# Patient Record
Sex: Male | Born: 1990 | Race: Black or African American | Hispanic: No | Marital: Single | Smoking: Current every day smoker
Health system: Southern US, Community
[De-identification: ages and names within clinical notes are randomized; demographics above are authoritative.]

## PROBLEM LIST (undated history)

## (undated) DIAGNOSIS — J45909 Unspecified asthma, uncomplicated: Secondary | ICD-10-CM

## (undated) DIAGNOSIS — I219 Acute myocardial infarction, unspecified: Secondary | ICD-10-CM

## (undated) DIAGNOSIS — Z21 Asymptomatic human immunodeficiency virus [HIV] infection status: Secondary | ICD-10-CM

## (undated) DIAGNOSIS — N186 End stage renal disease: Secondary | ICD-10-CM

## (undated) DIAGNOSIS — I770 Arteriovenous fistula, acquired: Secondary | ICD-10-CM

## (undated) DIAGNOSIS — A4101 Sepsis due to Methicillin susceptible Staphylococcus aureus: Secondary | ICD-10-CM

## (undated) DIAGNOSIS — B20 Human immunodeficiency virus [HIV] disease: Secondary | ICD-10-CM

## (undated) DIAGNOSIS — I519 Heart disease, unspecified: Secondary | ICD-10-CM

## (undated) HISTORY — PX: PORT A CATH REVISION: SHX6033

---

## 2013-01-08 ENCOUNTER — Emergency Department (HOSPITAL_COMMUNITY)
Admission: EM | Admit: 2013-01-08 | Discharge: 2013-01-08 | Disposition: A | Discharge status: Home / Self Care | Payer: PRIVATE HEALTH INSURANCE | Attending: Emergency Medicine | Admitting: Emergency Medicine

## 2013-01-08 ENCOUNTER — Encounter (HOSPITAL_COMMUNITY): Payer: Self-pay | Admitting: *Deleted

## 2013-01-08 DIAGNOSIS — Z21 Asymptomatic human immunodeficiency virus [HIV] infection status: Secondary | ICD-10-CM

## 2013-01-08 DIAGNOSIS — H60399 Other infective otitis externa, unspecified ear: Secondary | ICD-10-CM | POA: Insufficient documentation

## 2013-01-08 DIAGNOSIS — B20 Human immunodeficiency virus [HIV] disease: Secondary | ICD-10-CM

## 2013-01-08 DIAGNOSIS — F17200 Nicotine dependence, unspecified, uncomplicated: Secondary | ICD-10-CM | POA: Insufficient documentation

## 2013-01-08 DIAGNOSIS — R21 Rash and other nonspecific skin eruption: Secondary | ICD-10-CM

## 2013-01-08 DIAGNOSIS — H6091 Unspecified otitis externa, right ear: Secondary | ICD-10-CM

## 2013-01-08 HISTORY — DX: Asymptomatic human immunodeficiency virus (hiv) infection status: Z21

## 2013-01-08 HISTORY — DX: Human immunodeficiency virus (HIV) disease: B20

## 2013-01-08 MED ORDER — PREDNISONE 20 MG PO TABS: ORAL_TABLET | ORAL | Status: DC

## 2013-01-08 MED ORDER — IBUPROFEN 800 MG PO TABS
800.0000 mg | ORAL_TABLET | Freq: Once | ORAL | Status: AC
  Administered 2013-01-08: 800 mg via ORAL
  Filled 2013-01-08: qty 1

## 2013-01-08 MED ORDER — ACETAMINOPHEN 500 MG PO TABS
1000.0000 mg | ORAL_TABLET | Freq: Once | ORAL | Status: AC
  Administered 2013-01-08: 1000 mg via ORAL
  Filled 2013-01-08: qty 2

## 2013-01-08 MED ORDER — METHYLPREDNISOLONE SODIUM SUCC 125 MG IJ SOLR
125.0000 mg | Freq: Once | INTRAMUSCULAR | Status: AC
  Administered 2013-01-08: 125 mg via INTRAMUSCULAR
  Filled 2013-01-08: qty 2

## 2013-01-08 MED ORDER — TRAMADOL HCL 50 MG PO TABS: 50.0000 mg | ORAL_TABLET | Freq: Four times a day (QID) | ORAL | Status: DC | PRN

## 2013-01-08 MED ORDER — NEOMYCIN-POLYMYXIN-HC 3.5-10000-1 OT SUSP: 4.0000 [drp] | Freq: Four times a day (QID) | OTIC | Status: DC

## 2013-01-08 NOTE — ED Notes (Signed)
Rash over body for the past 2 months, states it is painful

## 2013-01-08 NOTE — ED Notes (Signed)
Pt gave permission for this nurse to make staff at [facility] facility ([facility])  Aware of his HIV infection and that pt would need to be isolated from other prisoners etc, and that staff would need to use personal protection and good handwashing etc.  Dave Estrada was informed of same and requested that we would call the nurse in the morning to inform of same.  Nurse to be contacted is Ms. Edwards at [phone]

## 2013-01-08 NOTE — ED Provider Notes (Addendum)
History     This chart was scribed for Donnetta Hutching, MD, MD by Smitty Pluck, ED Scribe. The patient was seen in room APA04/APA04 and the patient's care was started at 7:26 PM.   CSN: [id number]  Arrival date & time 01/08/13  1844      Chief Complaint  Patient presents with  . Rash    The history is provided by the patient. No language interpreter was used.   Dave Estrada is a 22 y.o. male with hx of in utero HIV who presents to the Emergency Department [facility]IB correctional office from [facility] facility complaining of constant, moderate rash over entire body onset 3 months ago. Symptoms are worsening. He states that he has not taken his HIV medication in a long time (2 years) due to not having insurance and he has not informed facility that he has HIV. Pt states the rash persistently itches and hurts. Pt denies fever, chills, nausea, vomiting, diarrhea, weakness, cough, SOB and any other pain.   History reviewed. No pertinent past medical history.  History reviewed. No pertinent past surgical history.  No family history on file.  History  Substance Use Topics  . Smoking status: Current Every Day Smoker -- 1.00 packs/day  . Smokeless tobacco: Not on file  . Alcohol Use: No      Review of Systems 10 Systems reviewed and all are negative for acute change except as noted in the HPI.   Allergies  Review of patient's allergies indicates not on file.  Home Medications  No current outpatient prescriptions on file.  BP 150/95  Pulse 100  Temp(Src) 97.7 F (36.5 C) (Oral)  Resp 24  Ht 5' 5.5" (1.664 m)  Wt 120 lb (54.432 kg)  BMI 19.66 kg/m2  SpO2 96%  Physical Exam  Nursing note and vitals reviewed. Constitutional: He is oriented to person, place, and time. He appears well-developed and well-nourished.  HENT:  Head: Normocephalic and atraumatic.  Right external canal erythematous and  Inflamed  Eyes: Conjunctivae and EOM are normal. Pupils are equal,  round, and reactive to light.  Neck: Normal range of motion. Neck supple.  Cardiovascular: Normal rate, regular rhythm and normal heart sounds.   Pulmonary/Chest: Effort normal and breath sounds normal.  Abdominal: Soft. Bowel sounds are normal.  Musculoskeletal: Normal range of motion.  Neurological: He is alert and oriented to person, place, and time.  Skin: Skin is warm.   Diffuse erythematous papular plaque with each lesion is 1-10 mm in diameter over entire body  Psychiatric: He has a normal mood and affect.    ED Course  Procedures (including critical care time) DIAGNOSTIC STUDIES: Oxygen Saturation is 96% on room air, adequate by my interpretation.    COORDINATION OF CARE: 7:30 PM Discussed ED treatment with pt and pt agrees.     Labs Reviewed - No data to display No results found.   No diagnosis found.    MDM  Concern for Kaposi's sarcoma.  Discussed with infectious disease consultant at [facility].  Follow up number given.   Prescription for prednisone.  Also Rx for tramadol #30 and Cortisporin Otic for right ear      I personally performed the services described in this documentation, which was scribed in my presence. The recorded information has been reviewed and is accurate.    Donnetta Hutching, MD 01/08/13 4098  Donnetta Hutching, MD 01/08/13 2233

## 2013-01-09 NOTE — ED Notes (Signed)
RN spoke with Marybelle Killings RN at [facility] staff are aware and pt has been placed in isolation.

## 2013-01-09 NOTE — ED Notes (Signed)
Unable to contact nurse Edwards at [phone] (disconnected). Message left with communications [phone] for jail nurse to contact charge nurse at [phone].

## 2014-09-08 ENCOUNTER — Emergency Department (HOSPITAL_COMMUNITY): Payer: Medicaid Other

## 2014-09-08 ENCOUNTER — Inpatient Hospital Stay (HOSPITAL_COMMUNITY)
Admission: EM | Admit: 2014-09-08 | Discharge: 2014-09-15 | DRG: 974 | Disposition: A | Discharge status: Home / Self Care | Payer: Medicaid Other | Attending: Internal Medicine | Admitting: Internal Medicine

## 2014-09-08 ENCOUNTER — Encounter (HOSPITAL_COMMUNITY): Payer: Self-pay | Admitting: Emergency Medicine

## 2014-09-08 DIAGNOSIS — F1290 Cannabis use, unspecified, uncomplicated: Secondary | ICD-10-CM | POA: Diagnosis present

## 2014-09-08 DIAGNOSIS — D649 Anemia, unspecified: Secondary | ICD-10-CM | POA: Diagnosis not present

## 2014-09-08 DIAGNOSIS — A4102 Sepsis due to Methicillin resistant Staphylococcus aureus: Secondary | ICD-10-CM | POA: Diagnosis present

## 2014-09-08 DIAGNOSIS — Z21 Asymptomatic human immunodeficiency virus [HIV] infection status: Secondary | ICD-10-CM | POA: Diagnosis present

## 2014-09-08 DIAGNOSIS — A4101 Sepsis due to Methicillin susceptible Staphylococcus aureus: Secondary | ICD-10-CM | POA: Diagnosis present

## 2014-09-08 DIAGNOSIS — N186 End stage renal disease: Secondary | ICD-10-CM | POA: Diagnosis not present

## 2014-09-08 DIAGNOSIS — Z79899 Other long term (current) drug therapy: Secondary | ICD-10-CM | POA: Diagnosis not present

## 2014-09-08 DIAGNOSIS — B9562 Methicillin resistant Staphylococcus aureus infection as the cause of diseases classified elsewhere: Secondary | ICD-10-CM

## 2014-09-08 DIAGNOSIS — R509 Fever, unspecified: Secondary | ICD-10-CM

## 2014-09-08 DIAGNOSIS — D539 Nutritional anemia, unspecified: Secondary | ICD-10-CM | POA: Diagnosis present

## 2014-09-08 DIAGNOSIS — N185 Chronic kidney disease, stage 5: Secondary | ICD-10-CM | POA: Diagnosis not present

## 2014-09-08 DIAGNOSIS — I38 Endocarditis, valve unspecified: Secondary | ICD-10-CM | POA: Diagnosis present

## 2014-09-08 DIAGNOSIS — Z8614 Personal history of Methicillin resistant Staphylococcus aureus infection: Secondary | ICD-10-CM

## 2014-09-08 DIAGNOSIS — B999 Unspecified infectious disease: Secondary | ICD-10-CM

## 2014-09-08 DIAGNOSIS — I369 Nonrheumatic tricuspid valve disorder, unspecified: Secondary | ICD-10-CM | POA: Diagnosis not present

## 2014-09-08 DIAGNOSIS — N189 Chronic kidney disease, unspecified: Secondary | ICD-10-CM

## 2014-09-08 DIAGNOSIS — I120 Hypertensive chronic kidney disease with stage 5 chronic kidney disease or end stage renal disease: Secondary | ICD-10-CM | POA: Diagnosis present

## 2014-09-08 DIAGNOSIS — J45909 Unspecified asthma, uncomplicated: Secondary | ICD-10-CM | POA: Diagnosis present

## 2014-09-08 DIAGNOSIS — F17210 Nicotine dependence, cigarettes, uncomplicated: Secondary | ICD-10-CM | POA: Diagnosis present

## 2014-09-08 DIAGNOSIS — R160 Hepatomegaly, not elsewhere classified: Secondary | ICD-10-CM

## 2014-09-08 DIAGNOSIS — R7881 Bacteremia: Secondary | ICD-10-CM

## 2014-09-08 DIAGNOSIS — K769 Liver disease, unspecified: Secondary | ICD-10-CM | POA: Diagnosis present

## 2014-09-08 DIAGNOSIS — Z888 Allergy status to other drugs, medicaments and biological substances status: Secondary | ICD-10-CM

## 2014-09-08 DIAGNOSIS — Z992 Dependence on renal dialysis: Secondary | ICD-10-CM | POA: Diagnosis not present

## 2014-09-08 DIAGNOSIS — I340 Nonrheumatic mitral (valve) insufficiency: Secondary | ICD-10-CM | POA: Diagnosis not present

## 2014-09-08 DIAGNOSIS — B20 Human immunodeficiency virus [HIV] disease: Secondary | ICD-10-CM | POA: Diagnosis not present

## 2014-09-08 DIAGNOSIS — I10 Essential (primary) hypertension: Secondary | ICD-10-CM | POA: Diagnosis not present

## 2014-09-08 HISTORY — DX: End stage renal disease: N18.6

## 2014-09-08 LAB — URINALYSIS, ROUTINE W REFLEX MICROSCOPIC
Bilirubin Urine: NEGATIVE
Glucose, UA: NEGATIVE mg/dL
KETONES UR: NEGATIVE mg/dL
LEUKOCYTES UA: NEGATIVE
NITRITE: NEGATIVE
PH: 6.5 (ref 5.0–8.0)
PROTEIN: 100 mg/dL — AB
Specific Gravity, Urine: 1.01 (ref 1.005–1.030)
UROBILINOGEN UA: 0.2 mg/dL (ref 0.0–1.0)

## 2014-09-08 LAB — COMPREHENSIVE METABOLIC PANEL
ALT: 9 U/L (ref 0–53)
ANION GAP: 16 — AB (ref 5–15)
AST: 20 U/L (ref 0–37)
Albumin: 3.6 g/dL (ref 3.5–5.2)
Alkaline Phosphatase: 106 U/L (ref 39–117)
BUN: 30 mg/dL — ABNORMAL HIGH (ref 6–23)
CALCIUM: 8.5 mg/dL (ref 8.4–10.5)
CO2: 26 meq/L (ref 19–32)
CREATININE: 7.68 mg/dL — AB (ref 0.50–1.35)
Chloride: 93 mEq/L — ABNORMAL LOW (ref 96–112)
GFR calc Af Amer: 10 mL/min — ABNORMAL LOW (ref >90)
GFR, EST NON AFRICAN AMERICAN: 9 mL/min — AB (ref >90)
Glucose, Bld: 123 mg/dL — ABNORMAL HIGH (ref 70–99)
Potassium: 3.7 mEq/L (ref 3.7–5.3)
Sodium: 135 mEq/L — ABNORMAL LOW (ref 137–147)
Total Bilirubin: 1 mg/dL (ref 0.3–1.2)
Total Protein: 8.3 g/dL (ref 6.0–8.3)

## 2014-09-08 LAB — CBC WITH DIFFERENTIAL/PLATELET
BASOS ABS: 0 10*3/uL (ref 0.0–0.1)
BASOS PCT: 0 % (ref 0–1)
EOS PCT: 1 % (ref 0–5)
Eosinophils Absolute: 0 10*3/uL (ref 0.0–0.7)
HEMATOCRIT: 28.7 % — AB (ref 39.0–52.0)
Hemoglobin: 9.9 g/dL — ABNORMAL LOW (ref 13.0–17.0)
LYMPHS PCT: 7 % — AB (ref 12–46)
Lymphs Abs: 0.6 10*3/uL — ABNORMAL LOW (ref 0.7–4.0)
MCH: 35.2 pg — ABNORMAL HIGH (ref 26.0–34.0)
MCHC: 34.5 g/dL (ref 30.0–36.0)
MCV: 102.1 fL — AB (ref 78.0–100.0)
MONO ABS: 0.5 10*3/uL (ref 0.1–1.0)
Monocytes Relative: 7 % (ref 3–12)
Neutro Abs: 7.2 10*3/uL (ref 1.7–7.7)
Neutrophils Relative %: 85 % — ABNORMAL HIGH (ref 43–77)
Platelets: 129 10*3/uL — ABNORMAL LOW (ref 150–400)
RBC: 2.81 MIL/uL — ABNORMAL LOW (ref 4.22–5.81)
RDW: 14.4 % (ref 11.5–15.5)
WBC: 8.3 10*3/uL (ref 4.0–10.5)

## 2014-09-08 LAB — URINE MICROSCOPIC-ADD ON

## 2014-09-08 LAB — LACTIC ACID, PLASMA: LACTIC ACID, VENOUS: 1.3 mmol/L (ref 0.5–2.2)

## 2014-09-08 MED ORDER — ENOXAPARIN SODIUM 40 MG/0.4ML ~~LOC~~ SOLN: 40.0000 mg | SUBCUTANEOUS | Status: DC

## 2014-09-08 MED ORDER — SODIUM CHLORIDE 0.9 % IV BOLUS (SEPSIS)
500.0000 mL | Freq: Once | INTRAVENOUS | Status: AC
  Administered 2014-09-08: 500 mL via INTRAVENOUS

## 2014-09-08 MED ORDER — ACETAMINOPHEN 650 MG RE SUPP: 650.0000 mg | Freq: Four times a day (QID) | RECTAL | Status: DC | PRN

## 2014-09-08 MED ORDER — TENOFOVIR DISOPROXIL FUMARATE 300 MG PO TABS
300.0000 mg | ORAL_TABLET | ORAL | Status: DC
  Filled 2014-09-08: qty 1

## 2014-09-08 MED ORDER — ETRAVIRINE 100 MG PO TABS
200.0000 mg | ORAL_TABLET | Freq: Two times a day (BID) | ORAL | Status: DC
  Administered 2014-09-08 – 2014-09-12 (×8): 200 mg via ORAL
  Filled 2014-09-08 (×16): qty 2

## 2014-09-08 MED ORDER — RALTEGRAVIR POTASSIUM 400 MG PO TABS
400.0000 mg | ORAL_TABLET | Freq: Two times a day (BID) | ORAL | Status: DC
  Administered 2014-09-08 – 2014-09-13 (×11): 400 mg via ORAL
  Filled 2014-09-08 (×18): qty 1

## 2014-09-08 MED ORDER — SODIUM CHLORIDE 0.9 % IV SOLN
INTRAVENOUS | Status: DC
  Administered 2014-09-08 – 2014-09-09 (×2): via INTRAVENOUS

## 2014-09-08 MED ORDER — EMTRICITABINE 200 MG PO CAPS
200.0000 mg | ORAL_CAPSULE | ORAL | Status: DC
  Administered 2014-09-11: 200 mg via ORAL
  Filled 2014-09-08 (×3): qty 1

## 2014-09-08 MED ORDER — PIPERACILLIN-TAZOBACTAM 3.375 G IVPB 30 MIN
3.3750 g | Freq: Once | INTRAVENOUS | Status: AC
  Administered 2014-09-08: 3.375 g via INTRAVENOUS
  Filled 2014-09-08: qty 50

## 2014-09-08 MED ORDER — PIPERACILLIN-TAZOBACTAM IN DEX 2-0.25 GM/50ML IV SOLN
INTRAVENOUS | Status: AC
  Filled 2014-09-08: qty 50

## 2014-09-08 MED ORDER — PIPERACILLIN-TAZOBACTAM IN DEX 2-0.25 GM/50ML IV SOLN
2.2500 g | Freq: Three times a day (TID) | INTRAVENOUS | Status: DC
  Administered 2014-09-09 – 2014-09-10 (×4): 2.25 g via INTRAVENOUS
  Filled 2014-09-08 (×8): qty 50

## 2014-09-08 MED ORDER — IBUPROFEN 400 MG PO TABS
400.0000 mg | ORAL_TABLET | Freq: Once | ORAL | Status: AC
  Administered 2014-09-08: 400 mg via ORAL
  Filled 2014-09-08: qty 1

## 2014-09-08 MED ORDER — ACETAMINOPHEN 325 MG PO TABS
325.0000 mg | ORAL_TABLET | Freq: Once | ORAL | Status: AC
  Administered 2014-09-08: 325 mg via ORAL
  Filled 2014-09-08: qty 1

## 2014-09-08 MED ORDER — ACETAMINOPHEN 325 MG PO TABS
650.0000 mg | ORAL_TABLET | Freq: Once | ORAL | Status: AC
  Administered 2014-09-08: 650 mg via ORAL
  Filled 2014-09-08: qty 2

## 2014-09-08 MED ORDER — VANCOMYCIN HCL IN DEXTROSE 1-5 GM/200ML-% IV SOLN
1000.0000 mg | Freq: Once | INTRAVENOUS | Status: AC
  Administered 2014-09-08: 1000 mg via INTRAVENOUS
  Filled 2014-09-08: qty 200

## 2014-09-08 MED ORDER — ONDANSETRON HCL 4 MG/2ML IJ SOLN: 4.0000 mg | Freq: Four times a day (QID) | INTRAMUSCULAR | Status: DC | PRN

## 2014-09-08 MED ORDER — ACETAMINOPHEN 325 MG PO TABS
650.0000 mg | ORAL_TABLET | Freq: Four times a day (QID) | ORAL | Status: DC | PRN
  Administered 2014-09-09: 650 mg via ORAL
  Filled 2014-09-08: qty 2

## 2014-09-08 MED ORDER — HEPARIN SODIUM (PORCINE) 5000 UNIT/ML IJ SOLN
5000.0000 [IU] | Freq: Three times a day (TID) | INTRAMUSCULAR | Status: DC
  Filled 2014-09-08 (×14): qty 1

## 2014-09-08 MED ORDER — ONDANSETRON HCL 4 MG PO TABS: 4.0000 mg | ORAL_TABLET | Freq: Four times a day (QID) | ORAL | Status: DC | PRN

## 2014-09-08 NOTE — ED Provider Notes (Signed)
CSN: [id number]     Arrival date & time 09/08/14  1502 History   First MD Initiated Contact with Patient 09/08/14 1552     Chief Complaint  Patient presents with  . Fever     (Consider location/radiation/quality/duration/timing/severity/associated sxs/prior Treatment) HPI  This is a 23 year old male with history of HIV, end-stage renal disease on dialysis Tuesday, Thursday, and Saturday in the [facility] who presents with fever. Patient reports a 2 day history of fever. He states that at dialysis's fever was 103. He last dialyzed this morning. He did not receive a full dialysis session and only got 2 hours worth. He reports associated cough, myalgias, and headache. He denies any neck pain or stiffness. He denies any abdominal pain, vomiting, or diarrhea. He states that his dad has had a cough. He has been taking over-the-counter NyQuil, TheraFlu, and Tylenol with minimal relief. He dialyzes through a tunneled catheter.  Past Medical History  Diagnosis Date  . HIV (human immunodeficiency virus infection)   . Renal disorder    Past Surgical History  Procedure Laterality Date  . Port a cath     Family History  Problem Relation Age of Onset  . Seizures Father    History  Substance Use Topics  . Smoking status: Current Every Day Smoker -- 1.00 packs/day for 11 years    Types: Cigarettes  . Smokeless tobacco: Never Used  . Alcohol Use: No    Review of Systems  Constitutional: Positive for fever and chills.  Respiratory: Positive for cough. Negative for chest tightness and shortness of breath.   Cardiovascular: Negative.  Negative for chest pain.  Gastrointestinal: Negative.  Negative for nausea, vomiting, abdominal pain and diarrhea.  Genitourinary: Negative.  Negative for dysuria.  Musculoskeletal: Positive for myalgias. Negative for neck pain and neck stiffness.  Skin: Negative for rash.  Neurological: Negative for headaches.  All other systems reviewed and are  negative.     Allergies  Aspirin  Home Medications   Prior to Admission medications   Medication Sig Start Date End Date Taking? Authorizing Provider  emtricitabine (EMTRIVA) 200 MG capsule Take 200 mg by mouth 2 (two) times a week. Taken after dialysis on Mondays and Fridays   Yes Historical Provider, MD  etravirine (INTELENCE) 100 MG tablet Take 200 mg by mouth every 12 (twelve) hours.   Yes Historical Provider, MD  raltegravir (ISENTRESS) 400 MG tablet Take 400 mg by mouth 2 (two) times daily.   Yes Historical Provider, MD  tenofovir (VIREAD) 300 MG tablet Take 300 mg by mouth every Monday. To be taken on Mondays after dialysis   Yes Historical Provider, MD  predniSONE (DELTASONE) 20 MG tablet 3 tabs po daily x 3 days, then 2 tabs x 3 days, then 1.5 tabs x 3 days, then 1 tab x 3 days, then 0.5 tabs x 3 days Patient not taking: Reported on 09/08/2014 01/08/13   Donnetta Hutching, MD  predniSONE (DELTASONE) 20 MG tablet 3 tabs po daily x 3 days, then 2 tabs x 3 days, then 1.5 tabs x 3 days, then 1 tab x 3 days, then 0.5 tabs x 3 days Patient not taking: Reported on 09/08/2014 01/08/13   Donnetta Hutching, MD  traMADol (ULTRAM) 50 MG tablet Take 1 tablet (50 mg total) by mouth every 6 (six) hours as needed for pain. Patient not taking: Reported on 09/08/2014 01/08/13   Donnetta Hutching, MD   BP 116/69 mmHg  Pulse 129  Temp(Src) 99.4 F (37.4 C) (Oral)  Resp 19  Ht 5\' 5"  (1.651 m)  Wt 140 lb (63.504 kg)  BMI 23.30 kg/m2  SpO2 98% Physical Exam  Constitutional: He is oriented to person, place, and time. No distress.  Chronically ill-appearing, no acute distress  HENT:  Head: Normocephalic and atraumatic.  Mucous membranes dry  Eyes: Pupils are equal, round, and reactive to light.  Neck: Normal range of motion. Neck supple.  No meningismus  Cardiovascular: Regular rhythm and normal heart sounds.   No murmur heard. Tachycardia  Pulmonary/Chest: Effort normal and breath sounds normal. No respiratory  distress. He has no wheezes. He exhibits no tenderness.  Tunneled catheter in right upper chest, clean dry and intact without erythema  Abdominal: Soft. Bowel sounds are normal. There is no tenderness. There is no rebound.  Musculoskeletal: He exhibits no edema.  Lymphadenopathy:    He has no cervical adenopathy.  Neurological: He is alert and oriented to person, place, and time.  Skin: Skin is warm and dry.  Psychiatric: He has a normal mood and affect.  Nursing note and vitals reviewed.   ED Course  Procedures (including critical care time)  CRITICAL CARE Performed by: Ross MarcusHORTON, COURTNEY, F   Total critical care time: 35 min  Critical care time was exclusive of separately billable procedures and treating other patients.  Critical care was necessary to treat or prevent imminent or life-threatening deterioration.  Critical care was time spent personally by me on the following activities: development of treatment plan with patient and/or surrogate as well as nursing, discussions with consultants, evaluation of patient's response to treatment, examination of patient, obtaining history from patient or surrogate, ordering and performing treatments and interventions, ordering and review of laboratory studies, ordering and review of radiographic studies, pulse oximetry and re-evaluation of patient's condition.  Labs Review Labs Reviewed  CBC WITH DIFFERENTIAL - Abnormal; Notable for the following:    RBC 2.81 (*)    Hemoglobin 9.9 (*)    HCT 28.7 (*)    MCV 102.1 (*)    MCH 35.2 (*)    Platelets 129 (*)    Neutrophils Relative % 85 (*)    Lymphocytes Relative 7 (*)    Lymphs Abs 0.6 (*)    All other components within normal limits  COMPREHENSIVE METABOLIC PANEL - Abnormal; Notable for the following:    Sodium 135 (*)    Chloride 93 (*)    Glucose, Bld 123 (*)    BUN 30 (*)    Creatinine, Ser 7.68 (*)    GFR calc non Af Amer 9 (*)    GFR calc Af Amer 10 (*)    Anion gap 16 (*)     All other components within normal limits  URINALYSIS, ROUTINE W REFLEX MICROSCOPIC - Abnormal; Notable for the following:    Hgb urine dipstick SMALL (*)    Protein, ur 100 (*)    All other components within normal limits  URINE MICROSCOPIC-ADD ON - Abnormal; Notable for the following:    Squamous Epithelial / LPF FEW (*)    Bacteria, UA FEW (*)    Casts GRANULAR CAST (*)    All other components within normal limits  URINE CULTURE  CULTURE, BLOOD (ROUTINE X 2)  CULTURE, BLOOD (ROUTINE X 2)  LACTIC ACID, PLASMA  INFLUENZA PANEL BY PCR (TYPE A & B, H1N1)    Imaging Review Dg Chest 2 View  09/08/2014   CLINICAL DATA:  Fever for 2 days with cough  EXAM: CHEST  2  VIEW  COMPARISON:  None.  FINDINGS: Cardiac shadow is within normal limits. A right jugular dialysis catheter is seen in satisfactory position. The lungs are clear bilaterally. No effusion is noted. No acute bony abnormality is seen.  IMPRESSION: No acute abnormality noted.   Electronically Signed   By: Alcide Clever M.D.   On: 09/08/2014 17:13   Ct Head Wo Contrast  09/08/2014   CLINICAL DATA:  Headaches and fevers for 2 days  EXAM: CT HEAD WITHOUT CONTRAST  TECHNIQUE: Contiguous axial images were obtained from the base of the skull through the vertex without intravenous contrast.  COMPARISON:  None.  FINDINGS: Bony calvarium is intact. The paranasal sinuses show partial opacification in the maxillary antra as well as some thickening of the nasal passages on the left. May be related to acute sinusitis. No findings to suggest acute hemorrhage, acute infarction or space-occupying mass lesion are noted.  IMPRESSION: Changes in the maxillary antra and left sinus passages likely related to a degree of sinusitis. No other focal abnormality is noted.   Electronically Signed   By: Alcide Clever M.D.   On: 09/08/2014 18:16     EKG Interpretation None      MDM   Final diagnoses:  Fever  AIDS  End stage renal disease    Patient  presents with fever. History of AIDS and end-stage renal disease. Only reports cough, myalgias.  Tachycardic and febrile on exam. Otherwise vital signs are reassuring. Patient is nontoxic-appearing. Workup does not readily indicate a source. Cultures pending. Chest x-ray is clear and urinalysis does not look overtly infected. Tunneled catheter site no evidence of obvious infection. Patient was given Tylenol and Motrin with some improvement of his fever. He is remained tachycardic. He was given 500 mL of fluid given that he was on dialysis. Lactate is normal.  Patient is followed by ID at [facility], Dr. Janora Norlander.  If patient needs admission, patient prefers to be admitted here. Discussed with ID attending at [facility] workup and any further workup.  At this time given that the patient is awake, alert, oriented, not altered, LP does not need to be performed. Will admit patient for observation pending cultures. Will initiate broad-spectrum antibiotics including vancomycin and Zosyn. Influenza PCR sent.    Shon Baton, MD 09/08/14 Ernestina Columbia

## 2014-09-08 NOTE — ED Notes (Signed)
Patient c/o fever x2 days. Patient reports taking Theraflu and tylenol with no relief. Per patient highest temp 103. Patient denies any nausea, vomiting, or diarrhea. Does report occasional cough. Patient is a dialysis patient.

## 2014-09-08 NOTE — H&P (Signed)
PCP:   No primary care provider on file.   Chief Complaint:  Fever  HPI:  23 year old male who  has a past medical history of HIV (human immunodeficiency virus infection) and Renal disorder. Patient has end-stage renal disease on analysis Tuesday Thursday and Saturday. Patient presented today history of fever associated with nasal congestion, cough. Fever was high as 103. Patient was dialyzed this morning. He also has been having myalgias and headache. He denies neck pain. No nausea vomiting or diarrhea. Patient has been coughing up white colored phlegm. In the ED patient was found to have fever of 102, no other source of infection found chest x-ray is clear UA does not show abnormality. Patient also denies dysuria urgency or frequency of urination.  Allergies:   Allergies  Allergen Reactions  . Aspirin Other (See Comments)    Reaction is unknown      Past Medical History  Diagnosis Date  . HIV (human immunodeficiency virus infection)   . Renal disorder     Past Surgical History  Procedure Laterality Date  . Port a cath      Prior to Admission medications   Medication Sig Start Date End Date Taking? Authorizing Provider  emtricitabine (EMTRIVA) 200 MG capsule Take 200 mg by mouth 2 (two) times a week. Taken after dialysis on Mondays and Fridays   Yes Historical Provider, MD  etravirine (INTELENCE) 100 MG tablet Take 200 mg by mouth every 12 (twelve) hours.   Yes Historical Provider, MD  raltegravir (ISENTRESS) 400 MG tablet Take 400 mg by mouth 2 (two) times daily.   Yes Historical Provider, MD  tenofovir (VIREAD) 300 MG tablet Take 300 mg by mouth every Monday. To be taken on Mondays after dialysis   Yes Historical Provider, MD  predniSONE (DELTASONE) 20 MG tablet 3 tabs po daily x 3 days, then 2 tabs x 3 days, then 1.5 tabs x 3 days, then 1 tab x 3 days, then 0.5 tabs x 3 days Patient not taking: Reported on 09/08/2014 01/08/13   Donnetta Hutching, MD  predniSONE (DELTASONE) 20  MG tablet 3 tabs po daily x 3 days, then 2 tabs x 3 days, then 1.5 tabs x 3 days, then 1 tab x 3 days, then 0.5 tabs x 3 days Patient not taking: Reported on 09/08/2014 01/08/13   Donnetta Hutching, MD  traMADol (ULTRAM) 50 MG tablet Take 1 tablet (50 mg total) by mouth every 6 (six) hours as needed for pain. Patient not taking: Reported on 09/08/2014 01/08/13   Donnetta Hutching, MD    Social History:  reports that he has been smoking Cigarettes.  He has a 11 pack-year smoking history. He has never used smokeless tobacco. He reports that he uses illicit drugs (Marijuana). He reports that he does not drink alcohol.  Family History  Problem Relation Age of Onset  . Seizures Father      All the positives are listed in BOLD  Review of Systems:  HEENT: Headache, blurred vision, runny nose, sore throat Neck: Hypothyroidism, hyperthyroidism,,lymphadenopathy Chest : Shortness of breath, history of COPD, Asthma, cough Heart : Chest pain, history of coronary arterey disease GI:  Nausea, vomiting, diarrhea, constipation, GERD GU: Dysuria, urgency, frequency of urination, hematuria Neuro: Stroke, seizures, syncope Psych: Depression, anxiety, hallucinations   Physical Exam: Blood pressure 105/73, pulse 113, temperature 99.4 F (37.4 C), temperature source Oral, resp. rate 21, height 5\' 5"  (1.651 m), weight 63.504 kg (140 lb), SpO2 100 %. Constitutional:   Patient  is a well-developed and well-nourished male in no acute distress and cooperative with exam. Head: Normocephalic and atraumatic Mouth: Mucus membranes moist Eyes: PERRL, EOMI, conjunctivae normal Neck: Supple, No Thyromegaly Cardiovascular: RRR, S1 normal, S2 normal Pulmonary/Chest: CTAB, no wheezes, rales, or rhonchi Abdominal: Soft. Non-tender, non-distended, bowel sounds are normal, no masses, organomegaly, or guarding present.  Neurological: A&O x3, Strength is normal and symmetric bilaterally, cranial nerve II-XII are grossly intact, no focal  motor deficit, sensory intact to light touch bilaterally.  Extremities : No Cyanosis, Clubbing or Edema  Labs on Admission:  Basic Metabolic Panel:  Recent Labs Lab 09/08/14 1553  NA 135*  K 3.7  CL 93*  CO2 26  GLUCOSE 123*  BUN 30*  CREATININE 7.68*  CALCIUM 8.5   Liver Function Tests:  Recent Labs Lab 09/08/14 1553  AST 20  ALT 9  ALKPHOS 106  BILITOT 1.0  PROT 8.3  ALBUMIN 3.6   No results for input(s): LIPASE, AMYLASE in the last 168 hours. No results for input(s): AMMONIA in the last 168 hours. CBC:  Recent Labs Lab 09/08/14 1553  WBC 8.3  NEUTROABS 7.2  HGB 9.9*  HCT 28.7*  MCV 102.1*  PLT 129*   Cardiac Enzymes: No results for input(s): CKTOTAL, CKMB, CKMBINDEX, TROPONINI in the last 168 hours.  BNP (last 3 results) No results for input(s): PROBNP in the last 8760 hours. CBG: No results for input(s): GLUCAP in the last 168 hours.  Radiological Exams on Admission: Dg Chest 2 View  09/08/2014   CLINICAL DATA:  Fever for 2 days with cough  EXAM: CHEST  2 VIEW  COMPARISON:  None.  FINDINGS: Cardiac shadow is within normal limits. A right jugular dialysis catheter is seen in satisfactory position. The lungs are clear bilaterally. No effusion is noted. No acute bony abnormality is seen.  IMPRESSION: No acute abnormality noted.   Electronically Signed   By: Alcide Clever M.D.   On: 09/08/2014 17:13   Ct Head Wo Contrast  09/08/2014   CLINICAL DATA:  Headaches and fevers for 2 days  EXAM: CT HEAD WITHOUT CONTRAST  TECHNIQUE: Contiguous axial images were obtained from the base of the skull through the vertex without intravenous contrast.  COMPARISON:  None.  FINDINGS: Bony calvarium is intact. The paranasal sinuses show partial opacification in the maxillary antra as well as some thickening of the nasal passages on the left. May be related to acute sinusitis. No findings to suggest acute hemorrhage, acute infarction or space-occupying mass lesion are noted.   IMPRESSION: Changes in the maxillary antra and left sinus passages likely related to a degree of sinusitis. No other focal abnormality is noted.   Electronically Signed   By: Alcide Clever M.D.   On: 09/08/2014 18:16     Assessment/Plan Active Problems:   Fever   HIV (human immunodeficiency virus infection)   ESRD on hemodialysis  Fever Will admit the patient in telemetry. Start vancomycin and Zosyn per pharmacy consultation. Blood cultures and urine cultures have been obtained. If cultures are negative consider switching antibiotics to oral formulation. Considering upper respiratory symptoms it appears that patient may have upper respiratory viral infection.  ESRD on hemodialysis We'll consult nephrology in the morning for dialysis. Patient is on dialysis Tuesday Thursday and Saturday.  HIV Patient is followed by infectious disease at [facility]. Continue antiretroviral therapy.  DVT prophylaxis Lovenox  Code status: Full code  Family discussion: Admission, patients condition and plan of care including tests being  ordered have been discussed with the patient and *his wife at bedside* who indicate understanding and agree with the plan and Code Status.   Time Spent on Admission: 60 minutes  Venesa Semidey S [facility] Pager: [phone] 09/08/2014, 8:19 PM  If 7PM-7AM, please contact night-coverage  [email or web address]  Password [email or web address]

## 2014-09-08 NOTE — Progress Notes (Signed)
ANTIBIOTIC CONSULT NOTE  Pharmacy Consult for Vancomycin & Zosyn Indication: sepsis  Allergies  Allergen Reactions  . Aspirin Other (See Comments)    Reaction is unknown    Patient Measurements: Height: 5\' 5"  (165.1 cm) Weight: 140 lb (63.504 kg) IBW/kg (Calculated) : 61.5  Vital Signs: Temp: 99.4 F (37.4 C) (11/24 1851) Temp Source: Oral (11/24 1851) BP: 111/67 mmHg (11/24 2030) Pulse Rate: 107 (11/24 2030) Intake/Output from previous day:   Intake/Output from this shift:    Labs:  Recent Labs  09/08/14 1553  WBC 8.3  HGB 9.9*  PLT 129*  CREATININE 7.68*   Estimated Creatinine Clearance: 12.8 mL/min (by C-G formula based on Cr of 7.68). No results for input(s): VANCOTROUGH, VANCOPEAK, VANCORANDOM, GENTTROUGH, GENTPEAK, GENTRANDOM, TOBRATROUGH, TOBRAPEAK, TOBRARND, AMIKACINPEAK, AMIKACINTROU, AMIKACIN in the last 72 hours.   Microbiology: No results found for this or any previous visit (from the past 720 hour(s)).  Anti-infectives    Start     Dose/Rate Route Frequency Ordered Stop   09/08/14 1915  vancomycin (VANCOCIN) IVPB 1000 mg/200 mL premix     1,000 mg200 mL/hr over 60 Minutes Intravenous  Once 09/08/14 1911     09/08/14 1915  piperacillin-tazobactam (ZOSYN) IVPB 3.375 g     3.375 g100 mL/hr over 30 Minutes Intravenous  Once 09/08/14 1911 09/08/14 1951      Assessment: 23 yoM with hx HIV and ESRD requiring HD on TTS presented today with fever (Tm 103F at dialysis this morning).  Urinalysis & CXR negative.  WBC and lactic acid level are normal.   Broad-spectrum antibiotics were initiated in ED for respiratory symptoms in immunocompromised patient.   Vancomycin 11/24>> Zosyn 11/24>>  Goal of Therapy:  Pre-HD level 15-25 mcg/ml  Plan:  Zosyn 2.25gm IV q8h Vancomycin 750mg  IV qHD Check pre-HD level at steady-state F/U cx data Duration of therapy per MD- de-escalate as appropriate  Elson Clan 09/08/2014,8:49 PM

## 2014-09-09 DIAGNOSIS — I369 Nonrheumatic tricuspid valve disorder, unspecified: Secondary | ICD-10-CM

## 2014-09-09 DIAGNOSIS — A4101 Sepsis due to Methicillin susceptible Staphylococcus aureus: Secondary | ICD-10-CM

## 2014-09-09 HISTORY — DX: Sepsis due to methicillin susceptible Staphylococcus aureus: A41.01

## 2014-09-09 LAB — CBC
HEMATOCRIT: 27.6 % — AB (ref 39.0–52.0)
Hemoglobin: 9.4 g/dL — ABNORMAL LOW (ref 13.0–17.0)
MCH: 34.9 pg — AB (ref 26.0–34.0)
MCHC: 34.1 g/dL (ref 30.0–36.0)
MCV: 102.6 fL — ABNORMAL HIGH (ref 78.0–100.0)
Platelets: 126 10*3/uL — ABNORMAL LOW (ref 150–400)
RBC: 2.69 MIL/uL — ABNORMAL LOW (ref 4.22–5.81)
RDW: 14.8 % (ref 11.5–15.5)
WBC: 6 10*3/uL (ref 4.0–10.5)

## 2014-09-09 LAB — COMPREHENSIVE METABOLIC PANEL
ALBUMIN: 3.2 g/dL — AB (ref 3.5–5.2)
ALT: 11 U/L (ref 0–53)
AST: 19 U/L (ref 0–37)
Alkaline Phosphatase: 99 U/L (ref 39–117)
Anion gap: 14 (ref 5–15)
BUN: 35 mg/dL — AB (ref 6–23)
CALCIUM: 7.8 mg/dL — AB (ref 8.4–10.5)
CO2: 25 mEq/L (ref 19–32)
Chloride: 99 mEq/L (ref 96–112)
Creatinine, Ser: 8.41 mg/dL — ABNORMAL HIGH (ref 0.50–1.35)
GFR calc non Af Amer: 8 mL/min — ABNORMAL LOW (ref >90)
GFR, EST AFRICAN AMERICAN: 9 mL/min — AB (ref >90)
GLUCOSE: 97 mg/dL (ref 70–99)
Potassium: 4 mEq/L (ref 3.7–5.3)
Sodium: 138 mEq/L (ref 137–147)
TOTAL PROTEIN: 7.5 g/dL (ref 6.0–8.3)
Total Bilirubin: 0.9 mg/dL (ref 0.3–1.2)

## 2014-09-09 LAB — INFLUENZA PANEL BY PCR (TYPE A & B)
H1N1 flu by pcr: NOT DETECTED
Influenza A By PCR: NEGATIVE
Influenza B By PCR: NEGATIVE

## 2014-09-09 LAB — URINE CULTURE: Colony Count: 30000

## 2014-09-09 MED ORDER — VANCOMYCIN HCL IN DEXTROSE 750-5 MG/150ML-% IV SOLN
750.0000 mg | INTRAVENOUS | Status: DC
  Administered 2014-09-10 – 2014-09-12 (×2): 750 mg via INTRAVENOUS
  Filled 2014-09-09 (×7): qty 150

## 2014-09-09 MED ORDER — EPOETIN ALFA 4000 UNIT/ML IJ SOLN
4000.0000 [IU] | INTRAMUSCULAR | Status: DC
Start: 2014-09-10 — End: 2014-09-10

## 2014-09-09 NOTE — Progress Notes (Signed)
Patient's HR increases to 140's while ambulating.  Patient is asymptomatic.  Notified MD.  Will continue to monitor.

## 2014-09-09 NOTE — Care Management Note (Signed)
    Page 1 of 1   09/09/2014     10:51:19 AM CARE MANAGEMENT NOTE 09/09/2014  Patient:  Dave Estrada, Dave Estrada   Account Number:  [id number]  Date Initiated:  09/09/2014  Documentation initiated by:  Kathyrn Sheriff  Subjective/Objective Assessment:   Pt from home.     Action/Plan:   Plan to return home at discharge. No CM needs identified at this time.   Anticipated DC Date:  09/12/2014   Anticipated DC Plan:  HOME/SELF CARE      DC Planning Services  CM consult      Choice offered to / List presented to:             Status of service:  Completed, signed off Medicare Important Message given?   (If response is "NO", the following Medicare IM given date fields will be blank) Date Medicare IM given:   Medicare IM given by:   Date Additional Medicare IM given:   Additional Medicare IM given by:    Discharge Disposition:  HOME/SELF CARE  Per UR Regulation:    If discussed at Long Length of Stay Meetings, dates discussed:    Comments:  09/09/2014 1050 Kathyrn Sheriff, RN, MSN, [facility]

## 2014-09-09 NOTE — Clinical Documentation Improvement (Signed)
Please clarify if HIV positive can be further specified as one of the diagnoses listed below and document in pn or d/c summary.   Possible Clinical Conditions?  AIDS HIV Disease AIDS Related Complex HIV (+) unsymptomatic Symptomatic HIV  Other Condition Cannot Clinically Determine  Supporting Information: Risk Factors: Sepsis, HIV positive, ESRD, dialysis, Fever  Supporting Information: Risk Factors: Signs & Symptoms: Diagnostics:  Treatment  raltegravir   etravirine   emtricitabine   tenofovir  Thank You, Enis Slipper ,RN Clinical Documentation Specialist:  [phone]  [facility]- Health Information Management

## 2014-09-09 NOTE — Progress Notes (Signed)
Patient's VTE is heparin. He refused and was educated. Bertram Denver D, RN

## 2014-09-09 NOTE — Progress Notes (Signed)
  Echocardiogram 2D Echocardiogram has been performed.  Lajune Perine 09/09/2014, 3:16 PM

## 2014-09-09 NOTE — Care Management Utilization Note (Signed)
UR review complete.  

## 2014-09-09 NOTE — Consult Note (Signed)
Dave Estrada MRN: [id number] DOB/AGE: 1990/12/01 23 y.o. Primary Care Physician:No primary care provider on file. Admit date: 09/08/2014 Chief Complaint:  Chief Complaint  Patient presents with  . Fever   HPI: Pt is 23 year old male with past medical hx of ESRD who presented to er yesterday with c/o fever.  HPI dates back to 2-3 days ago when pt started having cough, productive with whitish phlegm. Pt  c/o fever which is now better Pt also having myalgia and headaches, now better. Pt presented to ER and was admitted for further care. Pt seen today at 3rd floor. NO c/o chest pain No c/o dyspnea  no c/o nausea/ vomiting No c/o abdominal pain No c/o syncope No c/o recent travel Pt last Dialysis was yesterday.   Past Medical History  Diagnosis Date  . HIV (human immunodeficiency virus infection)   . Renal disorder         Family History  Problem Relation Age of Onset  . Seizures Father     Social History:  reports that he has been smoking Cigarettes.  He has a 11 pack-year smoking history. He has never used smokeless tobacco. He reports that he uses illicit drugs (Marijuana). He reports that he does not drink alcohol.  Allergies:  Allergies  Allergen Reactions  . Aspirin Other (See Comments)    Reaction is unknown    Medications Prior to Admission  Medication Sig Dispense Refill  . emtricitabine (EMTRIVA) 200 MG capsule Take 200 mg by mouth 2 (two) times a week. Taken after dialysis on Mondays and Fridays    . etravirine (INTELENCE) 100 MG tablet Take 200 mg by mouth every 12 (twelve) hours.    . raltegravir (ISENTRESS) 400 MG tablet Take 400 mg by mouth 2 (two) times daily.    Marland Kitchen tenofovir (VIREAD) 300 MG tablet Take 300 mg by mouth every Monday. To be taken on Mondays after dialysis    . predniSONE (DELTASONE) 20 MG tablet 3 tabs po daily x 3 days, then 2 tabs x 3 days, then 1.5 tabs x 3 days, then 1 tab x 3 days, then 0.5 tabs x 3 days (Patient not taking:  Reported on 09/08/2014) 27 tablet 0  . predniSONE (DELTASONE) 20 MG tablet 3 tabs po daily x 3 days, then 2 tabs x 3 days, then 1.5 tabs x 3 days, then 1 tab x 3 days, then 0.5 tabs x 3 days (Patient not taking: Reported on 09/08/2014) 27 tablet 0  . traMADol (ULTRAM) 50 MG tablet Take 1 tablet (50 mg total) by mouth every 6 (six) hours as needed for pain. (Patient not taking: Reported on 09/08/2014) 30 tablet 0       UVO:ZDGUY from the symptoms mentioned above,there are no other symptoms referable to all systems reviewed.  Melene Muller ON 09/11/2014] emtricitabine  200 mg Oral Once per day on Mon Fri  . etravirine  200 mg Oral Q12H  . heparin subcutaneous  5,000 Units Subcutaneous 3 times per day  . piperacillin-tazobactam (ZOSYN)  IV  2.25 g Intravenous Q8H  . raltegravir  400 mg Oral BID  . [START ON 09/14/2014] tenofovir  300 mg Oral Q Mon     Physical Exam: Vital signs in last 24 hours: Temp:  [98 F (36.7 C)-101.8 F (38.8 C)] 98 F (36.7 C) (11/25 4034) Pulse Rate:  [96-140] 96 (11/25 0614) Resp:  [15-26] 21 (11/25 0614) BP: (104-130)/(56-78) 130/78 mmHg (11/25 0614) SpO2:  [96 %-100 %] 100 % (  11/25 4628) Weight:  [140 lb 6.4 oz (63.685 kg)-140 lb 10.5 oz (63.8 kg)] 140 lb 10.5 oz (63.8 kg) (11/25 6381) Weight change:  Last BM Date: 09/08/14  Intake/Output from previous day: 11/24 0701 - 11/25 0700 In: 395 [I.V.:345; IV Piggyback:50] Out: -  Total I/O In: 450 [I.V.:400; IV Piggyback:50] Out: -    Physical Exam: General- pt is awake,alert, oriented to time place and person Resp- No acute REsp distress, CTA B/L NO Rhonchi CVS- S1S2 regular in rate and rhythm GIT- BS+, soft, NT, ND EXT- NO LE Edema, Cyanosis CNS- CN 2-12 grossly intact. Moving all 4 extremities Psych- normal mood and affect Access- PC    Lab Results: CBC  Recent Labs  09/08/14 1553 09/09/14 0626  WBC 8.3 6.0  HGB 9.9* 9.4*  HCT 28.7* 27.6*  PLT 129* 126*    BMET  Recent Labs   09/08/14 1553 09/09/14 0626  NA 135* 138  K 3.7 4.0  CL 93* 99  CO2 26 25  GLUCOSE 123* 97  BUN 30* 35*  CREATININE 7.68* 8.41*  CALCIUM 8.5 7.8*    MICRO Recent Results (from the past 240 hour(s))  Blood culture (routine x 2)     Status: None (Preliminary result)   Collection Time: 09/08/14  4:30 PM  Result Value Ref Range Status   Specimen Description BLOOD RIGHT HAND  Final   Special Requests   Final    BOTTLES DRAWN AEROBIC AND ANAEROBIC AEB=10CC ANA=8CC   Culture  Setup Time   Final    09/09/2014 14:51 Performed at [facility]    Culture   Final    GRAM POSITIVE COCCI IN CLUSTERS Note: Gram Stain Report Called to,Read Back By and Verified With: [name] [phone] BY HUFFINES 09/09/14 Performed at [facility] Performed at [facility]    Report Status PENDING  Incomplete  Blood culture (routine x 2)     Status: None (Preliminary result)   Collection Time: 09/08/14  4:38 PM  Result Value Ref Range Status   Specimen Description BLOOD LEFT HAND  Final   Special Requests   Final    BOTTLES DRAWN AEROBIC AND ANAEROBIC AEB=14CC ANA=10CC   Culture  Setup Time   Final    09/09/2014 14:46 Performed at [facility]    Culture   Final    GRAM POSITIVE COCCI IN CLUSTERS Note: Gram Stain Report Called to,Read Back By and Verified With: [facility] S AT 0727 BY HUFFINES S 09/09/14 Performed at [facility] Performed at [facility]    Report Status PENDING  Incomplete      Lab Results  Component Value Date   CALCIUM 7.8* 09/09/2014      Impression: 1)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               On TTs schedule                No need of Hd today                Will dialyze in am                On PC                  Multiple AVF failed to mature  2)HTN BP stable   3)Anemia HGb at goal (9--11)   4)CKD Mineral-Bone Disorder Calcium 7.8 + 0.6=8.4  Near to goal when corrected for low albumin  5)ID- hx of HIV Admitted with  fevr Blood culture positive for G Positive cocci Final id pending Pt will ned PC replacement ( most likely ) On IV vanco + Zosyn  6)Electrolytes  Normokalemic NOrmonatremic  7)Acid base Co2 at goal     Plan:  NO need of Hd today Will dilayze in am Pt may ned pC exchange will keep on epo during HD       Yavuz Kirby S 09/09/2014, 3:44 PM

## 2014-09-09 NOTE — Plan of Care (Signed)
Problem: Phase I Progression Outcomes Goal: Voiding-avoid urinary catheter unless indicated Outcome: Completed/Met Date Met:  09/09/14

## 2014-09-09 NOTE — Progress Notes (Signed)
[facility] PROGRESS NOTE  Dave Estrada XBJ:[id number] DOB: [birth year] DOA: 09/08/2014 PCP: No primary care provider on file.  Assessment/Plan: 1. Gram pos bacteremia with sepsis  1. Thus far gm pos in clusters 2/2 in blood with gm pos in urine 2. Question HD cath as source, as this has been in place for over a year 3. No longer septic and pt reports feeling better today with IV abx. Will cont for now 4. Will order 2d echo for vegetations 5. Pending speciation and sensitivities of cultures 6. Consider HD cath removal - will defer to nephrology 2. ESRD on TTS HD 1. Nephrology consulted 3. HIV 1. [facility] 4. DVT prophylaxis 1. lovenox  Code Status: Full Family Communication: Pt in room Disposition Plan: Pending   Consultants:  Nephrology  Procedures:    Antibiotics:  Vancomycin 11/24>>>  Zosyn 11/24>>>  HPI/Subjective: Feels better today. Eager to go home.  Objective: Filed Vitals:   09/08/14 2030 09/08/14 2100 09/08/14 2222 09/09/14 0614  BP: 111/67 104/63 118/63 130/78  Pulse: 107 105 97 96  Temp:   98.4 F (36.9 C) 98 F (36.7 C)  TempSrc:   Oral Oral  Resp: 26 25 20 21   Height:   5\' 5"  (1.651 m)   Weight:   63.685 kg (140 lb 6.4 oz) 63.8 kg (140 lb 10.5 oz)  SpO2: 98% 99% 100% 100%    Intake/Output Summary (Last 24 hours) at 09/09/14 1237 Last data filed at 09/09/14 0500  Gross per 24 hour  Intake    395 ml  Output      0 ml  Net    395 ml   Filed Weights   09/08/14 1510 09/08/14 2222 09/09/14 0614  Weight: 63.504 kg (140 lb) 63.685 kg (140 lb 6.4 oz) 63.8 kg (140 lb 10.5 oz)    Exam:   General:  Awake, in nad  Cardiovascular: regular, s1, s2  Respiratory: normal resp effort, no wheezing  Abdomen: soft, nondistended  Musculoskeletal: perfused, no clubbing   Data Reviewed: Basic Metabolic Panel:  Recent Labs Lab 09/08/14 1553 09/09/14 0626  NA 135* 138  K 3.7 4.0  CL 93* 99  CO2 26 25  GLUCOSE 123* 97   BUN 30* 35*  CREATININE 7.68* 8.41*  CALCIUM 8.5 7.8*   Liver Function Tests:  Recent Labs Lab 09/08/14 1553 09/09/14 0626  AST 20 19  ALT 9 11  ALKPHOS 106 99  BILITOT 1.0 0.9  PROT 8.3 7.5  ALBUMIN 3.6 3.2*   No results for input(s): LIPASE, AMYLASE in the last 168 hours. No results for input(s): AMMONIA in the last 168 hours. CBC:  Recent Labs Lab 09/08/14 1553 09/09/14 0626  WBC 8.3 6.0  NEUTROABS 7.2  --   HGB 9.9* 9.4*  HCT 28.7* 27.6*  MCV 102.1* 102.6*  PLT 129* 126*   Cardiac Enzymes: No results for input(s): CKTOTAL, CKMB, CKMBINDEX, TROPONINI in the last 168 hours. BNP (last 3 results) No results for input(s): PROBNP in the last 8760 hours. CBG: No results for input(s): GLUCAP in the last 168 hours.  Recent Results (from the past 240 hour(s))  Blood culture (routine x 2)     Status: None (Preliminary result)   Collection Time: 09/08/14  4:30 PM  Result Value Ref Range Status   Specimen Description BLOOD RIGHT HAND  Final   Special Requests   Final    BOTTLES DRAWN AEROBIC AND ANAEROBIC AEB=10CC ANA=8CC   Culture   Final  GRAM POSITIVE COCCI IN CLUSTERS Gram Stain Report Called to,Read Back By and Verified With: MCKINNEY,S  AT 0727 BY HUFFINES,S ON 09/09/14 Performed at [facility]    Report Status PENDING  Incomplete  Blood culture (routine x 2)     Status: None (Preliminary result)   Collection Time: 09/08/14  4:38 PM  Result Value Ref Range Status   Specimen Description BLOOD LEFT HAND  Final   Special Requests   Final    BOTTLES DRAWN AEROBIC AND ANAEROBIC AEB=14CC ANA=10CC   Culture   Final    GRAM POSITIVE COCCI IN CLUSTERS Gram Stain Report Called to,Read Back By and Verified With: MCKINNEY,S AT 0727 BY HUFFINES,S ON 09/09/14  Performed at [facility]    Report Status PENDING  Incomplete     Studies: Dg Chest 2 View  09/08/2014   CLINICAL DATA:  Fever for 2 days with cough  EXAM: CHEST  2 VIEW  COMPARISON:   None.  FINDINGS: Cardiac shadow is within normal limits. A right jugular dialysis catheter is seen in satisfactory position. The lungs are clear bilaterally. No effusion is noted. No acute bony abnormality is seen.  IMPRESSION: No acute abnormality noted.   Electronically Signed   By: Alcide Clever M.D.   On: 09/08/2014 17:13   Ct Head Wo Contrast  09/08/2014   CLINICAL DATA:  Headaches and fevers for 2 days  EXAM: CT HEAD WITHOUT CONTRAST  TECHNIQUE: Contiguous axial images were obtained from the base of the skull through the vertex without intravenous contrast.  COMPARISON:  None.  FINDINGS: Bony calvarium is intact. The paranasal sinuses show partial opacification in the maxillary antra as well as some thickening of the nasal passages on the left. May be related to acute sinusitis. No findings to suggest acute hemorrhage, acute infarction or space-occupying mass lesion are noted.  IMPRESSION: Changes in the maxillary antra and left sinus passages likely related to a degree of sinusitis. No other focal abnormality is noted.   Electronically Signed   By: Alcide Clever M.D.   On: 09/08/2014 18:16    Scheduled Meds: . [START ON 09/11/2014] emtricitabine  200 mg Oral Once per day on Mon Fri  . etravirine  200 mg Oral Q12H  . heparin subcutaneous  5,000 Units Subcutaneous 3 times per day  . piperacillin-tazobactam (ZOSYN)  IV  2.25 g Intravenous Q8H  . raltegravir  400 mg Oral BID  . [START ON 09/14/2014] tenofovir  300 mg Oral Q Mon   Continuous Infusions: . sodium chloride 50 mL/hr at 09/08/14 2206    Active Problems:   Fever   HIV (human immunodeficiency virus infection)   ESRD on hemodialysis  Time spent:  Daily Doe K  [facility] Pager [phone]. If 7PM-7AM, please contact night-coverage at [email or web address], password [facility] 09/09/2014, 12:37 PM  LOS: 1 day

## 2014-09-10 ENCOUNTER — Inpatient Hospital Stay (HOSPITAL_COMMUNITY): Payer: Medicaid Other

## 2014-09-10 DIAGNOSIS — K769 Liver disease, unspecified: Secondary | ICD-10-CM | POA: Diagnosis present

## 2014-09-10 DIAGNOSIS — F17210 Nicotine dependence, cigarettes, uncomplicated: Secondary | ICD-10-CM | POA: Diagnosis present

## 2014-09-10 DIAGNOSIS — J45909 Unspecified asthma, uncomplicated: Secondary | ICD-10-CM | POA: Diagnosis present

## 2014-09-10 DIAGNOSIS — Z8614 Personal history of Methicillin resistant Staphylococcus aureus infection: Secondary | ICD-10-CM | POA: Diagnosis present

## 2014-09-10 DIAGNOSIS — K7689 Other specified diseases of liver: Secondary | ICD-10-CM

## 2014-09-10 DIAGNOSIS — D539 Nutritional anemia, unspecified: Secondary | ICD-10-CM

## 2014-09-10 DIAGNOSIS — R7881 Bacteremia: Secondary | ICD-10-CM

## 2014-09-10 LAB — BASIC METABOLIC PANEL
ANION GAP: 13 (ref 5–15)
BUN: 36 mg/dL — ABNORMAL HIGH (ref 6–23)
CALCIUM: 7.9 mg/dL — AB (ref 8.4–10.5)
CO2: 22 mEq/L (ref 19–32)
Chloride: 101 mEq/L (ref 96–112)
Creatinine, Ser: 8.54 mg/dL — ABNORMAL HIGH (ref 0.50–1.35)
GFR, EST AFRICAN AMERICAN: 9 mL/min — AB (ref >90)
GFR, EST NON AFRICAN AMERICAN: 8 mL/min — AB (ref >90)
GLUCOSE: 102 mg/dL — AB (ref 70–99)
POTASSIUM: 4.2 meq/L (ref 3.7–5.3)
Sodium: 136 mEq/L — ABNORMAL LOW (ref 137–147)

## 2014-09-10 LAB — CBC
HCT: 22.5 % — ABNORMAL LOW (ref 39.0–52.0)
Hemoglobin: 7.6 g/dL — ABNORMAL LOW (ref 13.0–17.0)
MCH: 35 pg — AB (ref 26.0–34.0)
MCHC: 33.8 g/dL (ref 30.0–36.0)
MCV: 103.7 fL — ABNORMAL HIGH (ref 78.0–100.0)
PLATELETS: 146 10*3/uL — AB (ref 150–400)
RBC: 2.17 MIL/uL — ABNORMAL LOW (ref 4.22–5.81)
RDW: 13.6 % (ref 11.5–15.5)
WBC: 7.5 10*3/uL (ref 4.0–10.5)

## 2014-09-10 LAB — HEPATITIS B SURFACE ANTIGEN: HEP B S AG: NEGATIVE

## 2014-09-10 MED ORDER — PENTAFLUOROPROP-TETRAFLUOROETH EX AERO
1.0000 "application " | INHALATION_SPRAY | CUTANEOUS | Status: DC | PRN
  Filled 2014-09-10: qty 30

## 2014-09-10 MED ORDER — SODIUM CHLORIDE 0.9 % IV SOLN: 100.0000 mL | INTRAVENOUS | Status: DC | PRN

## 2014-09-10 MED ORDER — HEPARIN SODIUM (PORCINE) 1000 UNIT/ML IJ SOLN
INTRAMUSCULAR | Status: AC
  Administered 2014-09-10: 1300 [IU] via INTRAVENOUS_CENTRAL
  Filled 2014-09-10: qty 2

## 2014-09-10 MED ORDER — HEPARIN SODIUM (PORCINE) 1000 UNIT/ML DIALYSIS
3800.0000 [IU] | INTRAMUSCULAR | Status: DC | PRN
  Administered 2014-09-10: 3800 [IU] via INTRAVENOUS_CENTRAL
  Filled 2014-09-10 (×2): qty 4

## 2014-09-10 MED ORDER — EPOETIN ALFA 10000 UNIT/ML IJ SOLN
INTRAMUSCULAR | Status: AC
  Administered 2014-09-10: 10000 [IU] via INTRAVENOUS
  Filled 2014-09-10: qty 1

## 2014-09-10 MED ORDER — SODIUM CHLORIDE 0.9 % IJ SOLN
INTRAMUSCULAR | Status: AC
  Administered 2014-09-10: 10 mL via INTRAVENOUS
  Filled 2014-09-10: qty 6

## 2014-09-10 MED ORDER — CEFAZOLIN SODIUM-DEXTROSE 2-3 GM-% IV SOLR
2.0000 g | Freq: Three times a day (TID) | INTRAVENOUS | Status: DC
  Filled 2014-09-10 (×3): qty 50

## 2014-09-10 MED ORDER — CEFAZOLIN SODIUM-DEXTROSE 2-3 GM-% IV SOLR
2.0000 g | INTRAVENOUS | Status: DC
  Administered 2014-09-12: 2 g via INTRAVENOUS
  Filled 2014-09-10 (×2): qty 50

## 2014-09-10 MED ORDER — LIDOCAINE-PRILOCAINE 2.5-2.5 % EX CREA: 1.0000 "application " | TOPICAL_CREAM | CUTANEOUS | Status: DC | PRN

## 2014-09-10 MED ORDER — HEPARIN SODIUM (PORCINE) 1000 UNIT/ML DIALYSIS
20.0000 [IU]/kg | INTRAMUSCULAR | Status: DC | PRN
  Administered 2014-09-10: 1300 [IU] via INTRAVENOUS_CENTRAL
  Filled 2014-09-10 (×2): qty 2

## 2014-09-10 MED ORDER — EPOETIN ALFA 10000 UNIT/ML IJ SOLN
10000.0000 [IU] | INTRAMUSCULAR | Status: DC
  Administered 2014-09-10: 10000 [IU] via INTRAVENOUS
  Filled 2014-09-10 (×2): qty 1
  Filled 2014-09-10: qty 3
  Filled 2014-09-10 (×2): qty 1

## 2014-09-10 MED ORDER — ALTEPLASE 2 MG IJ SOLR
2.0000 mg | Freq: Once | INTRAMUSCULAR | Status: AC | PRN
  Filled 2014-09-10: qty 2

## 2014-09-10 MED ORDER — CEFAZOLIN SODIUM-DEXTROSE 2-3 GM-% IV SOLR
2.0000 g | Freq: Once | INTRAVENOUS | Status: AC
  Administered 2014-09-10: 2 g via INTRAVENOUS
  Filled 2014-09-10: qty 50

## 2014-09-10 MED ORDER — SODIUM CHLORIDE 0.9 % IJ SOLN
10.0000 mL | INTRAMUSCULAR | Status: DC | PRN
  Administered 2014-09-10 (×2): 10 mL via INTRAVENOUS
  Filled 2014-09-10 (×2): qty 10

## 2014-09-10 MED ORDER — HEPARIN SODIUM (PORCINE) 1000 UNIT/ML IJ SOLN
INTRAMUSCULAR | Status: AC
  Administered 2014-09-10: 3800 [IU] via INTRAVENOUS_CENTRAL
  Filled 2014-09-10: qty 4

## 2014-09-10 MED ORDER — NEPRO/CARBSTEADY PO LIQD: 237.0000 mL | ORAL | Status: DC | PRN

## 2014-09-10 MED ORDER — HEPARIN SODIUM (PORCINE) 1000 UNIT/ML DIALYSIS
1000.0000 [IU] | INTRAMUSCULAR | Status: DC | PRN
  Filled 2014-09-10: qty 1

## 2014-09-10 MED ORDER — LIDOCAINE HCL (PF) 1 % IJ SOLN: 5.0000 mL | INTRAMUSCULAR | Status: DC | PRN

## 2014-09-10 NOTE — Consult Note (Signed)
[facility]    Date of Admission:  09/08/2014           Day 3 vancomycin        Day 3 piperacillin tazobactam       Reason for Consult: Automatic consultation for staph aureus bacteremia      Principal Problem:   Staphylococcus aureus bacteremia with sepsis Active Problems:   Liver lesion   History of MRSA infection   HIV (human immunodeficiency virus infection)   ESRD on hemodialysis   Macrocytic anemia   Cigarette smoker   Asthma, chronic   . [START ON 09/11/2014] emtricitabine  200 mg Oral Once per day on Mon Fri  . epoetin alfa  10,000 Units Intravenous Q T,Th,Sa-HD  . etravirine  200 mg Oral Q12H  . heparin subcutaneous  5,000 Units Subcutaneous 3 times per day  . piperacillin-tazobactam (ZOSYN)  IV  2.25 g Intravenous Q8H  . raltegravir  400 mg Oral BID  . sodium chloride      . [START ON 09/14/2014] tenofovir  300 mg Oral Q Mon  . vancomycin  750 mg Intravenous Q T,Th,Sa-HD    Recommendations: 1. Continue vancomycin pending final susceptibility results 2. Narrow piperacillin tazobactam to cefazolin pending final susceptibility results  3. Repeat blood cultures 4. Recommend TEE 5. Recommend removing current hemodialysis catheter soon; do not replace until repeat blood cultures negative 6. Recommend liver ultrasound to evaluate lesion seen on TTE 7. 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 It may very well be the same MRSA that he was infected with in August but I would continue vancomycin along with cefazolin to provide optimal coverage for both MRSA and MSSA pending final susceptibility results. He needs repeat blood cultures today. Her guidelines recommend hemodialysis catheter removal and replacement once blood cultures are negative. He should also undergo TEE to rule out endocarditis. A small lesion was noted in his liver on the TTE. I agree with obtaining a  liver ultrasound to look for any evidence of liver abscess.  He has perinatal HIV infection. He was off of therapy for several years until restarting in mid 2014. He has good viral suppression on his current salvage regimen and he is having steady CD4 reconstitution.    HPI: Dave Estrada is a 23 y.o. 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 He was admitted 3 days ago with fever and both admission blood cultures are growing staph aureus. He was hospitalized at [facility] in August and 3 of 3 blood cultures were positive for MRSA. His infectious disease note from his routine clinic visit at [facility] in early September, and that he had been treated with IV antibiotics and then changed to oral antibiotics. Apparently there was concern for catheter infection but his catheter was not removed.   Review of Systems: Review of systems not obtained due to patient factors.  Past Medical History  Diagnosis Date  . HIV (human immunodeficiency virus infection)   . Renal disorder     History  Substance Use Topics  . Smoking status: Current Every Day Smoker -- 1.00 packs/day for 11 years    Types: Cigarettes  . Smokeless tobacco: Never Used  . Alcohol Use: No    Family History  Problem Relation Age of Onset  . Seizures Father    Allergies  Allergen Reactions  .  Aspirin Other (See Comments)    Reaction is unknown    OBJECTIVE: Blood pressure 143/81, pulse 96, temperature 98.2 F (36.8 C), temperature source Oral, resp. rate 16, height 5\' 5"  (1.651 m), weight 140 lb 6.9 oz (63.7 kg), SpO2 99 %.  Lab Results Lab Results  Component Value Date   WBC 7.5 09/10/2014   HGB 7.6* 09/10/2014   HCT 22.5* 09/10/2014   MCV 103.7* 09/10/2014   PLT 146* 09/10/2014    Lab Results  Component Value Date   CREATININE 8.54* 09/10/2014   BUN 36* 09/10/2014   NA 136* 09/10/2014   K 4.2 09/10/2014   CL 101  09/10/2014   CO2 22 09/10/2014    Lab Results  Component Value Date   ALT 11 09/09/2014   AST 19 09/09/2014   ALKPHOS 99 09/09/2014   BILITOT 0.9 09/09/2014    [facility]: 179 [facility]: Less than 20  Microbiology: Recent Results (from the past 240 hour(s))  Blood culture (routine x 2)     Status: None (Preliminary result)   Collection Time: 09/08/14  4:30 PM  Result Value Ref Range Status   Specimen Description BLOOD RIGHT HAND  Final   Special Requests   Final    BOTTLES DRAWN AEROBIC AND ANAEROBIC AEB=10CC ANA=8CC   Culture  Setup Time   Final    09/09/2014 14:51 Performed at [facility]    Culture   Final    STAPHYLOCOCCUS AUREUS Note: Gram Stain Report Called to,Read Back By and Verified With: Loralie ChampagneMCKINNEY S 0727 BY HUFFINES 09/09/14 Performed at [facility] Performed at [facility]    Report Status PENDING  Incomplete  Blood culture (routine x 2)     Status: None (Preliminary result)   Collection Time: 09/08/14  4:38 PM  Result Value Ref Range Status   Specimen Description BLOOD LEFT HAND  Final   Special Requests   Final    BOTTLES DRAWN AEROBIC AND ANAEROBIC AEB=14CC ANA=10CC   Culture  Setup Time   Final    09/09/2014 14:46 Performed at [facility]    Culture   Final    STAPHYLOCOCCUS AUREUS Note: RIFAMPIN AND GENTAMICIN SHOULD NOT BE USED AS SINGLE DRUGS FOR TREATMENT OF STAPH INFECTIONS. Note: Gram Stain Report Called to,Read Back By and Verified With: [facility] AT 0727 BY HUFFINES S 09/09/14 Performed at [facility] Performed at [facility]    Report Status PENDING  Incomplete  Urine culture     Status: None   Collection Time: 09/08/14  5:25 PM  Result Value Ref Range Status   Specimen Description URINE, CLEAN CATCH  Final   Special Requests NONE  Final   Culture  Setup Time   Final    09/08/2014 23:18 Performed at [facility]    Colony Count   Final     30,000 COLONIES/ML Performed at [facility]    Culture   Final    Multiple bacterial morphotypes present, none predominant. Suggest appropriate recollection if clinically indicated. Performed at [facility]    Report Status 09/09/2014 FINAL  Final    Cliffton AstersJohn Abisai Deer, MD [facility] [phone] pager   [phone] cell 09/10/2014, 12:59 PM

## 2014-09-10 NOTE — Progress Notes (Signed)
Subjective: Interval History: has no complaint of nausea or vomiting. Patient also denies any difficulty in breathing. Presently he is feeling better..  Objective: Vital signs in last 24 hours: Temp:  [98.2 F (36.8 C)-100.1 F (37.8 C)] 99.2 F (37.3 C) (11/26 0701) Pulse Rate:  [90-108] 90 (11/26 0701) Resp:  [20] 20 (11/26 0701) BP: (120-129)/(65-68) 129/66 mmHg (11/26 0701) SpO2:  [100 %] 100 % (11/26 0701) Weight:  [63.458 kg (139 lb 14.4 oz)] 63.458 kg (139 lb 14.4 oz) (11/26 0701) Weight change: -0.045 kg (-1.6 oz)  Intake/Output from previous day: 11/25 0701 - 11/26 0700 In: 1890 [P.O.:480; I.V.:1260; IV Piggyback:150] Out: -  Intake/Output this shift:    General appearance: alert, cooperative and no distress Resp: clear to auscultation bilaterally Cardio: regular rate and rhythm, S1, S2 normal, no murmur, click, rub or gallop GI: soft, non-tender; bowel sounds normal; no masses,  no organomegaly Extremities: extremities normal, atraumatic, no cyanosis or edema  Lab Results:  Recent Labs  09/09/14 0626 09/10/14 0635  WBC 6.0 7.5  HGB 9.4* 7.6*  HCT 27.6* 22.5*  PLT 126* 146*   BMET:  Recent Labs  09/09/14 0626 09/10/14 0635  NA 138 136*  K 4.0 4.2  CL 99 101  CO2 25 22  GLUCOSE 97 102*  BUN 35* 36*  CREATININE 8.41* 8.54*  CALCIUM 7.8* 7.9*   No results for input(s): PTH in the last 72 hours. Iron Studies: No results for input(s): IRON, TIBC, TRANSFERRIN, FERRITIN in the last 72 hours.  Studies/Results: Dg Chest 2 View  09/08/2014   CLINICAL DATA:  Fever for 2 days with cough  EXAM: CHEST  2 VIEW  COMPARISON:  None.  FINDINGS: Cardiac shadow is within normal limits. A right jugular dialysis catheter is seen in satisfactory position. The lungs are clear bilaterally. No effusion is noted. No acute bony abnormality is seen.  IMPRESSION: No acute abnormality noted.   Electronically Signed   By: Alcide CleverMark  Lukens M.D.   On: 09/08/2014 17:13   Ct Head Wo  Contrast  09/08/2014   CLINICAL DATA:  Headaches and fevers for 2 days  EXAM: CT HEAD WITHOUT CONTRAST  TECHNIQUE: Contiguous axial images were obtained from the base of the skull through the vertex without intravenous contrast.  COMPARISON:  None.  FINDINGS: Bony calvarium is intact. The paranasal sinuses show partial opacification in the maxillary antra as well as some thickening of the nasal passages on the left. May be related to acute sinusitis. No findings to suggest acute hemorrhage, acute infarction or space-occupying mass lesion are noted.  IMPRESSION: Changes in the maxillary antra and left sinus passages likely related to a degree of sinusitis. No other focal abnormality is noted.   Electronically Signed   By: Alcide CleverMark  Lukens M.D.   On: 09/08/2014 18:16    I have reviewed the patient's current medications.  Assessment/Plan: Problem #1 sepsis: Patient came with history of fever and chills. Blood culture grows gram-positive cocci. Presently patient is low-grade fever but his white blood cell count is normal. Patient is presently on antibiotics. He has a right tunneled catheter. Problem #2 end-stage renal disease: His status post hemodialysis on Tuesday. Presently his potassium is normal and patient doesn't have any uremic signs and symptoms. Problem #3 history of AIDS Problem #4 hypertension: His blood pressure is reasonably controlled Problem #5 anemia: His hemoglobin is below her target range and patient is on Epogen. Problem #6 metabolic bone disease: His calcium is range. Plan: We'll make  arrangements for patient to get dialysis today We'll increase his Epogen to 10,000 units IV after each dialysis I have discussed with the patient possibly his catheter may need to be taken out and a new catheter placed. Patient presently states that he has difficulty in getting catheter and at this moment he does want to be removed. We'll check his CBC, basic metabolic panel and phosphorus in the  morning.   LOS: 2 days   Sheamus Hasting S 09/10/2014,9:04 AM

## 2014-09-10 NOTE — Progress Notes (Signed)
[facility] PROGRESS NOTE  Dave Estrada MCR:[id number] DOB: [birth year] DOA: 09/08/2014 PCP: No primary care provider on file.  Assessment/Plan: 1. Staph aureus bacteremia with sepsis  1. Thus far gm pos in clusters 2/2 in blood with gm pos in urine 2. Question HD cath as source, as this has been in place for over a year 3. No longer septic and pt reports feeling better 4. 2d echo neg for vegetations 5. ID auto-consulted - appreciate recs 6. ID recs for changing zosyn to cefazolin, repeat blood cx, TEE 7. Will consult Cardiology for TEE 8. Consider HD cath removal soon, and not replacing until repeat blood cx neg 2. ESRD on TTS HD 1. Nephrology consulted 3. HIV 1. Followed at [facility] 2. Cont on HIV anti-retrovirals 4. DVT prophylaxis 1. lovenox 5. Liver lesion on 2d echo 1. Will follow up with [facility] [place] to investigate 2. LFT's are unremarkable  Code Status: Full Family Communication: Pt in room Disposition Plan: Pending   Consultants:  Nephrology  ID  Procedures:    Antibiotics:  Vancomycin 11/24>>>  Zosyn 11/24>>>11/26  Ancef 11/26>>>  HPI/Subjective: Pt is very eager to go home.  Objective: Filed Vitals:   09/10/14 1230 09/10/14 1300 09/10/14 1330 09/10/14 1400  BP: 143/81 146/92 145/82 134/80  Pulse: 96 83 82 92  Temp:      TempSrc:      Resp:      Height:      Weight:      SpO2:        Intake/Output Summary (Last 24 hours) at 09/10/14 1438 Last data filed at 09/10/14 0900  Gross per 24 hour  Intake   1200 ml  Output      0 ml  Net   1200 ml   Filed Weights   09/09/14 0614 09/10/14 0701 09/10/14 1140  Weight: 63.8 kg (140 lb 10.5 oz) 63.458 kg (139 lb 14.4 oz) 63.7 kg (140 lb 6.9 oz)    Exam:   General:  Awake, in nad  Cardiovascular: regular, s1, s2  Respiratory: normal resp effort, no wheezing  Abdomen: soft, nondistended  Musculoskeletal: perfused, no clubbing   Data Reviewed: Basic Metabolic Panel:  Recent  Labs Lab 09/08/14 1553 09/09/14 0626 09/10/14 0635  NA 135* 138 136*  K 3.7 4.0 4.2  CL 93* 99 101  CO2 26 25 22   GLUCOSE 123* 97 102*  BUN 30* 35* 36*  CREATININE 7.68* 8.41* 8.54*  CALCIUM 8.5 7.8* 7.9*   Liver Function Tests:  Recent Labs Lab 09/08/14 1553 09/09/14 0626  AST 20 19  ALT 9 11  ALKPHOS 106 99  BILITOT 1.0 0.9  PROT 8.3 7.5  ALBUMIN 3.6 3.2*   No results for input(s): LIPASE, AMYLASE in the last 168 hours. No results for input(s): AMMONIA in the last 168 hours. CBC:  Recent Labs Lab 09/08/14 1553 09/09/14 0626 09/10/14 0635  WBC 8.3 6.0 7.5  NEUTROABS 7.2  --   --   HGB 9.9* 9.4* 7.6*  HCT 28.7* 27.6* 22.5*  MCV 102.1* 102.6* 103.7*  PLT 129* 126* 146*   Cardiac Enzymes: No results for input(s): CKTOTAL, CKMB, CKMBINDEX, TROPONINI in the last 168 hours. BNP (last 3 results) No results for input(s): PROBNP in the last 8760 hours. CBG: No results for input(s): GLUCAP in the last 168 hours.  Recent Results (from the past 240 hour(s))  Blood culture (routine x 2)     Status: None (Preliminary result)   Collection Time: 09/08/14  4:30 PM  Result Value Ref Range Status   Specimen Description BLOOD RIGHT HAND  Final   Special Requests   Final    BOTTLES DRAWN AEROBIC AND ANAEROBIC AEB=10CC ANA=8CC   Culture  Setup Time   Final    09/09/2014 14:51 Performed at [facility]    Culture   Final    STAPHYLOCOCCUS AUREUS Note: Gram Stain Report Called to,Read Back By and Verified With: [name] [phone] BY HUFFINES 09/09/14 Performed at [facility] Performed at [facility]    Report Status PENDING  Incomplete  Blood culture (routine x 2)     Status: None (Preliminary result)   Collection Time: 09/08/14  4:38 PM  Result Value Ref Range Status   Specimen Description BLOOD LEFT HAND  Final   Special Requests   Final    BOTTLES DRAWN AEROBIC AND ANAEROBIC AEB=14CC ANA=10CC   Culture  Setup Time   Final    09/09/2014  14:46 Performed at [facility]    Culture   Final    STAPHYLOCOCCUS AUREUS Note: RIFAMPIN AND GENTAMICIN SHOULD NOT BE USED AS SINGLE DRUGS FOR TREATMENT OF STAPH INFECTIONS. Note: Gram Stain Report Called to,Read Back By and Verified With: [facility] AT 0727 BY HUFFINES S 09/09/14 Performed at [facility] Performed at [facility]    Report Status PENDING  Incomplete  Urine culture     Status: None   Collection Time: 09/08/14  5:25 PM  Result Value Ref Range Status   Specimen Description URINE, CLEAN CATCH  Final   Special Requests NONE  Final   Culture  Setup Time   Final    09/08/2014 23:18 Performed at [facility]   Final    30,000 COLONIES/ML Performed at [facility]    Culture   Final    Multiple bacterial morphotypes present, none predominant. Suggest appropriate recollection if clinically indicated. Performed at [facility]    Report Status 09/09/2014 FINAL  Final     Studies: Dg Chest 2 View  09/08/2014   CLINICAL DATA:  Fever for 2 days with cough  EXAM: CHEST  2 VIEW  COMPARISON:  None.  FINDINGS: Cardiac shadow is within normal limits. A right jugular dialysis catheter is seen in satisfactory position. The lungs are clear bilaterally. No effusion is noted. No acute bony abnormality is seen.  IMPRESSION: No acute abnormality noted.   Electronically Signed   By: Alcide Clever M.D.   On: 09/08/2014 17:13   Ct Head Wo Contrast  09/08/2014   CLINICAL DATA:  Headaches and fevers for 2 days  EXAM: CT HEAD WITHOUT CONTRAST  TECHNIQUE: Contiguous axial images were obtained from the base of the skull through the vertex without intravenous contrast.  COMPARISON:  None.  FINDINGS: Bony calvarium is intact. The paranasal sinuses show partial opacification in the maxillary antra as well as some thickening of the nasal passages on the left. May be related to acute sinusitis. No findings to suggest acute hemorrhage,  acute infarction or space-occupying mass lesion are noted.  IMPRESSION: Changes in the maxillary antra and left sinus passages likely related to a degree of sinusitis. No other focal abnormality is noted.   Electronically Signed   By: Alcide Clever M.D.   On: 09/08/2014 18:16    Scheduled Meds: .  ceFAZolin (ANCEF) IV  2 g Intravenous Once  . [START ON 09/12/2014]  ceFAZolin (ANCEF) IV  2 g Intravenous Q T,Th,Sa-HD  . [START ON 09/11/2014] emtricitabine  200 mg Oral Once per day on Mon Fri  . epoetin alfa  10,000 Units Intravenous Q T,Th,Sa-HD  . etravirine  200 mg Oral Q12H  . heparin subcutaneous  5,000 Units Subcutaneous 3 times per day  . raltegravir  400 mg Oral BID  . [START ON 09/14/2014] tenofovir  300 mg Oral Q Mon  . vancomycin  750 mg Intravenous Q T,Th,Sa-HD   Continuous Infusions: . sodium chloride 50 mL/hr at 09/09/14 2104    Principal Problem:   Staphylococcus aureus bacteremia with sepsis Active Problems:   HIV (human immunodeficiency virus infection)   ESRD on hemodialysis   Macrocytic anemia   Cigarette smoker   Liver lesion   Asthma, chronic   History of MRSA infection  Time spent: 30min  CHIU, STEPHEN K  [facility] Pager [phone]. If 7PM-7AM, please contact night-coverage at [email or web address], password [email or web address] 09/10/2014, 2:38 PM  LOS: 2 days

## 2014-09-10 NOTE — Progress Notes (Signed)
Received call from lab that patient's blood cultures were growing MRSA.  Dr. Rhona Leavens notified.  Patient place on contact precautions.

## 2014-09-10 NOTE — Progress Notes (Signed)
ANTIBIOTIC CONSULT NOTE - INITIAL  Pharmacy Consult for   Cefazolin Indication: staph aureus bacteremia  Allergies  Allergen Reactions  . Aspirin Other (See Comments)    Reaction is unknown    Patient Measurements: Height: 5\' 5"  (165.1 cm) Weight: 140 lb 6.9 oz (63.7 kg) IBW/kg (Calculated) : 61.5 Adjusted Body Weight:   Vital Signs: Temp: 98.2 F (36.8 C) (11/26 1140) Temp Source: Oral (11/26 1140) BP: 146/92 mmHg (11/26 1300) Pulse Rate: 83 (11/26 1300) Intake/Output from previous day: 11/25 0701 - 11/26 0700 In: 1890 [P.O.:480; I.V.:1260; IV Piggyback:150] Out: -  Intake/Output from this shift:    Labs:  Recent Labs  09/08/14 1553 09/09/14 0626 09/10/14 0635  WBC 8.3 6.0 7.5  HGB 9.9* 9.4* 7.6*  PLT 129* 126* 146*  CREATININE 7.68* 8.41* 8.54*   Estimated Creatinine Clearance: 11.5 mL/min (by C-G formula based on Cr of 8.54). No results for input(s): VANCOTROUGH, VANCOPEAK, VANCORANDOM, GENTTROUGH, GENTPEAK, GENTRANDOM, TOBRATROUGH, TOBRAPEAK, TOBRARND, AMIKACINPEAK, AMIKACINTROU, AMIKACIN in the last 72 hours.   Microbiology: Recent Results (from the past 720 hour(s))  Blood culture (routine x 2)     Status: None (Preliminary result)   Collection Time: 09/08/14  4:30 PM  Result Value Ref Range Status   Specimen Description BLOOD RIGHT HAND  Final   Special Requests   Final    BOTTLES DRAWN AEROBIC AND ANAEROBIC AEB=10CC ANA=8CC   Culture  Setup Time   Final    09/09/2014 14:51 [facility]formed at [facility]    Culture   Final    STAPHYLOCOCCUS AUREUS Note: Gram Stain Report Called to,Read Back By and Verified With: Loralie ChampagneMCKINNEY S [phone] BY HUFFINES 09/09/14 Performed at [facility] Performed at [facility]    Report Status PENDING  Incomplete  Blood culture (routine x 2)     Status: None (Preliminary result)   Collection Time: 09/08/14  4:38 PM  Result Value Ref Range Status   Specimen Description BLOOD LEFT HAND  Final   Special  Requests   Final    BOTTLES DRAWN AEROBIC AND ANAEROBIC AEB=14CC ANA=10CC   Culture  Setup Time   Final    09/09/2014 14:46 [facility]formed at [facility]    Culture   Final    STAPHYLOCOCCUS AUREUS Note: RIFAMPIN AND GENTAMICIN SHOULD NOT BE USED AS SINGLE DRUGS FOR TREATMENT OF STAPH INFECTIONS. Note: Gram Stain Report Called to,Read Back By and Verified With: [facility] AT 0727 BY HUFFINES S 09/09/14 Performed at [facility]    Report Status PENDING  Incomplete  Urine culture     Status: None   Collection Time: 09/08/14  5:25 PM  Result Value Ref Range Status   Specimen Description URINE, CLEAN CATCH  Final   Special Requests NONE  Final   Culture  Setup Time   Final    09/08/2014 23:18 [facility]formed at [facility]    Colony Count   Final    30,000 COLONIES/ML [facility]formed at [facility]    Culture   Final    Multiple bacterial morphotypes present, none predominant. Suggest appropriate recollection if clinically indicated. [facility]formed at [facility]    Report Status 09/09/2014 FINAL  Final    Medical History: Past Medical History  Diagnosis Date  . HIV (human immunodeficiency virus infection)   . Renal disorder     Medications:  Scheduled:  .  ceFAZolin (ANCEF) IV  2 g Intravenous Q8H  . [START  ON 09/11/2014] emtricitabine  200 mg Oral Once per day on Mon Fri  . epoetin alfa  10,000 Units Intravenous Q T,Th,Sa-HD  . etravirine  200 mg Oral Q12H  . heparin subcutaneous  5,000 Units Subcutaneous 3 times per day  . raltegravir  400 mg Oral BID  . [START ON 09/14/2014] tenofovir  300 mg Oral Q Mon  . vancomycin  750 mg Intravenous Q T,Th,Sa-HD   Assessment: ID consult recommendation, Dr. Cliffton Asters Patient has recurrent staph aureus bacteremia. It may very well be the same MRSA that he was infected with in August but continue vancomycin along with cefazolin to provide optimal coverage for both MRSA  and MSSA pending final susceptibility results. Repeat blood cultures today.Guidelines recommend hemodialysis catheter removal and replacement once blood cultures are negative.  ESRD HD patient  Goal of Therapy:  Eradicate infection  Plan:  Cefazolin 2 GM IV now and after each HD (normal schedule T,TH, Sat Continue Vancomycin 750 mg IV after each HD Labs per protocol   Raquel James, Syreeta Figler Bennett 09/10/2014,1:31 PM

## 2014-09-10 NOTE — Procedures (Signed)
   HEMODIALYSIS TREATMENT NOTE  3.5 hour low-heparin dialysis completed via right IJ tunneled catheter.  Exit site unremarkable.  Goal met:  Tolerated removal of 1.8 liters without interruption in ultrafiltration.  Poor Qb through catheter despite fact that both limbs aspirated and flushed without resistance.  Average Qb 250 (excessively negative arterial pressures). All blood was reinfused.  Epogen 10Ku and Vancomycin 753m given with HD.  Report given to MMonticello RTherapist, sports  Audreanna Torrisi L. Dravon Nott, RN, CDn

## 2014-09-11 ENCOUNTER — Encounter (HOSPITAL_COMMUNITY): Payer: Self-pay | Admitting: Cardiology

## 2014-09-11 DIAGNOSIS — R7881 Bacteremia: Secondary | ICD-10-CM

## 2014-09-11 DIAGNOSIS — B9562 Methicillin resistant Staphylococcus aureus infection as the cause of diseases classified elsewhere: Secondary | ICD-10-CM

## 2014-09-11 LAB — CULTURE, BLOOD (ROUTINE X 2)

## 2014-09-11 LAB — CBC
HCT: 24.4 % — ABNORMAL LOW (ref 39.0–52.0)
Hemoglobin: 8.1 g/dL — ABNORMAL LOW (ref 13.0–17.0)
MCH: 34 pg (ref 26.0–34.0)
MCHC: 33.2 g/dL (ref 30.0–36.0)
MCV: 102.5 fL — AB (ref 78.0–100.0)
PLATELETS: 192 10*3/uL (ref 150–400)
RBC: 2.38 MIL/uL — AB (ref 4.22–5.81)
RDW: 15 % (ref 11.5–15.5)
WBC: 7.1 10*3/uL (ref 4.0–10.5)

## 2014-09-11 LAB — BASIC METABOLIC PANEL
Anion gap: 12 (ref 5–15)
BUN: 19 mg/dL (ref 6–23)
CO2: 25 meq/L (ref 19–32)
Calcium: 8.1 mg/dL — ABNORMAL LOW (ref 8.4–10.5)
Chloride: 101 mEq/L (ref 96–112)
Creatinine, Ser: 6.06 mg/dL — ABNORMAL HIGH (ref 0.50–1.35)
GFR calc Af Amer: 14 mL/min — ABNORMAL LOW (ref >90)
GFR calc non Af Amer: 12 mL/min — ABNORMAL LOW (ref >90)
GLUCOSE: 105 mg/dL — AB (ref 70–99)
Potassium: 4 mEq/L (ref 3.7–5.3)
SODIUM: 138 meq/L (ref 137–147)

## 2014-09-11 LAB — PHOSPHORUS: Phosphorus: 2.5 mg/dL (ref 2.3–4.6)

## 2014-09-11 NOTE — Progress Notes (Signed)
Patient ID: Barbaraann RondoDuane K Gibbs, male   DOB: 08-30-1991, 23 y.o.   MRN: [id number]         [facility]    Date of Admission:  09/08/2014   Total days of antibiotics 4          Principal Problem:   MRSA bacteremia Active Problems:   Staphylococcus aureus bacteremia with sepsis   Liver lesion   History of MRSA infection   HIV (human immunodeficiency virus infection)   ESRD on hemodialysis   Macrocytic anemia   Cigarette smoker   Asthma, chronic   . [START ON 09/12/2014]  ceFAZolin (ANCEF) IV  2 g Intravenous Q T,Th,Sa-HD  . emtricitabine  200 mg Oral Once per day on Mon Fri  . epoetin alfa  10,000 Units Intravenous Q T,Th,Sa-HD  . etravirine  200 mg Oral Q12H  . heparin subcutaneous  5,000 Units Subcutaneous 3 times per day  . raltegravir  400 mg Oral BID  . [START ON 09/14/2014] tenofovir  300 mg Oral Q Mon  . vancomycin  750 mg Intravenous Q T,Th,Sa-HD     OBJECTIVE: Blood pressure 114/70, pulse 106, temperature 99.2 F (37.3 C), temperature source Oral, resp. rate 20, height 5\' 5"  (1.651 m), weight 136 lb 14.4 oz (62.097 kg), SpO2 100 %.  Lab Results Lab Results  Component Value Date   WBC 7.1 09/11/2014   HGB 8.1* 09/11/2014   HCT 24.4* 09/11/2014   MCV 102.5* 09/11/2014   PLT 192 09/11/2014    Lab Results  Component Value Date   CREATININE 6.06* 09/11/2014   BUN 19 09/11/2014   NA 138 09/11/2014   K 4.0 09/11/2014   CL 101 09/11/2014   CO2 25 09/11/2014    Lab Results  Component Value Date   ALT 11 09/09/2014   AST 19 09/09/2014   ALKPHOS 99 09/09/2014   BILITOT 0.9 09/09/2014     Microbiology: Recent Results (from the past 240 hour(s))  Blood culture (routine x 2)     Status: None   Collection Time: 09/08/14  4:30 PM  Result Value Ref Range Status   Specimen Description BLOOD RIGHT HAND  Final   Special Requests   Final    BOTTLES DRAWN AEROBIC AND ANAEROBIC AEB=10CC ANA=8CC   Culture  Setup Time   Final   09/09/2014 14:51 Performed at [facility]    Culture   Final    STAPHYLOCOCCUS AUREUS Note: SUSCEPTIBILITIES PERFORMED ON PREVIOUS CULTURE WITHIN THE LAST 5 DAYS. Note: Gram Stain Report Called to,Read Back By and Verified With: Loralie ChampagneMCKINNEY S [phone] BY HUFFINES 09/09/14 Performed at [facility]    Report Status 09/11/2014 FINAL  Final  Blood culture (routine x 2)     Status: None   Collection Time: 09/08/14  4:38 PM  Result Value Ref Range Status   Specimen Description BLOOD LEFT HAND  Final   Special Requests   Final    BOTTLES DRAWN AEROBIC AND ANAEROBIC AEB=14CC ANA=10CC   Culture  Setup Time   Final    09/09/2014 14:46 Performed at [facility]    Culture   Final    METHICILLIN RESISTANT STAPHYLOCOCCUS AUREUS Note: RIFAMPIN AND GENTAMICIN SHOULD NOT BE USED AS SINGLE DRUGS FOR TREATMENT OF STAPH INFECTIONS. CRITICAL RESULT CALLED TO, READ BACK BY AND VERIFIED WITH: [name] 09/10/14 1510 BY [name] This organism DOES NOT demonstrate inducible  Clindamycin resistance in vitro. Note: Gram Stain  Report Called to,Read Back By and Verified With: [facility] AT 0727 BY HUFFINES S 09/09/14 Performed at [facility] Performed at [facility]    Report Status 09/11/2014 FINAL  Final   Organism ID, Bacteria METHICILLIN RESISTANT STAPHYLOCOCCUS AUREUS  Final      Susceptibility   Methicillin resistant staphylococcus aureus - MIC*    CLINDAMYCIN <=0.25 SENSITIVE Sensitive     ERYTHROMYCIN >=8 RESISTANT Resistant     GENTAMICIN <=0.5 SENSITIVE Sensitive     LEVOFLOXACIN 4 INTERMEDIATE Intermediate     OXACILLIN >=4 RESISTANT Resistant     PENICILLIN >=0.5 RESISTANT Resistant     RIFAMPIN <=0.5 SENSITIVE Sensitive     TRIMETH/SULFA <=10 SENSITIVE Sensitive     VANCOMYCIN <=0.5 SENSITIVE Sensitive     TETRACYCLINE <=1 SENSITIVE Sensitive     * METHICILLIN RESISTANT STAPHYLOCOCCUS AUREUS  Urine culture      Status: None   Collection Time: 09/08/14  5:25 PM  Result Value Ref Range Status   Specimen Description URINE, CLEAN CATCH  Final   Special Requests NONE  Final   Culture  Setup Time   Final    09/08/2014 23:18 Performed at [facility]   Final    30,000 COLONIES/ML Performed at [facility]    Culture   Final    Multiple bacterial morphotypes present, none predominant. Suggest appropriate recollection if clinically indicated. Performed at [facility]    Report Status 09/09/2014 FINAL  Final  Culture, blood (routine x 2)     Status: None (Preliminary result)   Collection Time: 09/10/14  2:30 PM  Result Value Ref Range Status   Specimen Description PORTA CATH  Final   Special Requests BOTTLES DRAWN AEROBIC AND ANAEROBIC 10CC EACH  Final   Culture PENDING  Incomplete   Report Status PENDING  Incomplete    Assessment: He has recurrent MRSA bacteremia. I will stop cefazolin and continue vancomycin alone. So far he has not agree hemodialysis catheter removed. This will make it much more difficult to cure his infection and puts him at very high risk for other recurrences and complications. So far repeat blood cultures are negative at 24 hours. TEE is scheduled for Monday, November 30. Optimal duration of therapy with vancomycin will depend on TEE results and whether or not he will allow removal of his catheter.  Plan: 1. Continue vancomycin 2. Discontinue cefazolin 3. I will follow-up on November 30  Cliffton Asters, MD [facility] [phone] pager   [phone] cell 09/11/2014, 12:42 PM

## 2014-09-11 NOTE — Progress Notes (Signed)
Subjective: Interval History: has no complaint of nausea or vomiting. Patient denies any difficulty breathing.  Objective: Vital signs in last 24 hours: Temp:  [98.2 F (36.8 C)-99.2 F (37.3 C)] 99.2 F (37.3 C) (11/27 0640) Pulse Rate:  [82-110] 106 (11/27 0640) Resp:  [15-20] 20 (11/27 0640) BP: (114-147)/(70-92) 114/70 mmHg (11/27 0640) SpO2:  [99 %-100 %] 100 % (11/27 0640) Weight:  [62.097 kg (136 lb 14.4 oz)-66.9 kg (147 lb 7.8 oz)] 62.097 kg (136 lb 14.4 oz) (11/27 0640) Weight change:   Intake/Output from previous day: 11/26 0701 - 11/27 0700 In: 600 [P.O.:600] Out: 1800  Intake/Output this shift:    General appearance: alert, cooperative and no distress Resp: clear to auscultation bilaterally Cardio: regular rate and rhythm, S1, S2 normal, no murmur, click, rub or gallop GI: soft, non-tender; bowel sounds normal; no masses,  no organomegaly Extremities: extremities normal, atraumatic, no cyanosis or edema  Lab Results:  Recent Labs  09/10/14 0635 09/11/14 0649  WBC 7.5 7.1  HGB 7.6* 8.1*  HCT 22.5* 24.4*  PLT 146* 192   BMET:   Recent Labs  09/10/14 0635 09/11/14 0649  NA 136* 138  K 4.2 4.0  CL 101 101  CO2 22 25  GLUCOSE 102* 105*  BUN 36* 19  CREATININE 8.54* 6.06*  CALCIUM 7.9* 8.1*   No results for input(s): PTH in the last 72 hours. Iron Studies: No results for input(s): IRON, TIBC, TRANSFERRIN, FERRITIN in the last 72 hours.  Studies/Results: US Abdomen Limited  09/10/2014   CLINICAL DATA:  Indeterminate liver lesion identified on echocardiogram.  EXAM: US ABDOMEN LIMITED - RIGHT UPPER QUADRANT  COMPARISON:  None.  FINDINGS: Common bile duct:  Diameter: Normal diameter.  Liver:  No focal liver lesion identified.  No ductal dilatation.  IMPRESSION: No focal liver lesion identified.   Electronically Signed   By: Genevive Bi M.D.   On: 09/10/2014 16:33    I have reviewed the patient's current medications.  Assessment/Plan: Problem  #1 sepsis: Patient came with history of fever and chills. His blood culture is positive for MRSA. Problem #2 end-stage renal disease: His status post hemodialysis yesterday. Presently he doesn't have any uremic sinus symptoms. Problem #3 history of AIDS Problem #4 hypertension: His blood pressure is reasonably controlled Problem #5 anemia: His hemoglobin is below her target range and patient is on Epogen. Problem #6 metabolic bone disease: His calcium and his phosphorus is 0 range. Plan: At this moment his catheter need to be removed. I have discussed with the patient but at this moment doesn't seem to be agreeing to take out his catheter. I've explained to him the danger of having a catheter especially in presence of MRSA. If he agrees we'll call Dr. Lovell Sheehan to take out his catheter and then wait for another 24-48 hours and place another catheter. If patient doesn't agree possibly he may need to be transferred to his nephrologist/urologist at [facility]. We'll call the pharmacy consult to dose his antibiotics or involve ID to comment specific antibiotics. We'll check his CBC, basic metabolic panel in the morning.   LOS: 3 days   Darren Nodal S 09/11/2014,8:56 AM

## 2014-09-11 NOTE — Consult Note (Signed)
Consulting cardiologist: Dr. Jonelle Sidle  Reason for consultation: Request TEE  Clinical Summary Dave Estrada is a 23 y.o.male with history of HIV on anti-retrovirals, ESRD on hemodialysis via tunneled catheter, now admitted with sepsis and blood cultures growing MRSA. His catheter is suspected as a source, transthoracic echocardiogram did not report any valvular vegetations although could not exclude catheter associated vegetation. He was also noted to incidentally have an echogenic, atypical structure noted within the liver. Focused hepatic ultrasound however did not demonstrate any obvious lesion. We are consulted to assist with arranging a TEE as recommended by Infectious Disease chart review.  Patient denies any dysphagia or esophageal disorders. He ate a full breakfast this morning.    Allergies  Allergen Reactions  . Aspirin Other (See Comments)    Reaction is unknown    Medications Scheduled Medications: . [START ON 09/12/2014]  ceFAZolin (ANCEF) IV  2 g Intravenous Q T,Th,Sa-HD  . emtricitabine  200 mg Oral Once per day on Mon Fri  . epoetin alfa  10,000 Units Intravenous Q T,Th,Sa-HD  . etravirine  200 mg Oral Q12H  . heparin subcutaneous  5,000 Units Subcutaneous 3 times per day  . raltegravir  400 mg Oral BID  . [START ON 09/14/2014] tenofovir  300 mg Oral Q Mon  . vancomycin  750 mg Intravenous Q T,Th,Sa-HD    Infusions: . sodium chloride 50 mL/hr at 09/09/14 2104    PRN Medications: sodium chloride, sodium chloride, acetaminophen **OR** acetaminophen, feeding supplement (NEPRO CARB STEADY), heparin, heparin, lidocaine (PF), lidocaine-prilocaine, ondansetron **OR** ondansetron (ZOFRAN) IV, pentafluoroprop-tetrafluoroeth, sodium chloride, sodium chloride   Past Medical History  Diagnosis Date  . HIV (human immunodeficiency virus infection)   . ESRD (end stage renal disease)     Past Surgical History  Procedure Laterality Date  . Port a cath  revision      Family History  Problem Relation Age of Onset  . Seizures Father     Social History Dave Estrada reports that he has been smoking Cigarettes.  He has a 11 pack-year smoking history. He has never used smokeless tobacco. Dave Estrada reports that he does not drink alcohol.  Review of Systems Complete review of systems negative except as otherwise outlined in the clinical summary and also the following. Patient with fevers preceding presentation, also myalgias and headache. Symptoms have been improving.  Physical Examination Blood pressure 114/70, pulse 106, temperature 99.2 F (37.3 C), temperature source Oral, resp. rate 20, height 5\' 5"  (1.651 m), weight 136 lb 14.4 oz (62.097 kg), SpO2 100 %.  Intake/Output Summary (Last 24 hours) at 09/11/14 0934 Last data filed at 09/11/14 0200  Gross per 24 hour  Intake    360 ml  Output   1800 ml  Net  -1440 ml   Patient appears comfortable at rest. HEENT: Conjunctiva and lids normal, oropharynx clear. Neck: Supple, no elevated JVP or carotid bruits, no thyromegaly. Lungs: Clear to auscultation, nonlabored breathing at rest. Thorax: Port-A-Cath on right. Cardiac: Regular rate and rhythm, no S3 or significant systolic murmur, no pericardial rub. Abdomen: Soft, nontender, bowel sounds present, no guarding or rebound. Extremities: No pitting edema, distal pulses 2+. Skin: Warm and dry. Musculoskeletal: No kyphosis. Neuropsychiatric: Alert and oriented x3, affect grossly appropriate.   Lab Results  Basic Metabolic Panel:  Recent Labs Lab 09/08/14 1553 09/09/14 0626 09/10/14 0635 09/11/14 0649  NA 135* 138 136* 138  K 3.7 4.0 4.2 4.0  CL 93* 99 101 101  CO2 26 25 22 25   GLUCOSE 123* 97 102* 105*  BUN 30* 35* 36* 19  CREATININE 7.68* 8.41* 8.54* 6.06*  CALCIUM 8.5 7.8* 7.9* 8.1*  PHOS  --   --   --  2.5    Liver Function Tests:  Recent Labs Lab 09/08/14 1553 09/09/14 0626  AST 20 19  ALT 9 11  ALKPHOS  106 99  BILITOT 1.0 0.9  PROT 8.3 7.5  ALBUMIN 3.6 3.2*    CBC:  Recent Labs Lab 09/08/14 1553 09/09/14 0626 09/10/14 0635 09/11/14 0649  WBC 8.3 6.0 7.5 7.1  NEUTROABS 7.2  --   --   --   HGB 9.9* 9.4* 7.6* 8.1*  HCT 28.7* 27.6* 22.5* 24.4*  MCV 102.1* 102.6* 103.7* 102.5*  PLT 129* 126* 146* 192    Echocardiogram (09/10/2014); Study Conclusions  - Left ventricle: The cavity size was normal. Wall thickness was normal. Systolic function was mildly reduced. The estimated ejection fraction was in the range of 45% to 50%. Left ventricular diastolic function parameters were normal. - Aortic valve: Mildly calcified annulus. Trileaflet; mildly thickened leaflets. - Mitral valve: Mildly calcified annulus. Mildly thickened leaflets . - Left atrium: The atrium was mildly dilated. - Right atrium: The catheter tip is not visualized well enough to exclude possible vegetation given known bacteremia. - Pericardium, extracardiac: There is a 1.8 x 1.0 cm echogenic atypical structure noted in the liver. Consider focused liver US study. - Impressions: No evidence of valvular vegetation. Cannot exlcude possible RA catheter vegetation. - Technically adequate study.  Impressions:  - No evidence of valvular vegetation. Cannot exlcude possible RA catheter vegetation.   Imaging CLINICAL DATA: Indeterminate liver lesion identified on echocardiogram.  EXAM: US ABDOMEN LIMITED - RIGHT UPPER QUADRANT  COMPARISON: None.  FINDINGS: Common bile duct:  Diameter: Normal diameter.  Liver:  No focal liver lesion identified. No ductal dilatation.  IMPRESSION: No focal liver lesion identified.  Impression  1. Patient with MRSA bacteremia, Port-A-Cath suspected as potential source, although TEE requested to clearly exclude any valvular vegetations per recommendation of Infectious Disease. Patient ate breakfast this morning, we also do not have full staff over  the holidays and cannot complete TEE today.  2. End-stage renal disease, on hemodialysis. Patient currently being followed by Dr. Kristian CoveyBefekadu in consultation.  3. HIV, on antiretrovirals, [facility].   Recommendations  I discussed with the hospitalist team and also Dr. Kristian CoveyBefekadu. It sounds like ultimate plan is for the patient to be transferred [facility] for replacement of dialysis catheter, awaiting confirmation of negative blood cultures. It might make sense to transfer him to the hospitalist team at [facility], have his TEE done at that facility, and then have the dialysis catheter replaced presuming blood cultures and TEE are negative. Otherwise, we can proceed with TEE here next week. He is afebrile and continues on broad-spectrum antibiotics.  Jonelle SidleSamuel G. Romani Wilbon, M.D., F.A.C.C.

## 2014-09-11 NOTE — H&P (Signed)
Report given to Micheal with [facility] at 1426 and report given to Paulino Rily of [facility] at 1445 both verbalized understanding and voiced no further questions at the time of report.  Patient has been updated on transport, has sign consent to transfer and his packet is ready.

## 2014-09-11 NOTE — Progress Notes (Addendum)
TEE scheduled for 2 pm on Monday, 11-30, with Dr. Shirlee Latch. Orders written.  Pt will need to be consented prior to procedure.  [facility] has been requested to perform a transesophageal echocardiogram on 11/27 for SBE.  After careful review of history and examination, the risks and benefits of transesophageal echocardiogram have been explained including risks of esophageal damage, perforation (1:10,000 risk), bleeding, pharyngeal hematoma as well as other potential complications associated with conscious sedation including aspiration, arrhythmia, respiratory failure and death. Alternatives to treatment were discussed, questions were answered. Patient is willing to proceed.   Theodore Demark, PA-C 09/11/2014 11:39 AM Beeper [phone]

## 2014-09-11 NOTE — Progress Notes (Signed)
[facility] PROGRESS NOTE  Dave Estrada UJW:[id number] DOB: [birth year] DOA: 09/08/2014 PCP: No primary care provider on file.   Off Service Summary: 23yo with hx of HIV and ESRD on TTS who initially presented with sepsis with bacteremia. Cultures ultimately demonstrated MRSA species. ID and Nephrology have been following. Pt's sepsis has since resolved on vancomycin and ancef per ID recs. Cardiology has been consulted with eventual plans for TEE. Repeat blood cultures have been obtained on 11/26 and are still pending. ID recs for removing current tunneled cath soon with placement of new HD cath when blood cultures return neg. Given the complexity of the situation, Nephrology and Cardiology have both recommended transfer [facility] to [facility] work up. Case has been discussed with accepting hospitalist as well as Nephrology and Cardiology, who will both follow upon transfer [facility].  Assessment/Plan: 1. MRSA bacteremia with sepsis  1. No longer septic and pt reports feeling better 2. 2d echo neg for vegetations 3. Cardiology consulted for TEE 4. ID following with recs for cefazolin and vanc, repeat blood cx pending 5. Plan for R IJ tunneled cath removal soon, and not replacing until repeat blood cx neg 2. ESRD on TTS HD 1. Nephrology consulted 3. HIV 1. [facility] 2. Cont on HIV anti-retrovirals 4. DVT prophylaxis 1. lovenox 5. Liver lesion on 2d echo 1. LFT's are unremarkable 2. Follow up RUQ US demonstrating normal liver, no lesions seen  Code Status: Full Family Communication: Pt in room Disposition Plan: Pending   Consultants:  Nephrology  ID   Antibiotics:  Vancomycin 11/24>>>  Zosyn 11/24>>>11/26  Ancef 11/26>>>  HPI/Subjective: No acute events noted overnight. Feels well.  Objective: Filed Vitals:   09/10/14 1525 09/10/14 1530 09/10/14 2149 09/11/14 0640  BP: 133/79 147/79 121/75 114/70  Pulse: 99 102 110 106  Temp:  98.5 F (36.9 C) 98.3 F  (36.8 C) 99.2 F (37.3 C)  TempSrc:  Oral Oral Oral  Resp:  15 20 20   Height:      Weight:  66.9 kg (147 lb 7.8 oz)  62.097 kg (136 lb 14.4 oz)  SpO2:  100% 100% 100%    Intake/Output Summary (Last 24 hours) at 09/11/14 1104 Last data filed at 09/11/14 0945  Gross per 24 hour  Intake    480 ml  Output   1800 ml  Net  -1320 ml   Filed Weights   09/10/14 1140 09/10/14 1530 09/11/14 0640  Weight: 63.7 kg (140 lb 6.9 oz) 66.9 kg (147 lb 7.8 oz) 62.097 kg (136 lb 14.4 oz)    Exam:   General:  Awake, in nad, R IJ tunneled cath in place  Cardiovascular: regular, s1, s2  Respiratory: normal resp effort, no wheezing  Abdomen: soft, nondistended  Musculoskeletal: perfused, no clubbing   Data Reviewed: Basic Metabolic Panel:  Recent Labs Lab 09/08/14 1553 09/09/14 0626 09/10/14 0635 09/11/14 0649  NA 135* 138 136* 138  K 3.7 4.0 4.2 4.0  CL 93* 99 101 101  CO2 26 25 22 25   GLUCOSE 123* 97 102* 105*  BUN 30* 35* 36* 19  CREATININE 7.68* 8.41* 8.54* 6.06*  CALCIUM 8.5 7.8* 7.9* 8.1*  PHOS  --   --   --  2.5   Liver Function Tests:  Recent Labs Lab 09/08/14 1553 09/09/14 0626  AST 20 19  ALT 9 11  ALKPHOS 106 99  BILITOT 1.0 0.9  PROT 8.3 7.5  ALBUMIN 3.6 3.2*   No results for  input(s): LIPASE, AMYLASE in the last 168 hours. No results for input(s): AMMONIA in the last 168 hours. CBC:  Recent Labs Lab 09/08/14 1553 09/09/14 0626 09/10/14 0635 09/11/14 0649  WBC 8.3 6.0 7.5 7.1  NEUTROABS 7.2  --   --   --   HGB 9.9* 9.4* 7.6* 8.1*  HCT 28.7* 27.6* 22.5* 24.4*  MCV 102.1* 102.6* 103.7* 102.5*  PLT 129* 126* 146* 192   Cardiac Enzymes: No results for input(s): CKTOTAL, CKMB, CKMBINDEX, TROPONINI in the last 168 hours. BNP (last 3 results) No results for input(s): PROBNP in the last 8760 hours. CBG: No results for input(s): GLUCAP in the last 168 hours.  Recent Results (from the past 240 hour(s))  Blood culture (routine x 2)     Status: None    Collection Time: 09/08/14  4:30 PM  Result Value Ref Range Status   Specimen Description BLOOD RIGHT HAND  Final   Special Requests   Final    BOTTLES DRAWN AEROBIC AND ANAEROBIC AEB=10CC ANA=8CC   Culture  Setup Time   Final    09/09/2014 14:51 Performed at [facility]    Culture   Final    STAPHYLOCOCCUS AUREUS Note: SUSCEPTIBILITIES PERFORMED ON PREVIOUS CULTURE WITHIN THE LAST 5 DAYS. Note: Gram Stain Report Called to,Read Back By and Verified With: [name] [phone] BY HUFFINES 09/09/14 Performed at [facility] Performed at [facility]    Report Status 09/11/2014 FINAL  Final  Blood culture (routine x 2)     Status: None   Collection Time: 09/08/14  4:38 PM  Result Value Ref Range Status   Specimen Description BLOOD LEFT HAND  Final   Special Requests   Final    BOTTLES DRAWN AEROBIC AND ANAEROBIC AEB=14CC ANA=10CC   Culture  Setup Time   Final    09/09/2014 14:46 Performed at [facility]    Culture   Final    METHICILLIN RESISTANT STAPHYLOCOCCUS AUREUS Note: RIFAMPIN AND GENTAMICIN SHOULD NOT BE USED AS SINGLE DRUGS FOR TREATMENT OF STAPH INFECTIONS. CRITICAL RESULT CALLED TO, READ BACK BY AND VERIFIED WITH: [name] 09/10/14 1510 BY [name] This organism DOES NOT demonstrate inducible  Clindamycin resistance in vitro. Note: Gram Stain Report Called to,Read Back By and Verified With: [name] BY HUFFINES S 09/09/14 Performed at [facility] Performed at [facility]    Report Status 09/11/2014 FINAL  Final   Organism ID, Bacteria METHICILLIN RESISTANT STAPHYLOCOCCUS AUREUS  Final      Susceptibility   Methicillin resistant staphylococcus aureus - MIC*    CLINDAMYCIN <=0.25 SENSITIVE Sensitive     ERYTHROMYCIN >=8 RESISTANT Resistant     GENTAMICIN <=0.5 SENSITIVE Sensitive     LEVOFLOXACIN 4 INTERMEDIATE Intermediate     OXACILLIN >=4 RESISTANT Resistant     PENICILLIN >=0.5 RESISTANT Resistant      RIFAMPIN <=0.5 SENSITIVE Sensitive     TRIMETH/SULFA <=10 SENSITIVE Sensitive     VANCOMYCIN <=0.5 SENSITIVE Sensitive     TETRACYCLINE <=1 SENSITIVE Sensitive     * METHICILLIN RESISTANT STAPHYLOCOCCUS AUREUS  Urine culture     Status: None   Collection Time: 09/08/14  5:25 PM  Result Value Ref Range Status   Specimen Description URINE, CLEAN CATCH  Final   Special Requests NONE  Final   Culture  Setup Time   Final    09/08/2014 23:18 Performed at [facility]   Final  30,000 COLONIES/ML Performed at [facility]   Final    Multiple bacterial morphotypes present, none predominant. Suggest appropriate recollection if clinically indicated. Performed at [facility]    Report Status 09/09/2014 FINAL  Final  Culture, blood (routine x 2)     Status: None (Preliminary result)   Collection Time: 09/10/14  2:30 PM  Result Value Ref Range Status   Specimen Description PORTA CATH  Final   Special Requests BOTTLES DRAWN AEROBIC AND ANAEROBIC 10CC EACH  Final   Culture PENDING  Incomplete   Report Status PENDING  Incomplete     Studies: US Abdomen Limited  09/10/2014   CLINICAL DATA:  Indeterminate liver lesion identified on echocardiogram.  EXAM: US ABDOMEN LIMITED - RIGHT UPPER QUADRANT  COMPARISON:  None.  FINDINGS: Common bile duct:  Diameter: Normal diameter.  Liver:  No focal liver lesion identified.  No ductal dilatation.  IMPRESSION: No focal liver lesion identified.   Electronically Signed   By: Genevive Bi M.D.   On: 09/10/2014 16:33    Scheduled Meds: . [START ON 09/12/2014]  ceFAZolin (ANCEF) IV  2 g Intravenous Q T,Th,Sa-HD  . emtricitabine  200 mg Oral Once per day on Mon Fri  . epoetin alfa  10,000 Units Intravenous Q T,Th,Sa-HD  . etravirine  200 mg Oral Q12H  . heparin subcutaneous  5,000 Units Subcutaneous 3 times per day  . raltegravir  400 mg Oral BID  . [START ON 09/14/2014] tenofovir  300 mg Oral Q Mon   . vancomycin  750 mg Intravenous Q T,Th,Sa-HD   Continuous Infusions: . sodium chloride 50 mL/hr at 09/09/14 2104    Principal Problem:   MRSA bacteremia Active Problems:   HIV (human immunodeficiency virus infection)   ESRD on hemodialysis   Staphylococcus aureus bacteremia with sepsis   Macrocytic anemia   Cigarette smoker   Liver lesion   Asthma, chronic   History of MRSA infection  Time spent:  CHIU, STEPHEN K  [facility] Pager [phone]. If 7PM-7AM, please contact night-coverage at [email or web address], password [facility] 09/11/2014, 11:04 AM  LOS: 3 days

## 2014-09-11 NOTE — Progress Notes (Signed)
    [facility] has been requested to perform a transesophageal echocardiogram on 09/14/2014 for recurrent MRSA bacteremia.  After careful review of history and examination, the risks and benefits of transesophageal echocardiogram have been explained including risks of esophageal damage, perforation (1:10,000 risk), bleeding, pharyngeal hematoma as well as other potential complications associated with conscious sedation including aspiration, arrhythmia, respiratory failure and death. Alternatives to treatment were discussed, questions were answered. Patient is willing to proceed.   Dave Estrada has scheduled TEE for Monday 11/30 at 2PM with Dr. Ouida Sills, [place], PA-C 09/11/2014 7:16 PM

## 2014-09-12 ENCOUNTER — Inpatient Hospital Stay (HOSPITAL_COMMUNITY): Payer: Medicaid Other

## 2014-09-12 LAB — CBC
HEMATOCRIT: 23.5 % — AB (ref 39.0–52.0)
Hemoglobin: 7.8 g/dL — ABNORMAL LOW (ref 13.0–17.0)
MCH: 33.5 pg (ref 26.0–34.0)
MCHC: 33.2 g/dL (ref 30.0–36.0)
MCV: 100.9 fL — ABNORMAL HIGH (ref 78.0–100.0)
PLATELETS: 260 10*3/uL (ref 150–400)
RBC: 2.33 MIL/uL — ABNORMAL LOW (ref 4.22–5.81)
RDW: 14.4 % (ref 11.5–15.5)
WBC: 6.4 10*3/uL (ref 4.0–10.5)

## 2014-09-12 LAB — RENAL FUNCTION PANEL
ALBUMIN: 2.8 g/dL — AB (ref 3.5–5.2)
Anion gap: 12 (ref 5–15)
BUN: 19 mg/dL (ref 6–23)
CALCIUM: 8.5 mg/dL (ref 8.4–10.5)
CHLORIDE: 105 meq/L (ref 96–112)
CO2: 22 mEq/L (ref 19–32)
CREATININE: 5.9 mg/dL — AB (ref 0.50–1.35)
GFR calc Af Amer: 14 mL/min — ABNORMAL LOW (ref >90)
GFR, EST NON AFRICAN AMERICAN: 12 mL/min — AB (ref >90)
Glucose, Bld: 126 mg/dL — ABNORMAL HIGH (ref 70–99)
PHOSPHORUS: 2.2 mg/dL — AB (ref 2.3–4.6)
Potassium: 4.2 mEq/L (ref 3.7–5.3)
Sodium: 139 mEq/L (ref 137–147)

## 2014-09-12 MED ORDER — SODIUM CHLORIDE 0.9 % IV SOLN: 100.0000 mL | INTRAVENOUS | Status: DC | PRN

## 2014-09-12 MED ORDER — LIDOCAINE HCL (PF) 1 % IJ SOLN: 5.0000 mL | INTRAMUSCULAR | Status: DC | PRN

## 2014-09-12 MED ORDER — ETRAVIRINE 100 MG PO TABS
200.0000 mg | ORAL_TABLET | Freq: Two times a day (BID) | ORAL | Status: DC
  Administered 2014-09-13 (×2): 200 mg via ORAL
  Filled 2014-09-12 (×7): qty 2

## 2014-09-12 MED ORDER — PENTAFLUOROPROP-TETRAFLUOROETH EX AERO: 1.0000 "application " | INHALATION_SPRAY | CUTANEOUS | Status: DC | PRN

## 2014-09-12 MED ORDER — NEPRO/CARBSTEADY PO LIQD: 237.0000 mL | ORAL | Status: DC | PRN

## 2014-09-12 MED ORDER — CHLORHEXIDINE GLUCONATE 4 % EX LIQD
CUTANEOUS | Status: AC
  Filled 2014-09-12: qty 15

## 2014-09-12 MED ORDER — ALTEPLASE 2 MG IJ SOLR
2.0000 mg | Freq: Once | INTRAMUSCULAR | Status: DC | PRN
Start: 2014-09-12 — End: 2014-09-12
  Filled 2014-09-12: qty 2

## 2014-09-12 MED ORDER — HEPARIN SODIUM (PORCINE) 1000 UNIT/ML DIALYSIS: 1000.0000 [IU] | INTRAMUSCULAR | Status: DC | PRN

## 2014-09-12 MED ORDER — LIDOCAINE-PRILOCAINE 2.5-2.5 % EX CREA
1.0000 | TOPICAL_CREAM | CUTANEOUS | Status: DC | PRN
Start: 2014-09-12 — End: 2014-09-12

## 2014-09-12 MED ORDER — LIDOCAINE HCL 1 % IJ SOLN
INTRAMUSCULAR | Status: AC
  Filled 2014-09-12: qty 20

## 2014-09-12 MED ORDER — ETRAVIRINE 100 MG PO TABS
200.0000 mg | ORAL_TABLET | Freq: Once | ORAL | Status: AC
  Administered 2014-09-12: 200 mg via ORAL
  Filled 2014-09-12: qty 2

## 2014-09-12 NOTE — Plan of Care (Signed)
Problem: Consults Goal: General Medical Patient Education See Patient Education Module for specific education.  Outcome: Completed/Met Date Met:  09/12/14 Goal: Skin Care Protocol Initiated - if Braden Score 18 or less If consults are not indicated, leave blank or document N/A  Outcome: Not Applicable Date Met:  53/64/68 Goal: Nutrition Consult-if indicated Outcome: Not Applicable Date Met:  01/03/21 Goal: Diabetes Guidelines if Diabetic/Glucose > 140 If diabetic or lab glucose is > 140 mg/dl - Initiate Diabetes/Hyperglycemia Guidelines & Document Interventions  Outcome: Not Applicable Date Met:  48/25/00  Problem: Phase II Progression Outcomes Goal: Progress activity as tolerated unless otherwise ordered Outcome: Completed/Met Date Met:  09/12/14 Goal: Vital signs remain stable Outcome: Completed/Met Date Met:  09/12/14 Goal: IV changed to normal saline lock Outcome: Completed/Met Date Met:  09/12/14 Goal: Obtain order to discontinue catheter if appropriate Outcome: Not Applicable Date Met:  37/04/88

## 2014-09-12 NOTE — Procedures (Signed)
I have seen and examined this patient and agree with the plan of care . Patient seen on dialysis  The plan would be to remove the catheter after dialysis [facility] W 09/12/2014, 8:41 AM

## 2014-09-12 NOTE — Progress Notes (Signed)
PROGRESS NOTE  Dave Estrada ZOX:[id number] DOB: June 29, 1991 DOA: 09/08/2014 PCP: No primary care provider on file. Brief history 23 year old male with a history of ESRD, HIV presented with fevers and chills.  He was found to have MRSA bacteremia. The patient's antibiotics were narrowed to vancomycin. Infectious disease followed the patient. Given the complexity of the situation, Nephrology and Cardiology have both recommended transfer to [facility] to complete work up.  The patient was transferred to [facility] on 09/11/2014. TEE is scheduled 09/14/2014. His tunnel dialysis catheter will be removed after his HD on 09/12/2014. 09/10/2014 surveillance blood cultures remain negative.  Assessment/Plan: Sepsis -Present at the time of admission -Secondary to bacteremia -Improved with antibiotics MRSA bacteremia -Dialysis catheter is the likely source -09/10/2014 Surveillance blood cultures remain negative -Case was discussed with Dr. Marvell Fuller catheter to be removed after treatment today -Place new dialysis catheter if surveillance blood cultures remain negative -Continue intravenous vancomycin -TEE scheduled 09/14/2014 ESRD -Dialysis Tuesday, Thursday, Saturday -Appreciate nephrology follow-up AIDS -Continue antiretroviral therapy -Last CD4 count 179 on 06/17/2014 -Last HIV RNA undetectable--06/17/2014 -Restart Bactrim prophylaxis Liver lesion on 2d echo -LFT's are unremarkable -Follow up RUQ US demonstrating normal liver, no lesions seen   Family Communication:   Pt at beside Disposition Plan:   Home when medically stable       Procedures/Studies: Dg Chest 2 View  09/08/2014   CLINICAL DATA:  Fever for 2 days with cough  EXAM: CHEST  2 VIEW  COMPARISON:  None.  FINDINGS: Cardiac shadow is within normal limits. A right jugular dialysis catheter is seen in satisfactory position. The lungs are clear bilaterally. No effusion is noted. No acute bony abnormality is seen.   IMPRESSION: No acute abnormality noted.   Electronically Signed   By: Alcide Clever M.D.   On: 09/08/2014 17:13   Ct Head Wo Contrast  09/08/2014   CLINICAL DATA:  Headaches and fevers for 2 days  EXAM: CT HEAD WITHOUT CONTRAST  TECHNIQUE: Contiguous axial images were obtained from the base of the skull through the vertex without intravenous contrast.  COMPARISON:  None.  FINDINGS: Bony calvarium is intact. The paranasal sinuses show partial opacification in the maxillary antra as well as some thickening of the nasal passages on the left. May be related to acute sinusitis. No findings to suggest acute hemorrhage, acute infarction or space-occupying mass lesion are noted.  IMPRESSION: Changes in the maxillary antra and left sinus passages likely related to a degree of sinusitis. No other focal abnormality is noted.   Electronically Signed   By: Alcide Clever M.D.   On: 09/08/2014 18:16   US Abdomen Limited  09/10/2014   CLINICAL DATA:  Indeterminate liver lesion identified on echocardiogram.  EXAM: US ABDOMEN LIMITED - RIGHT UPPER QUADRANT  COMPARISON:  None.  FINDINGS: Common bile duct:  Diameter: Normal diameter.  Liver:  No focal liver lesion identified.  No ductal dilatation.  IMPRESSION: No focal liver lesion identified.   Electronically Signed   By: Genevive Bi M.D.   On: 09/10/2014 16:33         Subjective: Patient denies fevers, chills, headache, chest pain, dyspnea, nausea, vomiting, diarrhea, abdominal pain, dysuria, hematuria   Objective: Filed Vitals:   09/12/14 0930 09/12/14 1000 09/12/14 1030 09/12/14 1100  BP: 132/83 129/72 115/80 121/79  Pulse: 90 92 103 92  Temp:      TempSrc:      Resp:  Height:      Weight:      SpO2:        Intake/Output Summary (Last 24 hours) at 09/12/14 1112 Last data filed at 09/12/14 0813  Gross per 24 hour  Intake    700 ml  Output      0 ml  Net    700 ml   Weight change: -1.361 kg (-3 lb) Exam:   General:  Pt is alert,  follows commands appropriately, not in acute distress  HEENT: No icterus, No thrush, No neck pain or meningismus, [place]/AT  Cardiovascular: RRR, S1/S2, no rubs, no gallops  Respiratory: Bibasilar crackles. No wheezing. Good air movement  Abdomen: Soft/+BS, non tender, non distended, no guarding  Extremities: No edema, No lymphangitis, No petechiae, No rashes, no synovitis  Data Reviewed: Basic Metabolic Panel:  Recent Labs Lab 09/08/14 1553 09/09/14 0626 09/10/14 0635 09/11/14 0649 09/12/14 0822  NA 135* 138 136* 138 139  K 3.7 4.0 4.2 4.0 4.2  CL 93* 99 101 101 105  CO2 26 25 22 25 22   GLUCOSE 123* 97 102* 105* 126*  BUN 30* 35* 36* 19 19  CREATININE 7.68* 8.41* 8.54* 6.06* 5.90*  CALCIUM 8.5 7.8* 7.9* 8.1* 8.5  PHOS  --   --   --  2.5 2.2*   Liver Function Tests:  Recent Labs Lab 09/08/14 1553 09/09/14 0626 09/12/14 0822  AST 20 19  --   ALT 9 11  --   ALKPHOS 106 99  --   BILITOT 1.0 0.9  --   PROT 8.3 7.5  --   ALBUMIN 3.6 3.2* 2.8*   No results for input(s): LIPASE, AMYLASE in the last 168 hours. No results for input(s): AMMONIA in the last 168 hours. CBC:  Recent Labs Lab 09/08/14 1553 09/09/14 0626 09/10/14 0635 09/11/14 0649 09/12/14 0823  WBC 8.3 6.0 7.5 7.1 6.4  NEUTROABS 7.2  --   --   --   --   HGB 9.9* 9.4* 7.6* 8.1* 7.8*  HCT 28.7* 27.6* 22.5* 24.4* 23.5*  MCV 102.1* 102.6* 103.7* 102.5* 100.9*  PLT 129* 126* 146* 192 260   Cardiac Enzymes: No results for input(s): CKTOTAL, CKMB, CKMBINDEX, TROPONINI in the last 168 hours. BNP: Invalid input(s): POCBNP CBG: No results for input(s): GLUCAP in the last 168 hours.  Recent Results (from the past 240 hour(s))  Blood culture (routine x 2)     Status: None   Collection Time: 09/08/14  4:30 PM  Result Value Ref Range Status   Specimen Description BLOOD RIGHT HAND  Final   Special Requests   Final    BOTTLES DRAWN AEROBIC AND ANAEROBIC AEB=10CC ANA=8CC   Culture  Setup Time   Final     09/09/2014 14:51 [facility]formed at [facility]    Culture   Final    STAPHYLOCOCCUS AUREUS Note: SUSCEPTIBILITIES PERFORMED ON PREVIOUS CULTURE WITHIN THE LAST 5 DAYS. Note: Gram Stain Report Called to,Read Back By and Verified With: Loralie ChampagneMCKINNEY S [phone] BY HUFFINES 09/09/14 Performed at [facility] Performed at [facility]    Report Status 09/11/2014 FINAL  Final  Blood culture (routine x 2)     Status: None   Collection Time: 09/08/14  4:38 PM  Result Value Ref Range Status   Specimen Description BLOOD LEFT HAND  Final   Special Requests   Final    BOTTLES DRAWN AEROBIC AND ANAEROBIC AEB=14CC ANA=10CC   Culture  Setup Time   Final  09/09/2014 14:46 Performed at [facility]    Culture   Final    METHICILLIN RESISTANT STAPHYLOCOCCUS AUREUS Note: RIFAMPIN AND GENTAMICIN SHOULD NOT BE USED AS SINGLE DRUGS FOR TREATMENT OF STAPH INFECTIONS. CRITICAL RESULT CALLED TO, READ BACK BY AND VERIFIED WITH: [name] 09/10/14 1510 BY [name] This organism DOES NOT demonstrate inducible  Clindamycin resistance in vitro. Note: Gram Stain Report Called to,Read Back By and Verified With: [facility] S AT 0727 BY HUFFINES S 09/09/14 Performed at [facility] Performed at [facility]    Report Status 09/11/2014 FINAL  Final   Organism ID, Bacteria METHICILLIN RESISTANT STAPHYLOCOCCUS AUREUS  Final      Susceptibility   Methicillin resistant staphylococcus aureus - MIC*    CLINDAMYCIN <=0.25 SENSITIVE Sensitive     ERYTHROMYCIN >=8 RESISTANT Resistant     GENTAMICIN <=0.5 SENSITIVE Sensitive     LEVOFLOXACIN 4 INTERMEDIATE Intermediate     OXACILLIN >=4 RESISTANT Resistant     PENICILLIN >=0.5 RESISTANT Resistant     RIFAMPIN <=0.5 SENSITIVE Sensitive     TRIMETH/SULFA <=10 SENSITIVE Sensitive     VANCOMYCIN <=0.5 SENSITIVE Sensitive     TETRACYCLINE <=1 SENSITIVE Sensitive     * METHICILLIN RESISTANT STAPHYLOCOCCUS AUREUS  Urine culture      Status: None   Collection Time: 09/08/14  5:25 PM  Result Value Ref Range Status   Specimen Description URINE, CLEAN CATCH  Final   Special Requests NONE  Final   Culture  Setup Time   Final    09/08/2014 23:18 Performed at [facility]   Final    30,000 COLONIES/ML Performed at [facility]    Culture   Final    Multiple bacterial morphotypes present, none predominant. Suggest appropriate recollection if clinically indicated. Performed at [facility]    Report Status 09/09/2014 FINAL  Final  Culture, blood (routine x 2)     Status: None (Preliminary result)   Collection Time: 09/10/14  1:40 PM  Result Value Ref Range Status   Specimen Description PORTA CATH  Final   Special Requests BOTTLES DRAWN AEROBIC AND ANAEROBIC 10CC  Final   Culture NO GROWTH 2 DAYS  Final   Report Status PENDING  Incomplete  Culture, blood (routine x 2)     Status: None (Preliminary result)   Collection Time: 09/10/14  2:30 PM  Result Value Ref Range Status   Specimen Description PORTA CATH  Final   Special Requests BOTTLES DRAWN AEROBIC AND ANAEROBIC 10CC EACH  Final   Culture NO GROWTH 2 DAYS  Final   Report Status PENDING  Incomplete     Scheduled Meds: .  ceFAZolin (ANCEF) IV  2 g Intravenous Q T,Th,Sa-HD  . emtricitabine  200 mg Oral Once per day on Mon Fri  . epoetin alfa  10,000 Units Intravenous Q T,Th,Sa-HD  . etravirine  200 mg Oral Q12H  . heparin subcutaneous  5,000 Units Subcutaneous 3 times per day  . raltegravir  400 mg Oral BID  . [START ON 09/14/2014] tenofovir  300 mg Oral Q Mon  . vancomycin  750 mg Intravenous Q T,Th,Sa-HD   Continuous Infusions:    Thierry Dobosz, DO  [facility] Pager [phone]  If 7PM-7AM, please contact night-coverage [email or web address] Password [email or web address] 09/12/2014, 11:12 AM   LOS: 4 days

## 2014-09-12 NOTE — Consult Note (Signed)
Referring Provider: No ref. provider found Primary Care Physician:  No primary care provider on file. Primary Nephrologist:  Dr. 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 Patient presented today history of fever associated with nasal congestion, cough. Fever was high as 103. Blood cultures were positive for staph bacteremia and the patient was appropriately treated with antimicrobials Vanc and  Ancef was [facility] TEE I   Past Medical History  Diagnosis Date  . HIV (human immunodeficiency virus infection)   . ESRD (end stage renal disease)     Past Surgical History  Procedure Laterality Date  . Port a cath revision      Prior to Admission medications   Medication Sig Start Date End Date Taking? Authorizing Provider  emtricitabine (EMTRIVA) 200 MG capsule Take 200 mg by mouth 2 (two) times a week. Taken after dialysis on Mondays and Fridays   Yes Historical Provider, MD  etravirine (INTELENCE) 100 MG tablet Take 200 mg by mouth every 12 (twelve) hours.   Yes Historical Provider, MD  raltegravir (ISENTRESS) 400 MG tablet Take 400 mg by mouth 2 (two) times daily.   Yes Historical Provider, MD  tenofovir (VIREAD) 300 MG tablet Take 300 mg by mouth every Monday. To be taken on Mondays after dialysis   Yes Historical Provider, MD  predniSONE (DELTASONE) 20 MG tablet 3 tabs po daily x 3 days, then 2 tabs x 3 days, then 1.5 tabs x 3 days, then 1 tab x 3 days, then 0.5 tabs x 3 days Patient not taking: Reported on 09/08/2014 01/08/13   Donnetta Hutching, MD  predniSONE (DELTASONE) 20 MG tablet 3 tabs po daily x 3 days, then 2 tabs x 3 days, then 1.5 tabs x 3 days, then 1 tab x 3 days, then 0.5 tabs x 3  days Patient not taking: Reported on 09/08/2014 01/08/13   Donnetta Hutching, MD  traMADol (ULTRAM) 50 MG tablet Take 1 tablet (50 mg total) by mouth every 6 (six) hours as needed for pain. Patient not taking: Reported on 09/08/2014 01/08/13   Donnetta Hutching, MD    Current Facility-Administered Medications  Medication Dose Route Frequency Provider Last Rate Last Dose  . 0.9 %  sodium chloride infusion  100 mL Intravenous PRN Manpreet Lucio Edward, MD      . 0.9 %  sodium chloride infusion  100 mL Intravenous PRN Manpreet Lucio Edward, MD      . acetaminophen (TYLENOL) tablet 650 mg  650 mg Oral Q6H PRN Meredeth Ide, MD   650 mg at 09/09/14 1559   Or  . acetaminophen (TYLENOL) suppository 650 mg  650 mg Rectal Q6H PRN Meredeth Ide, MD      . alteplase (CATHFLO ACTIVASE) injection 2 mg  2 mg Intracatheter Once PRN Garnetta Buddy, MD      . ceFAZolin (ANCEF) IVPB 2 g/50 mL premix  2 g Intravenous Q T,Th,Sa-HD Cliffton Asters, MD      . emtricitabine (EMTRIVA) capsule 200 mg  200 mg Oral Once per day on Mon Fri Meredeth Ide, MD   200 mg at 09/11/14 1215  . epoetin alfa (EPOGEN,PROCRIT) injection 10,000 Units  10,000 Units Intravenous Q T,Th,Sa-HD Jamse Mead, MD  10,000 Units at 09/10/14 1300  . etravirine (INTELENCE) tablet 200 mg  200 mg Oral Q12H Meredeth IdeGagan S Lama, MD   200 mg at 09/11/14 2138  . feeding supplement (NEPRO CARB STEADY) liquid 237 mL  237 mL Oral PRN Randa LynnManpreet S Bhutani, MD      . heparin injection 1,000 Units  1,000 Units Dialysis PRN Garnetta BuddyMartin W Kinleigh Nault, MD      . heparin injection 1,300 Units  20 Units/kg Dialysis PRN Randa LynnManpreet S Bhutani, MD   1,300 Units at 09/10/14 1324  . heparin injection 3,800 Units  3,800 Units Dialysis PRN Jamse MeadBelayenh S Befekadu, MD   3,800 Units at 09/10/14 1545  . heparin injection 5,000 Units  5,000 Units Subcutaneous 3 times per day Meredeth IdeGagan S Lama, MD   5,000 Units at 09/08/14 2335  . lidocaine (PF) (XYLOCAINE) 1 % injection 5 mL  5 mL Intradermal PRN Manpreet Lucio EdwardS Bhutani, MD       . lidocaine-prilocaine (EMLA) cream 1 application  1 application Topical PRN Manpreet Lucio EdwardS Bhutani, MD      . ondansetron (ZOFRAN) tablet 4 mg  4 mg Oral Q6H PRN Meredeth IdeGagan S Lama, MD       Or  . ondansetron (ZOFRAN) injection 4 mg  4 mg Intravenous Q6H PRN Meredeth IdeGagan S Lama, MD      . pentafluoroprop-tetrafluoroeth (GEBAUERS) aerosol 1 application  1 application Topical PRN Manpreet Lucio EdwardS Bhutani, MD      . raltegravir (ISENTRESS) tablet 400 mg  400 mg Oral BID Meredeth IdeGagan S Lama, MD   400 mg at 09/11/14 2139  . sodium chloride 0.9 % injection 10 mL  10 mL Intravenous PRN Jamse MeadBelayenh S Befekadu, MD   10 mL at 09/10/14 1530  . sodium chloride 0.9 % injection 10 mL  10 mL Intravenous PRN Jamse MeadBelayenh S Befekadu, MD   10 mL at 09/10/14 1530  . [START ON 09/14/2014] tenofovir (VIREAD) tablet 300 mg  300 mg Oral Q Mon Meredeth IdeGagan S Lama, MD      . vancomycin (VANCOCIN) IVPB 750 mg/150 ml premix  750 mg Intravenous Q T,Th,Sa-HD [facility] Ridingndrea Michelle Lilliston, RPH   750 mg at 09/10/14 1330    Allergies as of 09/08/2014 - Review Complete 09/08/2014  Allergen Reaction Noted  . Aspirin Other (See Comments) 01/08/2013    Family History  Problem Relation Age of Onset  . Seizures Father     History   Social History  . Marital Status: Single    Spouse Name: N/A    Number of Children: N/A  . Years of Education: N/A   Occupational History  . Not on file.   Social History Main Topics  . Smoking status: Current Every Day Smoker -- 1.00 packs/day for 11 years    Types: Cigarettes  . Smokeless tobacco: Never Used  . Alcohol Use: No  . Drug Use: Yes    Special: Marijuana     Comment: has not used x1 week  . Sexual Activity: Not on file   Other Topics Concern  . Not on file   Social History Narrative    Review of Systems: Gen: Denies any fever, chills, sweats, anorexia, fatigue, weakness, malaise, weight loss, and sleep disorder HEENT: No visual complaints, No history of Retinopathy. Normal external appearance No Epistaxis  or Sore throat. No sinusitis.   CV: Denies chest pain, angina, palpitations, syncope, orthopnea, PND, peripheral edema, and claudication. Resp: Denies dyspnea at rest, dyspnea with exercise, cough, sputum, wheezing, coughing up blood, and pleurisy. GI:  Denies vomiting blood, jaundice, and fecal incontinence.   Denies dysphagia or odynophagia. GU : Denies urinary burning, blood in urine, urinary frequency, urinary hesitancy, nocturnal urination, and urinary incontinence.  No renal calculi. MS: Denies joint pain, limitation of movement, and swelling, stiffness, low back pain, extremity pain. Denies muscle weakness, cramps, atrophy.  No use of non steroidal antiinflammatory drugs. Derm: Denies rash, itching, dry skin, hives, moles, warts, or unhealing ulcers.  Psych: Denies depression, anxiety, memory loss, suicidal ideation, hallucinations, paranoia, and confusion. Heme: Denies bruising, bleeding, and enlarged lymph nodes. Neuro: No headache.  No diplopia. No dysarthria.  No dysphasia.  No history of CVA.  No Seizures. No paresthesias.  No weakness. Endocrine No DM.  No Thyroid disease.  No Adrenal disease.  Physical Exam: Vital signs in last 24 hours: Temp:  [98.1 F (36.7 C)-98.7 F (37.1 C)] 98.4 F (36.9 C) (11/28 0817) Pulse Rate:  [74-95] 77 (11/28 0830) Resp:  [18-20] 18 (11/28 0817) BP: (134-142)/(71-95) 136/87 mmHg (11/28 0830) SpO2:  [95 %-100 %] 100 % (11/28 0817) Weight:  [62.097 kg (136 lb 14.4 oz)-64.3 kg (141 lb 12.1 oz)] 64.3 kg (141 lb 12.1 oz) (11/28 0817) Last BM Date: 09/11/14 General:   Alert,  Well-developed, well-nourished, pleasant and cooperative in NAD Head:  Normocephalic and atraumatic. Eyes:  Sclera clear, no icterus.   Conjunctiva pink. Ears:  Normal auditory acuity. Nose:  No deformity, discharge,  or lesions. Mouth:  No deformity or lesions, dentition normal. Neck:  Supple; no masses or thyromegaly. JVP not elevated Lungs:  Clear throughout to  auscultation.   No wheezes, crackles, or rhonchi. No acute distress. I J catheter Heart:  Regular rate and rhythm; no murmurs, clicks, rubs,  or gallops. Abdomen:  Soft, nontender and nondistended. No masses, hepatosplenomegaly or hernias noted. Normal bowel sounds, without guarding, and without rebound.   Msk:  Symmetrical without gross deformities. Normal posture. Pulses:  No carotid, renal, femoral bruits. DP and PT symmetrical and equal Extremities:  Without clubbing or edema. Neurologic:  Alert and  oriented x4;  grossly normal neurologically. Skin: Tattooed  Cervical Nodes:  No significant cervical adenopathy. Psych:  Alert and cooperative. Normal mood and affect.  Intake/Output from previous day: 11/27 0701 - 11/28 0700 In: 580 [P.O.:580] Out: 0  Intake/Output this shift: Total I/O In: 240 [P.O.:240] Out: -   Lab Results:  Recent Labs  09/10/14 0635 09/11/14 0649  WBC 7.5 7.1  HGB 7.6* 8.1*  HCT 22.5* 24.4*  PLT 146* 192   BMET  Recent Labs  09/10/14 0635 09/11/14 0649  NA 136* 138  K 4.2 4.0  CL 101 101  CO2 22 25  GLUCOSE 102* 105*  BUN 36* 19  CREATININE 8.54* 6.06*  CALCIUM 7.9* 8.1*  PHOS  --  2.5   LFT No results for input(s): PROT, ALBUMIN, AST, ALT, ALKPHOS, BILITOT, BILIDIR, IBILI in the last 72 hours. PT/INR No results for input(s): LABPROT, INR in the last 72 hours. Hepatitis Panel  Recent Labs  09/10/14 0545  HEPBSAG NEGATIVE    Studies/Results: US Abdomen Limited  09/10/2014   CLINICAL DATA:  Indeterminate liver lesion identified on echocardiogram.  EXAM: US ABDOMEN LIMITED - RIGHT UPPER QUADRANT  COMPARISON:  None.  FINDINGS: Common bile duct:  Diameter: Normal diameter.  Liver:  No focal liver lesion identified.  No ductal dilatation.  IMPRESSION: No focal liver lesion identified.   Electronically Signed   By: Genevive Bi M.D.   On: 09/10/2014 16:33  Assessment/Plan:  ESRD- TTS [facility]  ANEMIA- Hb 8.1   ESA treatment would hold on IV iron in setting of bacteremia  MBD- Continue binders and calcitriol  HTN/VOL-Well controlled  ACCESS- I J catheter will need to be removed    LOS: 4 Dave Estrada W @TODAY @8 :42 AM

## 2014-09-12 NOTE — Progress Notes (Signed)
    Consulting cardiologist: Dr. Jonelle Sidle  Seen for followup: MRSA bacteremia - request for TEE  Subjective:    Patient in HD this AM without specific complaints.  Objective:   Temp:  [98.1 F (36.7 C)-98.7 F (37.1 C)] 98.4 F (36.9 C) (11/28 0817) Pulse Rate:  [74-95] 77 (11/28 0830) Resp:  [18-20] 18 (11/28 0817) BP: (134-142)/(71-95) 136/87 mmHg (11/28 0830) SpO2:  [95 %-100 %] 100 % (11/28 0817) Weight:  [136 lb 14.4 oz (62.097 kg)-141 lb 12.1 oz (64.3 kg)] 141 lb 12.1 oz (64.3 kg) (11/28 0817) Last BM Date: 09/11/14  Filed Weights   09/11/14 0640 09/11/14 2100 09/12/14 0817  Weight: 136 lb 14.4 oz (62.097 kg) 136 lb 14.4 oz (62.097 kg) 141 lb 12.1 oz (64.3 kg)    Intake/Output Summary (Last 24 hours) at 09/12/14 0858 Last data filed at 09/12/14 0813  Gross per 24 hour  Intake    820 ml  Output      0 ml  Net    820 ml    Exam:  General: Appears comfortable at rest.  Lungs: Clear, nonlabored.  Thorax: Port-a-cath on right.  Cardiac: RRR, no gallop.  Abdomen: NABS.  Extremities: No pitting.   Lab Results:  Basic Metabolic Panel:  Recent Labs Lab 09/09/14 0626 09/10/14 0635 09/11/14 0649  NA 138 136* 138  K 4.0 4.2 4.0  CL 99 101 101  CO2 25 22 25   GLUCOSE 97 102* 105*  BUN 35* 36* 19  CREATININE 8.41* 8.54* 6.06*  CALCIUM 7.8* 7.9* 8.1*    Liver Function Tests:  Recent Labs Lab 09/08/14 1553 09/09/14 0626  AST 20 19  ALT 9 11  ALKPHOS 106 99  BILITOT 1.0 0.9  PROT 8.3 7.5  ALBUMIN 3.6 3.2*    CBC:  Recent Labs Lab 09/10/14 0635 09/11/14 0649 09/12/14 0823  WBC 7.5 7.1 6.4  HGB 7.6* 8.1* 7.8*  HCT 22.5* 24.4* 23.5*  MCV 103.7* 102.5* 100.9*  PLT 146* 192 260     Medications:   Scheduled Medications: .  ceFAZolin (ANCEF) IV  2 g Intravenous Q T,Th,Sa-HD  . emtricitabine  200 mg Oral Once per day on Mon Fri  . epoetin alfa  10,000 Units Intravenous Q T,Th,Sa-HD  . etravirine  200 mg Oral Q12H  . heparin  subcutaneous  5,000 Units Subcutaneous 3 times per day  . raltegravir  400 mg Oral BID  . [START ON 09/14/2014] tenofovir  300 mg Oral Q Mon  . vancomycin  750 mg Intravenous Q T,Th,Sa-HD     PRN Medications:  sodium chloride, sodium chloride, acetaminophen **OR** acetaminophen, alteplase, feeding supplement (NEPRO CARB STEADY), heparin, heparin, heparin, lidocaine (PF), lidocaine-prilocaine, ondansetron **OR** ondansetron (ZOFRAN) IV, pentafluoroprop-tetrafluoroeth, sodium chloride, sodium chloride   Assessment:   1. Patient with MRSA bacteremia, Port-A-Cath suspected as potential source, although TEE requested to clearly exclude any valvular vegetations per recommendation of Infectious Disease. Followup blood cultures per catheter are negative so far.  2. End-stage renal disease, on hemodialysis.  3. HIV, on antiretrovirals, followed at [facility].   Plan/Discussion:    TEE scheduled for Monday with Dr. Shirlee Latch. Service to see again Monday.   Jonelle Sidle, M.D., F.A.C.C.

## 2014-09-12 NOTE — Procedures (Signed)
Tunneled right IJ HD catheter removed in its entirety without immediate complications. Gauze dressing applied to site. Medication utilized- 1% lidocaine to skin/SQ tissue.

## 2014-09-13 DIAGNOSIS — Z720 Tobacco use: Secondary | ICD-10-CM

## 2014-09-13 DIAGNOSIS — B20 Human immunodeficiency virus [HIV] disease: Secondary | ICD-10-CM

## 2014-09-13 MED ORDER — SULFAMETHOXAZOLE-TRIMETHOPRIM 800-160 MG PO TABS
1.0000 | ORAL_TABLET | Freq: Once | ORAL | Status: AC
  Administered 2014-09-13: 1 via ORAL
  Filled 2014-09-13: qty 1

## 2014-09-13 NOTE — Progress Notes (Signed)
[facility] ROUNDING NOTE   Subjective:   Interval History: no complaints this morning watching TV and eating breakfast  Objective:  Vital signs in last 24 hours:  Temp:  [97.8 F (36.6 C)-98.3 F (36.8 C)] 98.3 F (36.8 C) (11/29 1610) Pulse Rate:  [84-106] 91 (11/29 0608) Resp:  [17-20] 17 (11/29 0608) BP: (99-134)/(52-88) 99/52 mmHg (11/29 0608) SpO2:  [96 %-100 %] 96 % (11/29 0608) Weight:  [62 kg (136 lb 11 oz)-62.596 kg (138 lb)] 62.596 kg (138 lb) (11/28 2059)  Weight change: 2.202 kg (4 lb 13.7 oz) Filed Weights   09/12/14 0817 09/12/14 1114 09/12/14 2059  Weight: 64.3 kg (141 lb 12.1 oz) 62 kg (136 lb 11 oz) 62.596 kg (138 lb)    Intake/Output: I/O last 3 completed shifts: In: 1060 [P.O.:940; I.V.:120] Out: 1842 [Other:1842]   Intake/Output this shift:     CVS- RRR RS- CTA ABD- BS present soft non-distended EXT- no edema   Basic Metabolic Panel:  Recent Labs Lab 09/08/14 1553 09/09/14 0626 09/10/14 0635 09/11/14 0649 09/12/14 0822  NA 135* 138 136* 138 139  K 3.7 4.0 4.2 4.0 4.2  CL 93* 99 101 101 105  CO2 26 25 22 25 22   GLUCOSE 123* 97 102* 105* 126*  BUN 30* 35* 36* 19 19  CREATININE 7.68* 8.41* 8.54* 6.06* 5.90*  CALCIUM 8.5 7.8* 7.9* 8.1* 8.5  PHOS  --   --   --  2.5 2.2*    Liver Function Tests:  Recent Labs Lab 09/08/14 1553 09/09/14 0626 09/12/14 0822  AST 20 19  --   ALT 9 11  --   ALKPHOS 106 99  --   BILITOT 1.0 0.9  --   PROT 8.3 7.5  --   ALBUMIN 3.6 3.2* 2.8*   No results for input(s): LIPASE, AMYLASE in the last 168 hours. No results for input(s): AMMONIA in the last 168 hours.  CBC:  Recent Labs Lab 09/08/14 1553 09/09/14 0626 09/10/14 0635 09/11/14 0649 09/12/14 0823  WBC 8.3 6.0 7.5 7.1 6.4  NEUTROABS 7.2  --   --   --   --   HGB 9.9* 9.4* 7.6* 8.1* 7.8*  HCT 28.7* 27.6* 22.5* 24.4* 23.5*  MCV 102.1* 102.6* 103.7* 102.5* 100.9*  PLT 129* 126* 146* 192 260    Cardiac Enzymes: No  results for input(s): CKTOTAL, CKMB, CKMBINDEX, TROPONINI in the last 168 hours.  BNP: Invalid input(s): POCBNP  CBG: No results for input(s): GLUCAP in the last 168 hours.  Microbiology: Results for orders placed or performed during the hospital encounter of 09/08/14  Blood culture (routine x 2)     Status: None   Collection Time: 09/08/14  4:30 PM  Result Value Ref Range Status   Specimen Description BLOOD RIGHT HAND  Final   Special Requests   Final    BOTTLES DRAWN AEROBIC AND ANAEROBIC AEB=10CC ANA=8CC   Culture  Setup Time   Final    09/09/2014 14:51 Performed at [facility]    Culture   Final    STAPHYLOCOCCUS AUREUS Note: SUSCEPTIBILITIES PERFORMED ON PREVIOUS CULTURE WITHIN THE LAST 5 DAYS. Note: Gram Stain Report Called to,Read Back By and Verified With: [name] [phone] BY HUFFINES 09/09/14 Performed at [facility] Performed at [facility]    Report Status 09/11/2014 FINAL  Final  Blood culture (routine x 2)     Status: None   Collection Time: 09/08/14  4:38 PM  Result  Value Ref Range Status   Specimen Description BLOOD LEFT HAND  Final   Special Requests   Final    BOTTLES DRAWN AEROBIC AND ANAEROBIC AEB=14CC ANA=10CC   Culture  Setup Time   Final    09/09/2014 14:46 [facility]formed at [facility]    Culture   Final    METHICILLIN RESISTANT STAPHYLOCOCCUS AUREUS Note: RIFAMPIN AND GENTAMICIN SHOULD NOT BE USED AS SINGLE DRUGS FOR TREATMENT OF STAPH INFECTIONS. CRITICAL RESULT CALLED TO, READ BACK BY AND VERIFIED WITH: [name] 09/10/14 1510 BY [name] This organism DOES NOT demonstrate inducible  Clindamycin resistance in vitro. Note: Gram Stain Report Called to,Read Back By and Verified With: [facility] AT 0727 BY HUFFINES S 09/09/14 Performed at [facility] Performed at [facility]    Report Status 09/11/2014 FINAL  Final   Organism ID, Bacteria METHICILLIN RESISTANT STAPHYLOCOCCUS AUREUS  Final       Susceptibility   Methicillin resistant staphylococcus aureus - MIC*    CLINDAMYCIN <=0.25 SENSITIVE Sensitive     ERYTHROMYCIN >=8 RESISTANT Resistant     GENTAMICIN <=0.5 SENSITIVE Sensitive     LEVOFLOXACIN 4 INTERMEDIATE Intermediate     OXACILLIN >=4 RESISTANT Resistant     PENICILLIN >=0.5 RESISTANT Resistant     RIFAMPIN <=0.5 SENSITIVE Sensitive     TRIMETH/SULFA <=10 SENSITIVE Sensitive     VANCOMYCIN <=0.5 SENSITIVE Sensitive     TETRACYCLINE <=1 SENSITIVE Sensitive     * METHICILLIN RESISTANT STAPHYLOCOCCUS AUREUS  Urine culture     Status: None   Collection Time: 09/08/14  5:25 PM  Result Value Ref Range Status   Specimen Description URINE, CLEAN CATCH  Final   Special Requests NONE  Final   Culture  Setup Time   Final    09/08/2014 23:18 Performed at [facility]    Colony Count   Final    30,000 COLONIES/ML [facility]formed at [facility]    Culture   Final    Multiple bacterial morphotypes present, none predominant. Suggest appropriate recollection if clinically indicated. Performed at [facility]    Report Status 09/09/2014 FINAL  Final  Culture, blood (routine x 2)     Status: None (Preliminary result)   Collection Time: 09/10/14  1:40 PM  Result Value Ref Range Status   Specimen Description PORTA CATH  Final   Special Requests BOTTLES DRAWN AEROBIC AND ANAEROBIC 10CC  Final   Culture NO GROWTH 3 DAYS  Final   Report Status PENDING  Incomplete  Culture, blood (routine x 2)     Status: None (Preliminary result)   Collection Time: 09/10/14  2:30 PM  Result Value Ref Range Status   Specimen Description PORTA CATH  Final   Special Requests BOTTLES DRAWN AEROBIC AND ANAEROBIC 10CC EACH  Final   Culture NO GROWTH 3 DAYS  Final   Report Status PENDING  Incomplete    Coagulation Studies: No results for input(s): LABPROT, INR in the last 72 hours.  Urinalysis: No results for input(s): COLORURINE, LABSPEC, PHURINE, GLUCOSEU, HGBUR,  BILIRUBINUR, KETONESUR, PROTEINUR, UROBILINOGEN, NITRITE, LEUKOCYTESUR in the last 72 hours.  Invalid input(s): APPERANCEUR    Imaging: Ir Removal Tun Cv Cath W/o Fl  09/12/2014   CLINICAL DATA:  Patient with history of end-stage renal disease, HIV, MRSA bacteremia and previously placed right internal jugular tunneled hemodialysis catheter at outside facility. Request is now made for hemodialysis catheter removal  EXAM: REMOVAL TUNNELED CENTRAL VENOUS CATHETER  PROCEDURE: The  patient's right chest and catheter was prepped and draped in a normal sterile fashion. Heparin was removed from both ports of catheter. 1% lidocaine was used for local anesthesia. Using gentle blunt dissection the cuff of the catheter was exposed and the catheter was removed in it's entirety. Pressure was held till hemostasis was obtained. A sterile dressing was applied. The patient tolerated the procedure well with no immediate complications.  COMPLICATIONS: None immediate.  IMPRESSION: Successful catheter removal as described above.  Read by: Jeananne Rama, PA-C   Electronically Signed   By: Oley Balm M.D.   On: 09/12/2014 13:51     Medications:     . emtricitabine  200 mg Oral Once per day on Mon Fri  . epoetin alfa  10,000 Units Intravenous Q T,Th,Sa-HD  . etravirine  200 mg Oral BID WC  . heparin subcutaneous  5,000 Units Subcutaneous 3 times per day  . raltegravir  400 mg Oral BID  . [START ON 09/14/2014] tenofovir  300 mg Oral Q Mon  . vancomycin  750 mg Intravenous Q T,Th,Sa-HD   sodium chloride, sodium chloride, acetaminophen **OR** acetaminophen, feeding supplement (NEPRO CARB STEADY), heparin, heparin, lidocaine (PF), lidocaine-prilocaine, ondansetron **OR** ondansetron (ZOFRAN) IV, pentafluoroprop-tetrafluoroeth, sodium chloride, sodium chloride  Assessment/ Plan:   ESRD- TTS [facility]  ANEMIA- Hb 7.8 ESA treatment would hold on IV iron in setting of bacteremia  MBD- Continue  binders and calcitriol  HTN/VOL-Well controlled  ACCESS- I J catheter removed on 11/28     LOS: 5 Xoe Hoe W @TODAY @8 :51 AM

## 2014-09-13 NOTE — Progress Notes (Signed)
PROGRESS NOTE  Dave Estrada WUJ:[id number] DOB: 1991/09/28 DOA: 09/08/2014 PCP: No primary care provider on file.   Brief history 23 year old male with a history of ESRD, HIV presented with fevers and chills. He was found to have MRSA bacteremia. The patient's antibiotics were narrowed to vancomycin. Infectious disease followed the patient. Given the complexity of the situation, Nephrology and Cardiology have both recommended transfer to [facility] to complete work up. The patient was transferred to [facility] on 09/11/2014. TEE is scheduled 09/14/2014. His tunnel dialysis catheter will be removed after his HD on 09/12/2014. 09/10/2014 surveillance blood cultures remain negative.  Assessment/Plan: Sepsis -Present at the time of admission -Secondary to bacteremia -Improved with antibiotics MRSA bacteremia -Dialysis catheter is the likely source -09/10/2014 Surveillance blood cultures remain negative -HD catheter removed 09/12/14 after pt had HD -Place new dialysis catheter if surveillance blood cultures remain negative -Continue intravenous vancomycin -TEE scheduled 09/14/2014 ESRD -Dialysis Tuesday, Thursday, Saturday -Appreciate nephrology follow-up AIDS -Continue antiretroviral therapy -Last CD4 count 179 on 06/17/2014 -Last HIV RNA undetectable--06/17/2014 -Restart Bactrim prophylaxis x 1 today--will dose next dose on HD day after HD Liver lesion on 2d echo -LFT's are unremarkable -Follow up RUQ US demonstrating normal liver, no lesions seen   Family Communication: family updated at bedside 09/12/14 Disposition Plan: Home when medically stable   Procedures/Studies: Dg Chest 2 View  09/08/2014   CLINICAL DATA:  Fever for 2 days with cough  EXAM: CHEST  2 VIEW  COMPARISON:  None.  FINDINGS: Cardiac shadow is within normal limits. A right jugular dialysis catheter is seen in satisfactory position. The lungs are clear bilaterally. No effusion is noted. No acute  bony abnormality is seen.  IMPRESSION: No acute abnormality noted.   Electronically Signed   By: Alcide Clever M.D.   On: 09/08/2014 17:13   Ct Head Wo Contrast  09/08/2014   CLINICAL DATA:  Headaches and fevers for 2 days  EXAM: CT HEAD WITHOUT CONTRAST  TECHNIQUE: Contiguous axial images were obtained from the base of the skull through the vertex without intravenous contrast.  COMPARISON:  None.  FINDINGS: Bony calvarium is intact. The paranasal sinuses show partial opacification in the maxillary antra as well as some thickening of the nasal passages on the left. May be related to acute sinusitis. No findings to suggest acute hemorrhage, acute infarction or space-occupying mass lesion are noted.  IMPRESSION: Changes in the maxillary antra and left sinus passages likely related to a degree of sinusitis. No other focal abnormality is noted.   Electronically Signed   By: Alcide Clever M.D.   On: 09/08/2014 18:16   US Abdomen Limited  09/10/2014   CLINICAL DATA:  Indeterminate liver lesion identified on echocardiogram.  EXAM: US ABDOMEN LIMITED - RIGHT UPPER QUADRANT  COMPARISON:  None.  FINDINGS: Common bile duct:  Diameter: Normal diameter.  Liver:  No focal liver lesion identified.  No ductal dilatation.  IMPRESSION: No focal liver lesion identified.   Electronically Signed   By: Genevive Bi M.D.   On: 09/10/2014 16:33   Ir Removal Tun Cv Cath W/o Fl  09/12/2014   CLINICAL DATA:  Patient with history of end-stage renal disease, HIV, MRSA bacteremia and previously placed right internal jugular tunneled hemodialysis catheter at outside facility. Request is now made for hemodialysis catheter removal  EXAM: REMOVAL TUNNELED CENTRAL VENOUS CATHETER  PROCEDURE: The patient's right chest and catheter was prepped and draped in a normal  sterile fashion. Heparin was removed from both ports of catheter. 1% lidocaine was used for local anesthesia. Using gentle blunt dissection the cuff of the catheter was  exposed and the catheter was removed in it's entirety. Pressure was held till hemostasis was obtained. A sterile dressing was applied. The patient tolerated the procedure well with no immediate complications.  COMPLICATIONS: None immediate.  IMPRESSION: Successful catheter removal as described above.  Read by: Jeananne Rama, PA-C   Electronically Signed   By: Oley Balm M.D.   On: 09/12/2014 13:51         Subjective: Patient denies fevers, chills, headache, chest pain, dyspnea, nausea, vomiting, diarrhea, abdominal pain, dysuria, hematuria   Objective: Filed Vitals:   09/12/14 2037 09/12/14 2059 09/13/14 0608 09/13/14 0900  BP: 116/73  99/52 129/65  Pulse: 106  91 94  Temp: 98.3 F (36.8 C)  98.3 F (36.8 C) 98.2 F (36.8 C)  TempSrc:    Oral  Resp: 19  17 18   Height:      Weight:  62.596 kg (138 lb)    SpO2: 100%  96% 96%    Intake/Output Summary (Last 24 hours) at 09/13/14 1408 Last data filed at 09/13/14 0900  Gross per 24 hour  Intake    840 ml  Output      0 ml  Net    840 ml   Weight change: 2.202 kg (4 lb 13.7 oz) Exam:   General:  Pt is alert, follows commands appropriately, not in acute distress  HEENT: No icterus, No thrush,  [place]/AT  Cardiovascular: RRR, S1/S2, no rubs, no gallops  Respiratory: CTA bilaterally, no wheezing, no crackles, no rhonchi  Abdomen: Soft/+BS, non tender, non distended, no guarding  Extremities: No edema, No lymphangitis, No petechiae, No rashes, no synovitis  Data Reviewed: Basic Metabolic Panel:  Recent Labs Lab 09/08/14 1553 09/09/14 0626 09/10/14 0635 09/11/14 0649 09/12/14 0822  NA 135* 138 136* 138 139  K 3.7 4.0 4.2 4.0 4.2  CL 93* 99 101 101 105  CO2 26 25 22 25 22   GLUCOSE 123* 97 102* 105* 126*  BUN 30* 35* 36* 19 19  CREATININE 7.68* 8.41* 8.54* 6.06* 5.90*  CALCIUM 8.5 7.8* 7.9* 8.1* 8.5  PHOS  --   --   --  2.5 2.2*   Liver Function Tests:  Recent Labs Lab 09/08/14 1553 09/09/14 0626  09/12/14 0822  AST 20 19  --   ALT 9 11  --   ALKPHOS 106 99  --   BILITOT 1.0 0.9  --   PROT 8.3 7.5  --   ALBUMIN 3.6 3.2* 2.8*   No results for input(s): LIPASE, AMYLASE in the last 168 hours. No results for input(s): AMMONIA in the last 168 hours. CBC:  Recent Labs Lab 09/08/14 1553 09/09/14 0626 09/10/14 0635 09/11/14 0649 09/12/14 0823  WBC 8.3 6.0 7.5 7.1 6.4  NEUTROABS 7.2  --   --   --   --   HGB 9.9* 9.4* 7.6* 8.1* 7.8*  HCT 28.7* 27.6* 22.5* 24.4* 23.5*  MCV 102.1* 102.6* 103.7* 102.5* 100.9*  PLT 129* 126* 146* 192 260   Cardiac Enzymes: No results for input(s): CKTOTAL, CKMB, CKMBINDEX, TROPONINI in the last 168 hours. BNP: Invalid input(s): POCBNP CBG: No results for input(s): GLUCAP in the last 168 hours.  Recent Results (from the past 240 hour(s))  Blood culture (routine x 2)     Status: None   Collection Time: 09/08/14  4:30 PM  Result Value Ref Range Status   Specimen Description BLOOD RIGHT HAND  Final   Special Requests   Final    BOTTLES DRAWN AEROBIC AND ANAEROBIC AEB=10CC ANA=8CC   Culture  Setup Time   Final    09/09/2014 14:51 Performed at [facility]    Culture   Final    STAPHYLOCOCCUS AUREUS Note: SUSCEPTIBILITIES PERFORMED ON PREVIOUS CULTURE WITHIN THE LAST 5 DAYS. Note: Gram Stain Report Called to,Read Back By and Verified With: Loralie ChampagneMCKINNEY S [phone] BY HUFFINES 09/09/14 Performed at [facility] Performed at [facility]    Report Status 09/11/2014 FINAL  Final  Blood culture (routine x 2)     Status: None   Collection Time: 09/08/14  4:38 PM  Result Value Ref Range Status   Specimen Description BLOOD LEFT HAND  Final   Special Requests   Final    BOTTLES DRAWN AEROBIC AND ANAEROBIC AEB=14CC ANA=10CC   Culture  Setup Time   Final    09/09/2014 14:46 Performed at [facility]    Culture   Final    METHICILLIN RESISTANT STAPHYLOCOCCUS AUREUS Note: RIFAMPIN AND GENTAMICIN SHOULD NOT BE USED AS  SINGLE DRUGS FOR TREATMENT OF STAPH INFECTIONS. CRITICAL RESULT CALLED TO, READ BACK BY AND VERIFIED WITH: [name] 09/10/14 1510 BY [name] This organism DOES NOT demonstrate inducible  Clindamycin resistance in vitro. Note: Gram Stain Report Called to,Read Back By and Verified With: [facility] AT 0727 BY HUFFINES S 09/09/14 Performed at [facility] Performed at [facility]    Report Status 09/11/2014 FINAL  Final   Organism ID, Bacteria METHICILLIN RESISTANT STAPHYLOCOCCUS AUREUS  Final      Susceptibility   Methicillin resistant staphylococcus aureus - MIC*    CLINDAMYCIN <=0.25 SENSITIVE Sensitive     ERYTHROMYCIN >=8 RESISTANT Resistant     GENTAMICIN <=0.5 SENSITIVE Sensitive     LEVOFLOXACIN 4 INTERMEDIATE Intermediate     OXACILLIN >=4 RESISTANT Resistant     PENICILLIN >=0.5 RESISTANT Resistant     RIFAMPIN <=0.5 SENSITIVE Sensitive     TRIMETH/SULFA <=10 SENSITIVE Sensitive     VANCOMYCIN <=0.5 SENSITIVE Sensitive     TETRACYCLINE <=1 SENSITIVE Sensitive     * METHICILLIN RESISTANT STAPHYLOCOCCUS AUREUS  Urine culture     Status: None   Collection Time: 09/08/14  5:25 PM  Result Value Ref Range Status   Specimen Description URINE, CLEAN CATCH  Final   Special Requests NONE  Final   Culture  Setup Time   Final    09/08/2014 23:18 Performed at [facility]    Colony Count   Final    30,000 COLONIES/ML Performed at [facility]    Culture   Final    Multiple bacterial morphotypes present, none predominant. Suggest appropriate recollection if clinically indicated. Performed at [facility]    Report Status 09/09/2014 FINAL  Final  Culture, blood (routine x 2)     Status: None (Preliminary result)   Collection Time: 09/10/14  1:40 PM  Result Value Ref Range Status   Specimen Description PORTA CATH  Final   Special Requests BOTTLES DRAWN AEROBIC AND ANAEROBIC 10CC  Final   Culture NO GROWTH 3 DAYS  Final   Report  Status PENDING  Incomplete  Culture, blood (routine x 2)     Status: None (Preliminary result)   Collection Time: 09/10/14  2:30 PM  Result Value Ref Range Status   Specimen  Description PORTA CATH  Final   Special Requests BOTTLES DRAWN AEROBIC AND ANAEROBIC 10CC EACH  Final   Culture NO GROWTH 3 DAYS  Final   Report Status PENDING  Incomplete     Scheduled Meds: . emtricitabine  200 mg Oral Once per day on Mon Fri  . epoetin alfa  10,000 Units Intravenous Q T,Th,Sa-HD  . etravirine  200 mg Oral BID WC  . heparin subcutaneous  5,000 Units Subcutaneous 3 times per day  . raltegravir  400 mg Oral BID  . [START ON 09/14/2014] tenofovir  300 mg Oral Q Mon  . vancomycin  750 mg Intravenous Q T,Th,Sa-HD   Continuous Infusions:    Diamond Jentz, DO  [facility] Pager [phone]  If 7PM-7AM, please contact night-coverage [email or web address] Password [email or web address] 09/13/2014, 2:08 PM   LOS: 5 days

## 2014-09-13 NOTE — Plan of Care (Signed)
Problem: Phase II Progression Outcomes Goal: Other Phase II Outcomes/Goals Outcome: Completed/Met Date Met:  09/13/14  Problem: Phase III Progression Outcomes Goal: Pain controlled on oral analgesia Outcome: Completed/Met Date Met:  09/13/14 Goal: Voiding independently Outcome: Completed/Met Date Met:  09/13/14 Goal: Foley discontinued Outcome: Completed/Met Date Met:  09/13/14

## 2014-09-13 NOTE — Progress Notes (Signed)
ANTIBIOTIC CONSULT NOTE - FOLLOW UP  Pharmacy Consult for vancomycin Indication: MRSA bacteremia  Allergies  Allergen Reactions  . Aspirin Other (See Comments)    Reaction is unknown    Patient Measurements: Height: 5\' 5"  (165.1 cm) Weight: 138 lb (62.596 kg) IBW/kg (Calculated) : 61.5  Vital Signs: Temp: 98.2 F (36.8 C) (11/29 0900) Temp Source: Oral (11/29 0900) BP: 129/65 mmHg (11/29 0900) Pulse Rate: 94 (11/29 0900) Intake/Output from previous day: 11/28 0701 - 11/29 0700 In: 840 [P.O.:720; I.V.:120] Out: 1842  Intake/Output from this shift: Total I/O In: 360 [P.O.:360] Out: 0   Labs:  Recent Labs  09/11/14 0649 09/12/14 0822 09/12/14 0823  WBC 7.1  --  6.4  HGB 8.1*  --  7.8*  PLT 192  --  260  CREATININE 6.06* 5.90*  --    Estimated Creatinine Clearance: 16.6 mL/min (by C-G formula based on Cr of 5.9). No results for input(s): VANCOTROUGH, VANCOPEAK, VANCORANDOM, GENTTROUGH, GENTPEAK, GENTRANDOM, TOBRATROUGH, TOBRAPEAK, TOBRARND, AMIKACINPEAK, AMIKACINTROU, AMIKACIN in the last 72 hours.   Microbiology: Recent Results (from the past 720 hour(s))  Blood culture (routine x 2)     Status: None   Collection Time: 09/08/14  4:30 PM  Result Value Ref Range Status   Specimen Description BLOOD RIGHT HAND  Final   Special Requests   Final    BOTTLES DRAWN AEROBIC AND ANAEROBIC AEB=10CC ANA=8CC   Culture  Setup Time   Final    09/09/2014 14:51 Performed at [facility]    Culture   Final    STAPHYLOCOCCUS AUREUS Note: SUSCEPTIBILITIES PERFORMED ON PREVIOUS CULTURE WITHIN THE LAST 5 DAYS. Note: Gram Stain Report Called to,Read Back By and Verified With: [name] [phone] BY HUFFINES 09/09/14 Performed at [facility] Performed at [facility]    Report Status 09/11/2014 FINAL  Final  Blood culture (routine x 2)     Status: None   Collection Time: 09/08/14  4:38 PM  Result Value Ref Range Status   Specimen Description BLOOD LEFT HAND   Final   Special Requests   Final    BOTTLES DRAWN AEROBIC AND ANAEROBIC AEB=14CC ANA=10CC   Culture  Setup Time   Final    09/09/2014 14:46 Performed at [facility]    Culture   Final    METHICILLIN RESISTANT STAPHYLOCOCCUS AUREUS Note: RIFAMPIN AND GENTAMICIN SHOULD NOT BE USED AS SINGLE DRUGS FOR TREATMENT OF STAPH INFECTIONS. CRITICAL RESULT CALLED TO, READ BACK BY AND VERIFIED WITH: [name] 09/10/14 1510 BY [name] This organism DOES NOT demonstrate inducible  Clindamycin resistance in vitro. Note: Gram Stain Report Called to,Read Back By and Verified With: [facility] S AT 0727 BY HUFFINES S 09/09/14 Performed at [facility] Performed at [facility]    Report Status 09/11/2014 FINAL  Final   Organism ID, Bacteria METHICILLIN RESISTANT STAPHYLOCOCCUS AUREUS  Final      Susceptibility   Methicillin resistant staphylococcus aureus - MIC*    CLINDAMYCIN <=0.25 SENSITIVE Sensitive     ERYTHROMYCIN >=8 RESISTANT Resistant     GENTAMICIN <=0.5 SENSITIVE Sensitive     LEVOFLOXACIN 4 INTERMEDIATE Intermediate     OXACILLIN >=4 RESISTANT Resistant     PENICILLIN >=0.5 RESISTANT Resistant     RIFAMPIN <=0.5 SENSITIVE Sensitive     TRIMETH/SULFA <=10 SENSITIVE Sensitive     VANCOMYCIN <=0.5 SENSITIVE Sensitive     TETRACYCLINE <=1 SENSITIVE Sensitive     * METHICILLIN RESISTANT STAPHYLOCOCCUS AUREUS  Urine culture     Status: None   Collection Time: 09/08/14  5:25 PM  Result Value Ref Range Status   Specimen Description URINE, CLEAN CATCH  Final   Special Requests NONE  Final   Culture  Setup Time   Final    09/08/2014 23:18 Performed at [facility]   Final    30,000 COLONIES/ML Performed at [facility]    Culture   Final    Multiple bacterial morphotypes present, none predominant. Suggest appropriate recollection if clinically indicated. Performed at [facility]    Report Status 09/09/2014 FINAL   Final  Culture, blood (routine x 2)     Status: None (Preliminary result)   Collection Time: 09/10/14  1:40 PM  Result Value Ref Range Status   Specimen Description PORTA CATH  Final   Special Requests BOTTLES DRAWN AEROBIC AND ANAEROBIC 10CC  Final   Culture NO GROWTH 3 DAYS  Final   Report Status PENDING  Incomplete  Culture, blood (routine x 2)     Status: None (Preliminary result)   Collection Time: 09/10/14  2:30 PM  Result Value Ref Range Status   Specimen Description PORTA CATH  Final   Special Requests BOTTLES DRAWN AEROBIC AND ANAEROBIC 10CC EACH  Final   Culture NO GROWTH 3 DAYS  Final   Report Status PENDING  Incomplete    Anti-infectives    Start     Dose/Rate Route Frequency Ordered Stop   09/14/14 1200  tenofovir (VIREAD) tablet 300 mg     300 mg Oral Every Mon 09/08/14 2148     09/13/14 0800  etravirine (INTELENCE) tablet 200 mg     200 mg Oral 2 times daily with meals 09/12/14 1439     09/12/14 1930  etravirine (INTELENCE) tablet 200 mg     200 mg Oral  Once 09/12/14 1440 09/12/14 2010   09/12/14 1200  ceFAZolin (ANCEF) IVPB 2 g/50 mL premix  Status:  Discontinued     2 g100 mL/hr over 30 Minutes Intravenous Every T-Th-Sa (Hemodialysis) 09/10/14 1347 09/12/14 1435   09/11/14 1200  emtricitabine (EMTRIVA) capsule 200 mg     200 mg Oral Once per day on Mon Fri 09/08/14 2148     09/10/14 1500  ceFAZolin (ANCEF) IVPB 2 g/50 mL premix  Status:  Discontinued     2 g100 mL/hr over 30 Minutes Intravenous Every 8 hours 09/10/14 1328 09/10/14 1337   09/10/14 1345  ceFAZolin (ANCEF) IVPB 2 g/50 mL premix     2 g100 mL/hr over 30 Minutes Intravenous  Once 09/10/14 1338 09/10/14 1707   09/10/14 1200  vancomycin (VANCOCIN) IVPB 750 mg/150 ml premix     750 mg150 mL/hr over 60 Minutes Intravenous Every T-Th-Sa (Hemodialysis) 09/09/14 1601     09/09/14 0400  piperacillin-tazobactam (ZOSYN) IVPB 2.25 g  Status:  Discontinued     2.25 g100 mL/hr over 30 Minutes Intravenous Every 8  hours 09/08/14 2147 09/10/14 1312   09/08/14 2200  etravirine (INTELENCE) tablet 200 mg  Status:  Discontinued     200 mg Oral Every 12 hours 09/08/14 2148 09/12/14 1439   09/08/14 2200  raltegravir (ISENTRESS) tablet 400 mg     400 mg Oral 2 times daily 09/08/14 2148     09/08/14 1915  vancomycin (VANCOCIN) IVPB 1000 mg/200 mL premix     1,000 mg200 mL/hr over 60 Minutes Intravenous  Once 09/08/14 1911 09/08/14 2154  09/08/14 1915  piperacillin-tazobactam (ZOSYN) IVPB 3.375 g     3.375 g100 mL/hr over 30 Minutes Intravenous  Once 09/08/14 1911 09/08/14 1951      Assessment: 23 y/o male with ESRD on HD TTS who presented to [facility] on 11/24 with fever for 2 days found to have recurrent MRSA bacteremia from suspected HD cath line which was removed on 11/28. He remains on vancomycin and today is day 6 of therapy. He is afebrile, WBC are normal , and repeat cultures are negative thus far.  Goal of Therapy:  pre HD vancomycin level 15-25 mcg/ml  Plan:  - Continue vancomycin 750 mg IV after HD on TTS - Pre HD level prior to next HD session on Tues - Monitor clinical progress and culture data  East Mountain HospitalJennifer Pittston, Lake St. LouisPharm.D., BCPS Clinical Pharmacist Pager: [phone] 09/13/2014 2:07 PM

## 2014-09-14 ENCOUNTER — Encounter (HOSPITAL_COMMUNITY): Payer: Self-pay | Admitting: *Deleted

## 2014-09-14 ENCOUNTER — Encounter (HOSPITAL_COMMUNITY)
Admission: EM | Disposition: A | Discharge status: Home / Self Care | Payer: Self-pay | Source: Home / Self Care | Attending: Internal Medicine

## 2014-09-14 DIAGNOSIS — N185 Chronic kidney disease, stage 5: Secondary | ICD-10-CM

## 2014-09-14 DIAGNOSIS — I340 Nonrheumatic mitral (valve) insufficiency: Secondary | ICD-10-CM

## 2014-09-14 HISTORY — PX: TEE WITHOUT CARDIOVERSION: SHX5443

## 2014-09-14 LAB — RENAL FUNCTION PANEL
Albumin: 2.9 g/dL — ABNORMAL LOW (ref 3.5–5.2)
Anion gap: 16 — ABNORMAL HIGH (ref 5–15)
BUN: 18 mg/dL (ref 6–23)
CO2: 21 meq/L (ref 19–32)
CREATININE: 6.3 mg/dL — AB (ref 0.50–1.35)
Calcium: 8.6 mg/dL (ref 8.4–10.5)
Chloride: 102 mEq/L (ref 96–112)
GFR calc non Af Amer: 11 mL/min — ABNORMAL LOW (ref >90)
GFR, EST AFRICAN AMERICAN: 13 mL/min — AB (ref >90)
GLUCOSE: 91 mg/dL (ref 70–99)
Phosphorus: 2.9 mg/dL (ref 2.3–4.6)
Potassium: 4.5 mEq/L (ref 3.7–5.3)
SODIUM: 139 meq/L (ref 137–147)

## 2014-09-14 SURGERY — ECHOCARDIOGRAM, TRANSESOPHAGEAL
Anesthesia: Moderate Sedation

## 2014-09-14 MED ORDER — RALTEGRAVIR POTASSIUM 400 MG PO TABS
400.0000 mg | ORAL_TABLET | Freq: Two times a day (BID) | ORAL | Status: DC
  Administered 2014-09-14: 400 mg via ORAL
  Filled 2014-09-14 (×3): qty 1

## 2014-09-14 MED ORDER — FENTANYL CITRATE 0.05 MG/ML IJ SOLN
INTRAMUSCULAR | Status: AC
  Filled 2014-09-14: qty 2

## 2014-09-14 MED ORDER — TENOFOVIR DISOPROXIL FUMARATE 300 MG PO TABS
300.0000 mg | ORAL_TABLET | ORAL | Status: DC
  Filled 2014-09-14: qty 1

## 2014-09-14 MED ORDER — ETRAVIRINE 100 MG PO TABS
200.0000 mg | ORAL_TABLET | Freq: Two times a day (BID) | ORAL | Status: DC
  Administered 2014-09-14: 200 mg via ORAL
  Filled 2014-09-14 (×5): qty 2

## 2014-09-14 MED ORDER — MIDAZOLAM HCL 10 MG/2ML IJ SOLN
INTRAMUSCULAR | Status: DC | PRN
  Administered 2014-09-14 (×2): 2 mg via INTRAVENOUS
  Administered 2014-09-14: 1 mg via INTRAVENOUS

## 2014-09-14 MED ORDER — EMTRICITABINE 200 MG PO CAPS
200.0000 mg | ORAL_CAPSULE | ORAL | Status: DC
  Filled 2014-09-14: qty 1

## 2014-09-14 MED ORDER — MIDAZOLAM HCL 5 MG/ML IJ SOLN
INTRAMUSCULAR | Status: AC
Start: 2014-09-14 — End: 2014-09-14
  Filled 2014-09-14: qty 2

## 2014-09-14 MED ORDER — BUTAMBEN-TETRACAINE-BENZOCAINE 2-2-14 % EX AERO
INHALATION_SPRAY | CUTANEOUS | Status: DC | PRN
  Administered 2014-09-14: 2 via TOPICAL

## 2014-09-14 MED ORDER — DIPHENHYDRAMINE HCL 50 MG/ML IJ SOLN
INTRAMUSCULAR | Status: DC | PRN
  Administered 2014-09-14: 25 mg via INTRAVENOUS

## 2014-09-14 MED ORDER — FENTANYL CITRATE 0.05 MG/ML IJ SOLN
INTRAMUSCULAR | Status: DC | PRN
  Administered 2014-09-14 (×3): 25 ug via INTRAVENOUS

## 2014-09-14 MED ORDER — SODIUM CHLORIDE 0.9 % IV SOLN
INTRAVENOUS | Status: DC
  Administered 2014-09-14: 500 mL via INTRAVENOUS

## 2014-09-14 MED ORDER — DIPHENHYDRAMINE HCL 50 MG/ML IJ SOLN
INTRAMUSCULAR | Status: AC
  Filled 2014-09-14: qty 1

## 2014-09-14 NOTE — Progress Notes (Signed)
S: no new CO.  Has court date on thurs and says he has to be out of hosp by then O:BP 108/61 mmHg  Pulse 89  Temp(Src) 98.2 F (36.8 C) (Oral)  Resp 19  Ht 5\' 5"  (1.651 m)  Wt 61.236 kg (135 lb)  BMI 22.47 kg/m2  SpO2 99%  Intake/Output Summary (Last 24 hours) at 09/14/14 0945 Last data filed at 09/14/14 0700  Gross per 24 hour  Intake    960 ml  Output      0 ml  Net    960 ml   Weight change: -3.064 kg (-6 lb 12.1 oz) XYI:AXKPV and alert CVS:RRR Resp:clear Abd:+ BS NTND Ext:no edema NEURO:CNI Ox3 no asterixis   . emtricitabine  200 mg Oral Once per day on Mon Fri  . epoetin alfa  10,000 Units Intravenous Q T,Th,Sa-HD  . etravirine  200 mg Oral BID WC  . heparin subcutaneous  5,000 Units Subcutaneous 3 times per day  . raltegravir  400 mg Oral BID  . tenofovir  300 mg Oral Q Mon  . vancomycin  750 mg Intravenous Q T,Th,Sa-HD   Ir Removal Tun Cv Cath W/o Fl  09/12/2014   CLINICAL DATA:  Patient with history of end-stage renal disease, HIV, MRSA bacteremia and previously placed right internal jugular tunneled hemodialysis catheter at outside facility. Request is now made for hemodialysis catheter removal  EXAM: REMOVAL TUNNELED CENTRAL VENOUS CATHETER  PROCEDURE: The patient's right chest and catheter was prepped and draped in a normal sterile fashion. Heparin was removed from both ports of catheter. 1% lidocaine was used for local anesthesia. Using gentle blunt dissection the cuff of the catheter was exposed and the catheter was removed in it's entirety. Pressure was held till hemostasis was obtained. A sterile dressing was applied. The patient tolerated the procedure well with no immediate complications.  COMPLICATIONS: None immediate.  IMPRESSION: Successful catheter removal as described above.  Read by: Jeananne Rama, PA-C   Electronically Signed   By: Oley Balm M.D.   On: 09/12/2014 13:51   BMET    Component Value Date/Time   NA 139 09/12/2014 0822   K 4.2  09/12/2014 0822   CL 105 09/12/2014 0822   CO2 22 09/12/2014 0822   GLUCOSE 126* 09/12/2014 0822   BUN 19 09/12/2014 0822   CREATININE 5.90* 09/12/2014 0822   CALCIUM 8.5 09/12/2014 0822   GFRNONAA 12* 09/12/2014 0822   GFRAA 14* 09/12/2014 0822   CBC    Component Value Date/Time   WBC 6.4 09/12/2014 0823   RBC 2.33* 09/12/2014 0823   HGB 7.8* 09/12/2014 0823   HCT 23.5* 09/12/2014 0823   PLT 260 09/12/2014 0823   MCV 100.9* 09/12/2014 0823   MCH 33.5 09/12/2014 0823   MCHC 33.2 09/12/2014 0823   RDW 14.4 09/12/2014 0823   LYMPHSABS 0.6* 09/08/2014 1553   MONOABS 0.5 09/08/2014 1553   EOSABS 0.0 09/08/2014 1553   BASOSABS 0.0 09/08/2014 1553     Assessment: 1. MRSA bacteremia secondary to HD cath infection 2. HIV 3. ESRD  Followed at [facility] HD MWF Plan: 1. For TTE today 2. Plan Permcath in AM, [facility] called 3.  HD in AM after cath   Charlise Giovanetti T

## 2014-09-14 NOTE — Progress Notes (Signed)
Patient Name: Dave Estrada Date of Encounter: 09/14/2014     Principal Problem:   MRSA bacteremia Active Problems:   HIV (human immunodeficiency virus infection)   ESRD on hemodialysis   Staphylococcus aureus bacteremia with sepsis   Macrocytic anemia   Cigarette smoker   Liver lesion   Asthma, chronic   History of MRSA infection   AIDS   End stage renal disease    SUBJECTIVE  Denies any discomfort, no CP or SOB. Pending TEE today.   CURRENT MEDS . emtricitabine  200 mg Oral Once per day on Mon Fri  . epoetin alfa  10,000 Units Intravenous Q T,Th,Sa-HD  . etravirine  200 mg Oral BID WC  . heparin subcutaneous  5,000 Units Subcutaneous 3 times per day  . raltegravir  400 mg Oral BID  . tenofovir  300 mg Oral Q Mon  . vancomycin  750 mg Intravenous Q T,Th,Sa-HD    OBJECTIVE  Filed Vitals:   09/13/14 1741 09/13/14 2003 09/14/14 0526 09/14/14 1000  BP: 135/75 126/73 108/61 122/78  Pulse: 95 88 89 94  Temp: 97 F (36.1 C) 98 F (36.7 C) 98.2 F (36.8 C) 98.1 F (36.7 C)  TempSrc: Oral   Oral  Resp: 18 18 19 20   Height:      Weight:  135 lb (61.236 kg)    SpO2: 96% 100% 99% 100%    Intake/Output Summary (Last 24 hours) at 09/14/14 1024 Last data filed at 09/14/14 0900  Gross per 24 hour  Intake    960 ml  Output      0 ml  Net    960 ml   Filed Weights   09/12/14 1114 09/12/14 2059 09/13/14 2003  Weight: 136 lb 11 oz (62 kg) 138 lb (62.596 kg) 135 lb (61.236 kg)    PHYSICAL EXAM  General: Pleasant, NAD. Neuro: Alert and oriented X 3. Moves all extremities spontaneously. Psych: Normal affect. HEENT:  Normal  Neck: Supple without bruits or JVD. Lungs:  Resp regular and unlabored, CTA. Heart: RRR no s3, s4, or murmurs. Abdomen: Soft, non-tender, non-distended, BS + x 4.  Extremities: No clubbing, cyanosis or edema. DP/PT/Radials 2+ and equal bilaterally.  Accessory Clinical Findings  CBC  Recent Labs  09/12/14 0823  WBC 6.4  HGB 7.8*   HCT 23.5*  MCV 100.9*  PLT 260   Basic Metabolic Panel  Recent Labs  09/12/14 0822  NA 139  K 4.2  CL 105  CO2 22  GLUCOSE 126*  BUN 19  CREATININE 5.90*  CALCIUM 8.5  PHOS 2.2*   Liver Function Tests  Recent Labs  09/12/14 0822  ALBUMIN 2.8*    TELE Not on telemetry     Echo 09/09/2014 LV EF: 45% -  50%  ------------------------------------------------------------------- Indications:   Fever 780.6.  ------------------------------------------------------------------- History:  Functional status:  Dialysis-dependent renal failure. Risk factors: HIV, Marijuana use. Current tobacco use.  ------------------------------------------------------------------- Study Conclusions  - Left ventricle: The cavity size was normal. Wall thickness was normal. Systolic function was mildly reduced. The estimated ejection fraction was in the range of 45% to 50%. Left ventricular diastolic function parameters were normal. - Aortic valve: Mildly calcified annulus. Trileaflet; mildly thickened leaflets. - Mitral valve: Mildly calcified annulus. Mildly thickened leaflets . - Left atrium: The atrium was mildly dilated. - Right atrium: The catheter tip is not visualized well enough to exclude possible vegetation given known bacteremia. - Pericardium, extracardiac: There is a 1.8 x 1.0 cm  echogenic atypical structure noted in the liver. Consider focused liver US study. - Impressions: No evidence of valvular vegetation. Cannot exlcude possible RA catheter vegetation. - Technically adequate study.  Impressions:  - No evidence of valvular vegetation. Cannot exlcude possible RA catheter vegetation.     Radiology/Studies  Dg Chest 2 View  09/08/2014   CLINICAL DATA:  Fever for 2 days with cough  EXAM: CHEST  2 VIEW  COMPARISON:  None.  FINDINGS: Cardiac shadow is within normal limits. A right jugular dialysis catheter is seen in satisfactory  position. The lungs are clear bilaterally. No effusion is noted. No acute bony abnormality is seen.  IMPRESSION: No acute abnormality noted.   Electronically Signed   By: Alcide CleverMark  Lukens M.D.   On: 09/08/2014 17:13   Ct Head Wo Contrast  09/08/2014   CLINICAL DATA:  Headaches and fevers for 2 days  EXAM: CT HEAD WITHOUT CONTRAST  TECHNIQUE: Contiguous axial images were obtained from the base of the skull through the vertex without intravenous contrast.  COMPARISON:  None.  FINDINGS: Bony calvarium is intact. The paranasal sinuses show partial opacification in the maxillary antra as well as some thickening of the nasal passages on the left. May be related to acute sinusitis. No findings to suggest acute hemorrhage, acute infarction or space-occupying mass lesion are noted.  IMPRESSION: Changes in the maxillary antra and left sinus passages likely related to a degree of sinusitis. No other focal abnormality is noted.   Electronically Signed   By: Alcide CleverMark  Lukens M.D.   On: 09/08/2014 18:16   [place] Abdomen Limited  09/10/2014   CLINICAL DATA:  Indeterminate liver lesion identified on echocardiogram.  EXAM: US ABDOMEN LIMITED - RIGHT UPPER QUADRANT  COMPARISON:  None.  FINDINGS: Common bile duct:  Diameter: Normal diameter.  Liver:  No focal liver lesion identified.  No ductal dilatation.  IMPRESSION: No focal liver lesion identified.   Electronically Signed   By: Genevive BiStewart  Edmunds M.D.   On: 09/10/2014 16:33   Ir Removal Tun Cv Cath W/o Fl  09/12/2014   CLINICAL DATA:  Patient with history of end-stage renal disease, HIV, MRSA bacteremia and previously placed right internal jugular tunneled hemodialysis catheter at outside facility. Request is now made for hemodialysis catheter removal  EXAM: REMOVAL TUNNELED CENTRAL VENOUS CATHETER  PROCEDURE: The patient's right chest and catheter was prepped and draped in a normal sterile fashion. Heparin was removed from both ports of catheter. 1% lidocaine was used for local  anesthesia. Using gentle blunt dissection the cuff of the catheter was exposed and the catheter was removed in it's entirety. Pressure was held till hemostasis was obtained. A sterile dressing was applied. The patient tolerated the procedure well with no immediate complications.  COMPLICATIONS: None immediate.  IMPRESSION: Successful catheter removal as described above.  Read by: Jeananne RamaKevin Allred, PA-C   Electronically Signed   By: Oley Balmaniel  Hassell M.D.   On: 09/12/2014 13:51    ASSESSMENT AND PLAN  1. MRSA bacteremia:   - Port-A-Cath suspected as potential source  - pending TEE this afternoon around 2pm by Dr. Shirlee LatchMcLean, depend on result, cardiology potentially sign off   2. ESRD on HD T/Th/Sat  - HD catheter revmoed 09/12/2014 after HD on that day  - nephrology plan for possible Perma-cath by vascular surgery and HD tomorrow   3. HIV, on antiretrovirals, follow by [facility] 4. Mild LV dysfunction on echo w/o sign of heart failure  Hassan BucklerSigned, Meng, Hao PA-C Pager: [phone] Patient  seen and examined  Agree with findings of H Meng.  TEE today.    Dietrich Pates

## 2014-09-14 NOTE — Discharge Summary (Signed)
Physician Discharge Summary  Dave Estrada EKB:[id number] DOB: 1991-03-21 DOA: 09/08/2014  PCP: No primary care provider on file.  Admit date: 09/08/2014 Discharge date: 09/15/14 Recommendations for Outpatient Follow-up:  1. Pt will need to follow up with PCP in 2 weeks post discharge 2. Please obtain CBC once weekly while on vancomycin IV 3. Please give vancomycin IV 1000mg  with each HD with there last dose on dialysis on 10/29/14 which will complete 6 weeks of therapy   Discharge Diagnoses:  Sepsis -Present at the time of admission -Secondary to bacteremia -Improved with antibiotics MRSA bacteremia -Dialysis catheter is the source -09/10/2014 Surveillance blood cultures remain negative -HD catheter removed 09/12/14 after pt had HD -Place new dialysis catheter if surveillance blood cultures remain negative -Continue intravenous vancomycin -TEE--09/14/2014--Residual vegetation from HD catheter in SVC entering IVC -new Tunneled HD catheter 09/15/14 by Dr. Myra Gianotti -The patient will remain on vancomycin for total of 6 weeks. His last dose of vancomycin will be on 10/19/2014 which will complete 6 weeks of therapy -OxycodoneIR 5 mg, #30, one po q 4 hrs prn pain, no RF ESRD -Dialysis Tuesday, Thursday, Saturday -Appreciate nephrology follow-up AIDS -Continue current antiretroviral therapy -Last CD4 count 179 on 06/17/2014 -Last HIV RNA undetectable--06/17/2014 -Restart Bactrim prophylaxis will dose next dose on HD day after HD Liver lesion on 2d echo -LFT's are unremarkable -Follow up RUQ US demonstrating normal liver, no lesions seen  Discharge Condition: stable   Disposition: home  Diet:renal with 1200 cc fluid restriction Wt Readings from Last 3 Encounters:  09/15/14 63.8 kg (140 lb 10.5 oz)  01/08/13 54.432 kg (120 lb)    History of present illness:   23 year old male with a history of ESRD, HIV presented with fevers and chills. He was found to have MRSA  bacteremia. The patient's antibiotics were narrowed to vancomycin. Infectious disease followed the patient. Given the complexity of the situation, Nephrology and Cardiology have both recommended transfer to [facility] to complete work up. The patient was transferred to [facility] on 09/11/2014. TEE is scheduled 09/14/2014.  It revealed Residual vegetation from HD catheter in SVC entering IVC.  His tunnel dialysis catheter will be removed after his HD on 09/12/2014. 09/10/2014 surveillance blood cultures remain negative. Dr. Myra Gianotti placed new [facility] on 09/15/14.   Consultants: ID Nephrology cardiology  Discharge Exam: Filed Vitals:   09/15/14 1411  BP: 116/74  Pulse: 82  Temp:   Resp: 16   Filed Vitals:   09/15/14 1230 09/15/14 1300 09/15/14 1330 09/15/14 1411  BP: 130/83 129/83 120/76 116/74  Pulse: 92 90 83 82  Temp:      TempSrc:    Oral  Resp:    16  Height:      Weight:      SpO2:    97%   General: A&O x 3, NAD, pleasant, cooperative Cardiovascular: RRR, no rub, no gallop, no S3 Respiratory: CTAB, no wheeze, no rhonchi Abdomen:soft, nontender, nondistended, positive bowel sounds Extremities: No edema, No lymphangitis, no petechiae  Discharge Instructions     Medication List    STOP taking these medications        predniSONE 20 MG tablet  Commonly known as:  DELTASONE      TAKE these medications        emtricitabine 200 MG capsule  Commonly known as:  EMTRIVA  Take 200 mg by mouth 2 (two) times a week. Taken after dialysis on Mondays and Fridays     etravirine 100 MG tablet  Commonly  known as:  INTELENCE  Take 200 mg by mouth every 12 (twelve) hours.     oxyCODONE 5 MG immediate release tablet  Commonly known as:  Oxy IR/ROXICODONE  Take 1 tablet (5 mg total) by mouth every 4 (four) hours as needed for severe pain.     raltegravir 400 MG tablet  Commonly known as:  ISENTRESS  Take 400 mg by mouth 2 (two) times daily.     tenofovir 300 MG tablet  Commonly  known as:  VIREAD  Take 300 mg by mouth every Monday. To be taken on Mondays after dialysis     traMADol 50 MG tablet  Commonly known as:  ULTRAM  Take 1 tablet (50 mg total) by mouth every 6 (six) hours as needed for pain.     vancomycin 1 GM/200ML Soln  Commonly known as:  VANCOCIN  Inject 200 mLs (1,000 mg total) into the vein Every Tuesday,Thursday,and Saturday with dialysis. Give last dose on dialysis on 10/19/14         The results of significant diagnostics from this hospitalization (including imaging, microbiology, ancillary and laboratory) are listed below for reference.    Significant Diagnostic Studies: Dg Chest 2 View  09/08/2014   CLINICAL DATA:  Fever for 2 days with cough  EXAM: CHEST  2 VIEW  COMPARISON:  None.  FINDINGS: Cardiac shadow is within normal limits. A right jugular dialysis catheter is seen in satisfactory position. The lungs are clear bilaterally. No effusion is noted. No acute bony abnormality is seen.  IMPRESSION: No acute abnormality noted.   Electronically Signed   By: Alcide Clever M.D.   On: 09/08/2014 17:13   Ct Head Wo Contrast  09/08/2014   CLINICAL DATA:  Headaches and fevers for 2 days  EXAM: CT HEAD WITHOUT CONTRAST  TECHNIQUE: Contiguous axial images were obtained from the base of the skull through the vertex without intravenous contrast.  COMPARISON:  None.  FINDINGS: Bony calvarium is intact. The paranasal sinuses show partial opacification in the maxillary antra as well as some thickening of the nasal passages on the left. May be related to acute sinusitis. No findings to suggest acute hemorrhage, acute infarction or space-occupying mass lesion are noted.  IMPRESSION: Changes in the maxillary antra and left sinus passages likely related to a degree of sinusitis. No other focal abnormality is noted.   Electronically Signed   By: Alcide Clever M.D.   On: 09/08/2014 18:16   US Abdomen Limited  09/10/2014   CLINICAL DATA:  Indeterminate liver lesion  identified on echocardiogram.  EXAM: US ABDOMEN LIMITED - RIGHT UPPER QUADRANT  COMPARISON:  None.  FINDINGS: Common bile duct:  Diameter: Normal diameter.  Liver:  No focal liver lesion identified.  No ductal dilatation.  IMPRESSION: No focal liver lesion identified.   Electronically Signed   By: Genevive Bi M.D.   On: 09/10/2014 16:33   Ir Removal Tun Cv Cath W/o Fl  09/12/2014   CLINICAL DATA:  Patient with history of end-stage renal disease, HIV, MRSA bacteremia and previously placed right internal jugular tunneled hemodialysis catheter at outside facility. Request is now made for hemodialysis catheter removal  EXAM: REMOVAL TUNNELED CENTRAL VENOUS CATHETER  PROCEDURE: The patient's right chest and catheter was prepped and draped in a normal sterile fashion. Heparin was removed from both ports of catheter. 1% lidocaine was used for local anesthesia. Using gentle blunt dissection the cuff of the catheter was exposed and the catheter was removed in it's  entirety. Pressure was held till hemostasis was obtained. A sterile dressing was applied. The patient tolerated the procedure well with no immediate complications.  COMPLICATIONS: None immediate.  IMPRESSION: Successful catheter removal as described above.  Read by: Jeananne Rama, PA-C   Electronically Signed   By: Oley Balm M.D.   On: 09/12/2014 13:51   Dg Chest Port 1 View  09/15/2014   CLINICAL DATA:  Chronic renal disease.  HIV-positive  EXAM: PORTABLE CHEST - 1 VIEW  COMPARISON:  September 08, 2014  FINDINGS: The previously noted right-sided central catheter is been removed. There is a new left-sided central catheter with tip at cavoatrial junction. No pneumothorax. Lungs are clear. Heart is upper normal in size with pulmonary vascularity within normal limits. No adenopathy. No pneumothorax. No blastic or lytic bone lesions. Incidental note is made of an exostosis arising from the inferior clavicle near the coracoclavicular junction.   IMPRESSION: No edema or consolidation. Central catheter as described without pneumothorax.   Electronically Signed   By: Bretta Bang M.D.   On: 09/15/2014 10:24     Microbiology: Recent Results (from the past 240 hour(s))  Blood culture (routine x 2)     Status: None   Collection Time: 09/08/14  4:30 PM  Result Value Ref Range Status   Specimen Description BLOOD RIGHT HAND  Final   Special Requests   Final    BOTTLES DRAWN AEROBIC AND ANAEROBIC AEB=10CC ANA=8CC   Culture  Setup Time   Final    09/09/2014 14:51 Performed at [facility]    Culture   Final    STAPHYLOCOCCUS AUREUS Note: SUSCEPTIBILITIES PERFORMED ON PREVIOUS CULTURE WITHIN THE LAST 5 DAYS. Note: Gram Stain Report Called to,Read Back By and Verified With: [name] [phone] BY HUFFINES 09/09/14 Performed at [facility] Performed at [facility]    Report Status 09/11/2014 FINAL  Final  Blood culture (routine x 2)     Status: None   Collection Time: 09/08/14  4:38 PM  Result Value Ref Range Status   Specimen Description BLOOD LEFT HAND  Final   Special Requests   Final    BOTTLES DRAWN AEROBIC AND ANAEROBIC AEB=14CC ANA=10CC   Culture  Setup Time   Final    09/09/2014 14:46 Performed at [facility]    Culture   Final    METHICILLIN RESISTANT STAPHYLOCOCCUS AUREUS Note: RIFAMPIN AND GENTAMICIN SHOULD NOT BE USED AS SINGLE DRUGS FOR TREATMENT OF STAPH INFECTIONS. CRITICAL RESULT CALLED TO, READ BACK BY AND VERIFIED WITH: [name] 09/10/14 1510 BY [name] This organism DOES NOT demonstrate inducible  Clindamycin resistance in vitro. Note: Gram Stain Report Called to,Read Back By and Verified With: [facility] S AT 0727 BY HUFFINES S 09/09/14 Performed at [facility] Performed at [facility]    Report Status 09/11/2014 FINAL  Final   Organism ID, Bacteria METHICILLIN RESISTANT STAPHYLOCOCCUS AUREUS  Final      Susceptibility   Methicillin resistant  staphylococcus aureus - MIC*    CLINDAMYCIN <=0.25 SENSITIVE Sensitive     ERYTHROMYCIN >=8 RESISTANT Resistant     GENTAMICIN <=0.5 SENSITIVE Sensitive     LEVOFLOXACIN 4 INTERMEDIATE Intermediate     OXACILLIN >=4 RESISTANT Resistant     PENICILLIN >=0.5 RESISTANT Resistant     RIFAMPIN <=0.5 SENSITIVE Sensitive     TRIMETH/SULFA <=10 SENSITIVE Sensitive     VANCOMYCIN <=0.5 SENSITIVE Sensitive     TETRACYCLINE <=1 SENSITIVE Sensitive     *  METHICILLIN RESISTANT STAPHYLOCOCCUS AUREUS  Urine culture     Status: None   Collection Time: 09/08/14  5:25 PM  Result Value Ref Range Status   Specimen Description URINE, CLEAN CATCH  Final   Special Requests NONE  Final   Culture  Setup Time   Final    09/08/2014 23:18 Performed at [facility] Count   Final    30,000 COLONIES/ML Performed at [facility]    Culture   Final    Multiple bacterial morphotypes present, none predominant. Suggest appropriate recollection if clinically indicated. Performed at [facility]    Report Status 09/09/2014 FINAL  Final  Culture, blood (routine x 2)     Status: None   Collection Time: 09/10/14  1:40 PM  Result Value Ref Range Status   Specimen Description BLOOD PORTA CATH DRAWN BY RN  Final   Special Requests BOTTLES DRAWN AEROBIC AND ANAEROBIC 10CC  Final   Culture NO GROWTH 5 DAYS  Final   Report Status 09/15/2014 FINAL  Final  Culture, blood (routine x 2)     Status: None   Collection Time: 09/10/14  2:30 PM  Result Value Ref Range Status   Specimen Description BLOOD PORTA CATH DRAWN BY RN  Final   Special Requests BOTTLES DRAWN AEROBIC AND ANAEROBIC 10CC EACH  Final   Culture NO GROWTH 5 DAYS  Final   Report Status 09/15/2014 FINAL  Final     Labs: Basic Metabolic Panel:  Recent Labs Lab 09/10/14 0635 09/11/14 0649 09/12/14 0822 09/14/14 1015 09/15/14 0818 09/15/14 1240  NA 136* 138 139 139 140 140  K 4.2 4.0 4.2 4.5 4.8 5.4*  CL 101 101 105  102  --  105  CO2 22 25 22 21   --  20  GLUCOSE 102* 105* 126* 91 93 110*  BUN 36* 19 19 18   --  22  CREATININE 8.54* 6.06* 5.90* 6.30*  --  6.73*  CALCIUM 7.9* 8.1* 8.5 8.6  --  8.7  PHOS  --  2.5 2.2* 2.9  --  1.5*   Liver Function Tests:  Recent Labs Lab 09/08/14 1553 09/09/14 0626 09/12/14 0822 09/14/14 1015 09/15/14 1240  AST 20 19  --   --   --   ALT 9 11  --   --   --   ALKPHOS 106 99  --   --   --   BILITOT 1.0 0.9  --   --   --   PROT 8.3 7.5  --   --   --   ALBUMIN 3.6 3.2* 2.8* 2.9* 3.2*   No results for input(s): LIPASE, AMYLASE in the last 168 hours. No results for input(s): AMMONIA in the last 168 hours. CBC:  Recent Labs Lab 09/08/14 1553 09/09/14 0626 09/10/14 0635 09/11/14 0649 09/12/14 0823 09/15/14 0818 09/15/14 1240  WBC 8.3 6.0 7.5 7.1 6.4  --  8.1  NEUTROABS 7.2  --   --   --   --   --   --   HGB 9.9* 9.4* 7.6* 8.1* 7.8* 8.5* 7.8*  HCT 28.7* 27.6* 22.5* 24.4* 23.5* 25.0* 24.2*  MCV 102.1* 102.6* 103.7* 102.5* 100.9*  --  104.3*  PLT 129* 126* 146* 192 260  --  482*   Cardiac Enzymes: No results for input(s): CKTOTAL, CKMB, CKMBINDEX, TROPONINI in the last 168 hours. BNP: Invalid input(s): POCBNP CBG: No results for input(s): GLUCAP in the last 168 hours.  Time coordinating discharge:  Greater than 30 minutes  Signed:  Davinder Haff, DO [facility] Pager: [phone] 09/15/2014, 2:35 PM

## 2014-09-14 NOTE — Progress Notes (Signed)
  Echocardiogram Echocardiogram Transesophageal has been performed.  Dave Estrada 09/14/2014, 3:25 PM

## 2014-09-14 NOTE — CV Procedure (Signed)
Procedure: TEE  Indication: Endocarditis  Sedation: Versed 5 mg IV, 75 mcg Fentanyl, 25 mg Benadryl  Findings: Please see echo section for full report.  Mildly dilated LV with EF estimated 45%, diffuse mild hypokinesis.  Mild mitral regurgitation.  Normal RV size and systolic function.  There was a vegetation 5+cm x 0.9 cm that was in the SVC entering the right atrium.  I suspect this is a cast remaining after HD catheter removal (vegetation was originally around the catheter).  The tricuspid and pulmonic valves did not have vegetations.  Normal aortic valve.    Impression: Residual vegetation from HD catheter in SVC entering IVC, does not appear to involve the right-sided valves.  Mild cardiomyopathy.   Dave Estrada 09/14/2014 3:12 PM

## 2014-09-14 NOTE — Interval H&P Note (Signed)
History and Physical Interval Note:  09/14/2014 2:52 PM  Dave Estrada  has presented today for surgery, with the diagnosis of SBE  The various methods of treatment have been discussed with the patient and family. After consideration of risks, benefits and other options for treatment, the patient has consented to  Procedure(s) with comments: TRANSESOPHAGEAL ECHOCARDIOGRAM (TEE) (N/A) - will be at [facility] by mon as a surgical intervention .  The patient's history has been reviewed, patient examined, no change in status, stable for surgery.  I have reviewed the patient's chart and labs.  Questions were answered to the patient's satisfaction.     Sherman Donaldson [employer]

## 2014-09-14 NOTE — H&P (View-Only) (Signed)
 Patient Name: Dave Estrada Date of Encounter: 09/14/2014     Principal Problem:   MRSA bacteremia Active Problems:   HIV (human immunodeficiency virus infection)   ESRD on hemodialysis   Staphylococcus aureus bacteremia with sepsis   Macrocytic anemia   Cigarette smoker   Liver lesion   Asthma, chronic   History of MRSA infection   AIDS   End stage renal disease    SUBJECTIVE  Denies any discomfort, no CP or SOB. Pending TEE today.   CURRENT MEDS . emtricitabine  200 mg Oral Once per day on Mon Fri  . epoetin alfa  10,000 Units Intravenous Q T,Th,Sa-HD  . etravirine  200 mg Oral BID WC  . heparin subcutaneous  5,000 Units Subcutaneous 3 times per day  . raltegravir  400 mg Oral BID  . tenofovir  300 mg Oral Q Mon  . vancomycin  750 mg Intravenous Q T,Th,Sa-HD    OBJECTIVE  Filed Vitals:   09/13/14 1741 09/13/14 2003 09/14/14 0526 09/14/14 1000  BP: 135/75 126/73 108/61 122/78  Pulse: 95 88 89 94  Temp: 97 F (36.1 C) 98 F (36.7 C) 98.2 F (36.8 C) 98.1 F (36.7 C)  TempSrc: Oral   Oral  Resp: 18 18 19 20  Height:      Weight:  135 lb (61.236 kg)    SpO2: 96% 100% 99% 100%    Intake/Output Summary (Last 24 hours) at 09/14/14 1024 Last data filed at 09/14/14 0900  Gross per 24 hour  Intake    960 ml  Output      0 ml  Net    960 ml   Filed Weights   09/12/14 1114 09/12/14 2059 09/13/14 2003  Weight: 136 lb 11 oz (62 kg) 138 lb (62.596 kg) 135 lb (61.236 kg)    PHYSICAL EXAM  General: Pleasant, NAD. Neuro: Alert and oriented X 3. Moves all extremities spontaneously. Psych: Normal affect. HEENT:  Normal  Neck: Supple without bruits or JVD. Lungs:  Resp regular and unlabored, CTA. Heart: RRR no s3, s4, or murmurs. Abdomen: Soft, non-tender, non-distended, BS + x 4.  Extremities: No clubbing, cyanosis or edema. DP/PT/Radials 2+ and equal bilaterally.  Accessory Clinical Findings  CBC  Recent Labs  09/12/14 0823  WBC 6.4  HGB 7.8*   HCT 23.5*  MCV 100.9*  PLT 260   Basic Metabolic Panel  Recent Labs  09/12/14 0822  NA 139  K 4.2  CL 105  CO2 22  GLUCOSE 126*  BUN 19  CREATININE 5.90*  CALCIUM 8.5  PHOS 2.2*   Liver Function Tests  Recent Labs  09/12/14 0822  ALBUMIN 2.8*    TELE Not on telemetry     Echo 09/09/2014 LV EF: 45% -  50%  ------------------------------------------------------------------- Indications:   Fever 780.6.  ------------------------------------------------------------------- History:  Functional status:  Dialysis-dependent renal failure. Risk factors: HIV, Marijuana use. Current tobacco use.  ------------------------------------------------------------------- Study Conclusions  - Left ventricle: The cavity size was normal. Wall thickness was normal. Systolic function was mildly reduced. The estimated ejection fraction was in the range of 45% to 50%. Left ventricular diastolic function parameters were normal. - Aortic valve: Mildly calcified annulus. Trileaflet; mildly thickened leaflets. - Mitral valve: Mildly calcified annulus. Mildly thickened leaflets . - Left atrium: The atrium was mildly dilated. - Right atrium: The catheter tip is not visualized well enough to exclude possible vegetation given known bacteremia. - Pericardium, extracardiac: There is a 1.8 x 1.0 cm   echogenic atypical structure noted in the liver. Consider focused liver US study. - Impressions: No evidence of valvular vegetation. Cannot exlcude possible RA catheter vegetation. - Technically adequate study.  Impressions:  - No evidence of valvular vegetation. Cannot exlcude possible RA catheter vegetation.     Radiology/Studies  Dg Chest 2 View  09/08/2014   CLINICAL DATA:  Fever for 2 days with cough  EXAM: CHEST  2 VIEW  COMPARISON:  None.  FINDINGS: Cardiac shadow is within normal limits. A right jugular dialysis catheter is seen in satisfactory  position. The lungs are clear bilaterally. No effusion is noted. No acute bony abnormality is seen.  IMPRESSION: No acute abnormality noted.   Electronically Signed   By: Mark  Lukens M.D.   On: 09/08/2014 17:13   Ct Head Wo Contrast  09/08/2014   CLINICAL DATA:  Headaches and fevers for 2 days  EXAM: CT HEAD WITHOUT CONTRAST  TECHNIQUE: Contiguous axial images were obtained from the base of the skull through the vertex without intravenous contrast.  COMPARISON:  None.  FINDINGS: Bony calvarium is intact. The paranasal sinuses show partial opacification in the maxillary antra as well as some thickening of the nasal passages on the left. May be related to acute sinusitis. No findings to suggest acute hemorrhage, acute infarction or space-occupying mass lesion are noted.  IMPRESSION: Changes in the maxillary antra and left sinus passages likely related to a degree of sinusitis. No other focal abnormality is noted.   Electronically Signed   By: Mark  Lukens M.D.   On: 09/08/2014 18:16   Us Abdomen Limited  09/10/2014   CLINICAL DATA:  Indeterminate liver lesion identified on echocardiogram.  EXAM: US ABDOMEN LIMITED - RIGHT UPPER QUADRANT  COMPARISON:  None.  FINDINGS: Common bile duct:  Diameter: Normal diameter.  Liver:  No focal liver lesion identified.  No ductal dilatation.  IMPRESSION: No focal liver lesion identified.   Electronically Signed   By: Stewart  Edmunds M.D.   On: 09/10/2014 16:33   Ir Removal Tun Cv Cath W/o Fl  09/12/2014   CLINICAL DATA:  Patient with history of end-stage renal disease, HIV, MRSA bacteremia and previously placed right internal jugular tunneled hemodialysis catheter at outside facility. Request is now made for hemodialysis catheter removal  EXAM: REMOVAL TUNNELED CENTRAL VENOUS CATHETER  PROCEDURE: The patient's right chest and catheter was prepped and draped in a normal sterile fashion. Heparin was removed from both ports of catheter. 1% lidocaine was used for local  anesthesia. Using gentle blunt dissection the cuff of the catheter was exposed and the catheter was removed in it's entirety. Pressure was held till hemostasis was obtained. A sterile dressing was applied. The patient tolerated the procedure well with no immediate complications.  COMPLICATIONS: None immediate.  IMPRESSION: Successful catheter removal as described above.  Read by: Kevin Allred, PA-C   Electronically Signed   By: Daniel  Hassell M.D.   On: 09/12/2014 13:51    ASSESSMENT AND PLAN  1. MRSA bacteremia:   - Port-A-Cath suspected as potential source  - pending TEE this afternoon around 2pm by Dr. McLean, depend on result, cardiology potentially sign off   2. ESRD on HD T/Th/Sat  - HD catheter revmoed 09/12/2014 after HD on that day  - nephrology plan for possible Perma-cath by vascular surgery and HD tomorrow   3. HIV, on antiretrovirals, follow by [facility] 4. Mild LV dysfunction on echo w/o sign of heart failure  Signed, Meng, Hao PA-C Pager: [phone] Patient   seen and examined  Agree with findings of H Meng.  TEE today.    Dave Estrada  

## 2014-09-14 NOTE — Consult Note (Signed)
[facility] [place] [place] NOTE   MRN : [id number]  Reason for Consult: ESRD Referring Physician: Dr. Briant Cedar  History of Present Illness: 23 year old male who  has a past medical history of HIV (human immunodeficiency virus infection) and Renal disorder.  He is currently on dialysis T-TH-Sat.  He was admitted with fever of 103 and blood cultures were positive for MRSA bacteremia and the patient was appropriately treated with antimicrobials Vanc and Ancef.   His tunnel catheter was removed by radiology and sent for culture 09/12/2014.   We have been asked to provide a a diatek for dialysis access ASAP.  He denies DM, hypercholesterolemia or hypertension.      Current Facility-Administered Medications  Medication Dose Route Frequency Provider Last Rate Last Dose  . 0.9 %  sodium chloride infusion  100 mL Intravenous PRN Manpreet Lucio Edward, MD      . 0.9 %  sodium chloride infusion  100 mL Intravenous PRN Manpreet Lucio Edward, MD      . acetaminophen (TYLENOL) tablet 650 mg  650 mg Oral Q6H PRN Meredeth Ide, MD   650 mg at 09/09/14 1559   Or  . acetaminophen (TYLENOL) suppository 650 mg  650 mg Rectal Q6H PRN Meredeth Ide, MD      . emtricitabine (EMTRIVA) capsule 200 mg  200 mg Oral Once per day on Mon Fri Meredeth Ide, MD   200 mg at 09/11/14 1215  . epoetin alfa (EPOGEN,PROCRIT) injection 10,000 Units  10,000 Units Intravenous Q T,Th,Sa-HD Jamse Mead, MD   10,000 Units at 09/10/14 1300  . etravirine (INTELENCE) tablet 200 mg  200 mg Oral BID WC Catarina Hartshorn, MD   200 mg at 09/13/14 2158  . feeding supplement (NEPRO CARB STEADY) liquid 237 mL  237 mL Oral PRN Manpreet Lucio Edward, MD      . heparin injection 1,300 Units  20 Units/kg Dialysis PRN Randa Lynn, MD   1,300 Units at 09/10/14 1324  . heparin injection 3,800 Units  3,800 Units Dialysis PRN Jamse Mead, MD   3,800 Units at 09/10/14 1545  . heparin injection 5,000 Units  5,000 Units  Subcutaneous 3 times per day Meredeth Ide, MD   5,000 Units at 09/08/14 2335  . lidocaine (PF) (XYLOCAINE) 1 % injection 5 mL  5 mL Intradermal PRN Manpreet Lucio Edward, MD      . lidocaine-prilocaine (EMLA) cream 1 application  1 application Topical PRN Manpreet Lucio Edward, MD      . ondansetron (ZOFRAN) tablet 4 mg  4 mg Oral Q6H PRN Meredeth Ide, MD       Or  . ondansetron (ZOFRAN) injection 4 mg  4 mg Intravenous Q6H PRN Meredeth Ide, MD      . pentafluoroprop-tetrafluoroeth (GEBAUERS) aerosol 1 application  1 application Topical PRN Manpreet Lucio Edward, MD      . raltegravir (ISENTRESS) tablet 400 mg  400 mg Oral BID Meredeth Ide, MD   400 mg at 09/13/14 2158  . sodium chloride 0.9 % injection 10 mL  10 mL Intravenous PRN Jamse Mead, MD   10 mL at 09/10/14 1530  . sodium chloride 0.9 % injection 10 mL  10 mL Intravenous PRN Jamse Mead, MD   10 mL at 09/10/14 1530  . tenofovir (VIREAD) tablet 300 mg  300 mg Oral Q Mon Meredeth Ide, MD      . vancomycin [facility]) IVPB  750 mg/150 ml premix  750 mg Intravenous Q T,Th,Sa-HD [facility], RPH   750 mg at 09/12/14 1057    Pt meds include: Statin :No Betablocker: No ASA: No Other anticoagulants/antiplatelets: none  Past Medical History  Diagnosis Date  . HIV (human immunodeficiency virus infection)   . ESRD (end stage renal disease)     Past Surgical History  Procedure Laterality Date  . Port a cath revision      Social History History  Substance Use Topics  . Smoking status: Current Every Day Smoker -- 1.00 packs/day for 11 years    Types: Cigarettes  . Smokeless tobacco: Never Used  . Alcohol Use: No    Family History Family History  Problem Relation Age of Onset  . Seizures Father     Allergies  Allergen Reactions  . Aspirin Other (See Comments)    Reaction is unknown     REVIEW OF SYSTEMS  General: [ ]  Weight loss, [x ] Fever, [ ]  chills Neurologic: [ ]  Dizziness, [ ]  Blackouts, [ ]   Seizure [ ]  Stroke, [ ]  "Mini stroke", [ ]  Slurred speech, [ ]  Temporary blindness; [ ]  weakness in arms or legs, [ ]  Hoarseness [ ]  Dysphagia Cardiac: [ ]  Chest pain/pressure, [ ]  Shortness of breath at rest [ ]  Shortness of breath with exertion, [ ]  Atrial fibrillation or irregular heartbeat  Vascular: [ ]  Pain in legs with walking, [ ]  Pain in legs at rest, [ ]  Pain in legs at night,  [ ]  Non-healing ulcer, [ ]  Blood clot in vein/DVT,   Pulmonary: [ ]  Home oxygen, [ ]  Productive cough, [ ]  Coughing up blood, [ ]  Asthma,  [ ]  Wheezing [ ]  COPD Musculoskeletal:  [ ]  Arthritis, [ ]  Low back pain, [ ]  Joint pain Hematologic: [ ]  Easy Bruising, [ ]  Anemia; [ ]  Hepatitis Gastrointestinal: [ ]  Blood in stool, [ ]  Gastroesophageal Reflux/heartburn, Urinary: [ ]  chronic Kidney disease, [x ] on HD - [ ]  MWF or [ x] TTHS, [ ]  Burning with urination, [ ]  Difficulty urinating Skin: [ ]  Rashes, [ ]  Wounds Psychological: [ ]  Anxiety, [ ]  Depression  Physical Examination Filed Vitals:   09/13/14 1741 09/13/14 2003 09/14/14 0526 09/14/14 1000  BP: 135/75 126/73 108/61 122/78  Pulse: 95 88 89 94  Temp: 97 F (36.1 C) 98 F (36.7 C) 98.2 F (36.8 C) 98.1 F (36.7 C)  TempSrc: Oral   Oral  Resp: 18 18 19 20   Height:      Weight:  135 lb (61.236 kg)    SpO2: 96% 100% 99% 100%   Body mass index is 22.47 kg/(m^2).  General:  WDWN in NAD Gait: Normal HENT: WNL Eyes: Pupils equal Pulmonary: normal non-labored breathing , without rales, rhonchi,  wheezing Cardiac: RRR, without  murmurs, rubs or gallops; Abdomen: soft, NT, no masses Skin: no rashes, ulcers noted;  no gangrene , no cellulitis; no open wounds;  Vascular Exam/Pulses: 2+ radial pulses and 2+ dorsalis pedis pulses bilaterally Musculoskeletal: no muscle wasting or atrophy; no edema  Neurologic: A&O X 3; Appropriate Affect; SENSATION: normal; MOTOR FUNCTION: 5/5 SymmetricSpeech is fluent/normal  Significant Diagnostic Studies: CBC Lab  Results  Component Value Date   WBC 6.4 09/12/2014   HGB 7.8* 09/12/2014   HCT 23.5* 09/12/2014   MCV 100.9* 09/12/2014   PLT 260 09/12/2014    BMET    Component Value Date/Time   NA 139 09/12/2014 5364  K 4.2 09/12/2014 0822   CL 105 09/12/2014 0822   CO2 22 09/12/2014 0822   GLUCOSE 126* 09/12/2014 0822   BUN 19 09/12/2014 0822   CREATININE 5.90* 09/12/2014 0822   CALCIUM 8.5 09/12/2014 0822   GFRNONAA 12* 09/12/2014 0822   GFRAA 14* 09/12/2014 0822   Estimated Creatinine Clearance: 16.6 mL/min (by C-G formula based on Cr of 5.9).   ASSESSMENT/PLAN: This is a 23 year old male with ESRD on HD TTS admitted with MRSA bacteremia and sepsis.  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   -His HD catheter was removed on 09/12/14. We will plan for tunneled dialysis catheter placement tomorrow with Dr. Myra GianottiBrabham.    Maris BergerKimberly Trinh, PA-C [facility] [place] Office: [phone] Pager: [phone]   History and exam details as above.  Diatek Dr Myra GianottiBrabham tomorrow.  Fabienne Brunsharles Fields, MD [facility] [place] Office: [phone] Pager: [phone]

## 2014-09-14 NOTE — Progress Notes (Signed)
PROGRESS NOTE  Dave Estrada DDU:[id number] DOB: [birth year] DOA: 09/08/2014 PCP: No primary care provider on file.   Brief history 23 year old male with a history of ESRD, HIV presented with fevers and chills. He was found to have MRSA bacteremia. The patient's antibiotics were narrowed to vancomycin. Infectious disease followed the patient. Given the complexity of the situation, Nephrology and Cardiology have both recommended transfer to [facility] to complete work up. The patient was transferred to [facility] on 09/11/2014. TEE is scheduled 09/14/2014. His tunnel dialysis catheter will be removed after his HD on 09/12/2014. 09/10/2014 surveillance blood cultures remain negative.  Assessment/Plan: Sepsis -Present at the time of admission -Secondary to bacteremia -Improved with antibiotics MRSA bacteremia -Dialysis catheter is the likely source -09/10/2014 Surveillance blood cultures remain negative -HD catheter removed 09/12/14 after pt had HD -Place new dialysis catheter if surveillance blood cultures remain negative -Continue intravenous vancomycin -TEE--09/14/2014--Residual vegetation from HD catheter in SVC entering IVC -new  Tunneled HD catheter 09/15/14 by Dr. Myra Gianotti ESRD -Dialysis Tuesday, Thursday, Saturday -Appreciate nephrology follow-up AIDS -Continue antiretroviral therapy -Last CD4 count 179 on 06/17/2014 -Last HIV RNA undetectable--06/17/2014 -Restart Bactrim prophylaxis x 1 today--will dose next dose on HD day after HD Liver lesion on 2d echo -LFT's are unremarkable -Follow up RUQ US demonstrating normal liver, no lesions seen   Family Communication: family updated at bedside 09/12/14 Disposition Plan: Home when medically stable    Procedures/Studies: Dg Chest 2 View  09/08/2014   CLINICAL DATA:  Fever for 2 days with cough  EXAM: CHEST  2 VIEW  COMPARISON:  None.  FINDINGS: Cardiac shadow is within normal limits. A right jugular dialysis  catheter is seen in satisfactory position. The lungs are clear bilaterally. No effusion is noted. No acute bony abnormality is seen.  IMPRESSION: No acute abnormality noted.   Electronically Signed   By: Alcide Clever M.D.   On: 09/08/2014 17:13   Ct Head Wo Contrast  09/08/2014   CLINICAL DATA:  Headaches and fevers for 2 days  EXAM: CT HEAD WITHOUT CONTRAST  TECHNIQUE: Contiguous axial images were obtained from the base of the skull through the vertex without intravenous contrast.  COMPARISON:  None.  FINDINGS: Bony calvarium is intact. The paranasal sinuses show partial opacification in the maxillary antra as well as some thickening of the nasal passages on the left. May be related to acute sinusitis. No findings to suggest acute hemorrhage, acute infarction or space-occupying mass lesion are noted.  IMPRESSION: Changes in the maxillary antra and left sinus passages likely related to a degree of sinusitis. No other focal abnormality is noted.   Electronically Signed   By: Alcide Clever M.D.   On: 09/08/2014 18:16   US Abdomen Limited  09/10/2014   CLINICAL DATA:  Indeterminate liver lesion identified on echocardiogram.  EXAM: US ABDOMEN LIMITED - RIGHT UPPER QUADRANT  COMPARISON:  None.  FINDINGS: Common bile duct:  Diameter: Normal diameter.  Liver:  No focal liver lesion identified.  No ductal dilatation.  IMPRESSION: No focal liver lesion identified.   Electronically Signed   By: Genevive Bi M.D.   On: 09/10/2014 16:33   Ir Removal Tun Cv Cath W/o Fl  09/12/2014   CLINICAL DATA:  Patient with history of end-stage renal disease, HIV, MRSA bacteremia and previously placed right internal jugular tunneled hemodialysis catheter at outside facility. Request is now made for hemodialysis catheter removal  EXAM: REMOVAL TUNNELED CENTRAL VENOUS  CATHETER  PROCEDURE: The patient's right chest and catheter was prepped and draped in a normal sterile fashion. Heparin was removed from both ports of catheter. 1%  lidocaine was used for local anesthesia. Using gentle blunt dissection the cuff of the catheter was exposed and the catheter was removed in it's entirety. Pressure was held till hemostasis was obtained. A sterile dressing was applied. The patient tolerated the procedure well with no immediate complications.  COMPLICATIONS: None immediate.  IMPRESSION: Successful catheter removal as described above.  Read by: Jeananne Rama, PA-C   Electronically Signed   By: Oley Balm M.D.   On: 09/12/2014 13:51         Subjective: Patient denies fevers, chills, headache, chest pain, dyspnea, nausea, vomiting, diarrhea, abdominal pain, dysuria, hematuria   Objective: Filed Vitals:   09/14/14 1520 09/14/14 1521 09/14/14 1527 09/14/14 1530  BP: 148/90  137/87   Pulse:  97 93 92  Temp:      TempSrc:      Resp:  18 19 17   Height:      Weight:      SpO2:  99% 98% 99%    Intake/Output Summary (Last 24 hours) at 09/14/14 1616 Last data filed at 09/14/14 1531  Gross per 24 hour  Intake    920 ml  Output      0 ml  Net    920 ml   Weight change: -3.064 kg (-6 lb 12.1 oz) Exam:   General:  Pt is alert, follows commands appropriately, not in acute distress  HEENT: No icterus, No thrush,  [place]/AT  Cardiovascular: RRR, S1/S2, no rubs, no gallops  Respiratory: CTA bilaterally, no wheezing, no crackles, no rhonchi  Abdomen: Soft/+BS, non tender, non distended, no guarding  Extremities: No edema, No lymphangitis, No petechiae, No rashes, no synovitis  Data Reviewed: Basic Metabolic Panel:  Recent Labs Lab 09/09/14 0626 09/10/14 0635 09/11/14 0649 09/12/14 0822 09/14/14 1015  NA 138 136* 138 139 139  K 4.0 4.2 4.0 4.2 4.5  CL 99 101 101 105 102  CO2 25 22 25 22 21   GLUCOSE 97 102* 105* 126* 91  BUN 35* 36* 19 19 18   CREATININE 8.41* 8.54* 6.06* 5.90* 6.30*  CALCIUM 7.8* 7.9* 8.1* 8.5 8.6  PHOS  --   --  2.5 2.2* 2.9   Liver Function Tests:  Recent Labs Lab 09/08/14 1553  09/09/14 0626 09/12/14 0822 09/14/14 1015  AST 20 19  --   --   ALT 9 11  --   --   ALKPHOS 106 99  --   --   BILITOT 1.0 0.9  --   --   PROT 8.3 7.5  --   --   ALBUMIN 3.6 3.2* 2.8* 2.9*   No results for input(s): LIPASE, AMYLASE in the last 168 hours. No results for input(s): AMMONIA in the last 168 hours. CBC:  Recent Labs Lab 09/08/14 1553 09/09/14 0626 09/10/14 0635 09/11/14 0649 09/12/14 0823  WBC 8.3 6.0 7.5 7.1 6.4  NEUTROABS 7.2  --   --   --   --   HGB 9.9* 9.4* 7.6* 8.1* 7.8*  HCT 28.7* 27.6* 22.5* 24.4* 23.5*  MCV 102.1* 102.6* 103.7* 102.5* 100.9*  PLT 129* 126* 146* 192 260   Cardiac Enzymes: No results for input(s): CKTOTAL, CKMB, CKMBINDEX, TROPONINI in the last 168 hours. BNP: Invalid input(s): POCBNP CBG: No results for input(s): GLUCAP in the last 168 hours.  Recent Results (from the past  240 hour(s))  Blood culture (routine x 2)     Status: None   Collection Time: 09/08/14  4:30 PM  Result Value Ref Range Status   Specimen Description BLOOD RIGHT HAND  Final   Special Requests   Final    BOTTLES DRAWN AEROBIC AND ANAEROBIC AEB=10CC ANA=8CC   Culture  Setup Time   Final    09/09/2014 14:51 Performed at [facility]    Culture   Final    STAPHYLOCOCCUS AUREUS Note: SUSCEPTIBILITIES PERFORMED ON PREVIOUS CULTURE WITHIN THE LAST 5 DAYS. Note: Gram Stain Report Called to,Read Back By and Verified With: Loralie ChampagneMCKINNEY S [phone] BY HUFFINES 09/09/14 Performed at [facility] Performed at [facility]    Report Status 09/11/2014 FINAL  Final  Blood culture (routine x 2)     Status: None   Collection Time: 09/08/14  4:38 PM  Result Value Ref Range Status   Specimen Description BLOOD LEFT HAND  Final   Special Requests   Final    BOTTLES DRAWN AEROBIC AND ANAEROBIC AEB=14CC ANA=10CC   Culture  Setup Time   Final    09/09/2014 14:46 Performed at [facility]    Culture   Final    METHICILLIN RESISTANT STAPHYLOCOCCUS  AUREUS Note: RIFAMPIN AND GENTAMICIN SHOULD NOT BE USED AS SINGLE DRUGS FOR TREATMENT OF STAPH INFECTIONS. CRITICAL RESULT CALLED TO, READ BACK BY AND VERIFIED WITH: [name] 09/10/14 1510 BY [name] This organism DOES NOT demonstrate inducible  Clindamycin resistance in vitro. Note: Gram Stain Report Called to,Read Back By and Verified With: [facility] S AT 0727 BY HUFFINES S 09/09/14 Performed at [facility] Performed at [facility]    Report Status 09/11/2014 FINAL  Final   Organism ID, Bacteria METHICILLIN RESISTANT STAPHYLOCOCCUS AUREUS  Final      Susceptibility   Methicillin resistant staphylococcus aureus - MIC*    CLINDAMYCIN <=0.25 SENSITIVE Sensitive     ERYTHROMYCIN >=8 RESISTANT Resistant     GENTAMICIN <=0.5 SENSITIVE Sensitive     LEVOFLOXACIN 4 INTERMEDIATE Intermediate     OXACILLIN >=4 RESISTANT Resistant     PENICILLIN >=0.5 RESISTANT Resistant     RIFAMPIN <=0.5 SENSITIVE Sensitive     TRIMETH/SULFA <=10 SENSITIVE Sensitive     VANCOMYCIN <=0.5 SENSITIVE Sensitive     TETRACYCLINE <=1 SENSITIVE Sensitive     * METHICILLIN RESISTANT STAPHYLOCOCCUS AUREUS  Urine culture     Status: None   Collection Time: 09/08/14  5:25 PM  Result Value Ref Range Status   Specimen Description URINE, CLEAN CATCH  Final   Special Requests NONE  Final   Culture  Setup Time   Final    09/08/2014 23:18 Performed at [facility]    Colony Count   Final    30,000 COLONIES/ML Performed at [facility]    Culture   Final    Multiple bacterial morphotypes present, none predominant. Suggest appropriate recollection if clinically indicated. Performed at [facility]    Report Status 09/09/2014 FINAL  Final  Culture, blood (routine x 2)     Status: None (Preliminary result)   Collection Time: 09/10/14  1:40 PM  Result Value Ref Range Status   Specimen Description PORTA CATH  Final   Special Requests BOTTLES DRAWN AEROBIC AND ANAEROBIC  10CC  Final   Culture NO GROWTH 4 DAYS  Final   Report Status PENDING  Incomplete  Culture, blood (routine x 2)     Status:  None (Preliminary result)   Collection Time: 09/10/14  2:30 PM  Result Value Ref Range Status   Specimen Description PORTA CATH  Final   Special Requests BOTTLES DRAWN AEROBIC AND ANAEROBIC 10CC EACH  Final   Culture NO GROWTH 4 DAYS  Final   Report Status PENDING  Incomplete     Scheduled Meds: . emtricitabine  200 mg Oral Once per day on Mon Fri  . epoetin alfa  10,000 Units Intravenous Q T,Th,Sa-HD  . etravirine  200 mg Oral BID WC  . heparin subcutaneous  5,000 Units Subcutaneous 3 times per day  . raltegravir  400 mg Oral BID  . tenofovir  300 mg Oral Q Mon  . vancomycin  750 mg Intravenous Q T,Th,Sa-HD   Continuous Infusions: . sodium chloride 500 mL (09/14/14 1356)     Deaken Jurgens, DO  [facility] Pager [phone]  If 7PM-7AM, please contact night-coverage [email or web address] Password [email or web address] 09/14/2014, 4:16 PM   LOS: 6 days

## 2014-09-14 NOTE — Progress Notes (Addendum)
Patient ID: Dave Estrada, male   DOB: Mar 27, 1991, 23 y.o.   MRN: [id number]         [facility]    Date of Admission:  09/08/2014   Total days of antibiotics 7          Principal Problem:   MRSA bacteremia Active Problems:   HIV (human immunodeficiency virus infection)   ESRD on hemodialysis   Staphylococcus aureus bacteremia with sepsis   Macrocytic anemia   Cigarette smoker   Liver lesion   Asthma, chronic   History of MRSA infection   AIDS   End stage renal disease  S: afebrile, getting TEE today  . emtricitabine  200 mg Oral Once per day on Mon Fri  . epoetin alfa  10,000 Units Intravenous Q T,Th,Sa-HD  . etravirine  200 mg Oral BID WC  . heparin subcutaneous  5,000 Units Subcutaneous 3 times per day  . raltegravir  400 mg Oral BID  . tenofovir  300 mg Oral Q Mon  . vancomycin  750 mg Intravenous Q T,Th,Sa-HD    OBJECTIVE: Blood pressure 122/78, pulse 94, temperature 98.1 F (36.7 C), temperature source Oral, resp. rate 20, height 5\' 5"  (1.651 m), weight 135 lb (61.236 kg), SpO2 100 %. Physical Exam  Constitutional: He is oriented to person, place, and time. He appears well-developed and well-nourished. No distress.  HENT:  Mouth/Throat: Oropharynx is clear and moist. No oropharyngeal exudate.  Chest wall = well healed scab from HD removal Cardiovascular: Normal rate, regular rhythm and normal heart sounds. Exam reveals no gallop and no friction rub.  No murmur heard.  Pulmonary/Chest: Effort normal and breath sounds normal. No respiratory distress. He has no wheezes.  Skin: Skin is warm and dry. No rash noted. No erythema.  Psychiatric: He has a normal mood and affect. His behavior is normal.    Lab Results Lab Results  Component Value Date   WBC 6.4 09/12/2014   HGB 7.8* 09/12/2014   HCT 23.5* 09/12/2014   MCV 100.9* 09/12/2014   PLT 260 09/12/2014    Lab Results  Component Value Date   CREATININE 6.30* 09/14/2014   BUN 18 09/14/2014   NA 139 09/14/2014   K 4.5 09/14/2014   CL 102 09/14/2014   CO2 21 09/14/2014    Lab Results  Component Value Date   ALT 11 09/09/2014   AST 19 09/09/2014   ALKPHOS 99 09/09/2014   BILITOT 0.9 09/09/2014     Microbiology: Recent Results (from the past 240 hour(s))  Blood culture (routine x 2)     Status: None   Collection Time: 09/08/14  4:30 PM  Result Value Ref Range Status   Specimen Description BLOOD RIGHT HAND  Final   Special Requests   Final    BOTTLES DRAWN AEROBIC AND ANAEROBIC AEB=10CC ANA=8CC   Culture  Setup Time   Final    09/09/2014 14:51 Performed at [facility]    Culture   Final    STAPHYLOCOCCUS AUREUS Note: SUSCEPTIBILITIES PERFORMED ON PREVIOUS CULTURE WITHIN THE LAST 5 DAYS. Note: Gram Stain Report Called to,Read Back By and Verified With: [name] [phone] BY HUFFINES 09/09/14 Performed at [facility] Performed at [facility]    Report Status 09/11/2014 FINAL  Final  Blood culture (routine x 2)     Status: None   Collection Time: 09/08/14  4:38 PM  Result Value Ref Range Status   Specimen Description BLOOD LEFT  HAND  Final   Special Requests   Final    BOTTLES DRAWN AEROBIC AND ANAEROBIC AEB=14CC ANA=10CC   Culture  Setup Time   Final    09/09/2014 14:46 Performed at [facility]    Culture   Final    METHICILLIN RESISTANT STAPHYLOCOCCUS AUREUS Note: RIFAMPIN AND GENTAMICIN SHOULD NOT BE USED AS SINGLE DRUGS FOR TREATMENT OF STAPH INFECTIONS. CRITICAL RESULT CALLED TO, READ BACK BY AND VERIFIED WITH: [name] 09/10/14 1510 BY [name] This organism DOES NOT demonstrate inducible  Clindamycin resistance in vitro. Note: Gram Stain Report Called to,Read Back By and Verified With: [facility] S AT 0727 BY HUFFINES S 09/09/14 Performed at [facility] Performed at [facility]    Report Status 09/11/2014 FINAL  Final   Organism ID, Bacteria METHICILLIN RESISTANT STAPHYLOCOCCUS  AUREUS  Final      Susceptibility   Methicillin resistant staphylococcus aureus - MIC*    CLINDAMYCIN <=0.25 SENSITIVE Sensitive     ERYTHROMYCIN >=8 RESISTANT Resistant     GENTAMICIN <=0.5 SENSITIVE Sensitive     LEVOFLOXACIN 4 INTERMEDIATE Intermediate     OXACILLIN >=4 RESISTANT Resistant     PENICILLIN >=0.5 RESISTANT Resistant     RIFAMPIN <=0.5 SENSITIVE Sensitive     TRIMETH/SULFA <=10 SENSITIVE Sensitive     VANCOMYCIN <=0.5 SENSITIVE Sensitive     TETRACYCLINE <=1 SENSITIVE Sensitive     * METHICILLIN RESISTANT STAPHYLOCOCCUS AUREUS  Urine culture     Status: None   Collection Time: 09/08/14  5:25 PM  Result Value Ref Range Status   Specimen Description URINE, CLEAN CATCH  Final   Special Requests NONE  Final   Culture  Setup Time   Final    09/08/2014 23:18 Performed at [facility]   Final    30,000 COLONIES/ML Performed at [facility]    Culture   Final    Multiple bacterial morphotypes present, none predominant. Suggest appropriate recollection if clinically indicated. Performed at [facility]    Report Status 09/09/2014 FINAL  Final  Culture, blood (routine x 2)     Status: None (Preliminary result)   Collection Time: 09/10/14  1:40 PM  Result Value Ref Range Status   Specimen Description PORTA CATH  Final   Special Requests BOTTLES DRAWN AEROBIC AND ANAEROBIC 10CC  Final   Culture NO GROWTH 3 DAYS  Final   Report Status PENDING  Incomplete  Culture, blood (routine x 2)     Status: None (Preliminary result)   Collection Time: 09/10/14  2:30 PM  Result Value Ref Range Status   Specimen Description PORTA CATH  Final   Special Requests BOTTLES DRAWN AEROBIC AND ANAEROBIC 10CC EACH  Final   Culture NO GROWTH 3 DAYS  Final   Report Status PENDING  Incomplete    Assessment: 23yo M with longstanding HIV disease on HD for ESRD who presents with recurrent MRSA bacteremia. Continue on vancomycin alone. hemodialysis  catheter removed by IR on 11/28. Would use day #1 of antibiotics as 11/29. TEE is scheduled for today.  Optimal duration of therapy with vancomycin will depend on TEE results.  Plan: 1. Continue vancomycin 2. Await TEE results to determine length of therapy for vancomycin to be dosed after HD 3.  HIV disease, well controlled = continue with raltegravir BID, tenofovir weekly, and emtricitabine as currently dose.  Dr. Daiva Eves to provide further recs tomorrow.  Judyann Munson, MD [facility]  for Infectious Disease [facility] [phone] pager   [phone] cell 09/14/2014, 11:50 AM

## 2014-09-15 ENCOUNTER — Encounter (HOSPITAL_COMMUNITY): Payer: Self-pay | Admitting: Cardiology

## 2014-09-15 ENCOUNTER — Inpatient Hospital Stay (HOSPITAL_COMMUNITY): Payer: Medicaid Other

## 2014-09-15 ENCOUNTER — Inpatient Hospital Stay (HOSPITAL_COMMUNITY): Payer: Medicaid Other | Admitting: Certified Registered Nurse Anesthetist

## 2014-09-15 ENCOUNTER — Encounter (HOSPITAL_COMMUNITY)
Admission: EM | Disposition: A | Discharge status: Home / Self Care | Payer: Self-pay | Source: Home / Self Care | Attending: Internal Medicine

## 2014-09-15 DIAGNOSIS — N186 End stage renal disease: Secondary | ICD-10-CM

## 2014-09-15 HISTORY — PX: INSERTION OF DIALYSIS CATHETER: SHX1324

## 2014-09-15 LAB — RENAL FUNCTION PANEL
Albumin: 3.2 g/dL — ABNORMAL LOW (ref 3.5–5.2)
Anion gap: 15 (ref 5–15)
BUN: 22 mg/dL (ref 6–23)
CO2: 20 meq/L (ref 19–32)
CREATININE: 6.73 mg/dL — AB (ref 0.50–1.35)
Calcium: 8.7 mg/dL (ref 8.4–10.5)
Chloride: 105 mEq/L (ref 96–112)
GFR calc Af Amer: 12 mL/min — ABNORMAL LOW (ref >90)
GFR calc non Af Amer: 10 mL/min — ABNORMAL LOW (ref >90)
GLUCOSE: 110 mg/dL — AB (ref 70–99)
POTASSIUM: 5.4 meq/L — AB (ref 3.7–5.3)
Phosphorus: 1.5 mg/dL — ABNORMAL LOW (ref 2.3–4.6)
Sodium: 140 mEq/L (ref 137–147)

## 2014-09-15 LAB — CBC
HEMATOCRIT: 24.2 % — AB (ref 39.0–52.0)
Hemoglobin: 7.8 g/dL — ABNORMAL LOW (ref 13.0–17.0)
MCH: 33.6 pg (ref 26.0–34.0)
MCHC: 32.2 g/dL (ref 30.0–36.0)
MCV: 104.3 fL — AB (ref 78.0–100.0)
Platelets: 482 10*3/uL — ABNORMAL HIGH (ref 150–400)
RBC: 2.32 MIL/uL — AB (ref 4.22–5.81)
RDW: 15 % (ref 11.5–15.5)
WBC: 8.1 10*3/uL (ref 4.0–10.5)

## 2014-09-15 LAB — CULTURE, BLOOD (ROUTINE X 2)
CULTURE: NO GROWTH
Culture: NO GROWTH

## 2014-09-15 LAB — VANCOMYCIN, RANDOM

## 2014-09-15 LAB — POCT I-STAT 4, (NA,K, GLUC, HGB,HCT)
Glucose, Bld: 93 mg/dL (ref 70–99)
HCT: 25 % — ABNORMAL LOW (ref 39.0–52.0)
Hemoglobin: 8.5 g/dL — ABNORMAL LOW (ref 13.0–17.0)
POTASSIUM: 4.8 meq/L (ref 3.7–5.3)
Sodium: 140 mEq/L (ref 137–147)

## 2014-09-15 LAB — VANCOMYCIN, TROUGH: Vancomycin Tr: <5 ug/mL — ABNORMAL LOW (ref 10.0–20.0)

## 2014-09-15 SURGERY — INSERTION OF DIALYSIS CATHETER
Anesthesia: General | Site: Neck | Laterality: Left

## 2014-09-15 MED ORDER — GLYCOPYRROLATE 0.2 MG/ML IJ SOLN
INTRAMUSCULAR | Status: AC
  Filled 2014-09-15: qty 3

## 2014-09-15 MED ORDER — VANCOMYCIN HCL IN DEXTROSE 1-5 GM/200ML-% IV SOLN: 1000.0000 mg | INTRAVENOUS | Status: DC

## 2014-09-15 MED ORDER — ARTIFICIAL TEARS OP OINT
TOPICAL_OINTMENT | OPHTHALMIC | Status: AC
  Filled 2014-09-15: qty 3.5

## 2014-09-15 MED ORDER — VANCOMYCIN HCL IN DEXTROSE 1-5 GM/200ML-% IV SOLN
1000.0000 mg | INTRAVENOUS | Status: DC
  Administered 2014-09-15: 1000 mg via INTRAVENOUS
  Filled 2014-09-15 (×2): qty 200

## 2014-09-15 MED ORDER — NEOSTIGMINE METHYLSULFATE 10 MG/10ML IV SOLN
INTRAVENOUS | Status: AC
  Filled 2014-09-15: qty 1

## 2014-09-15 MED ORDER — HEPARIN SODIUM (PORCINE) 1000 UNIT/ML IJ SOLN
INTRAMUSCULAR | Status: AC
  Filled 2014-09-15: qty 1

## 2014-09-15 MED ORDER — SODIUM CHLORIDE 0.9 % IV SOLN
INTRAVENOUS | Status: DC | PRN
  Administered 2014-09-15: 08:00:00 via INTRAVENOUS

## 2014-09-15 MED ORDER — OXYCODONE HCL 5 MG PO TABS: 5.0000 mg | ORAL_TABLET | ORAL | Status: DC | PRN

## 2014-09-15 MED ORDER — ONDANSETRON HCL 4 MG/2ML IJ SOLN
INTRAMUSCULAR | Status: DC | PRN
  Administered 2014-09-15: 4 mg via INTRAVENOUS

## 2014-09-15 MED ORDER — FENTANYL CITRATE 0.05 MG/ML IJ SOLN
INTRAMUSCULAR | Status: AC
  Filled 2014-09-15: qty 5

## 2014-09-15 MED ORDER — FENTANYL CITRATE 0.05 MG/ML IJ SOLN
INTRAMUSCULAR | Status: DC | PRN
  Administered 2014-09-15 (×3): 50 ug via INTRAVENOUS

## 2014-09-15 MED ORDER — LIDOCAINE-EPINEPHRINE (PF) 1 %-1:200000 IJ SOLN
INTRAMUSCULAR | Status: DC | PRN
  Administered 2014-09-15: 20 mL

## 2014-09-15 MED ORDER — FENTANYL CITRATE 0.05 MG/ML IJ SOLN: 25.0000 ug | INTRAMUSCULAR | Status: DC | PRN

## 2014-09-15 MED ORDER — DEXAMETHASONE SODIUM PHOSPHATE 4 MG/ML IJ SOLN
INTRAMUSCULAR | Status: DC | PRN
  Administered 2014-09-15: 4 mg via INTRAVENOUS

## 2014-09-15 MED ORDER — MIDAZOLAM HCL 2 MG/2ML IJ SOLN
INTRAMUSCULAR | Status: AC
  Filled 2014-09-15: qty 2

## 2014-09-15 MED ORDER — PROPOFOL 10 MG/ML IV BOLUS
INTRAVENOUS | Status: AC
  Filled 2014-09-15: qty 20

## 2014-09-15 MED ORDER — PROPOFOL 10 MG/ML IV BOLUS
INTRAVENOUS | Status: DC | PRN
  Administered 2014-09-15: 150 mg via INTRAVENOUS

## 2014-09-15 MED ORDER — DEXAMETHASONE SODIUM PHOSPHATE 4 MG/ML IJ SOLN
INTRAMUSCULAR | Status: AC
  Filled 2014-09-15: qty 1

## 2014-09-15 MED ORDER — ALTEPLASE 2 MG IJ SOLR
5.0000 mg | INTRAMUSCULAR | Status: AC
Start: 2014-09-15 — End: 2014-09-15
  Administered 2014-09-15: 5 mg
  Filled 2014-09-15 (×2): qty 6

## 2014-09-15 MED ORDER — OXYCODONE HCL 5 MG PO TABS
5.0000 mg | ORAL_TABLET | Freq: Four times a day (QID) | ORAL | Status: DC | PRN
Start: 2014-09-15 — End: 2014-09-15

## 2014-09-15 MED ORDER — SODIUM CHLORIDE 0.9 % IJ SOLN
INTRAMUSCULAR | Status: AC
  Filled 2014-09-15: qty 10

## 2014-09-15 MED ORDER — OXYCODONE HCL 5 MG PO TABS
5.0000 mg | ORAL_TABLET | Freq: Once | ORAL | Status: AC | PRN
  Administered 2014-09-15: 5 mg via ORAL

## 2014-09-15 MED ORDER — DEXTROSE 5 % IV SOLN
INTRAVENOUS | Status: AC
  Administered 2014-09-15: 1.5 g via INTRAVENOUS
  Filled 2014-09-15: qty 1.5

## 2014-09-15 MED ORDER — LIDOCAINE-EPINEPHRINE 1 %-1:100000 IJ SOLN
INTRAMUSCULAR | Status: AC
  Filled 2014-09-15: qty 1

## 2014-09-15 MED ORDER — ONDANSETRON HCL 4 MG/2ML IJ SOLN
INTRAMUSCULAR | Status: AC
  Filled 2014-09-15: qty 2

## 2014-09-15 MED ORDER — LIDOCAINE HCL (CARDIAC) 20 MG/ML IV SOLN
INTRAVENOUS | Status: AC
  Filled 2014-09-15: qty 5

## 2014-09-15 MED ORDER — OXYCODONE HCL 5 MG/5ML PO SOLN: 5.0000 mg | Freq: Once | ORAL | Status: AC | PRN

## 2014-09-15 MED ORDER — MIDAZOLAM HCL 5 MG/5ML IJ SOLN
INTRAMUSCULAR | Status: DC | PRN
  Administered 2014-09-15: 1 mg via INTRAVENOUS

## 2014-09-15 MED ORDER — LIDOCAINE HCL (CARDIAC) 20 MG/ML IV SOLN
INTRAVENOUS | Status: DC | PRN
  Administered 2014-09-15: 80 mg via INTRAVENOUS

## 2014-09-15 MED ORDER — SUCCINYLCHOLINE CHLORIDE 20 MG/ML IJ SOLN
INTRAMUSCULAR | Status: AC
  Filled 2014-09-15: qty 1

## 2014-09-15 MED ORDER — HEPARIN SODIUM (PORCINE) 1000 UNIT/ML IJ SOLN
INTRAMUSCULAR | Status: DC | PRN
  Administered 2014-09-15: 4.6 mL via INTRAVENOUS

## 2014-09-15 MED ORDER — ROCURONIUM BROMIDE 50 MG/5ML IV SOLN
INTRAVENOUS | Status: AC
  Filled 2014-09-15: qty 1

## 2014-09-15 MED ORDER — OXYCODONE HCL 5 MG PO TABS
ORAL_TABLET | ORAL | Status: AC
  Administered 2014-09-15: 10:00:00
  Filled 2014-09-15: qty 1

## 2014-09-15 MED ORDER — PROMETHAZINE HCL 25 MG/ML IJ SOLN: 6.2500 mg | INTRAMUSCULAR | Status: DC | PRN

## 2014-09-15 MED ORDER — EPHEDRINE SULFATE 50 MG/ML IJ SOLN
INTRAMUSCULAR | Status: AC
  Filled 2014-09-15: qty 1

## 2014-09-15 MED ORDER — SODIUM CHLORIDE 0.9 % IR SOLN
Status: DC | PRN
  Administered 2014-09-15: 08:00:00

## 2014-09-15 SURGICAL SUPPLY — 36 items
BAG DECANTER FOR FLEXI CONT (MISCELLANEOUS) ×3 IMPLANT
CATH CANNON HEMO 15F 50CM (CATHETERS) IMPLANT
CATH CANNON HEMO 15FR 23CM (HEMODIALYSIS SUPPLIES) ×3 IMPLANT
CATH CANNON HEMO 15FR 31CM (HEMODIALYSIS SUPPLIES) IMPLANT
CATH CANNON HEMO 15FR 32CM (HEMODIALYSIS SUPPLIES) IMPLANT
COVER PROBE W GEL 5X96 (DRAPES) ×3 IMPLANT
COVER SURGICAL LIGHT HANDLE (MISCELLANEOUS) ×3 IMPLANT
DRAPE C-ARM 42X72 X-RAY (DRAPES) ×3 IMPLANT
DRAPE CHEST BREAST 15X10 FENES (DRAPES) ×3 IMPLANT
GAUZE SPONGE 2X2 8PLY STRL LF (GAUZE/BANDAGES/DRESSINGS) ×1 IMPLANT
GAUZE SPONGE 4X4 16PLY XRAY LF (GAUZE/BANDAGES/DRESSINGS) ×3 IMPLANT
GLOVE BIOGEL PI IND STRL 7.5 (GLOVE) ×2 IMPLANT
GLOVE BIOGEL PI INDICATOR 7.5 (GLOVE) ×4
GLOVE SURG SS PI 7.5 STRL IVOR (GLOVE) ×3 IMPLANT
GOWN STRL REUS W/ TWL LRG LVL3 (GOWN DISPOSABLE) ×1 IMPLANT
GOWN STRL REUS W/ TWL XL LVL3 (GOWN DISPOSABLE) ×1 IMPLANT
GOWN STRL REUS W/TWL LRG LVL3 (GOWN DISPOSABLE) ×2
GOWN STRL REUS W/TWL XL LVL3 (GOWN DISPOSABLE) ×2
KIT BASIN OR (CUSTOM PROCEDURE TRAY) ×3 IMPLANT
KIT ROOM TURNOVER OR (KITS) ×3 IMPLANT
LIQUID BAND (GAUZE/BANDAGES/DRESSINGS) ×3 IMPLANT
NEEDLE 18GX1X1/2 (RX/OR ONLY) (NEEDLE) ×3 IMPLANT
NEEDLE HYPO 25GX1X1/2 BEV (NEEDLE) ×3 IMPLANT
NS IRRIG 1000ML POUR BTL (IV SOLUTION) ×3 IMPLANT
PACK SURGICAL SETUP 50X90 (CUSTOM PROCEDURE TRAY) ×3 IMPLANT
PAD ARMBOARD 7.5X6 YLW CONV (MISCELLANEOUS) ×6 IMPLANT
SOAP 2 % CHG 4 OZ (WOUND CARE) ×3 IMPLANT
SPONGE GAUZE 2X2 STER 10/PKG (GAUZE/BANDAGES/DRESSINGS) ×2
SUT ETHILON 3 0 PS 1 (SUTURE) ×3 IMPLANT
SUT VICRYL 4-0 PS2 18IN ABS (SUTURE) ×3 IMPLANT
SYR 20CC LL (SYRINGE) ×9 IMPLANT
SYR 5ML LL (SYRINGE) ×3 IMPLANT
SYR CONTROL 10ML LL (SYRINGE) ×6 IMPLANT
SYRINGE 10CC LL (SYRINGE) ×3 IMPLANT
TOWEL OR 17X24 6PK STRL BLUE (TOWEL DISPOSABLE) ×3 IMPLANT
TOWEL OR 17X26 10 PK STRL BLUE (TOWEL DISPOSABLE) ×3 IMPLANT

## 2014-09-15 NOTE — Anesthesia Preprocedure Evaluation (Addendum)
Anesthesia Evaluation  Patient identified by MRN, date of birth, ID band Patient awake    Reviewed: Allergy & Precautions, H&P , NPO status , Patient's Chart, lab work & pertinent test results  History of Anesthesia Complications Negative for: history of anesthetic complications  Airway Mallampati: II  TM Distance: >3 FB Neck ROM: Full    Dental  (+) Poor Dentition, Dental Advisory Given   Pulmonary asthma , Current Smoker,    Pulmonary exam normal       Cardiovascular Rhythm:Regular Rate:Normal  TEE 09/14/14 Mildly dilated LV with EF estimated 45%, diffuse mild hypokinesis.   Neuro/Psych    GI/Hepatic   Endo/Other    Renal/GU ESRF and DialysisRenal disease     Musculoskeletal   Abdominal   Peds  Hematology  (+) anemia , HIV   Anesthesia Other Findings   Reproductive/Obstetrics                         Anesthesia Physical Anesthesia Plan  ASA: III  Anesthesia Plan: General   Post-op Pain Management:    Induction: Intravenous  Airway Management Planned: LMA  Additional Equipment:   Intra-op Plan:   Post-operative Plan: Extubation in OR  Informed Consent: I have reviewed the patients History and Physical, chart, labs and discussed the procedure including the risks, benefits and alternatives for the proposed anesthesia with the patient or authorized representative who has indicated his/her understanding and acceptance.   Dental advisory given  Plan Discussed with: CRNA and Surgeon  Anesthesia Plan Comments:         Anesthesia Quick Evaluation

## 2014-09-15 NOTE — Progress Notes (Signed)
Pt HD catheter L chest placed today. Not tolerating tx. Arterial and venous pressures too high after heparin, changed system and several rinses. MD aware. Ordered Cathflo Activase to dwell overnight. Pt. Will be discharged and return to home HD unit in [place], [place] tomorrow for scheduled HD tx.

## 2014-09-15 NOTE — H&P (View-Only) (Signed)
[facility] NOTE   MRN : [id number]  Reason for Consult: ESRD Referring Physician: Dr. Briant Cedar  History of Present Illness: 23 year old male who  has a past medical history of HIV (human immunodeficiency virus infection) and Renal disorder.  He is currently on dialysis T-TH-Sat.  He was admitted with fever of 103 and blood cultures were positive for MRSA bacteremia and the patient was appropriately treated with antimicrobials Vanc and Ancef.   His tunnel catheter was removed by radiology and sent for culture 09/12/2014.   We have been asked to provide a a diatek for dialysis access ASAP.  He denies DM, hypercholesterolemia or hypertension.      Current Facility-Administered Medications  Medication Dose Route Frequency Provider Last Rate Last Dose  . 0.9 %  sodium chloride infusion  100 mL Intravenous PRN Manpreet Lucio Edward, MD      . 0.9 %  sodium chloride infusion  100 mL Intravenous PRN Manpreet Lucio Edward, MD      . acetaminophen (TYLENOL) tablet 650 mg  650 mg Oral Q6H PRN Meredeth Ide, MD   650 mg at 09/09/14 1559   Or  . acetaminophen (TYLENOL) suppository 650 mg  650 mg Rectal Q6H PRN Meredeth Ide, MD      . emtricitabine (EMTRIVA) capsule 200 mg  200 mg Oral Once per day on Mon Fri Meredeth Ide, MD   200 mg at 09/11/14 1215  . epoetin alfa (EPOGEN,PROCRIT) injection 10,000 Units  10,000 Units Intravenous Q T,Th,Sa-HD Jamse Mead, MD   10,000 Units at 09/10/14 1300  . etravirine (INTELENCE) tablet 200 mg  200 mg Oral BID WC Catarina Hartshorn, MD   200 mg at 09/13/14 2158  . feeding supplement (NEPRO CARB STEADY) liquid 237 mL  237 mL Oral PRN Manpreet Lucio Edward, MD      . heparin injection 1,300 Units  20 Units/kg Dialysis PRN Randa Lynn, MD   1,300 Units at 09/10/14 1324  . heparin injection 3,800 Units  3,800 Units Dialysis PRN Jamse Mead, MD   3,800 Units at 09/10/14 1545  . heparin injection 5,000 Units  5,000 Units  Subcutaneous 3 times per day Meredeth Ide, MD   5,000 Units at 09/08/14 2335  . lidocaine (PF) (XYLOCAINE) 1 % injection 5 mL  5 mL Intradermal PRN Manpreet Lucio Edward, MD      . lidocaine-prilocaine (EMLA) cream 1 application  1 application Topical PRN Manpreet Lucio Edward, MD      . ondansetron (ZOFRAN) tablet 4 mg  4 mg Oral Q6H PRN Meredeth Ide, MD       Or  . ondansetron (ZOFRAN) injection 4 mg  4 mg Intravenous Q6H PRN Meredeth Ide, MD      . pentafluoroprop-tetrafluoroeth (GEBAUERS) aerosol 1 application  1 application Topical PRN Manpreet Lucio Edward, MD      . raltegravir (ISENTRESS) tablet 400 mg  400 mg Oral BID Meredeth Ide, MD   400 mg at 09/13/14 2158  . sodium chloride 0.9 % injection 10 mL  10 mL Intravenous PRN Jamse Mead, MD   10 mL at 09/10/14 1530  . sodium chloride 0.9 % injection 10 mL  10 mL Intravenous PRN Jamse Mead, MD   10 mL at 09/10/14 1530  . tenofovir (VIREAD) tablet 300 mg  300 mg Oral Q Mon Meredeth Ide, MD      . vancomycin [facility]) IVPB  750 mg/150 ml premix  750 mg Intravenous Q T,Th,Sa-HD [facility], RPH   750 mg at 09/12/14 1057    Pt meds include: Statin :No Betablocker: No ASA: No Other anticoagulants/antiplatelets: none  Past Medical History  Diagnosis Date  . HIV (human immunodeficiency virus infection)   . ESRD (end stage renal disease)     Past Surgical History  Procedure Laterality Date  . Port a cath revision      Social History History  Substance Use Topics  . Smoking status: Current Every Day Smoker -- 1.00 packs/day for 11 years    Types: Cigarettes  . Smokeless tobacco: Never Used  . Alcohol Use: No    Family History Family History  Problem Relation Age of Onset  . Seizures Father     Allergies  Allergen Reactions  . Aspirin Other (See Comments)    Reaction is unknown     REVIEW OF SYSTEMS  General: [ ]  Weight loss, [x ] Fever, [ ]  chills Neurologic: [ ]  Dizziness, [ ]  Blackouts, [ ]   Seizure [ ]  Stroke, [ ]  "Mini stroke", [ ]  Slurred speech, [ ]  Temporary blindness; [ ]  weakness in arms or legs, [ ]  Hoarseness [ ]  Dysphagia Cardiac: [ ]  Chest pain/pressure, [ ]  Shortness of breath at rest [ ]  Shortness of breath with exertion, [ ]  Atrial fibrillation or irregular heartbeat  Vascular: [ ]  Pain in legs with walking, [ ]  Pain in legs at rest, [ ]  Pain in legs at night,  [ ]  Non-healing ulcer, [ ]  Blood clot in vein/DVT,   Pulmonary: [ ]  Home oxygen, [ ]  Productive cough, [ ]  Coughing up blood, [ ]  Asthma,  [ ]  Wheezing [ ]  COPD Musculoskeletal:  [ ]  Arthritis, [ ]  Low back pain, [ ]  Joint pain Hematologic: [ ]  Easy Bruising, [ ]  Anemia; [ ]  Hepatitis Gastrointestinal: [ ]  Blood in stool, [ ]  Gastroesophageal Reflux/heartburn, Urinary: [ ]  chronic Kidney disease, [x ] on HD - [ ]  MWF or [ x] TTHS, [ ]  Burning with urination, [ ]  Difficulty urinating Skin: [ ]  Rashes, [ ]  Wounds Psychological: [ ]  Anxiety, [ ]  Depression  Physical Examination Filed Vitals:   09/13/14 1741 09/13/14 2003 09/14/14 0526 09/14/14 1000  BP: 135/75 126/73 108/61 122/78  Pulse: 95 88 89 94  Temp: 97 F (36.1 C) 98 F (36.7 C) 98.2 F (36.8 C) 98.1 F (36.7 C)  TempSrc: Oral   Oral  Resp: 18 18 19 20   Height:      Weight:  135 lb (61.236 kg)    SpO2: 96% 100% 99% 100%   Body mass index is 22.47 kg/(m^2).  General:  WDWN in NAD Gait: Normal HENT: WNL Eyes: Pupils equal Pulmonary: normal non-labored breathing , without rales, rhonchi,  wheezing Cardiac: RRR, without  murmurs, rubs or gallops; Abdomen: soft, NT, no masses Skin: no rashes, ulcers noted;  no gangrene , no cellulitis; no open wounds;  Vascular Exam/Pulses: 2+ radial pulses and 2+ dorsalis pedis pulses bilaterally Musculoskeletal: no muscle wasting or atrophy; no edema  Neurologic: A&O X 3; Appropriate Affect; SENSATION: normal; MOTOR FUNCTION: 5/5 SymmetricSpeech is fluent/normal  Significant Diagnostic Studies: CBC Lab  Results  Component Value Date   WBC 6.4 09/12/2014   HGB 7.8* 09/12/2014   HCT 23.5* 09/12/2014   MCV 100.9* 09/12/2014   PLT 260 09/12/2014    BMET    Component Value Date/Time   NA 139 09/12/2014 5364  K 4.2 09/12/2014 0822   CL 105 09/12/2014 0822   CO2 22 09/12/2014 0822   GLUCOSE 126* 09/12/2014 0822   BUN 19 09/12/2014 0822   CREATININE 5.90* 09/12/2014 0822   CALCIUM 8.5 09/12/2014 0822   GFRNONAA 12* 09/12/2014 0822   GFRAA 14* 09/12/2014 0822   Estimated Creatinine Clearance: 16.6 mL/min (by C-G formula based on Cr of 5.9).   ASSESSMENT/PLAN: This is a 23 year old male with ESRD on HD TTS admitted with MRSA bacteremia and sepsis.  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   -His HD catheter was removed on 09/12/14. We will plan for tunneled dialysis catheter placement tomorrow with Dr. Myra GianottiBrabham.    Maris BergerKimberly Trinh, PA-C [facility] [place] Office: [phone] Pager: [phone]   History and exam details as above.  Diatek Dr Myra GianottiBrabham tomorrow.  Fabienne Brunsharles Pria Klosinski, MD [facility] [place] Office: [phone] Pager: [phone]

## 2014-09-15 NOTE — Progress Notes (Signed)
S: no new CO.  Had permcath placed today O:BP 125/82 mmHg  Pulse 87  Temp(Src) 98 F (36.7 C) (Oral)  Resp 16  Ht 5\' 5"  (1.651 m)  Wt 62.596 kg (138 lb)  BMI 22.96 kg/m2  SpO2 94%  Intake/Output Summary (Last 24 hours) at 09/15/14 1050 Last data filed at 09/15/14 0915  Gross per 24 hour  Intake   1090 ml  Output     15 ml  Net   1075 ml   Weight change: 1.361 kg (3 lb) EUM:PNTIR and alert CVS:RRR Resp:clear Abd:+ BS NTND Ext:no edema NEURO:CNI Ox3 no asterixis Lt IJ PC placed  . emtricitabine  200 mg Oral Once per day on Tue Sat  . epoetin alfa  10,000 Units Intravenous Q T,Th,Sa-HD  . etravirine  200 mg Oral BID WC  . heparin subcutaneous  5,000 Units Subcutaneous 3 times per day  . oxyCODONE      . raltegravir  400 mg Oral BID  . tenofovir  300 mg Oral Q Tue-1800  . vancomycin  750 mg Intravenous Q T,Th,Sa-HD   Dg Chest Port 1 View  09/15/2014   CLINICAL DATA:  Chronic renal disease.  HIV-positive  EXAM: PORTABLE CHEST - 1 VIEW  COMPARISON:  September 08, 2014  FINDINGS: The previously noted right-sided central catheter is been removed. There is a new left-sided central catheter with tip at cavoatrial junction. No pneumothorax. Lungs are clear. Heart is upper normal in size with pulmonary vascularity within normal limits. No adenopathy. No pneumothorax. No blastic or lytic bone lesions. Incidental note is made of an exostosis arising from the inferior clavicle near the coracoclavicular junction.  IMPRESSION: No edema or consolidation. Central catheter as described without pneumothorax.   Electronically Signed   By: Bretta Bang M.D.   On: 09/15/2014 10:24   BMET    Component Value Date/Time   NA 140 09/15/2014 0818   K 4.8 09/15/2014 0818   CL 102 09/14/2014 1015   CO2 21 09/14/2014 1015   GLUCOSE 93 09/15/2014 0818   BUN 18 09/14/2014 1015   CREATININE 6.30* 09/14/2014 1015   CALCIUM 8.6 09/14/2014 1015   GFRNONAA 11* 09/14/2014 1015   GFRAA 13* 09/14/2014  1015   CBC    Component Value Date/Time   WBC 6.4 09/12/2014 0823   RBC 2.33* 09/12/2014 0823   HGB 8.5* 09/15/2014 0818   HCT 25.0* 09/15/2014 0818   PLT 260 09/12/2014 0823   MCV 100.9* 09/12/2014 0823   MCH 33.5 09/12/2014 0823   MCHC 33.2 09/12/2014 0823   RDW 14.4 09/12/2014 0823   LYMPHSABS 0.6* 09/08/2014 1553   MONOABS 0.5 09/08/2014 1553   EOSABS 0.0 09/08/2014 1553   BASOSABS 0.0 09/08/2014 1553     Assessment: 1. MRSA bacteremia secondary to HD cath infection 2. HIV 3. ESRD  Followed at [facility] MWF Plan: 1. HD today. 2. DC home on 750mg  and further recs based on level today.  Pharm D has number to his HD unit 3.  ID to determine length of therapy.  He dx in [place] [phone].  Please FAX DC Summary to [phone]  Amun Stemm T

## 2014-09-15 NOTE — Interval H&P Note (Signed)
History and Physical Interval Note:  09/15/2014 7:13 AM  Dave Estrada  has presented today for surgery, with the diagnosis of ESRD  N18.6  The various methods of treatment have been discussed with the patient and family. After consideration of risks, benefits and other options for treatment, the patient has consented to  Procedure(s): INSERTION OF DIALYSIS CATHETER (N/A) as a surgical intervention .  The patient's history has been reviewed, patient examined, no change in status, stable for surgery.  I have reviewed the patient's chart and labs.  Questions were answered to the patient's satisfaction.     Nalaysia Manganiello IV, V. WELLS

## 2014-09-15 NOTE — Progress Notes (Signed)
Pt discharged home with stepmother and father. Discharge instructions given, pt verbalized continuation of dialysis schedule. Pt was given prescriptions. Hemodynamically stable.    Ok Anis, RN 5:24 PM 09/15/2014

## 2014-09-15 NOTE — Progress Notes (Signed)
ANTIBIOTIC CONSULT NOTE - FOLLOW UP  Pharmacy Consult for Vancomycin Indication: MRSA bacteremia  Allergies  Allergen Reactions  . Aspirin Other (See Comments)    Reaction is unknown    Patient Measurements: Height: 5\' 5"  (165.1 cm) Weight: 140 lb 3.4 oz (63.6 kg) IBW/kg (Calculated) : 61.5  Vital Signs: Temp: 98 F (36.7 C) (12/01 1411) Temp Source: Oral (12/01 1411) BP: 116/74 mmHg (12/01 1411) Pulse Rate: 82 (12/01 1411)  Labs:  Recent Labs  09/14/14 1015 09/15/14 0818 09/15/14 1240  WBC  --   --  8.1  HGB  --  8.5* 7.8*  PLT  --   --  482*  CREATININE 6.30*  --  6.73*   Estimated Creatinine Clearance: 14.6 mL/min (by C-G formula based on Cr of 6.73).  Recent Labs  09/15/14 1200  VANCOTROUGH <5.0*  VANCORANDOM <5.0    Assessment:  Day # 8 Vancomycin.    Has had Vanc 1 gm on 09/08/14, then 750 mg IV on 11/26 and 11/28.  Pre-HD vanc level today is < 5.0, below goal.  Goal of Therapy:  pre-HD vancomycin levels 15-25 mcg/ml  Plan:   Increase Vancomycin to 1 gram IV after each HD - TTS.  First dose of new regimen will be given prior to discharge today.  Conveyed above information to Maury Dus, at [facility] [phone]).    Vanc to continue x 6 weeks, thru 10/19/14.  Dennie Fetters, RPh Pager: [phone] 09/15/2014,3:17 PM

## 2014-09-15 NOTE — Op Note (Signed)
Procedure: Ultrasound-guided insertion of Diatek catheter  Preoperative diagnosis: End-stage renal disease  Postoperative diagnosis: Same  Anesthesia: Local with IV sedation  Operative findings: 27 cm Diatek catheter right internal jugular vein  Operative details: After obtaining informed consent, the patient was taken to the operating room. The patient was placed in supine position on the operating room table. After adequate sedation the patient's entire neck and chest were prepped and draped in usual sterile fashion. The patient was placed in Trendelenburg position. Ultrasound was used to identify the patient's left internal jugular vein. This had normal compressibility and respiratory variation. Local anesthesia was infiltrated over the left jugular vein.  Using ultrasound guidance, the left internal jugular vein was successfully cannulated.  A 0.035 J-tipped guidewire was threaded into the left internal jugular vein and into the superior vena cava followed by the inferior vena cava under fluoroscopic guidance.   Next sequential 12 and 14 dilators were placed over the guidewire into the right atrium.  A 16 French dilator with a peel-away sheath was then placed over the guidewire into the right atrium.   The guidewire and dilator were removed. A 27 cm Diatek catheter was then placed through the peel away sheath into the right atrium.  The catheter was then tunneled subcutaneously, cut to length, and the hub attached. The catheter was noted to flush and draw easily. The catheter was inspected under fluoroscopy and found with its tip to be in the right atrium without any kinks throughout its course. The catheter was sutured to the skin with nylon sutures. The neck insertion site was closed with Vicryl stitch. The catheter was then loaded with concentrated Heparin solution. A dry sterile dressing was applied.  The patient tolerated procedure well and there were no complications. Instrument sponge and  needle counts correct in the case. The patient was taken to the recovery room in stable condition. Chest x-ray will be obtained in the recovery room.  Durene Cal, MD [facility] [place] Office: [phone] Pager: [phone]

## 2014-09-15 NOTE — Transfer of Care (Signed)
Immediate Anesthesia Transfer of Care Note  Patient: Dave Estrada  Procedure(s) Performed: Procedure(s): INSERTION OF DIALYSIS CATHETER (Left)  Patient Location: PACU  Anesthesia Type:General  Level of Consciousness: awake, alert , oriented and patient cooperative  Airway & Oxygen Therapy: Patient Spontanous Breathing and Patient connected to nasal cannula oxygen  Post-op Assessment: Report given to PACU RN and Post -op Vital signs reviewed and stable  Post vital signs: Reviewed and stable  Complications: No apparent anesthesia complications

## 2014-09-15 NOTE — Anesthesia Postprocedure Evaluation (Signed)
  Anesthesia Post-op Note  Patient: Dave Estrada  Procedure(s) Performed: Procedure(s): INSERTION OF DIALYSIS CATHETER (Left)  Patient Location: PACU  Anesthesia Type:General  Level of Consciousness: awake, alert  and oriented  Airway and Oxygen Therapy: Patient Spontanous Breathing  Post-op Pain: none  Post-op Assessment: Post-op Vital signs reviewed  Post-op Vital Signs: Reviewed  Last Vitals:  Filed Vitals:   09/15/14 1230  BP: 130/83  Pulse: 92  Temp:   Resp:     Complications: No apparent anesthesia complications

## 2014-09-15 NOTE — Anesthesia Procedure Notes (Signed)
Procedure Name: LMA Insertion Date/Time: 09/15/2014 8:29 AM Performed by: Angelica Pou Pre-anesthesia Checklist: Patient identified, Timeout performed, Emergency Drugs available, Suction available and Patient being monitored Patient Re-evaluated:Patient Re-evaluated prior to inductionOxygen Delivery Method: Circle system utilized Preoxygenation: Pre-oxygenation with 100% oxygen Intubation Type: IV induction Ventilation: Mask ventilation without difficulty LMA: LMA inserted LMA Size: 5.0 Number of attempts: 1 Placement Confirmation: breath sounds checked- equal and bilateral and positive ETCO2 Tube secured with: Tape Dental Injury: Teeth and Oropharynx as per pre-operative assessment

## 2014-09-16 ENCOUNTER — Encounter (HOSPITAL_COMMUNITY): Payer: Self-pay | Admitting: Surgery

## 2014-12-14 ENCOUNTER — Emergency Department (HOSPITAL_COMMUNITY)
Admission: EM | Admit: 2014-12-14 | Discharge: 2014-12-14 | Disposition: A | Discharge status: Home / Self Care | Payer: Medicaid Other | Attending: Emergency Medicine | Admitting: Emergency Medicine

## 2014-12-14 ENCOUNTER — Encounter (HOSPITAL_COMMUNITY): Payer: Self-pay | Admitting: *Deleted

## 2014-12-14 DIAGNOSIS — Z792 Long term (current) use of antibiotics: Secondary | ICD-10-CM | POA: Insufficient documentation

## 2014-12-14 DIAGNOSIS — R197 Diarrhea, unspecified: Secondary | ICD-10-CM | POA: Diagnosis not present

## 2014-12-14 DIAGNOSIS — Z720 Tobacco use: Secondary | ICD-10-CM | POA: Diagnosis not present

## 2014-12-14 DIAGNOSIS — N186 End stage renal disease: Secondary | ICD-10-CM | POA: Insufficient documentation

## 2014-12-14 DIAGNOSIS — N390 Urinary tract infection, site not specified: Secondary | ICD-10-CM | POA: Diagnosis not present

## 2014-12-14 DIAGNOSIS — R531 Weakness: Secondary | ICD-10-CM | POA: Diagnosis not present

## 2014-12-14 DIAGNOSIS — Z79899 Other long term (current) drug therapy: Secondary | ICD-10-CM | POA: Diagnosis not present

## 2014-12-14 DIAGNOSIS — R42 Dizziness and giddiness: Secondary | ICD-10-CM | POA: Insufficient documentation

## 2014-12-14 DIAGNOSIS — Z992 Dependence on renal dialysis: Secondary | ICD-10-CM | POA: Insufficient documentation

## 2014-12-14 DIAGNOSIS — Z21 Asymptomatic human immunodeficiency virus [HIV] infection status: Secondary | ICD-10-CM | POA: Insufficient documentation

## 2014-12-14 DIAGNOSIS — R509 Fever, unspecified: Secondary | ICD-10-CM | POA: Diagnosis present

## 2014-12-14 LAB — CBC WITH DIFFERENTIAL/PLATELET
BASOS ABS: 0 10*3/uL (ref 0.0–0.1)
Basophils Relative: 0 % (ref 0–1)
EOS ABS: 0 10*3/uL (ref 0.0–0.7)
EOS PCT: 0 % (ref 0–5)
HCT: 37.8 % — ABNORMAL LOW (ref 39.0–52.0)
Hemoglobin: 12.4 g/dL — ABNORMAL LOW (ref 13.0–17.0)
LYMPHS PCT: 3 % — AB (ref 12–46)
Lymphs Abs: 0.3 10*3/uL — ABNORMAL LOW (ref 0.7–4.0)
MCH: 34.3 pg — ABNORMAL HIGH (ref 26.0–34.0)
MCHC: 32.8 g/dL (ref 30.0–36.0)
MCV: 104.4 fL — ABNORMAL HIGH (ref 78.0–100.0)
MONO ABS: 0.4 10*3/uL (ref 0.1–1.0)
Monocytes Relative: 4 % (ref 3–12)
NEUTROS ABS: 10.9 10*3/uL — AB (ref 1.7–7.7)
NEUTROS PCT: 93 % — AB (ref 43–77)
PLATELETS: 204 10*3/uL (ref 150–400)
RBC: 3.62 MIL/uL — ABNORMAL LOW (ref 4.22–5.81)
RDW: 14.5 % (ref 11.5–15.5)
WBC: 11.7 10*3/uL — AB (ref 4.0–10.5)

## 2014-12-14 LAB — URINE MICROSCOPIC-ADD ON

## 2014-12-14 LAB — URINALYSIS, ROUTINE W REFLEX MICROSCOPIC
Bilirubin Urine: NEGATIVE
Glucose, UA: NEGATIVE mg/dL
Ketones, ur: NEGATIVE mg/dL
Nitrite: NEGATIVE
Protein, ur: 100 mg/dL — AB
SPECIFIC GRAVITY, URINE: 1.01 (ref 1.005–1.030)
Urobilinogen, UA: 0.2 mg/dL (ref 0.0–1.0)
pH: 6.5 (ref 5.0–8.0)

## 2014-12-14 LAB — COMPREHENSIVE METABOLIC PANEL
ALBUMIN: 4.5 g/dL (ref 3.5–5.2)
ALT: 11 U/L (ref 0–53)
AST: 22 U/L (ref 0–37)
Alkaline Phosphatase: 102 U/L (ref 39–117)
Anion gap: 9 (ref 5–15)
BUN: 56 mg/dL — ABNORMAL HIGH (ref 6–23)
CO2: 20 mmol/L (ref 19–32)
CREATININE: 10.51 mg/dL — AB (ref 0.50–1.35)
Calcium: 8.1 mg/dL — ABNORMAL LOW (ref 8.4–10.5)
Chloride: 102 mmol/L (ref 96–112)
GFR calc Af Amer: 7 mL/min — ABNORMAL LOW (ref >90)
GFR, EST NON AFRICAN AMERICAN: 6 mL/min — AB (ref >90)
Glucose, Bld: 99 mg/dL (ref 70–99)
Potassium: 4.6 mmol/L (ref 3.5–5.1)
SODIUM: 131 mmol/L — AB (ref 135–145)
TOTAL PROTEIN: 8.4 g/dL — AB (ref 6.0–8.3)
Total Bilirubin: 1 mg/dL (ref 0.3–1.2)

## 2014-12-14 MED ORDER — ACETAMINOPHEN 325 MG PO TABS
650.0000 mg | ORAL_TABLET | Freq: Once | ORAL | Status: AC
  Administered 2014-12-14: 650 mg via ORAL
  Filled 2014-12-14: qty 2

## 2014-12-14 MED ORDER — CIPROFLOXACIN HCL 250 MG PO TABS
500.0000 mg | ORAL_TABLET | Freq: Once | ORAL | Status: AC
  Administered 2014-12-14: 500 mg via ORAL
  Filled 2014-12-14: qty 2

## 2014-12-14 MED ORDER — CIPROFLOXACIN HCL 250 MG PO TABS: ORAL_TABLET | ORAL | Status: DC

## 2014-12-14 NOTE — Discharge Instructions (Signed)

## 2014-12-14 NOTE — ED Provider Notes (Addendum)
CSN: [id number]     Arrival date & time 12/14/14  1242 History  This chart was scribed for Rolland Porter, MD by Ronney Lion, ED Scribe. This patient was seen in room APA16A/APA16A and the patient's care was started at 2:28 PM.    Chief Complaint  Patient presents with  . Fever    The history is provided by the patient. No language interpreter was used.   HPI Comments: Dave Estrada is a 24 y.o. male with a history of ESRD and on dialysis for 2 years who presents to the Emergency Department complaining of bilateral hip pain and a fever of 100.7. Patient reports his symptoms started last night, when he initially felt weak and dizzy. He reports nausea, diarrhea, and cough presently. Patient normally urinates about once a day.  He states that he has felt the urge that he needs to urinate almost all day long. States this is unusual for him considering his norm.  He denies urethral discharge, sores bumps or blisters. He was supposed to go to dialysis here but was sent here due to fever. 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 He denies a history of bladder or kidney infection. He denies hematochezia, sore throat, or SOB.   History of perinatal HIV infection. He is compliant with medications per his report.  Past Medical History  Diagnosis Date  . HIV (human immunodeficiency virus infection)   . ESRD (end stage renal disease)    Past Surgical History  Procedure Laterality Date  . Port a cath revision    . Tee without cardioversion N/A 09/14/2014    Procedure: TRANSESOPHAGEAL ECHOCARDIOGRAM (TEE);  Surgeon: Laurey Morale, MD;  Location: [facility] ENDOSCOPY;  Service: Cardiovascular;  Laterality: N/A;  will be at [facility] by mon  . Insertion of dialysis catheter Left 09/15/2014    Procedure: INSERTION OF DIALYSIS CATHETER;  Surgeon: Nada Libman, MD;  Location: [facility] OR;  Service: Vascular;  Laterality: Left;   Family History  Problem Relation Age of Onset  . Seizures  Father    History  Substance Use Topics  . Smoking status: Current Every Day Smoker -- 1.00 packs/day for 11 years    Types: Cigarettes  . Smokeless tobacco: Never Used  . Alcohol Use: No    Review of Systems  Constitutional: Positive for fever. Negative for chills, diaphoresis, appetite change and fatigue.  HENT: Negative for mouth sores, sore throat and trouble swallowing.   Eyes: Negative for visual disturbance.  Respiratory: Positive for cough. Negative for chest tightness, shortness of breath and wheezing.   Cardiovascular: Negative for chest pain.  Gastrointestinal: Positive for nausea and diarrhea. Negative for vomiting, abdominal pain, blood in stool and abdominal distention.  Endocrine: Negative for polydipsia, polyphagia and polyuria.  Genitourinary: Negative for dysuria, frequency and hematuria.  Musculoskeletal: Positive for arthralgias. Negative for gait problem.  Skin: Negative for color change, pallor and rash.  Neurological: Positive for dizziness, weakness and light-headedness. Negative for syncope and headaches.  Hematological: Does not bruise/bleed easily.  Psychiatric/Behavioral: Negative for behavioral problems and confusion.      Allergies  Aspirin  Home Medications   Prior to Admission medications   Medication Sig Start Date End Date Taking? Authorizing Provider  emtricitabine (EMTRIVA) 200 MG capsule Take 200 mg by mouth 2 (two) times a week. Taken after dialysis on Mondays and Fridays   Yes Historical Provider, MD  etravirine (INTELENCE) 100 MG tablet Take 200 mg by  mouth every 12 (twelve) hours.   Yes Historical Provider, MD  raltegravir (ISENTRESS) 400 MG tablet Take 400 mg by mouth 2 (two) times daily.   Yes Historical Provider, MD  tenofovir (VIREAD) 300 MG tablet Take 300 mg by mouth every Monday. To be taken on Mondays after dialysis   Yes Historical Provider, MD  ciprofloxacin (CIPRO) 250 MG tablet 1 po bid 12/14/14   Rolland Porter, MD  oxyCODONE  (OXY IR/ROXICODONE) 5 MG immediate release tablet Take 1 tablet (5 mg total) by mouth every 4 (four) hours as needed for severe pain. Patient not taking: Reported on 12/14/2014 09/15/14   Catarina Hartshorn, MD  traMADol (ULTRAM) 50 MG tablet Take 1 tablet (50 mg total) by mouth every 6 (six) hours as needed for pain. Patient not taking: Reported on 09/08/2014 01/08/13   Donnetta Hutching, MD  vancomycin [facility]) 1 GM/200ML SOLN Inject 200 mLs (1,000 mg total) into the vein Every Tuesday,Thursday,and Saturday with dialysis. Give last dose on dialysis on 10/19/14 Patient not taking: Reported on 12/14/2014 09/15/14   Catarina Hartshorn, MD   BP 108/60 mmHg  Pulse 109  Temp(Src) 100.8 F (38.2 C) (Oral)  Resp 18  Ht 5\' 5"  (1.651 m)  Wt 150 lb (68.04 kg)  BMI 24.96 kg/m2  SpO2 98% Physical Exam  Constitutional: He is oriented to person, place, and time. He appears well-developed and well-nourished. No distress.  HENT:  Head: Normocephalic.  Eyes: Conjunctivae are normal. Pupils are equal, round, and reactive to light. No scleral icterus.  Neck: Normal range of motion. Neck supple. No thyromegaly present.  Cardiovascular: Normal rate and regular rhythm.  Exam reveals no gallop and no friction rub.   No murmur heard. Pulmonary/Chest: Effort normal and breath sounds normal. No respiratory distress. He has no wheezes. He has no rales.  Abdominal: Soft. Bowel sounds are normal. He exhibits no distension. There is no tenderness. There is no rebound.  Musculoskeletal: Normal range of motion. He exhibits tenderness.  Tenderness to bilateral anterior hips.  Neurological: He is alert and oriented to person, place, and time.  Skin: Skin is warm and dry. No rash noted.  Psychiatric: He has a normal mood and affect. His behavior is normal.  Nursing note and vitals reviewed.   ED Course  Procedures (including critical care time)  DIAGNOSTIC STUDIES: Oxygen Saturation is 100% on room air, normal by my interpretation.     COORDINATION OF CARE: 2:32 PM - Discussed treatment plan with pt at bedside which includes Motrin and Tylenol, and pt agreed to plan.   Labs Review Labs Reviewed  CBC WITH DIFFERENTIAL/PLATELET - Abnormal; Notable for the following:    WBC 11.7 (*)    RBC 3.62 (*)    Hemoglobin 12.4 (*)    HCT 37.8 (*)    MCV 104.4 (*)    MCH 34.3 (*)    Neutrophils Relative % 93 (*)    Neutro Abs 10.9 (*)    Lymphocytes Relative 3 (*)    Lymphs Abs 0.3 (*)    All other components within normal limits  COMPREHENSIVE METABOLIC PANEL - Abnormal; Notable for the following:    Sodium 131 (*)    BUN 56 (*)    Creatinine, Ser 10.51 (*)    Calcium 8.1 (*)    Total Protein 8.4 (*)    GFR calc non Af Amer 6 (*)    GFR calc Af Amer 7 (*)    All other components within normal limits  URINALYSIS,  ROUTINE W REFLEX MICROSCOPIC - Abnormal; Notable for the following:    Hgb urine dipstick MODERATE (*)    Protein, ur 100 (*)    Leukocytes, UA TRACE (*)    All other components within normal limits  URINE MICROSCOPIC-ADD ON - Abnormal; Notable for the following:    Bacteria, UA FEW (*)    All other components within normal limits    Imaging Review No results found.   EKG Interpretation None      MDM   Final diagnoses:  UTI (lower urinary tract infection)    Patient's UA  with bacteria and white blood cells. WBC is 11.7. He is not acidotic. He states he "feels fine". Initial urinalysis was lost by lab. Repeat is obtained. He is here over several hours. His heart rate improves after Tylenol down to 87. He is awake alert states he is hungry. Is not hypotensive or tachycardic. No tachypnea. Well oxygenated. No signs of frank sepsis. I think is appropriate for outpatient treatment. Urine culture pending area started on Cipro. Recheck with any worsening symptoms.  I personally performed the services described in this documentation, which was scribed in my presence. The recorded information has been  reviewed and is accurate.     Rolland Porter, MD 12/14/14 1758  Rolland Porter, MD 12/14/14 [phone]

## 2014-12-14 NOTE — ED Notes (Addendum)
Fever, cough, headache, legs hurt. Pt on dialysis.  MWF, Did not go to dialysis today.  Voiding frequently. Hips hurt.

## 2014-12-16 ENCOUNTER — Inpatient Hospital Stay (HOSPITAL_COMMUNITY)
Admission: EM | Admit: 2014-12-16 | Discharge: 2014-12-21 | DRG: 314 | Disposition: A | Discharge status: Home / Self Care | Payer: Medicaid Other | Attending: Internal Medicine | Admitting: Internal Medicine

## 2014-12-16 ENCOUNTER — Encounter (HOSPITAL_COMMUNITY): Payer: Self-pay | Admitting: Emergency Medicine

## 2014-12-16 ENCOUNTER — Emergency Department (HOSPITAL_COMMUNITY): Payer: Medicaid Other

## 2014-12-16 DIAGNOSIS — J45909 Unspecified asthma, uncomplicated: Secondary | ICD-10-CM | POA: Diagnosis present

## 2014-12-16 DIAGNOSIS — N186 End stage renal disease: Secondary | ICD-10-CM | POA: Diagnosis present

## 2014-12-16 DIAGNOSIS — J069 Acute upper respiratory infection, unspecified: Secondary | ICD-10-CM

## 2014-12-16 DIAGNOSIS — B9562 Methicillin resistant Staphylococcus aureus infection as the cause of diseases classified elsewhere: Secondary | ICD-10-CM | POA: Diagnosis present

## 2014-12-16 DIAGNOSIS — Z8614 Personal history of Methicillin resistant Staphylococcus aureus infection: Secondary | ICD-10-CM

## 2014-12-16 DIAGNOSIS — I252 Old myocardial infarction: Secondary | ICD-10-CM

## 2014-12-16 DIAGNOSIS — I770 Arteriovenous fistula, acquired: Secondary | ICD-10-CM | POA: Diagnosis present

## 2014-12-16 DIAGNOSIS — I5023 Acute on chronic systolic (congestive) heart failure: Secondary | ICD-10-CM | POA: Diagnosis present

## 2014-12-16 DIAGNOSIS — I429 Cardiomyopathy, unspecified: Secondary | ICD-10-CM | POA: Diagnosis present

## 2014-12-16 DIAGNOSIS — Z992 Dependence on renal dialysis: Secondary | ICD-10-CM

## 2014-12-16 DIAGNOSIS — F17210 Nicotine dependence, cigarettes, uncomplicated: Secondary | ICD-10-CM | POA: Diagnosis present

## 2014-12-16 DIAGNOSIS — R1110 Vomiting, unspecified: Secondary | ICD-10-CM

## 2014-12-16 DIAGNOSIS — R7881 Bacteremia: Secondary | ICD-10-CM | POA: Diagnosis present

## 2014-12-16 DIAGNOSIS — D696 Thrombocytopenia, unspecified: Secondary | ICD-10-CM | POA: Diagnosis present

## 2014-12-16 DIAGNOSIS — B20 Human immunodeficiency virus [HIV] disease: Secondary | ICD-10-CM | POA: Diagnosis present

## 2014-12-16 DIAGNOSIS — R509 Fever, unspecified: Secondary | ICD-10-CM

## 2014-12-16 DIAGNOSIS — D539 Nutritional anemia, unspecified: Secondary | ICD-10-CM | POA: Diagnosis present

## 2014-12-16 DIAGNOSIS — R197 Diarrhea, unspecified: Secondary | ICD-10-CM

## 2014-12-16 DIAGNOSIS — T827XXA Infection and inflammatory reaction due to other cardiac and vascular devices, implants and grafts, initial encounter: Principal | ICD-10-CM | POA: Diagnosis present

## 2014-12-16 DIAGNOSIS — I330 Acute and subacute infective endocarditis: Secondary | ICD-10-CM | POA: Diagnosis present

## 2014-12-16 DIAGNOSIS — Y828 Other medical devices associated with adverse incidents: Secondary | ICD-10-CM | POA: Diagnosis present

## 2014-12-16 HISTORY — DX: Heart disease, unspecified: I51.9

## 2014-12-16 HISTORY — DX: Arteriovenous fistula, acquired: I77.0

## 2014-12-16 HISTORY — DX: Sepsis due to methicillin susceptible Staphylococcus aureus: A41.01

## 2014-12-16 LAB — COMPREHENSIVE METABOLIC PANEL
ALBUMIN: 3.7 g/dL (ref 3.5–5.2)
ALT: 15 U/L (ref 0–53)
AST: 23 U/L (ref 0–37)
Alkaline Phosphatase: 73 U/L (ref 39–117)
Anion gap: 12 (ref 5–15)
BILIRUBIN TOTAL: 0.7 mg/dL (ref 0.3–1.2)
BUN: 40 mg/dL — ABNORMAL HIGH (ref 6–23)
CO2: 25 mmol/L (ref 19–32)
Calcium: 7.9 mg/dL — ABNORMAL LOW (ref 8.4–10.5)
Chloride: 96 mmol/L (ref 96–112)
Creatinine, Ser: 8.07 mg/dL — ABNORMAL HIGH (ref 0.50–1.35)
GFR calc Af Amer: 10 mL/min — ABNORMAL LOW (ref >90)
GFR calc non Af Amer: 8 mL/min — ABNORMAL LOW (ref >90)
Glucose, Bld: 134 mg/dL — ABNORMAL HIGH (ref 70–99)
POTASSIUM: 3.4 mmol/L — AB (ref 3.5–5.1)
Sodium: 133 mmol/L — ABNORMAL LOW (ref 135–145)
TOTAL PROTEIN: 7.8 g/dL (ref 6.0–8.3)

## 2014-12-16 LAB — CBC WITH DIFFERENTIAL/PLATELET
BASOS ABS: 0 10*3/uL (ref 0.0–0.1)
Basophils Relative: 0 % (ref 0–1)
EOS ABS: 0.2 10*3/uL (ref 0.0–0.7)
Eosinophils Relative: 2 % (ref 0–5)
HCT: 34.1 % — ABNORMAL LOW (ref 39.0–52.0)
HEMOGLOBIN: 11.4 g/dL — AB (ref 13.0–17.0)
Lymphocytes Relative: 5 % — ABNORMAL LOW (ref 12–46)
Lymphs Abs: 0.4 10*3/uL — ABNORMAL LOW (ref 0.7–4.0)
MCH: 33.9 pg (ref 26.0–34.0)
MCHC: 33.4 g/dL (ref 30.0–36.0)
MCV: 101.5 fL — ABNORMAL HIGH (ref 78.0–100.0)
MONO ABS: 0.5 10*3/uL (ref 0.1–1.0)
MONOS PCT: 7 % (ref 3–12)
NEUTROS PCT: 86 % — AB (ref 43–77)
Neutro Abs: 6.6 10*3/uL (ref 1.7–7.7)
Platelets: 157 10*3/uL (ref 150–400)
RBC: 3.36 MIL/uL — ABNORMAL LOW (ref 4.22–5.81)
RDW: 14.5 % (ref 11.5–15.5)
WBC: 7.7 10*3/uL (ref 4.0–10.5)

## 2014-12-16 LAB — TROPONIN I: Troponin I: <0.03 ng/mL (ref <0.031)

## 2014-12-16 MED ORDER — SODIUM CHLORIDE 0.9 % IV BOLUS (SEPSIS)
1000.0000 mL | Freq: Once | INTRAVENOUS | Status: AC
  Administered 2014-12-16: 1000 mL via INTRAVENOUS

## 2014-12-16 NOTE — ED Provider Notes (Signed)
CSN: [id number]     Arrival date & time 12/16/14  2230 History  This chart was scribed for Ward Givens, MD by Bronson Curb, ED Scribe. This patient was seen in room APA07/APA07 and the patient's care was started at 11:09 PM.    Chief Complaint  Patient presents with  . Fever    The history is provided by the patient and a relative. No language interpreter was used.     HPI Comments: Dave Estrada is a 24 y.o. male, with history of ESRD and HIV who presents to the Emergency Department complaining of a fever (triage temp 103 F) onset 3 days ago. Patient notes he began to feel dizzy with associated fever, nausea, vomiting, diarrhea. He also notes cough productive of white sputum, nasal congestion. Paitient states he has not been able to eat for the past 2 days. He reports 3-4 episodes of vomiting and 2-3 episodes of diarrhea (watery stools) daily. Patient states he feels light-headed upon standing. Family notes sick contacts the night before he started feeling ill. Pt has ESRD and gets Dialysis MWF and was sent home when he developed chills today (Wednesday) after 3 hours, but normally does 3.5 hours. Patient was seen in the ED 2 days ago and treated 2 days ago for an UTI. Patient is still taking ABX for this UTI. He also mentions he was taking ABX prior to UTI, and states he is on antibiotics all the time because of his HIV.    Per family, patient has "heart issues" and is requesting a EKG. Patient states he was informed by Cardiologist that he had a ? MI or cardiomyopathy two years ago in 2014 when he was admitted to [facility]. He was on a ventilator and was started on dialysis at that time. However, he does not remember much about this encounter. Family states he had to wear a life vest for awhile after being discharged.  Patient is also complaining of substernal chest pain that is burning and notes anything he eat or drinks makes it worse. He reports GERD symptoms whenever he eats and  notes the food feels as if it moves "slowly" upon swallowing.  He reports burning fluid in throat when lying supine began with onset of symptoms and denies having this before. He further denies rhinorrhea, SOB, leg swelling. Patient still takes Metoprolol and Lisinopril and notes compliance with HIV medications. Patient is a 1/2 ppd smoker and reports history of occasional EtOH consumption.     Cardiologist: Dr. Earna Coder in [place] (owes money so doesn't see anymore).. ID: Dr. Janora Norlander in [facility]  Past Medical History  Diagnosis Date  . HIV (human immunodeficiency virus infection)   . ESRD (end stage renal disease)    Past Surgical History  Procedure Laterality Date  . Port a cath revision    . Tee without cardioversion N/A 09/14/2014    Procedure: TRANSESOPHAGEAL ECHOCARDIOGRAM (TEE);  Surgeon: Laurey Morale, MD;  Location: [facility] ENDOSCOPY;  Service: Cardiovascular;  Laterality: N/A;  will be at [facility] by mon  . Insertion of dialysis catheter Left 09/15/2014    Procedure: INSERTION OF DIALYSIS CATHETER;  Surgeon: Nada Libman, MD;  Location: [facility] OR;  Service: Vascular;  Laterality: Left;   Family History  Problem Relation Age of Onset  . Seizures Father    History  Substance Use Topics  . Smoking status: Current Every Day Smoker -- 1.00 packs/day for 11 years    Types: Cigarettes  . Smokeless tobacco:  Never Used  . Alcohol Use: No  lives with father occasional ETOH Smokes 1/2 ppd  Review of Systems  Constitutional: Positive for fever and chills.  HENT: Positive for congestion. Negative for rhinorrhea.   Respiratory: Positive for cough. Negative for shortness of breath.   Cardiovascular: Positive for chest pain. Negative for leg swelling.  All other systems reviewed and are negative.     Allergies  Other and Aspirin  Home Medications   Prior to Admission medications   Medication Sig Start Date End Date Taking? Authorizing Provider  albuterol (PROVENTIL HFA;VENTOLIN  HFA) 108 (90 BASE) MCG/ACT inhaler Inhale 1-2 puffs into the lungs every 6 (six) hours as needed for wheezing or shortness of breath.   Yes Historical Provider, MD  ciprofloxacin (CIPRO) 250 MG tablet 1 po bid Patient taking differently: Take 250 mg by mouth 2 (two) times daily. 7 day course starting on 12/15/2014 12/14/14  Yes Rolland Porter, MD  emtricitabine (EMTRIVA) 200 MG capsule Take 200 mg by mouth 2 (two) times a week. Taken after dialysis on Mondays and Fridays   Yes Historical Provider, MD  etravirine (INTELENCE) 100 MG tablet Take 200 mg by mouth every 12 (twelve) hours.   Yes Historical Provider, MD  ibuprofen (ADVIL,MOTRIN) 200 MG tablet Take 200 mg by mouth every 6 (six) hours as needed for fever or moderate pain.   Yes Historical Provider, MD  raltegravir (ISENTRESS) 400 MG tablet Take 400 mg by mouth 2 (two) times daily.   Yes Historical Provider, MD  tenofovir (VIREAD) 300 MG tablet Take 300 mg by mouth every Monday. To be taken on Mondays after dialysis   Yes Historical Provider, MD  metoprolol Lisinopril Septra DS after dialysis   Triage Vitals: BP 123/65 mmHg  Temp(Src) 103 F (39.4 C) (Oral)  Pulse 127/Resp 24  Ht  (1.651 m)  Wt 155 lb (70.308 kg)  BMI 25.79 kg/m2  SpO2 95%  Vital signs normal except for fever, tachycardia   Physical Exam  Constitutional: He is oriented to person, place, and time. He appears well-developed and well-nourished.  Non-toxic appearance. He does not appear ill. No distress.  HENT:  Head: Normocephalic and atraumatic.  Right Ear: External ear normal.  Left Ear: External ear normal.  Nose: Nose normal. No mucosal edema or rhinorrhea.  Mouth/Throat: Oropharynx is clear and moist and mucous membranes are normal. No dental abscesses or uvula swelling.  Tongue is dry and coated with white.  Eyes: Conjunctivae and EOM are normal. Pupils are equal, round, and reactive to light.  Neck: Normal range of motion and full passive range of motion  without pain. Neck supple.  Cardiovascular: Regular rhythm and normal heart sounds.  Tachycardia present.  Exam reveals no gallop and no friction rub.   No murmur heard. Pulmonary/Chest: Effort normal and breath sounds normal. No respiratory distress. He has no wheezes. He has no rhonchi. He has no rales. He exhibits no tenderness and no crepitus.    Area of pain noted  Abdominal: Soft. Normal appearance. He exhibits no distension. Bowel sounds are increased. There is no tenderness. There is no rebound and no guarding.  Very active bowel sounds.  Musculoskeletal: Normal range of motion. He exhibits no edema or tenderness.  Moves all extremities well.   Neurological: He is alert and oriented to person, place, and time. He has normal strength. No cranial nerve deficit.  Skin: Skin is warm, dry and intact. No rash noted. No erythema. No pallor.  Psychiatric: He  has a normal mood and affect. His speech is normal and behavior is normal. His mood appears not anxious.  Nursing note and vitals reviewed.   ED Course  Procedures (including critical care time)  Medications  vancomycin (VANCOCIN) IVPB 1000 mg/200 mL premix (not administered)  famotidine (PEPCID) tablet 20 mg (not administered)  simethicone (MYLICON) chewable tablet 80 mg (not administered)  sodium chloride 0.9 % bolus 1,000 mL (1,000 mLs Intravenous New Bag/Given 12/16/14 2344)  piperacillin-tazobactam (ZOSYN) IVPB 3.375 g (0 g Intravenous Stopped 12/17/14 0137)     DIAGNOSTIC STUDIES: Oxygen Saturation is 95% on room air, adequate by my interpretation.    COORDINATION OF CARE: At 23:20  Discussed treatment plan with patient which includes IV fluids, lab tests, CXR and EKG. Patient agrees.   Due to the patient's HIV status and prior history of sepsis from MRSA patient was started on vancomycin and Zosyn after his blood cultures were obtained.  00:51 Dr Onalee Hua, admit to tele, wants flu test and LA to be done.   01:00 Pt given  his test results. His VS are improving. We discussed admission and he is agreeable. States he had a flu shot this fall. He is still complaining of reflux symptoms.  At 0009 Reviewed chart from [facility]. Patient takes Bactrim DS after dialysis. Pt had fever in November 24th and had MRSA bacteremia and received 6 weeks of vancomycin weekly after dialysis. Source was felt to be his SVC port and it was removed. Re-inserted in January 2016. And does have a diagnosis of Cardiomyopathy, MI was not mentioned.  Labs Review Results for orders placed or performed during the hospital encounter of 12/16/14  Culture, blood (routine x 2)  Result Value Ref Range   Specimen Description BLOOD RIGHT ARM    Special Requests BOTTLES DRAWN AEROBIC AND ANAEROBIC 10CC EACH    Culture PENDING    Report Status PENDING   Culture, blood (routine x 2)  Result Value Ref Range   Specimen Description BLOOD RIGHT ANTECUBITAL    Special Requests      BOTTLES DRAWN AEROBIC AND ANAEROBIC AEB 10CC ANA 8CC   Culture PENDING    Report Status PENDING   Comprehensive metabolic panel  Result Value Ref Range   Sodium 133 (L) 135 - 145 mmol/L   Potassium 3.4 (L) 3.5 - 5.1 mmol/L   Chloride 96 96 - 112 mmol/L   CO2 25 19 - 32 mmol/L   Glucose, Bld 134 (H) 70 - 99 mg/dL   BUN 40 (H) 6 - 23 mg/dL   Creatinine, Ser 1.61 (H) 0.50 - 1.35 mg/dL   Calcium 7.9 (L) 8.4 - 10.5 mg/dL   Total Protein 7.8 6.0 - 8.3 g/dL   Albumin 3.7 3.5 - 5.2 g/dL   AST 23 0 - 37 U/L   ALT 15 0 - 53 U/L   Alkaline Phosphatase 73 39 - 117 U/L   Total Bilirubin 0.7 0.3 - 1.2 mg/dL   GFR calc non Af Amer 8 (L) >90 mL/min   GFR calc Af Amer 10 (L) >90 mL/min   Anion gap 12 5 - 15  CBC with Differential  Result Value Ref Range   WBC 7.7 4.0 - 10.5 K/uL   RBC 3.36 (L) 4.22 - 5.81 MIL/uL   Hemoglobin 11.4 (L) 13.0 - 17.0 g/dL   HCT 09.6 (L) 04.5 - 40.9 %   MCV 101.5 (H) 78.0 - 100.0 fL   MCH 33.9 26.0 - 34.0 pg   MCHC  33.4 30.0 - 36.0 g/dL   RDW 93.2  67.1 - 24.5 %   Platelets 157 150 - 400 K/uL   Neutrophils Relative % 86 (H) 43 - 77 %   Neutro Abs 6.6 1.7 - 7.7 K/uL   Lymphocytes Relative 5 (L) 12 - 46 %   Lymphs Abs 0.4 (L) 0.7 - 4.0 K/uL   Monocytes Relative 7 3 - 12 %   Monocytes Absolute 0.5 0.1 - 1.0 K/uL   Eosinophils Relative 2 0 - 5 %   Eosinophils Absolute 0.2 0.0 - 0.7 K/uL   Basophils Relative 0 0 - 1 %   Basophils Absolute 0.0 0.0 - 0.1 K/uL  Urinalysis, Routine w reflex microscopic  Result Value Ref Range   Color, Urine YELLOW YELLOW   APPearance CLEAR CLEAR   Specific Gravity, Urine 1.020 1.005 - 1.030   pH 6.5 5.0 - 8.0   Glucose, UA NEGATIVE NEGATIVE mg/dL   Hgb urine dipstick MODERATE (A) NEGATIVE   Bilirubin Urine SMALL (A) NEGATIVE   Ketones, ur NEGATIVE NEGATIVE mg/dL   Protein, ur >809 (A) NEGATIVE mg/dL   Urobilinogen, UA 0.2 0.0 - 1.0 mg/dL   Nitrite NEGATIVE NEGATIVE   Leukocytes, UA NEGATIVE NEGATIVE  Troponin I  Result Value Ref Range   Troponin I <0.03 <0.031 ng/mL  Urine microscopic-add on  Result Value Ref Range   Squamous Epithelial / LPF FEW (A) RARE   WBC, UA 0-2 <3 WBC/hpf   RBC / HPF 3-6 <3 RBC/hpf   Bacteria, UA MANY (A) RARE  Lactic acid, plasma  Result Value Ref Range   Lactic Acid, Venous 0.8 0.5 - 2.0 mmol/L   Laboratory interpretation all normal except stable anemia, improved WBC (was 11.7 a few days ago), Stable renal insufficiency, mild hypokalemia      Imaging Review Dg Chest 2 View  12/17/2014   CLINICAL DATA:  Acute onset of fever, productive cough and substernal chest pain. Initial encounter.  EXAM: CHEST  2 VIEW  COMPARISON:  Chest radiograph from 09/15/2014  FINDINGS: The lungs are well-aerated and clear. There is no evidence of focal opacification, pleural effusion or pneumothorax.  The heart is normal in size; the mediastinal contour is within normal limits. No acute osseous abnormalities are seen. A left-sided dual-lumen catheter is noted ending within the right  atrium.  IMPRESSION: No acute cardiopulmonary process seen.   Electronically Signed   By: [name] M.D.   On: 12/17/2014 00:21     EKG Interpretation   Date/Time:  Wednesday December 16 2014 23:35:45 EST Ventricular Rate:  127 PR Interval:  121 QRS Duration: 86 QT Interval:  315 QTC Calculation: 458 R Axis:   52 Text Interpretation:  Sinus tachycardia Borderline repolarization  abnormality No old tracing to compare Confirmed by Saher Davee  MD-I, Orson Rho  ([id number]) on 12/16/2014 11:38:27 PM      MDM   patient has HIV, end-stage renal disease on dialysis. Patient has history of MRSA sepsis a few months ago felt to be due to his dialysis access. Patient presents with fever over the past couple days that has been been getting progressively worse. His fever tonight was 103 on presentation. Cultures were obtained and patient was started on broad-spectrum antibiotics.   Final diagnoses:  Fever, unspecified fever cause  HIV (human immunodeficiency virus infection)  Vomiting and diarrhea  URI (upper respiratory infection)  ESRD (end stage renal disease) on dialysis   Plan admission  Devoria Albe, MD, Armando Gang   I  personally performed the services described in this documentation, which was scribed in my presence. The recorded information has been reviewed and considered.  Devoria Albe, MD, FACEP    Ward Givens, MD 12/17/14 0157

## 2014-12-16 NOTE — ED Notes (Signed)
Pt was treated Monday for URI, pt has 103 fever, took Motrin  45 min before check in . Pt has burning in throat

## 2014-12-17 ENCOUNTER — Encounter (HOSPITAL_COMMUNITY): Payer: Self-pay | Admitting: *Deleted

## 2014-12-17 DIAGNOSIS — I252 Old myocardial infarction: Secondary | ICD-10-CM | POA: Diagnosis not present

## 2014-12-17 DIAGNOSIS — T827XXA Infection and inflammatory reaction due to other cardiac and vascular devices, implants and grafts, initial encounter: Secondary | ICD-10-CM | POA: Diagnosis present

## 2014-12-17 DIAGNOSIS — R197 Diarrhea, unspecified: Secondary | ICD-10-CM

## 2014-12-17 DIAGNOSIS — J069 Acute upper respiratory infection, unspecified: Secondary | ICD-10-CM | POA: Diagnosis not present

## 2014-12-17 DIAGNOSIS — Z8614 Personal history of Methicillin resistant Staphylococcus aureus infection: Secondary | ICD-10-CM | POA: Diagnosis not present

## 2014-12-17 DIAGNOSIS — R509 Fever, unspecified: Secondary | ICD-10-CM | POA: Diagnosis present

## 2014-12-17 DIAGNOSIS — Z992 Dependence on renal dialysis: Secondary | ICD-10-CM | POA: Diagnosis not present

## 2014-12-17 DIAGNOSIS — D696 Thrombocytopenia, unspecified: Secondary | ICD-10-CM | POA: Diagnosis present

## 2014-12-17 DIAGNOSIS — N186 End stage renal disease: Secondary | ICD-10-CM | POA: Diagnosis not present

## 2014-12-17 DIAGNOSIS — R1110 Vomiting, unspecified: Secondary | ICD-10-CM | POA: Diagnosis not present

## 2014-12-17 DIAGNOSIS — B9562 Methicillin resistant Staphylococcus aureus infection as the cause of diseases classified elsewhere: Secondary | ICD-10-CM | POA: Diagnosis present

## 2014-12-17 DIAGNOSIS — T8249XA Other complication of vascular dialysis catheter, initial encounter: Secondary | ICD-10-CM | POA: Insufficient documentation

## 2014-12-17 DIAGNOSIS — I429 Cardiomyopathy, unspecified: Secondary | ICD-10-CM | POA: Diagnosis present

## 2014-12-17 DIAGNOSIS — B20 Human immunodeficiency virus [HIV] disease: Secondary | ICD-10-CM | POA: Diagnosis present

## 2014-12-17 DIAGNOSIS — Z21 Asymptomatic human immunodeficiency virus [HIV] infection status: Secondary | ICD-10-CM | POA: Diagnosis not present

## 2014-12-17 DIAGNOSIS — J45909 Unspecified asthma, uncomplicated: Secondary | ICD-10-CM | POA: Diagnosis present

## 2014-12-17 DIAGNOSIS — T8249XS Other complication of vascular dialysis catheter, sequela: Secondary | ICD-10-CM

## 2014-12-17 DIAGNOSIS — F17210 Nicotine dependence, cigarettes, uncomplicated: Secondary | ICD-10-CM | POA: Diagnosis present

## 2014-12-17 DIAGNOSIS — I330 Acute and subacute infective endocarditis: Secondary | ICD-10-CM | POA: Diagnosis present

## 2014-12-17 DIAGNOSIS — Y828 Other medical devices associated with adverse incidents: Secondary | ICD-10-CM | POA: Diagnosis present

## 2014-12-17 DIAGNOSIS — D539 Nutritional anemia, unspecified: Secondary | ICD-10-CM | POA: Diagnosis present

## 2014-12-17 LAB — URINALYSIS, ROUTINE W REFLEX MICROSCOPIC
Glucose, UA: NEGATIVE mg/dL
Ketones, ur: NEGATIVE mg/dL
LEUKOCYTES UA: NEGATIVE
Nitrite: NEGATIVE
Specific Gravity, Urine: 1.02 (ref 1.005–1.030)
UROBILINOGEN UA: 0.2 mg/dL (ref 0.0–1.0)
pH: 6.5 (ref 5.0–8.0)

## 2014-12-17 LAB — BASIC METABOLIC PANEL
Anion gap: 11 (ref 5–15)
BUN: 42 mg/dL — ABNORMAL HIGH (ref 6–23)
CO2: 25 mmol/L (ref 19–32)
CREATININE: 8.34 mg/dL — AB (ref 0.50–1.35)
Calcium: 7.6 mg/dL — ABNORMAL LOW (ref 8.4–10.5)
Chloride: 97 mmol/L (ref 96–112)
GFR, EST AFRICAN AMERICAN: 9 mL/min — AB (ref >90)
GFR, EST NON AFRICAN AMERICAN: 8 mL/min — AB (ref >90)
GLUCOSE: 134 mg/dL — AB (ref 70–99)
Potassium: 3.7 mmol/L (ref 3.5–5.1)
Sodium: 133 mmol/L — ABNORMAL LOW (ref 135–145)

## 2014-12-17 LAB — LACTIC ACID, PLASMA
Lactic Acid, Venous: 0.8 mmol/L (ref 0.5–2.0)
Lactic Acid, Venous: 3 mmol/L (ref 0.5–2.0)

## 2014-12-17 LAB — CBC
HCT: 30.6 % — ABNORMAL LOW (ref 39.0–52.0)
Hemoglobin: 10.1 g/dL — ABNORMAL LOW (ref 13.0–17.0)
MCH: 33.7 pg (ref 26.0–34.0)
MCHC: 33 g/dL (ref 30.0–36.0)
MCV: 102 fL — AB (ref 78.0–100.0)
Platelets: 137 10*3/uL — ABNORMAL LOW (ref 150–400)
RBC: 3 MIL/uL — ABNORMAL LOW (ref 4.22–5.81)
RDW: 14.6 % (ref 11.5–15.5)
WBC: 7.2 10*3/uL (ref 4.0–10.5)

## 2014-12-17 LAB — URINE MICROSCOPIC-ADD ON

## 2014-12-17 LAB — INFLUENZA PANEL BY PCR (TYPE A & B)
H1N1 flu by pcr: NOT DETECTED
INFLBPCR: NEGATIVE
Influenza A By PCR: NEGATIVE

## 2014-12-17 LAB — EXPECTORATED SPUTUM ASSESSMENT W REFEX TO RESP CULTURE

## 2014-12-17 LAB — MRSA PCR SCREENING: MRSA BY PCR: POSITIVE — AB

## 2014-12-17 LAB — CLOSTRIDIUM DIFFICILE BY PCR: CDIFFPCR: NEGATIVE

## 2014-12-17 LAB — EXPECTORATED SPUTUM ASSESSMENT W GRAM STAIN, RFLX TO RESP C

## 2014-12-17 MED ORDER — ONDANSETRON HCL 4 MG PO TABS: 4.0000 mg | ORAL_TABLET | Freq: Four times a day (QID) | ORAL | Status: DC | PRN

## 2014-12-17 MED ORDER — SODIUM CHLORIDE 0.9 % IJ SOLN: 3.0000 mL | INTRAMUSCULAR | Status: DC | PRN

## 2014-12-17 MED ORDER — RALTEGRAVIR POTASSIUM 400 MG PO TABS
400.0000 mg | ORAL_TABLET | Freq: Two times a day (BID) | ORAL | Status: DC
  Administered 2014-12-17 – 2014-12-21 (×9): 400 mg via ORAL
  Filled 2014-12-17 (×13): qty 1

## 2014-12-17 MED ORDER — IBUPROFEN 400 MG PO TABS
200.0000 mg | ORAL_TABLET | Freq: Four times a day (QID) | ORAL | Status: DC | PRN
  Administered 2014-12-17: 200 mg via ORAL
  Filled 2014-12-17: qty 1

## 2014-12-17 MED ORDER — SODIUM CHLORIDE 0.9 % IV SOLN: 250.0000 mL | INTRAVENOUS | Status: DC | PRN

## 2014-12-17 MED ORDER — HEPARIN SODIUM (PORCINE) 5000 UNIT/ML IJ SOLN
5000.0000 [IU] | Freq: Three times a day (TID) | INTRAMUSCULAR | Status: DC
  Filled 2014-12-17: qty 1

## 2014-12-17 MED ORDER — VANCOMYCIN HCL IN DEXTROSE 1-5 GM/200ML-% IV SOLN
1000.0000 mg | INTRAVENOUS | Status: DC
  Administered 2014-12-18 – 2014-12-21 (×2): 1000 mg via INTRAVENOUS
  Filled 2014-12-17 (×2): qty 200

## 2014-12-17 MED ORDER — EMTRICITABINE 200 MG PO CAPS
200.0000 mg | ORAL_CAPSULE | ORAL | Status: DC
  Filled 2014-12-17 (×2): qty 1

## 2014-12-17 MED ORDER — PIPERACILLIN-TAZOBACTAM 3.375 G IVPB 30 MIN
3.3750 g | Freq: Once | INTRAVENOUS | Status: AC
  Administered 2014-12-17: 3.375 g via INTRAVENOUS
  Filled 2014-12-17: qty 50

## 2014-12-17 MED ORDER — MUPIROCIN 2 % EX OINT
1.0000 "application " | TOPICAL_OINTMENT | Freq: Two times a day (BID) | CUTANEOUS | Status: DC
  Administered 2014-12-17 – 2014-12-21 (×9): 1 via NASAL
  Filled 2014-12-17: qty 22

## 2014-12-17 MED ORDER — SODIUM CHLORIDE 0.9 % IJ SOLN
3.0000 mL | Freq: Two times a day (BID) | INTRAMUSCULAR | Status: DC
  Administered 2014-12-17 – 2014-12-21 (×9): 3 mL via INTRAVENOUS

## 2014-12-17 MED ORDER — VANCOMYCIN HCL IN DEXTROSE 1-5 GM/200ML-% IV SOLN
1000.0000 mg | Freq: Once | INTRAVENOUS | Status: AC
  Administered 2014-12-17: 1000 mg via INTRAVENOUS
  Filled 2014-12-17: qty 200

## 2014-12-17 MED ORDER — ONDANSETRON HCL 4 MG/2ML IJ SOLN: 4.0000 mg | Freq: Four times a day (QID) | INTRAMUSCULAR | Status: DC | PRN

## 2014-12-17 MED ORDER — ENOXAPARIN SODIUM 40 MG/0.4ML ~~LOC~~ SOLN: 40.0000 mg | SUBCUTANEOUS | Status: DC

## 2014-12-17 MED ORDER — ALBUTEROL SULFATE (2.5 MG/3ML) 0.083% IN NEBU: 3.0000 mL | INHALATION_SOLUTION | Freq: Four times a day (QID) | RESPIRATORY_TRACT | Status: DC | PRN

## 2014-12-17 MED ORDER — PIPERACILLIN-TAZOBACTAM IN DEX 2-0.25 GM/50ML IV SOLN
2.2500 g | Freq: Three times a day (TID) | INTRAVENOUS | Status: DC
  Administered 2014-12-17 – 2014-12-18 (×4): 2.25 g via INTRAVENOUS
  Filled 2014-12-17: qty 0
  Filled 2014-12-17: qty 50
  Filled 2014-12-17: qty 45
  Filled 2014-12-17 (×3): qty 50
  Filled 2014-12-17: qty 45
  Filled 2014-12-17: qty 50
  Filled 2014-12-17: qty 0
  Filled 2014-12-17 (×4): qty 50

## 2014-12-17 MED ORDER — SODIUM CHLORIDE 0.9 % IJ SOLN
3.0000 mL | Freq: Two times a day (BID) | INTRAMUSCULAR | Status: DC
  Administered 2014-12-17: 3 mL via INTRAVENOUS

## 2014-12-17 MED ORDER — CHLORHEXIDINE GLUCONATE CLOTH 2 % EX PADS
6.0000 | MEDICATED_PAD | Freq: Every day | CUTANEOUS | Status: DC
  Administered 2014-12-18 – 2014-12-21 (×4): 6 via TOPICAL

## 2014-12-17 MED ORDER — TENOFOVIR DISOPROXIL FUMARATE 300 MG PO TABS
300.0000 mg | ORAL_TABLET | ORAL | Status: DC
  Filled 2014-12-17: qty 1

## 2014-12-17 MED ORDER — SIMETHICONE 80 MG PO CHEW
80.0000 mg | CHEWABLE_TABLET | Freq: Once | ORAL | Status: AC
  Administered 2014-12-17: 80 mg via ORAL
  Filled 2014-12-17: qty 1

## 2014-12-17 MED ORDER — FAMOTIDINE 20 MG PO TABS
20.0000 mg | ORAL_TABLET | Freq: Once | ORAL | Status: AC
  Administered 2014-12-17: 20 mg via ORAL
  Filled 2014-12-17: qty 1

## 2014-12-17 MED ORDER — ETRAVIRINE 100 MG PO TABS
200.0000 mg | ORAL_TABLET | Freq: Two times a day (BID) | ORAL | Status: DC
Start: 2014-12-17 — End: 2014-12-21
  Administered 2014-12-17 – 2014-12-21 (×9): 200 mg via ORAL
  Filled 2014-12-17 (×13): qty 2

## 2014-12-17 NOTE — Progress Notes (Signed)
Patients heart rate elevated this morning. 130's to 150's.  Patient also running fever of 102.7.  Motrin given, blood cultures drawn in the ED.  Notified the MD of the elevation in heart rate. Will continue to monitor.

## 2014-12-17 NOTE — Progress Notes (Signed)
UR chart review completed.  

## 2014-12-17 NOTE — Progress Notes (Signed)
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 Other Anaphylaxis  . Aspirin Other (See Comments)    Reaction is unknown    Patient Measurements: Height: 5\' 5"  (165.1 cm) Weight: 153 lb 14.8 oz (69.82 kg) IBW/kg (Calculated) : 61.5  Vital Signs: Temp: 99.1 F (37.3 C) (03/03 0207) Temp Source: Oral (03/03 0207) BP: 109/69 mmHg (03/03 0207) Pulse Rate: 102 (03/03 0207) Intake/Output from previous day:   Intake/Output from this shift:    Labs:  Recent Labs  12/14/14 1344 12/16/14 2256  WBC 11.7* 7.7  HGB 12.4* 11.4*  PLT 204 157  CREATININE 10.51* 8.07*   No results for input(s): VANCOTROUGH, VANCOPEAK, VANCORANDOM, GENTTROUGH, GENTPEAK, GENTRANDOM, TOBRATROUGH, TOBRAPEAK, TOBRARND, AMIKACINPEAK, AMIKACINTROU, AMIKACIN in the last 72 hours.   Microbiology: Recent Results (from the past 720 hour(s))  Culture, blood (routine x 2)     Status: None (Preliminary result)   Collection Time: 12/16/14 11:34 PM  Result Value Ref Range Status   Specimen Description BLOOD RIGHT ARM  Final   Special Requests BOTTLES DRAWN AEROBIC AND ANAEROBIC 10CC EACH  Final   Culture PENDING  Incomplete   Report Status PENDING  Incomplete  Culture, blood (routine x 2)     Status: None (Preliminary result)   Collection Time: 12/16/14 11:34 PM  Result Value Ref Range Status   Specimen Description BLOOD RIGHT ANTECUBITAL  Final   Special Requests   Final    BOTTLES DRAWN AEROBIC AND ANAEROBIC AEB 10CC ANA 8CC   Culture PENDING  Incomplete   Report Status PENDING  Incomplete    Medical History: Past Medical History  Diagnosis Date  . HIV (human immunodeficiency virus infection)   . ESRD (end stage renal disease)     Medications:  Scheduled:  . emtricitabine  200 mg Oral Once per day on Mon Thu  . etravirine  200 mg Oral Q12H  . heparin  subcutaneous  5,000 Units Subcutaneous 3 times per day  . piperacillin-tazobactam (ZOSYN)  IV  2.25 g Intravenous Q8H  . raltegravir  400 mg Oral BID  . sodium chloride  3 mL Intravenous Q12H  . sodium chloride  3 mL Intravenous Q12H  . [START ON 12/21/2014] tenofovir  300 mg Oral Q Mon  . vancomycin  1,000 mg Intravenous Once   Infusions:   PRN: sodium chloride, albuterol, ibuprofen, ondansetron **OR** ondansetron (ZOFRAN) IV, sodium chloride   Assessment: ESRD patient on usual M-W-F dialysis schedule with probable sepsis now.  Patient with hx of multiple infections (HIV +), on 7 day course of Cipro for UTI, previously treated for 6wk vancomycin for catheter related bacteremia end of last year.  Received one dose vancomycin 1gm & Zosyn 3.375gm @ 0100 tonight.  Goal of Therapy:  Will utilize standard dialysis Zosyn schedule of 2.25gm IV q8h, and clinical pharmacist to FU on further vancomycin dosing later in morning.  Plan:  1.  Zosyn 2.25gm IV q8h to start 12noon ( so that daytime doses will be post dialysis) 2.  Follow further indices of infection 3.  Order vancomycin serum levels as deemed necessary  Scarlett Presto 12/17/2014,3:15 AM

## 2014-12-17 NOTE — Progress Notes (Signed)
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  Pt with h/o MRSA bacteremia.  Allergies  Allergen Reactions  . Other Anaphylaxis  . Aspirin Other (See Comments)    Reaction is unknown   Patient Measurements: Height: 5\' 5"  (165.1 cm) Weight: 151 lb 2.4 oz (68.56 kg) IBW/kg (Calculated) : 61.5  Vital Signs: Temp: 100.1 F (37.8 C) (03/03 0818) Temp Source: Oral (03/03 0603) BP: 109/69 mmHg (03/03 0207) Pulse Rate: 105 (03/03 0818) Intake/Output from previous day: 03/02 0701 - 03/03 0700 In: -  Out: 300 [Urine:300] Intake/Output from this shift:    Labs:  Recent Labs  12/14/14 1344 12/16/14 2256 12/17/14 0628  WBC 11.7* 7.7 7.2  HGB 12.4* 11.4* 10.1*  PLT 204 157 137*  CREATININE 10.51* 8.07* 8.34*   No results for input(s): VANCOTROUGH, VANCOPEAK, VANCORANDOM, GENTTROUGH, GENTPEAK, GENTRANDOM, TOBRATROUGH, TOBRAPEAK, TOBRARND, AMIKACINPEAK, AMIKACINTROU, AMIKACIN in the last 72 hours.   Microbiology: Recent Results (from the past 720 hour(s))  Culture, blood (routine x 2)     Status: None (Preliminary result)   Collection Time: 12/16/14 11:34 PM  Result Value Ref Range Status   Specimen Description BLOOD RIGHT ARM  Final   Special Requests BOTTLES DRAWN AEROBIC AND ANAEROBIC 10CC EACH  Final   Culture NO GROWTH 1 DAY  Final   Report Status PENDING  Incomplete  Culture, blood (routine x 2)     Status: None (Preliminary result)   Collection Time: 12/16/14 11:34 PM  Result Value Ref Range Status   Specimen Description BLOOD RIGHT ANTECUBITAL  Final   Special Requests   Final    BOTTLES DRAWN AEROBIC AND ANAEROBIC AEB 10CC ANA 8CC   Culture NO GROWTH 1 DAY  Final   Report Status PENDING  Incomplete  Urine culture     Status: None (Preliminary result)   Collection Time: 12/17/14 12:17 AM  Result Value Ref Range Status   Specimen Description URINE,  CATHETERIZED  Final   Special Requests IMMUNE:COMPROMISED  Final   Colony Count PENDING  Incomplete   Culture PENDING  Incomplete   Report Status PENDING  Incomplete  MRSA PCR Screening     Status: Abnormal   Collection Time: 12/17/14  2:01 AM  Result Value Ref Range Status   MRSA by PCR POSITIVE (A) NEGATIVE Final    Comment:        The GeneXpert MRSA Assay (FDA approved for NASAL specimens only), is one component of a comprehensive MRSA colonization surveillance program. It is not intended to diagnose MRSA infection nor to guide or monitor treatment for MRSA infections. RESULT CALLED TO, READ BACK BY AND VERIFIED WITH: HANDY T AT 0507 ON [id number] BY FORSYTH K   Urine culture     Status: None (Preliminary result)   Collection Time: 12/17/14  5:43 AM  Result Value Ref Range Status   Specimen Description URINE, CLEAN CATCH  Final   Special Requests NONE  Final   Colony Count PENDING  Incomplete   Culture PENDING  Incomplete   Report Status PENDING  Incomplete  Clostridium Difficile by PCR     Status: None   Collection Time: 12/17/14  6:53 AM  Result Value Ref Range Status   C difficile by pcr NEGATIVE NEGATIVE Final   Medical History: Past Medical History  Diagnosis Date  . HIV (human immunodeficiency virus infection)   . ESRD (end stage renal disease)  Medications:  Scheduled:  . [START ON 12/18/2014] Chlorhexidine Gluconate Cloth  6 each Topical Q0600  . [START ON 12/18/2014] emtricitabine  200 mg Oral Once per day on Mon Fri  . etravirine  200 mg Oral Q12H  . heparin subcutaneous  5,000 Units Subcutaneous 3 times per day  . mupirocin ointment  1 application Nasal BID  . piperacillin-tazobactam (ZOSYN)  IV  2.25 g Intravenous Q8H  . raltegravir  400 mg Oral BID  . sodium chloride  3 mL Intravenous Q12H  . sodium chloride  3 mL Intravenous Q12H  . [START ON 12/21/2014] tenofovir  300 mg Oral Q Mon  . [START ON 12/18/2014] vancomycin  1,000 mg Intravenous Q M,W,F-HD    Infusions:   PRN: sodium chloride, albuterol, ibuprofen, ondansetron **OR** ondansetron (ZOFRAN) IV, sodium chloride   Assessment: ESRD patient on usual M-W-F dialysis schedule with probable sepsis now.  Patient with hx of multiple infections (HIV +), on 7 day course of Cipro for UTI, previously treated for 6wk vancomycin for catheter related bacteremia end of last year.  Received one dose vancomycin 1gm & Zosyn 3.375gm @ 0100 tonight.  Previous records indicate pt's Vanc dose was increased from  each HD to  each HD.  Goal of Therapy:  Will utilize standard dialysis Zosyn schedule of 2.25gm IV q8h.  Pre-Hemodialysis Vancomycin level goal range =15-25 mcg/ml  Plan:   Zosyn 2.25gm IV q8h   Vancomycin  IV after each HD (MWF)  Check pre-dialysis level at steady state  Would favor Infectious disease consult  Follow for indices of infection and progress  Valrie Hart A 12/17/2014,12:58 PM

## 2014-12-17 NOTE — Progress Notes (Signed)
CRITICAL VALUE ALERT  Critical value received:  Lactic Acid 3.0  Date of notification:  12/17/14  Time of notification:  0515  Critical value read back:Yes.    Nurse who received alert:  Meyer Russel RN  MD notified (1st page):  Onalee Hua MD  Time of first page:  0515  MD notified (2nd page): Onalee Hua MD  Time of second page: [phone]  Responding MD:  Onalee Hua MD  Time MD responded: [phone]

## 2014-12-17 NOTE — H&P (Addendum)
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  Pt has h/o mrsa bacteremia end of 2015 was at [facility], source was his right ij dialysis catheter which was removed after several neg blood cultures and new ij catheter was placed on the left.  Completed 6 weeks of vanc with dialysis mid jan 16.  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  Fever since Sunday, has been taking the cipro and still spiking fevers up to 104.  Has been having some nausea, some mild diarrhea, but also uri symptoms and cough.  Denies any cp, abd pain, no pain around his catheter or his new shunt.  No rashes, no sick contacts.  He reports his last viral load was undetectable and last cd4 count was over 200.  He does get prophylactic bactrim with his dialysis.  His is very compliant with his medications.  He also during his [place] stay had a TEE which showed residual vegetation from the cath that was removed in the SVC, he has not had any repeat TEE since then.  Denies sob.  No le swelling or edema.  Has been having increase in his urinary frequency.  Pt noted to be coughing frequently during interview.   Review of Systems:  Positive and negative as per HPI otherwise all other systems are negative  Past Medical History: Past Medical History  Diagnosis Date  . HIV (human immunodeficiency virus infection)   . ESRD (end stage renal disease)    Past Surgical History  Procedure Laterality Date  . Port a cath revision    . Tee without cardioversion N/A 09/14/2014    Procedure: TRANSESOPHAGEAL ECHOCARDIOGRAM (TEE);  Surgeon: Laurey Morale, MD;  Location: [facility] ENDOSCOPY;  Service: Cardiovascular;  Laterality:  N/A;  will be at [facility] by mon  . Insertion of dialysis catheter Left 09/15/2014    Procedure: INSERTION OF DIALYSIS CATHETER;  Surgeon: Nada Libman, MD;  Location: [facility] OR;  Service: Vascular;  Laterality: Left;    Medications: Prior to Admission medications   Medication Sig Start Date End Date Taking? Authorizing Provider  albuterol (PROVENTIL HFA;VENTOLIN HFA) 108 (90 BASE) MCG/ACT inhaler Inhale 1-2 puffs into the lungs every 6 (six) hours as needed for wheezing or shortness of breath.   Yes Historical Provider, MD  ciprofloxacin (CIPRO) 250 MG tablet 1 po bid Patient taking differently: Take 250 mg by mouth 2 (two) times daily. 7 day course starting on 12/15/2014 12/14/14  Yes Rolland Porter, MD  emtricitabine (EMTRIVA) 200 MG capsule Take 200 mg by mouth 2 (two) times a week. Taken after dialysis on Mondays and Fridays   Yes Historical Provider, MD  etravirine (INTELENCE) 100 MG tablet Take 200 mg by mouth every 12 (twelve) hours.   Yes Historical Provider, MD  ibuprofen (ADVIL,MOTRIN) 200 MG tablet Take 200 mg by mouth every 6 (six) hours as needed for fever or moderate pain.   Yes Historical Provider, MD  raltegravir (ISENTRESS) 400 MG tablet Take 400 mg by mouth 2 (two) times daily.   Yes Historical Provider, MD  tenofovir (VIREAD) 300 MG tablet Take  300 mg by mouth every Monday. To be taken on Mondays after dialysis   Yes Historical Provider, MD    Allergies:   Allergies  Allergen Reactions  . Other Anaphylaxis  . Aspirin Other (See Comments)    Reaction is unknown    Social History:  reports that he has been smoking Cigarettes.  He has a 11 pack-year smoking history. He has never used smokeless tobacco. He reports that he uses illicit drugs (Marijuana). He reports that he does not drink alcohol.  Family History: Family History  Problem Relation Age of Onset  . Seizures Father     Physical Exam: Filed Vitals:   12/16/14 2233 12/16/14 2329  BP: 123/65 107/69  Pulse:  129   Temp: 103 F (39.4 C) 100.6 F (38.1 C)  TempSrc: Oral Oral  Resp: 24 20  Height:  (1.651 m)   Weight: 70.308 kg (155 lb)   SpO2: 95% 97%   General appearance: alert, cooperative and no distress nontoxic appearing, coughing Head: Normocephalic, without obvious abnormality, atraumatic Eyes: negative Nose: Nares normal. Septum midline. Mucosa normal. No drainage or sinus tenderness. Neck: no JVD and supple, symmetrical, trachea midline left IJ catheter c/d/i no erythema, nonpainful to palp looks good  Lungs: clear to auscultation bilaterally Heart: regular rate and rhythm, S1, S2 normal, no murmur, click, rub or gallop Abdomen: soft, non-tender; bowel sounds normal; no masses,  no organomegaly Extremities: extremities normal, atraumatic, no cyanosis or edema  Shunt to left extremetity with good thrill, no overt signs of infection Pulses: 2+ and symmetric Skin: Skin color, texture, turgor normal. No rashes or lesions Neurologic: Grossly normal   Labs on Admission:   Recent Labs  12/14/14 1344 12/16/14 2256  NA 131* 133*  K 4.6 3.4*  CL 102 96  CO2 20 25  GLUCOSE 99 134*  BUN 56* 40*  CREATININE 10.51* 8.07*  CALCIUM 8.1* 7.9*    Recent Labs  12/14/14 1344 12/16/14 2256  AST 22 23  ALT 11 15  ALKPHOS 102 73  BILITOT 1.0 0.7  PROT 8.4* 7.8  ALBUMIN 4.5 3.7    Recent Labs  12/14/14 1344 12/16/14 2256  WBC 11.7* 7.7  NEUTROABS 10.9* 6.6  HGB 12.4* 11.4*  HCT 37.8* 34.1*  MCV 104.4* 101.5*  PLT 204 157    Recent Labs  12/16/14 2324  TROPONINI <0.03   Radiological Exams on Admission: Dg Chest 2 View  12/17/2014   CLINICAL DATA:  Acute onset of fever, productive cough and substernal chest pain. Initial encounter.  EXAM: CHEST  2 VIEW  COMPARISON:  Chest radiograph from 09/15/2014  FINDINGS: The lungs are well-aerated and clear. There is no evidence of focal opacification, pleural effusion or pneumothorax.  The heart is normal in size; the mediastinal  contour is within normal limits. No acute osseous abnormalities are seen. A left-sided dual-lumen catheter is noted ending within the right atrium.  IMPRESSION: No acute cardiopulmonary process seen.   Electronically Signed   By: [name] M.D.   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  Cover with iv vancomycin and zosyn.  Urine culture, blood cultures, cdiff, stool cultures all have been ordered.  Have also ordered TEE (  may need this arranged at [facility] and/or call ID in am) to revaluate the vegetation he had in his SVC if he continues to be febrile despite broad spectrum abx (completed 6 weeks of iv vancomycin with his HD mid jan i believe).  Repeat blood cultures if temp rises again.  Nontoxic and vitals stable.  Other potential source could be his dialysis catheter on the left (which was placed after several negative blood cultures at [facility]).  Active Problems:   HIV (human immunodeficiency virus infection)- cont home meds   ESRD on hemodialysis-  Thought to be related to his HIV disease,  Obtain nephrology consult.   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 FULL CODE.  Kenyetta Wimbish A 12/17/2014, 1:00 AM  Reviewed some recent [facility] records in care everywhere.  Had cd4 count 216 (increased from previous) and undetectable hiv load on 10/22/14 with recent ID appt there.  Also had left  Brachiocephalic AVF placed by dr Yvette Rack on 10/27/14.

## 2014-12-17 NOTE — Consult Note (Signed)
Reason for Consult: End-stage renal disease Referring Physician: Dr. Tera Mater is an 24 y.o. male.  HPI: He is a patient well as history of HIV infection, end-stage renal disease on maintenance hemodialysis presently came after he was found to have fever during dialysis . Patient was admitted f  admitted to [facility] at the end of December for similar problem. At that  time TEE was done which showed some residual vegetation in SVC taught to be associated  to his dialysis catheter. At that moment is catheter was moved. His blood culture was found to be positive for MRSA. Patient was started on vancomycin and discharged to dialysis unit to be followed as an outpatient. Accordingly the patient has been getting antibiotics until recently. Presently he seems to be feeling better.  Past Medical History  Diagnosis Date  . HIV (human immunodeficiency virus infection)   . ESRD (end stage renal disease)     Past Surgical History  Procedure Laterality Date  . Port a cath revision    . Tee without cardioversion N/A 09/14/2014    Procedure: TRANSESOPHAGEAL ECHOCARDIOGRAM (TEE);  Surgeon: Larey Dresser, MD;  Location: [facility] ENDOSCOPY;  Service: Cardiovascular;  Laterality: N/A;  will be at [facility] by mon  . Insertion of dialysis catheter Left 09/15/2014    Procedure: INSERTION OF DIALYSIS CATHETER;  Surgeon: Serafina Mitchell, MD;  Location: [facility] OR;  Service: Vascular;  Laterality: Left;    Family History  Problem Relation Age of Onset  . Seizures Father     Social History:  reports that he has been smoking Cigarettes.  He has a 11 pack-year smoking history. He has never used smokeless tobacco. He reports that he uses illicit drugs (Marijuana). He reports that he does not drink alcohol.  Allergies:  Allergies  Allergen Reactions  . Other Anaphylaxis  . Aspirin Other (See Comments)    Reaction is unknown    Medications: I have reviewed the patient's current  medications.  Results for orders placed or performed during the hospital encounter of 12/16/14 (from the past 48 hour(s))  Comprehensive metabolic panel     Status: Abnormal   Collection Time: 12/16/14 10:56 PM  Result Value Ref Range   Sodium 133 (L) 135 - 145 mmol/L   Potassium 3.4 (L) 3.5 - 5.1 mmol/L   Chloride 96 96 - 112 mmol/L   CO2 25 19 - 32 mmol/L   Glucose, Bld 134 (H) 70 - 99 mg/dL   BUN 40 (H) 6 - 23 mg/dL   Creatinine, Ser 8.07 (H) 0.50 - 1.35 mg/dL   Calcium 7.9 (L) 8.4 - 10.5 mg/dL   Total Protein 7.8 6.0 - 8.3 g/dL   Albumin 3.7 3.5 - 5.2 g/dL   AST 23 0 - 37 U/L   ALT 15 0 - 53 U/L   Alkaline Phosphatase 73 39 - 117 U/L   Total Bilirubin 0.7 0.3 - 1.2 mg/dL   GFR calc non Af Amer 8 (L) >90 mL/min   GFR calc Af Amer 10 (L) >90 mL/min    Comment: (NOTE) The eGFR has been calculated using the CKD EPI equation. This calculation has not been validated in all clinical situations. eGFR's persistently <90 mL/min signify possible Chronic Kidney Disease.    Anion gap 12 5 - 15  CBC with Differential     Status: Abnormal   Collection Time: 12/16/14 10:56 PM  Result Value Ref Range   WBC 7.7 4.0 - 10.5 K/uL  RBC 3.36 (L) 4.22 - 5.81 MIL/uL   Hemoglobin 11.4 (L) 13.0 - 17.0 g/dL   HCT 34.1 (L) 39.0 - 52.0 %   MCV 101.5 (H) 78.0 - 100.0 fL   MCH 33.9 26.0 - 34.0 pg   MCHC 33.4 30.0 - 36.0 g/dL   RDW 14.5 11.5 - 15.5 %   Platelets 157 150 - 400 K/uL   Neutrophils Relative % 86 (H) 43 - 77 %   Neutro Abs 6.6 1.7 - 7.7 K/uL   Lymphocytes Relative 5 (L) 12 - 46 %   Lymphs Abs 0.4 (L) 0.7 - 4.0 K/uL   Monocytes Relative 7 3 - 12 %   Monocytes Absolute 0.5 0.1 - 1.0 K/uL   Eosinophils Relative 2 0 - 5 %   Eosinophils Absolute 0.2 0.0 - 0.7 K/uL   Basophils Relative 0 0 - 1 %   Basophils Absolute 0.0 0.0 - 0.1 K/uL  Troponin I     Status: None   Collection Time: 12/16/14 11:24 PM  Result Value Ref Range   Troponin I <0.03 <0.031 ng/mL    Comment:        NO  INDICATION OF MYOCARDIAL INJURY.   Culture, blood (routine x 2)     Status: None (Preliminary result)   Collection Time: 12/16/14 11:34 PM  Result Value Ref Range   Specimen Description BLOOD RIGHT ARM    Special Requests BOTTLES DRAWN AEROBIC AND ANAEROBIC 10CC EACH    Culture NO GROWTH 1 DAY    Report Status PENDING   Culture, blood (routine x 2)     Status: None (Preliminary result)   Collection Time: 12/16/14 11:34 PM  Result Value Ref Range   Specimen Description BLOOD RIGHT ANTECUBITAL    Special Requests      BOTTLES DRAWN AEROBIC AND ANAEROBIC AEB 10CC ANA 8CC   Culture NO GROWTH 1 DAY    Report Status PENDING   Urinalysis, Routine w reflex microscopic     Status: Abnormal   Collection Time: 12/17/14 12:17 AM  Result Value Ref Range   Color, Urine YELLOW YELLOW   APPearance CLEAR CLEAR   Specific Gravity, Urine 1.020 1.005 - 1.030   pH 6.5 5.0 - 8.0   Glucose, UA NEGATIVE NEGATIVE mg/dL   Hgb urine dipstick MODERATE (A) NEGATIVE   Bilirubin Urine SMALL (A) NEGATIVE   Ketones, ur NEGATIVE NEGATIVE mg/dL   Protein, ur >300 (A) NEGATIVE mg/dL   Urobilinogen, UA 0.2 0.0 - 1.0 mg/dL   Nitrite NEGATIVE NEGATIVE   Leukocytes, UA NEGATIVE NEGATIVE  Urine culture     Status: None (Preliminary result)   Collection Time: 12/17/14 12:17 AM  Result Value Ref Range   Specimen Description URINE, CATHETERIZED    Special Requests IMMUNE:COMPROMISED    Colony Count PENDING    Culture PENDING    Report Status PENDING   Urine microscopic-add on     Status: Abnormal   Collection Time: 12/17/14 12:17 AM  Result Value Ref Range   Squamous Epithelial / LPF FEW (A) RARE   WBC, UA 0-2 <3 WBC/hpf   RBC / HPF 3-6 <3 RBC/hpf   Bacteria, UA MANY (A) RARE  Lactic acid, plasma     Status: None   Collection Time: 12/17/14  1:01 AM  Result Value Ref Range   Lactic Acid, Venous 0.8 0.5 - 2.0 mmol/L  MRSA PCR Screening     Status: Abnormal   Collection Time: 12/17/14  2:01 AM  Result Value  Ref Range   MRSA by PCR POSITIVE (A) NEGATIVE    Comment:        The GeneXpert MRSA Assay (FDA approved for NASAL specimens only), is one component of a comprehensive MRSA colonization surveillance program. It is not intended to diagnose MRSA infection nor to guide or monitor treatment for MRSA infections. RESULT CALLED TO, READ BACK BY AND VERIFIED WITH: HANDY T AT 0507 ON [id number] BY FORSYTH K   Lactic acid, plasma     Status: Abnormal   Collection Time: 12/17/14  4:08 AM  Result Value Ref Range   Lactic Acid, Venous 3.0 (HH) 0.5 - 2.0 mmol/L    Comment: CRITICAL RESULT CALLED TO, READ BACK BY AND VERIFIED WITH: HANDY,T AT 5:15AM ON 12/17/14 BY FESTERMAN,C   Urine culture     Status: None (Preliminary result)   Collection Time: 12/17/14  5:43 AM  Result Value Ref Range   Specimen Description URINE, CLEAN CATCH    Special Requests NONE    Colony Count PENDING    Culture PENDING    Report Status PENDING   Basic metabolic panel     Status: Abnormal   Collection Time: 12/17/14  6:28 AM  Result Value Ref Range   Sodium 133 (L) 135 - 145 mmol/L   Potassium 3.7 3.5 - 5.1 mmol/L   Chloride 97 96 - 112 mmol/L   CO2 25 19 - 32 mmol/L   Glucose, Bld 134 (H) 70 - 99 mg/dL   BUN 42 (H) 6 - 23 mg/dL   Creatinine, Ser 8.34 (H) 0.50 - 1.35 mg/dL   Calcium 7.6 (L) 8.4 - 10.5 mg/dL   GFR calc non Af Amer 8 (L) >90 mL/min   GFR calc Af Amer 9 (L) >90 mL/min    Comment: (NOTE) The eGFR has been calculated using the CKD EPI equation. This calculation has not been validated in all clinical situations. eGFR's persistently <90 mL/min signify possible Chronic Kidney Disease.    Anion gap 11 5 - 15  CBC     Status: Abnormal   Collection Time: 12/17/14  6:28 AM  Result Value Ref Range   WBC 7.2 4.0 - 10.5 K/uL   RBC 3.00 (L) 4.22 - 5.81 MIL/uL   Hemoglobin 10.1 (L) 13.0 - 17.0 g/dL   HCT 30.6 (L) 39.0 - 52.0 %   MCV 102.0 (H) 78.0 - 100.0 fL   MCH 33.7 26.0 - 34.0 pg   MCHC 33.0 30.0  - 36.0 g/dL   RDW 14.6 11.5 - 15.5 %   Platelets 137 (L) 150 - 400 K/uL  Clostridium Difficile by PCR     Status: None   Collection Time: 12/17/14  6:53 AM  Result Value Ref Range   C difficile by pcr NEGATIVE NEGATIVE    Dg Chest 2 View  12/17/2014   CLINICAL DATA:  Acute onset of fever, productive cough and substernal chest pain. Initial encounter.  EXAM: CHEST  2 VIEW  COMPARISON:  Chest radiograph from 09/15/2014  FINDINGS: The lungs are well-aerated and clear. There is no evidence of focal opacification, pleural effusion or pneumothorax.  The heart is normal in size; the mediastinal contour is within normal limits. No acute osseous abnormalities are seen. A left-sided dual-lumen catheter is noted ending within the right atrium.  IMPRESSION: No acute cardiopulmonary process seen.   Electronically Signed   By: Garald Balding M.D.   On: 12/17/2014 00:21    Review of Systems  Constitutional: Positive for fever  and chills.  Respiratory: Positive for cough and sputum production. Negative for hemoptysis and shortness of breath.   Cardiovascular: Negative for orthopnea and leg swelling.  Gastrointestinal: Negative for nausea, vomiting and abdominal pain.   Blood pressure 109/69, pulse 105, temperature 100.1 F (37.8 C), temperature source Oral, resp. rate 20, height _0  (1.651 m), weight 68.56 kg (151 lb 2.4 oz), SpO2 98 %. Physical Exam  Constitutional: No distress.  Eyes: No scleral icterus.  Neck: No JVD present.  Cardiovascular: Normal rate and regular rhythm.   No murmur heard. Respiratory: No respiratory distress. He has no wheezes. He has no rales.  GI: There is no rebound.  Musculoskeletal: He exhibits no edema.    Assessment/Plan: Problem #1 fever: Patient with history of MRSA infection and has been on vancomycin. Presently he has a new left dialysis catheter and upper arm fistula. Patient presently with low-grade temperature with normal white blood cell count. Problem #2  end-stage renal disease: His status post hemodialysis yesterday. Patient presently does not have any uremic signs and symptoms and his potassium is good. Problem #3 history of HIV infection Problem #4 anemia: His hemoglobin is within our target range Problem #5 metabolic bone disease: His calcium is range but phosphorus is not available. Problem #6 fluid management: Patient does not have any sign of fluid overload. Plan: We'll make arrangement for patient to get dialysis tomorrow. At this moment we need to discuss with ID about his antibiotics coverage in view of MRSA bacteremia during his last admission. We'll check his basic metabolic panel his CBC in the morning.  Tanvi Gatling S 12/17/2014, 10:42 AM

## 2014-12-17 NOTE — Progress Notes (Addendum)
Addendum:  I called the patient's ID physician at [facility], Dr. Janora Norlander this morning regarding the patient's presentation with recurrent fever in the setting of recent vegetation on an HD catheter in the SVC in November 2015.Marland Kitchen He was not coughed a with giving specific recommendations the cause he was obviously unable to examine the patient personally. However, he did agree that waiting until his blood cultures were positive to order the TEE was reasonable.  This afternoon, it was noted that his blood cultures became positive again for gram-positive cocci in clusters. [facility] consult cardiology tomorrow morning for a potential TEE assessment. We'll make the patient nothing by mouth after midnight.      The patient is a 24 year old man with a history of vegetation in the SVC extending into the right atrium, thought to be associated with a vegetation that was originally around the HD catheter in over 2015-treated with removal of the catheter and 6 weeks of IV vancomycin therapy; history of HIV disease; and end-stage renal disease on hemodialysis; who was admitted this morning by Dr. Onalee Hua for recurrent fevers. The patient was briefly seen and examined. His chart, vital signs, and laboratory studies were reviewed. Agree with current management with exceptions/additions below:  -Cardiology will need to be consulted for a TEE if needed. -Will discuss further with a curbside consultation with infectious diseases at [facility]. [facility] order an influenza panel which may yield results quicker than the respiratory panel. -C. difficile PCR negative. -Nephrology consulted.

## 2014-12-18 ENCOUNTER — Encounter (HOSPITAL_COMMUNITY): Payer: Self-pay | Admitting: Internal Medicine

## 2014-12-18 DIAGNOSIS — D6959 Other secondary thrombocytopenia: Secondary | ICD-10-CM

## 2014-12-18 DIAGNOSIS — B9562 Methicillin resistant Staphylococcus aureus infection as the cause of diseases classified elsewhere: Secondary | ICD-10-CM

## 2014-12-18 DIAGNOSIS — R7881 Bacteremia: Secondary | ICD-10-CM

## 2014-12-18 DIAGNOSIS — I429 Cardiomyopathy, unspecified: Secondary | ICD-10-CM

## 2014-12-18 DIAGNOSIS — D696 Thrombocytopenia, unspecified: Secondary | ICD-10-CM | POA: Diagnosis present

## 2014-12-18 DIAGNOSIS — I770 Arteriovenous fistula, acquired: Secondary | ICD-10-CM | POA: Diagnosis present

## 2014-12-18 DIAGNOSIS — Z21 Asymptomatic human immunodeficiency virus [HIV] infection status: Secondary | ICD-10-CM

## 2014-12-18 LAB — BASIC METABOLIC PANEL
ANION GAP: 11 (ref 5–15)
BUN: 49 mg/dL — ABNORMAL HIGH (ref 6–23)
CO2: 24 mmol/L (ref 19–32)
CREATININE: 9.1 mg/dL — AB (ref 0.50–1.35)
Calcium: 8 mg/dL — ABNORMAL LOW (ref 8.4–10.5)
Chloride: 101 mmol/L (ref 96–112)
GFR calc non Af Amer: 7 mL/min — ABNORMAL LOW (ref >90)
GFR, EST AFRICAN AMERICAN: 8 mL/min — AB (ref >90)
Glucose, Bld: 105 mg/dL — ABNORMAL HIGH (ref 70–99)
Potassium: 3.7 mmol/L (ref 3.5–5.1)
Sodium: 136 mmol/L (ref 135–145)

## 2014-12-18 LAB — CBC
HCT: 30.3 % — ABNORMAL LOW (ref 39.0–52.0)
HEMOGLOBIN: 10.3 g/dL — AB (ref 13.0–17.0)
MCH: 34.6 pg — ABNORMAL HIGH (ref 26.0–34.0)
MCHC: 34 g/dL (ref 30.0–36.0)
MCV: 101.7 fL — ABNORMAL HIGH (ref 78.0–100.0)
Platelets: 131 10*3/uL — ABNORMAL LOW (ref 150–400)
RBC: 2.98 MIL/uL — ABNORMAL LOW (ref 4.22–5.81)
RDW: 14.6 % (ref 11.5–15.5)
WBC: 5.9 10*3/uL (ref 4.0–10.5)

## 2014-12-18 LAB — URINE CULTURE
COLONY COUNT: NO GROWTH
CULTURE: NO GROWTH

## 2014-12-18 MED ORDER — NEPRO/CARBSTEADY PO LIQD: 237.0000 mL | ORAL | Status: DC | PRN

## 2014-12-18 MED ORDER — LIDOCAINE HCL (PF) 1 % IJ SOLN: 5.0000 mL | INTRAMUSCULAR | Status: DC | PRN

## 2014-12-18 MED ORDER — ALTEPLASE 2 MG IJ SOLR
2.0000 mg | Freq: Once | INTRAMUSCULAR | Status: AC | PRN
  Filled 2014-12-18: qty 2

## 2014-12-18 MED ORDER — HEPARIN SODIUM (PORCINE) 1000 UNIT/ML DIALYSIS
1000.0000 [IU] | INTRAMUSCULAR | Status: DC | PRN
  Filled 2014-12-18: qty 1

## 2014-12-18 MED ORDER — HEPARIN SODIUM (PORCINE) 1000 UNIT/ML IJ SOLN
INTRAMUSCULAR | Status: AC
  Administered 2014-12-18: 6900 [IU] via INTRAVENOUS_CENTRAL
  Filled 2014-12-18: qty 11

## 2014-12-18 MED ORDER — PENTAFLUOROPROP-TETRAFLUOROETH EX AERO: 1.0000 "application " | INHALATION_SPRAY | CUTANEOUS | Status: DC | PRN

## 2014-12-18 MED ORDER — SODIUM CHLORIDE 0.9 % IV SOLN: 100.0000 mL | INTRAVENOUS | Status: DC | PRN

## 2014-12-18 MED ORDER — HEPARIN SODIUM (PORCINE) 1000 UNIT/ML DIALYSIS
100.0000 [IU]/kg | INTRAMUSCULAR | Status: DC | PRN
  Administered 2014-12-18: 6900 [IU] via INTRAVENOUS_CENTRAL
  Filled 2014-12-18 (×2): qty 7

## 2014-12-18 MED ORDER — LIDOCAINE-PRILOCAINE 2.5-2.5 % EX CREA: 1.0000 "application " | TOPICAL_CREAM | CUTANEOUS | Status: DC | PRN

## 2014-12-18 MED FILL — Piperacillin Sod-Tazobactam Sod For Inj 2.25 GM (2-0.25 GM): INTRAVENOUS | Qty: 2.25 | Status: AC

## 2014-12-18 MED FILL — Dextrose Inj 5%: INTRAVENOUS | Qty: 50 | Status: AC

## 2014-12-18 NOTE — Consult Note (Signed)
Staph aureus bacteremia:  Recent AVF placed.    Has a catheter.  I agree to remove catheter and have a catheter holiday and then replace.    I agree with repeating the TEE again with history of vegetation.    Should stop Zosyn.  Repeat blood cultures in 1-2 days to assure clearance.   Will need prolonged (6 weeks) IV antibiotics and follow up with [facility].    Thanks

## 2014-12-18 NOTE — Procedures (Signed)
   HEMODIALYSIS TREATMENT NOTE:  3.5 hour dialysis completed via left IJ tunneled catheter.  Exit site unremarkable.  Goal met:  3 liters removed; no interruption in ultrafiltration.  All blood reinfused.  Report given to Janace Aris, RN.  Rockwell Alexandria, RN, CDN

## 2014-12-18 NOTE — Progress Notes (Signed)
Subjective: Interval History: has no complaint of nausea or vomiting. Patient states that he is feeling much better. He is still cough with some sputum production. Patient however denies any difficulty in breathing..  Objective: Vital signs in last 24 hours: Temp:  [98.7 F (37.1 C)-98.8 F (37.1 C)] 98.7 F (37.1 C) (03/04 0520) Pulse Rate:  [102-107] 104 (03/04 0520) Resp:  [20] 20 (03/04 0520) BP: (103-124)/(58-74) 103/58 mmHg (03/04 0520) SpO2:  [99 %-100 %] 99 % (03/04 0520) Weight:  [63.504 kg (140 lb)] 63.504 kg (140 lb) (03/04 0500) Weight change: -6.804 kg (-15 lb)  Intake/Output from previous day: 03/03 0701 - 03/04 0700 In: 50 [IV Piggyback:50] Out: -  Intake/Output this shift:    General appearance: alert, cooperative and no distress Resp: clear to auscultation bilaterally Cardio: regular rate and rhythm, S1, S2 normal, no murmur, click, rub or gallop GI: soft, non-tender; bowel sounds normal; no masses,  no organomegaly Extremities: extremities normal, atraumatic, no cyanosis or edema  Lab Results:  Recent Labs  12/17/14 0628 12/18/14 0629  WBC 7.2 5.9  HGB 10.1* 10.3*  HCT 30.6* 30.3*  PLT 137* 131*   BMET:  Recent Labs  12/17/14 0628 12/18/14 0629  NA 133* 136  K 3.7 3.7  CL 97 101  CO2 25 24  GLUCOSE 134* 105*  BUN 42* 49*  CREATININE 8.34* 9.10*  CALCIUM 7.6* 8.0*   No results for input(Estrada): PTH in the last 72 hours. Iron Studies: No results for input(Estrada): IRON, TIBC, TRANSFERRIN, FERRITIN in the last 72 hours.  Studies/Results: Dg Chest 2 View  12/17/2014   CLINICAL DATA:  Acute onset of fever, productive cough and substernal chest pain. Initial encounter.  EXAM: CHEST  2 VIEW  COMPARISON:  Chest radiograph from 09/15/2014  FINDINGS: The lungs are well-aerated and clear. There is no evidence of focal opacification, pleural effusion or pneumothorax.  The heart is normal in size; the mediastinal contour is within normal limits. No acute osseous  abnormalities are seen. A left-sided dual-lumen catheter is noted ending within the right atrium.  IMPRESSION: No acute cardiopulmonary process seen.   Electronically Signed   By: Dave Estrada M.D.   On: 12/17/2014 00:21    I have reviewed the patient'Estrada current medications.  Assessment/Plan: Problem #1 history of fever: Patient is afebrile today. His blood culture grows gram positive cocci in cluster. Presently patient is on antibiotics and is feeling much better. Patient with previous history of MRSA infection and SVC vegetation from previous catheter. Patient was treated with vancomycin and removal of his catheter. Presently he has a new catheter on the left side. Problem #2 end-stage renal disease: His status post hemodialysis on Wednesday. Presently patient doesn't have any uremic sign and symptoms and she is due for dialysis today. Problem #3 anemia: His hemoglobin and hematocrit is without target goal Problem #4 history of HIV: Patient on antiviral medications. Problem #5 metabolic bone disease: His calcium in the range Problem #6 fluid management: Patient does not have any sign of fluid overload. Plan: We'll make arrangements for patient to get dialysis today. 2] if the organism is MRSA possibly his catheter may need to come out and in that case we'll ask Dr. Lovell Sheehan tumor present tunneled catheter. 3] for his next dialysis possibly use his fistula is more than 6-8 weeks. If not patient may require another tunneled catheter.  4] check his CBC , phosphorus and basic metabolic panel in the morning   LOS: 1 day  Dave Estrada 12/18/2014,8:41 AM

## 2014-12-18 NOTE — Progress Notes (Signed)
Infection control nurse Junious Dresser, RN called to verify patient's isolation status. Patient was on enteric due to possible Cdiff and droplet for possible flu. Cdiff and Flu negative. Respiratory Virus panel and stool culture pending. Verified with Dr. Sherrie Mustache that patient does not need to be on droplet or enteric precautions. Patient will continue to be on contact precautions due to MRSA positive.

## 2014-12-18 NOTE — Progress Notes (Signed)
CRITICAL VALUE ALERT  Critical value received:  Blood culture MRSA positive  Date of notification:  12/20/14  Time of notification:  1524  Critical value read back:Yes.    Nurse who received alert:  Darvin Neighbours, RN  MD notified (1st page):  Dr. Sherrie Mustache  Time of first page:  1524  Responding MD:  Dr. Sherrie Mustache   Time MD responded:  [phone]

## 2014-12-18 NOTE — Care Management Note (Addendum)
    Page 1 of 1   12/21/2014     3:31:57 PM CARE MANAGEMENT NOTE 12/21/2014  Patient:  Dave Estrada, Dave Estrada   Account Number:  [id number]  Date Initiated:  12/18/2014  Documentation initiated by:  Kathyrn Sheriff  Subjective/Objective Assessment:   Pt is from home with self care. Pt lives with father and stepmother. Pt has no HH services or DME's. Pt is on HD three days a week and goes to [facility] in [place].     Action/Plan:   Pt plans to dsicharge home with self care. Pt has no CM needs.   Anticipated DC Date:  12/21/2014   Anticipated DC Plan:  HOME/SELF CARE      DC Planning Services  CM consult      Choice offered to / List presented to:             Status of service:  Completed, signed off Medicare Important Message given?   (If response is "NO", the following Medicare IM given date fields will be blank) Date Medicare IM given:   Medicare IM given by:   Date Additional Medicare IM given:   Additional Medicare IM given by:    Discharge Disposition:  HOME/SELF CARE  Per UR Regulation:  Reviewed for med. necessity/level of care/duration of stay  If discussed at Long Length of Stay Meetings, dates discussed:    Comments:  12/21/2014 1530 Kathyrn Sheriff, RN, MSN, CM Pt being discharge home today after dialysis. Pt will need 6 weeks IV abx during dialysis. [facility] in [place] contacted and can provide IV vanc during HD sessions. Will fax H&P to dialysis center after discharge. No further CM needs. 12/18/2014 1500 Kathyrn Sheriff, RN, MSN, CM Pt will need prolonged IV abx and we anticipate those will be administered through HD. Discharge not anticipated over weekend. 12/16/2014 1300 Kathyrn Sheriff, RN, MSN, CM

## 2014-12-18 NOTE — Consult Note (Signed)
CARDIOLOGY CONSULT NOTE   Patient ID: Dave Estrada MRN: [id number] DOB/AGE: 1991/10/11 23 y.o.  Admit Date: 12/16/2014 Referring Physician: PTH-Fisher MD Primary Physician: No PCP Per Patient Consulting Cardiologist: Prentice Docker MD Primary Cardiologist: New Reason for Consultation: Need for TEE with known vegetation of 5+cm X 0.9 cm in SVC.   Clinical Summary Dave Estrada is a 24 y.o.male with known history of SBE, sepsis from dialysis catheter with admission in November 2015, HIV, had TEE in November 2010, which demonstrated vegetation from HD catheter in SVC entering IVC, MRSA bacteremia,  admitted with fever thought initially to be due to UTI. Was seen in ER and was placed on Cipro. He continued to have fevers with T-Max of 104, with NVD. Blood cultures were drawn and found to be positive for gram positive cocci in clusters. As a result of this, Dr. Sherrie Mustache spoke with ID who recommended repeat TEE. We are asked to see patient in anticipation of this.   He states he is feeling better, but still has some diarrhea and stomach gurgling. States it burns in his throat when he eats sometimes No chest pain or dyspnea. He is being followed by nephrology to plan dialysis during hospitalization.Heis currently afebrile on  Vancomycin IV.    Allergies  Allergen Reactions  . Other Anaphylaxis  . Aspirin Other (See Comments)    Reaction is unknown    Medications Scheduled Medications: . Chlorhexidine Gluconate Cloth  6 each Topical Q0600  . emtricitabine  200 mg Oral Once per day on Mon Fri  . etravirine  200 mg Oral Q12H  . heparin subcutaneous  5,000 Units Subcutaneous 3 times per day  . mupirocin ointment  1 application Nasal BID  . piperacillin-tazobactam (ZOSYN)  IV  2.25 g Intravenous Q8H  . raltegravir  400 mg Oral BID  . sodium chloride  3 mL Intravenous Q12H  . sodium chloride  3 mL Intravenous Q12H  . [START ON 12/21/2014] tenofovir  300 mg Oral Q Mon  . vancomycin   1,000 mg Intravenous Q M,W,F-HD      PRN Medications: sodium chloride, albuterol, ibuprofen, ondansetron **OR** ondansetron (ZOFRAN) IV, sodium chloride   Past Medical History  Diagnosis Date  . HIV (human immunodeficiency virus infection)   . ESRD (end stage renal disease)   . Staphylococcus aureus bacteremia with sepsis 09/09/2014  . A-V fistula     Placed on 10/27/2014 at [facility]; for future HD use.     Past Surgical History  Procedure Laterality Date  . Port a cath revision    . Tee without cardioversion N/A 09/14/2014    Procedure: TRANSESOPHAGEAL ECHOCARDIOGRAM (TEE);  Surgeon: Laurey Morale, MD;  Location: [facility] ENDOSCOPY;  Service: Cardiovascular;  Laterality: N/A;  will be at [facility] by mon  . Insertion of dialysis catheter Left 09/15/2014    Procedure: INSERTION OF DIALYSIS CATHETER;  Surgeon: Nada Libman, MD;  Location: [facility] OR;  Service: Vascular;  Laterality: Left;    Family History  Problem Relation Age of Onset  . Seizures Father     Social History Dave Estrada reports that he has been smoking Cigarettes.  He has a 11 pack-year smoking history. He has never used smokeless tobacco. Dave Estrada reports that he does not drink alcohol.  Review of Systems Complete review of systems are found to be negative unless outlined in H&P above.  Physical Examination Blood pressure 103/58, pulse 104, temperature 98.7 F (37.1 C), temperature source Oral, resp. rate 20,  height  (1.651 m), weight 140 lb (63.504 kg), SpO2 99 %.  Intake/Output Summary (Last 24 hours) at 12/18/14 1025 Last data filed at 12/17/14 1312  Gross per 24 hour  Intake     50 ml  Output      0 ml  Net     50 ml    Telemetry: NSR   GEN: No acute distress  HEENT: Conjunctiva and lids normal, oropharynx clear with moist mucosa. Neck: Supple, no elevated JVP or carotid bruits, no thyromegaly. Lungs: Clear to auscultation, nonlabored breathing at rest. Cardiac: Regular rate and rhythm, no S3 or  significant systolic murmur, no pericardial rub. Abdomen: Soft, nontender, no hepatomegaly, bowel sounds present, no guarding or rebound. Extremities: No pitting edema, distal pulses 2+. Skin: Warm and dry. Musculoskeletal: No kyphosis. Neuropsychiatric: Alert and oriented x3, affect grossly appropriate.  Prior Cardiac Testing/Procedures 1. TEE 09/15/2015 Findings: Please see echo section for full report. Mildly dilated LV with EF estimated 45%, diffuse mild hypokinesis. Mild mitral regurgitation. Normal RV size and systolic function. There was a vegetation 5+cm x 0.9 cm that was in the SVC entering the right atrium. I suspect this is a cast remaining after HD catheter removal (vegetation was originally around the catheter). The tricuspid and pulmonic valves did not have vegetations. Normal aortic valve.   Impression: Residual vegetation from HD catheter in SVC entering IVC, does not appear to involve the right-sided valves. Mild cardiomyopathy.   2. Echocardiogram 09/15/2015 Left ventricle: Mildly dilated LV with EF estimated 45%, diffuse mild hypokinesis. - Aortic valve: No evidence of vegetation. There was no stenosis. - Mitral valve: No evidence of vegetation. There was mild regurgitation. - Left atrium: No evidence of thrombus in the atrial cavity or appendage. - Right ventricle: The cavity size was normal. Systolic function was normal. - Right atrium: There was a vegetation 5+cm x 0.9 cm that was in the SVC entering the right atrium. I suspect this is a cast remaining after HD catheter removal (vegetation was originally around the catheter). - Tricuspid valve: No evidence of vegetation. There was trivial regurgitation. - Pulmonic valve: No evidence of vegetation.   Lab Results:   Basic Metabolic Panel:  Recent Labs Lab 12/14/14 1344 12/16/14 2256 12/17/14 0628 12/18/14 0629  NA 131* 133* 133* 136  K 4.6 3.4* 3.7 3.7  CL 102 96 97 101  CO2 GLUCOSE 99 134* 134* 105*  BUN 56* 40* 42* 49*  CREATININE 10.51* 8.07* 8.34* 9.10*  CALCIUM 8.1* 7.9* 7.6* 8.0*    Liver Function Tests:  Recent Labs Lab 12/14/14 1344 12/16/14 2256  AST 22 23  ALT 11 15  ALKPHOS 102 73  BILITOT 1.0 0.7  PROT 8.4* 7.8  ALBUMIN 4.5 3.7    CBC:  Recent Labs Lab 12/14/14 1344 12/16/14 2256 12/17/14 0628 12/18/14 0629  WBC 11.7* 7.7 7.2 5.9  NEUTROABS 10.9* 6.6  --   --   HGB 12.4* 11.4* 10.1* 10.3*  HCT 37.8* 34.1* 30.6* 30.3*  MCV 104.4* 101.5* 102.0* 101.7*  PLT 204 157 137* 131*    Cardiac Enzymes:  Recent Labs Lab 12/16/14 2324  TROPONINI <0.03    Radiology: Dg Chest 2 View  12/17/2014   CLINICAL DATA:  Acute onset of fever, productive cough and substernal chest pain. Initial encounter.  EXAM: CHEST  2 VIEW  COMPARISON:  Chest radiograph from 09/15/2014  FINDINGS: The lungs are well-aerated and clear. There is no evidence of focal  opacification, pleural effusion or pneumothorax.  The heart is normal in size; the mediastinal contour is within normal limits. No acute osseous abnormalities are seen. A left-sided dual-lumen catheter is noted ending within the right atrium.  IMPRESSION: No acute cardiopulmonary process seen.   Electronically Signed   By: [name] M.D.   On: 12/17/2014 00:21     ECG:  EKG sinus tachycardia 127 bpm.     Impression and Recommendations  1. Hx of SBE with known vegetation: Request for repeat TEE per PTH in the setting of sepsis with positive blood cultures and fever. He is currently on IV vancomycin, and is now feeling better, and afebrile. Will discuss with Dr. Purvis Sheffield the necessity of repeating TEE. If to go forward with this, it will be planned for Monday, December 21 2014. Please see Dr. Junius Argyle note for more recommendations.   Signed: Bettey Mare. Lawrence NP [facility]  12/18/2014, 10:25 AM Co-Sign MD:  The patient was seen and examined, and I agree with the assessment and plan  as documented above, with modifications as noted below. 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 Also has ESRD on hemodialysis and had another catheter placed on 09/15/14 and AV graft placed in 10/2014. Currently admitted with Staph Aureus bacteremia (MRSA). ID has evaluated pt and recommend TEE be repeated to exclude valvular vegetations. He is currently feeling better and denies chest pain, palpitations, fevers, and shortness of breath. He has no pathologic murmurs to suggest valve dehiscence. He will require 6 weeks total of antimicrobial therapy regardless. If vegetation is excluded, would consider AV graft as possible source. He is still with HD catheter but ID has recommended removal. Will plan for TEE on Monday, 12/21/14.

## 2014-12-18 NOTE — Progress Notes (Signed)
[facility] PROGRESS NOTE  Dave Estrada ZOX:[id number] DOB: 12-09-1990 DOA: 12/16/2014 PCP: No PCP Per Patient    Code Status: Full code Family Communication: Family not available; discussed with patient. Disposition Plan: Discharge when clinically appropriate.   Consultants:  Cardiology, pending  Nephrology  Procedures:  Hemodialysis  Antibiotics:  Zosyn 3/3>>  Vancomycin 3/3>>  HPI/Subjective: Patient is sitting up in bed. He says that his cough, and loose stools have subsided. He does smoke about half a pack of cigarettes per day. He denies chest pain.  Objective: Filed Vitals:   12/18/14 0520  BP: 103/58  Pulse: 104  Temp: 98.7 F (37.1 C)  Resp: 20   oxygen saturation 99% on room air.  Intake/Output Summary (Last 24 hours) at 12/18/14 0714 Last data filed at 12/17/14 1312  Gross per 24 hour  Intake     50 ml  Output      0 ml  Net     50 ml   Filed Weights   12/17/14 0207 12/17/14 0603 12/18/14 0500  Weight: 69.82 kg (153 lb 14.8 oz) 68.56 kg (151 lb 2.4 oz) 63.504 kg (140 lb)    Exam:   General:  Small framed 24 year old African-American man sitting up in bed, in no acute distress.  Cardiovascular: S1, S2, with borderline tachycardia. No murmurs rubs or gallops.  Respiratory: Rare wheeze, otherwise clear; breathing nonlabored.  Abdomen: Positive bowel sounds, soft, nontender, nondistended.  Musculoskeletal: No acute hot red joints. No pedal edema.  Neurologic/psychiatric: He has a flat affect. He is alert and oriented 3. Speech is clear. Cranial nerves are intact.   Data Reviewed: Basic Metabolic Panel:  Recent Labs Lab 12/14/14 1344 12/16/14 2256 12/17/14 0628  NA 131* 133* 133*  K 4.6 3.4* 3.7  CL 102 96 97  CO2 GLUCOSE 99 134* 134*  BUN 56* 40* 42*  CREATININE 10.51* 8.07* 8.34*  CALCIUM 8.1* 7.9* 7.6*   Liver Function Tests:  Recent Labs Lab 12/14/14 1344 12/16/14 2256  AST 22 23  ALT 11 15   ALKPHOS 102 73  BILITOT 1.0 0.7  PROT 8.4* 7.8  ALBUMIN 4.5 3.7   No results for input(s): LIPASE, AMYLASE in the last 168 hours. No results for input(s): AMMONIA in the last 168 hours. CBC:  Recent Labs Lab 12/14/14 1344 12/16/14 2256 12/17/14 0628 12/18/14 0629  WBC 11.7* 7.7 7.2 5.9  NEUTROABS 10.9* 6.6  --   --   HGB 12.4* 11.4* 10.1* 10.3*  HCT 37.8* 34.1* 30.6* 30.3*  MCV 104.4* 101.5* 102.0* 101.7*  PLT 204 157 137* 131*   Cardiac Enzymes:  Recent Labs Lab 12/16/14 2324  TROPONINI <0.03   BNP (last 3 results) No results for input(s): BNP in the last 8760 hours.  ProBNP (last 3 results) No results for input(s): PROBNP in the last 8760 hours.  CBG: No results for input(s): GLUCAP in the last 168 hours.  Recent Results (from the past 240 hour(s))  Culture, blood (routine x 2)     Status: None (Preliminary result)   Collection Time: 12/16/14 11:34 PM  Result Value Ref Range Status   Specimen Description BLOOD RIGHT ARM  Final   Special Requests BOTTLES DRAWN AEROBIC AND ANAEROBIC 10CC EACH  Final   Culture   Final    GRAM POSITIVE COCCI IN CLUSTERS Gram Stain Report Called to,Read Back By and Verified With: CHAPPELLE,R. AT 1335 ON 12/17/2014 BY BAUGHAM,M. Performed at [facility]  Report Status PENDING  Incomplete  Culture, blood (routine x 2)     Status: None (Preliminary result)   Collection Time: 12/16/14 11:34 PM  Result Value Ref Range Status   Specimen Description BLOOD RIGHT ANTECUBITAL  Final   Special Requests   Final    BOTTLES DRAWN AEROBIC AND ANAEROBIC AEB=10CC ANA=8CC   Culture   Final    GRAM POSITIVE COCCI IN CLUSTERS Gram Stain Report Called to,Read Back By and Verified With: CHAPPELLE,R. AT 1335 ON 12/17/2014 BY BAUGHAM,M. Performed at [facility]    Report Status PENDING  Incomplete  Urine culture     Status: None (Preliminary result)   Collection Time: 12/17/14 12:17 AM  Result Value Ref Range Status    Specimen Description URINE, CATHETERIZED  Final   Special Requests IMMUNE:COMPROMISED  Final   Colony Count PENDING  Incomplete   Culture PENDING  Incomplete   Report Status PENDING  Incomplete  MRSA PCR Screening     Status: Abnormal   Collection Time: 12/17/14  2:01 AM  Result Value Ref Range Status   MRSA by PCR POSITIVE (A) NEGATIVE Final    Comment:        The GeneXpert MRSA Assay (FDA approved for NASAL specimens only), is one component of a comprehensive MRSA colonization surveillance program. It is not intended to diagnose MRSA infection nor to guide or monitor treatment for MRSA infections. RESULT CALLED TO, READ BACK BY AND VERIFIED WITH: HANDY T AT 0507 ON [id number] BY FORSYTH K   Urine culture     Status: None (Preliminary result)   Collection Time: 12/17/14  5:43 AM  Result Value Ref Range Status   Specimen Description URINE, CLEAN CATCH  Final   Special Requests NONE  Final   Colony Count PENDING  Incomplete   Culture PENDING  Incomplete   Report Status PENDING  Incomplete  Clostridium Difficile by PCR     Status: None   Collection Time: 12/17/14  6:53 AM  Result Value Ref Range Status   C difficile by pcr NEGATIVE NEGATIVE Final  Culture, sputum-assessment     Status: None   Collection Time: 12/17/14 11:25 AM  Result Value Ref Range Status   Specimen Description SPUTUM EXPECTORATED  Final   Special Requests NONE  Final   Sputum evaluation   Final    THIS SPECIMEN IS ACCEPTABLE. RESPIRATORY CULTURE REPORT TO FOLLOW. Performed at [facility]    Report Status 12/17/2014 FINAL  Final  Culture, respiratory (NON-Expectorated)     Status: None (Preliminary result)   Collection Time: 12/17/14 11:25 AM  Result Value Ref Range Status   Specimen Description SPUTUM EXPECTORATED  Final   Special Requests NONE  Final   Gram Stain PENDING  Incomplete   Culture PENDING  Incomplete   Report Status PENDING  Incomplete     Studies: Dg Chest 2 View  12/17/2014    CLINICAL DATA:  Acute onset of fever, productive cough and substernal chest pain. Initial encounter.  EXAM: CHEST  2 VIEW  COMPARISON:  Chest radiograph from 09/15/2014  FINDINGS: The lungs are well-aerated and clear. There is no evidence of focal opacification, pleural effusion or pneumothorax.  The heart is normal in size; the mediastinal contour is within normal limits. No acute osseous abnormalities are seen. A left-sided dual-lumen catheter is noted ending within the right atrium.  IMPRESSION: No acute cardiopulmonary process seen.   Electronically Signed   By: [name] M.D.   On:  12/17/2014 00:21    Scheduled Meds: . Chlorhexidine Gluconate Cloth  6 each Topical Q0600  . emtricitabine  200 mg Oral Once per day on Mon Fri  . etravirine  200 mg Oral Q12H  . heparin subcutaneous  5,000 Units Subcutaneous 3 times per day  . mupirocin ointment  1 application Nasal BID  . piperacillin-tazobactam (ZOSYN)  IV  2.25 g Intravenous Q8H  . raltegravir  400 mg Oral BID  . sodium chloride  3 mL Intravenous Q12H  . sodium chloride  3 mL Intravenous Q12H  . [START ON 12/21/2014] tenofovir  300 mg Oral Q Mon  . 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  -Current blood cultures positive for gram-positive cocci, likely MRSA. -Source of current bacteremia could be the recent left upper extremity AV fistula placed at [facility] 1//12 2016 by Dr. Fuller Canada or replaced hemodialysis catheter performed on 09/15/2014 at [facility]. -Patient has a history of MRSA bacteremia with sepsis 08/2014 secondary a vegetation in the SVC associated with the previous dialysis catheter. It was removed and the patient was treated with a total of 6 weeks of IV vancomycin. Repeat blood cultures prior to previous hospital discharge were negative. -Discussed patient briefly with his ID physician, Dr. Janora Norlander prior to the positive blood cultures on 3/3. He agreed that TEE could be held unless blood cultures became positive. Therefore, will consult cardiology. -If there is no evidence of vegetation, then will look at the left upper extremity AV fistula as a potential source-then will consult with his vascular surgeon in [place]. -We'll continue vancomycin and Zosyn for now with discontinuation of Zosyn in the next 24-48 hours, once identification of blood cultures has been confirmed. -Currently patient is afebrile.  Pyrexia, likely secondary to #1. -Urine culture, sputum culture, stool culture are pending. -C. difficile PCR was negative. Stool culture pending. -Influenza panel by PCR was negative. -Vomiting and diarrhea have subsided.  HIV infection. -Per chart review, his CD4 count was 216 and his viral load was undetectable on 10/22/14. -We'll continue anti-retroviral medications.  End-stage renal disease. -Hemodialysis per nephrology team.  Chronic macrocytic anemia and thrombocytopenia. -Both likely secondary to HIV infection and/or antiretroviral medications. -Will check a vitamin B12 level and TSH for completion of workup.  Tobacco abuse. The patient was advised to stop smoking. We'll order when necessary Xopenex for occasional wheezes. Patient declines nicotine patch.    Time spent: 35 minutes    [facility]  [facility] Pager [phone]. If 7PM-7AM, please contact night-coverage at [email or web address], password [facility] 12/18/2014, 7:14 AM  LOS: 1 day

## 2014-12-19 DIAGNOSIS — D539 Nutritional anemia, unspecified: Secondary | ICD-10-CM

## 2014-12-19 DIAGNOSIS — R509 Fever, unspecified: Secondary | ICD-10-CM

## 2014-12-19 LAB — CULTURE, BLOOD (ROUTINE X 2)

## 2014-12-19 LAB — URINE CULTURE: Colony Count: >=100000

## 2014-12-19 LAB — HEPATITIS B SURFACE ANTIGEN: Hepatitis B Surface Ag: NEGATIVE

## 2014-12-19 NOTE — Consult Note (Signed)
Asked to remove permacath due to positive blood cultures. Permacath removed without difficulty. Catheter tip sent for culture. Pressure held. Patient tolerated procedure well.

## 2014-12-19 NOTE — Progress Notes (Signed)
  Echocardiogram 2D Echocardiogram has been performed.  Dave Estrada 12/19/2014, 9:16 AM

## 2014-12-19 NOTE — Progress Notes (Signed)
Subjective: Interval History: The patient offers no complaints. Presently he denies any fever chills or sweating. His appetite is good.  Objective: Vital signs in last 24 hours: Temp:  [98.1 F (36.7 C)-99 F (37.2 C)] 98.3 F (36.8 C) (03/05 0649) Pulse Rate:  [80-111] 95 (03/05 0649) Resp:  [18-20] 20 (03/05 0649) BP: (97-140)/(49-77) 106/68 mmHg (03/05 0649) SpO2:  [98 %-100 %] 100 % (03/05 0649) Weight:  [57.8 kg (127 lb 6.8 oz)-63.5 kg (139 lb 15.9 oz)] 57.8 kg (127 lb 6.8 oz) (03/05 0649) Weight change: -0.004 kg (-0.1 oz)  Intake/Output from previous day: 03/04 0701 - 03/05 0700 In: 360 [P.O.:360] Out: 3500 [Urine:500] Intake/Output this shift:    General appearance: alert, cooperative and no distress Resp: clear to auscultation bilaterally Cardio: regular rate and rhythm, S1, S2 normal, no murmur, click, rub or gallop GI: soft, non-tender; bowel sounds normal; no masses,  no organomegaly Extremities: extremities normal, atraumatic, no cyanosis or edema  Lab Results:  Recent Labs  12/17/14 0628 12/18/14 0629  WBC 7.2 5.9  HGB 10.1* 10.3*  HCT 30.6* 30.3*  PLT 137* 131*   BMET:   Recent Labs  12/17/14 0628 12/18/14 0629  NA 133* 136  K 3.7 3.7  CL 97 101  CO2 25 24  GLUCOSE 134* 105*  BUN 42* 49*  CREATININE 8.34* 9.10*  CALCIUM 7.6* 8.0*   No results for input(s): PTH in the last 72 hours. Iron Studies: No results for input(s): IRON, TIBC, TRANSFERRIN, FERRITIN in the last 72 hours.  Studies/Results: No results found.  I have reviewed the patient's current medications.  Assessment/Plan: Problem #1 history of fever: Patient is afebrile today. Patient blood culture is positive for MRSA. Presently he has left-sided tunneled catheter for dialysis. Problem #2 end-stage renal disease: Status post hemodialysis yesterday. Presently he is asymptomatic. Problem #3 anemia: His hemoglobin and hematocrit is with in our  target goal and hemoglobin is  stable. Problem #4 history of HIV: Patient on antiviral medications. Problem #5 metabolic bone disease: His calcium in the range Problem #6 fluid management: Patient does not have any sign of fluid overload. Plan: We'll go ahead and remove his catheter. On Monday will discuss with vascular surgery and if they allow was to use his fistula will do so. If not patient may need to have another tunneled catheter. We'll check his basic metabolic panel is the morning.   LOS: 2 days   Kainan Patty S 12/19/2014,9:45 AM

## 2014-12-19 NOTE — Progress Notes (Signed)
Central telemetry notified that patient's heart rate was sustaining in the 130s with the highest being 150s. Patient was up and walking around room during the time of notification. Patient was asymptomatic. MD was notified. Patient returned to bed to rest. Will continue to monitor.

## 2014-12-19 NOTE — Progress Notes (Signed)
[facility] PROGRESS NOTE  Dave Estrada UJW:[id number] DOB: 1991-09-06 DOA: 12/16/2014 PCP: No PCP Per Patient    Code Status: Full code Family Communication: Family not available; discussed with patient. Disposition Plan: Discharge when clinically appropriate.   Consultants:  Gen. surgery (removal of hemodialysis catheter)  Cardiology  Nephrology  Procedures:  2-D echocardiogram pending  Removal of permacath/HD catheter, 12/19/14  Hemodialysis  Antibiotics:  Zosyn 3/3>> 12/18/14  Vancomycin 3/3>>  HPI/Subjective: Patient feels better. He denies cough or diarrhea.  Objective: Filed Vitals:   12/19/14 0649  BP: 106/68  Pulse: 95  Temp: 98.3 F (36.8 C)  Resp: 20   oxygen saturation 100% on room air.  Intake/Output Summary (Last 24 hours) at 12/19/14 1356 Last data filed at 12/18/14 1840  Gross per 24 hour  Intake      0 ml  Output   3000 ml  Net  -3000 ml   Filed Weights   12/18/14 0500 12/18/14 1505 12/19/14 0649  Weight: 63.504 kg (140 lb) 63.5 kg (139 lb 15.9 oz) 57.8 kg (127 lb 6.8 oz)    Exam:   General:  Small framed 24 year old African-American man sitting up in bed, in no acute distress.  Cardiovascular: S1, S2, with borderline tachycardia. No murmurs rubs or gallops.  Respiratory: Clear to auscultation; breathing nonlabored.  Abdomen: Positive bowel sounds, soft, nontender, nondistended.  Musculoskeletal: No acute hot red joints. No pedal edema.  Neurologic/psychiatric: He has a flat affect. He is alert and oriented 3. Speech is clear. Cranial nerves are intact.   Data Reviewed: Basic Metabolic Panel:  Recent Labs Lab 12/14/14 1344 12/16/14 2256 12/17/14 0628 12/18/14 0629  NA 131* 133* 133* 136  K 4.6 3.4* 3.7 3.7  CL 102 96 97 101  CO2 GLUCOSE 99 134* 134* 105*  BUN 56* 40* 42* 49*  CREATININE 10.51* 8.07* 8.34* 9.10*  CALCIUM 8.1* 7.9* 7.6* 8.0*   Liver Function Tests:  Recent Labs Lab  12/14/14 1344 12/16/14 2256  AST 22 23  ALT 11 15  ALKPHOS 102 73  BILITOT 1.0 0.7  PROT 8.4* 7.8  ALBUMIN 4.5 3.7   No results for input(s): LIPASE, AMYLASE in the last 168 hours. No results for input(s): AMMONIA in the last 168 hours. CBC:  Recent Labs Lab 12/14/14 1344 12/16/14 2256 12/17/14 0628 12/18/14 0629  WBC 11.7* 7.7 7.2 5.9  NEUTROABS 10.9* 6.6  --   --   HGB 12.4* 11.4* 10.1* 10.3*  HCT 37.8* 34.1* 30.6* 30.3*  MCV 104.4* 101.5* 102.0* 101.7*  PLT 204 157 137* 131*   Cardiac Enzymes:  Recent Labs Lab 12/16/14 2324  TROPONINI <0.03   BNP (last 3 results) No results for input(s): BNP in the last 8760 hours.  ProBNP (last 3 results) No results for input(s): PROBNP in the last 8760 hours.  CBG: No results for input(s): GLUCAP in the last 168 hours.  Recent Results (from the past 240 hour(s))  Culture, blood (routine x 2)     Status: None   Collection Time: 12/16/14 11:34 PM  Result Value Ref Range Status   Specimen Description BLOOD RIGHT ARM  Final   Special Requests BOTTLES DRAWN AEROBIC AND ANAEROBIC 10CC EACH  Final   Culture   Final    STAPHYLOCOCCUS AUREUS Note: SUSCEPTIBILITIES PERFORMED ON PREVIOUS CULTURE WITHIN THE LAST 5 DAYS. Note: Gram Stain Report Called to,Read Back By and Verified With: R CHAPPELLE 12/17/14 1335 BY Luetta Nutting M Performed at [facility]    Report Status 12/19/2014 FINAL  Final  Culture, blood (routine x 2)     Status: None   Collection Time: 12/16/14 11:34 PM  Result Value Ref Range Status   Specimen Description BLOOD RIGHT ANTECUBITAL  Final   Special Requests   Final    BOTTLES DRAWN AEROBIC AND ANAEROBIC AEB=10CC ANA=8CC   Culture   Final    METHICILLIN RESISTANT STAPHYLOCOCCUS AUREUS Note: RIFAMPIN AND GENTAMICIN SHOULD NOT BE USED AS SINGLE DRUGS FOR TREATMENT OF STAPH INFECTIONS. CRITICAL RESULT CALLED TO, READ BACK BY AND VERIFIED WITH: CHRISTINA KNIGHT 12/18/14 1525 BY [name] This organism DOES NOT  demonstrate inducible  Clindamycin resistance in vitro. Note: Gram Stain Report Called to,Read Back By and Verified With: R CHAPPELLE 12/17/14 1335 BY Luetta Nutting M Performed at [facility]    Report Status 12/19/2014 FINAL  Final   Organism ID, Bacteria METHICILLIN RESISTANT STAPHYLOCOCCUS AUREUS  Final      Susceptibility   Methicillin resistant staphylococcus aureus - MIC*    CLINDAMYCIN <=0.25 SENSITIVE Sensitive     ERYTHROMYCIN >=8 RESISTANT Resistant     GENTAMICIN <=0.5 SENSITIVE Sensitive     LEVOFLOXACIN 4 INTERMEDIATE Intermediate     OXACILLIN >=4 RESISTANT Resistant     PENICILLIN >=0.5 RESISTANT Resistant     RIFAMPIN <=0.5 SENSITIVE Sensitive     TRIMETH/SULFA <=10 SENSITIVE Sensitive     VANCOMYCIN 1 SENSITIVE Sensitive     TETRACYCLINE <=1 SENSITIVE Sensitive     * METHICILLIN RESISTANT STAPHYLOCOCCUS AUREUS  Urine culture     Status: None (Preliminary result)   Collection Time: 12/17/14 12:17 AM  Result Value Ref Range Status   Specimen Description URINE, CATHETERIZED  Final   Special Requests IMMUNE:COMPROMISED  Final   Colony Count   Final    >=100,000 COLONIES/ML Performed at [facility]    Culture   Final    ENTEROCOCCUS SPECIES Performed at [facility]    Report Status PENDING  Incomplete  MRSA PCR Screening     Status: Abnormal   Collection Time: 12/17/14  2:01 AM  Result Value Ref Range Status   MRSA by PCR POSITIVE (A) NEGATIVE Final    Comment:        The GeneXpert MRSA Assay (FDA approved for NASAL specimens only), is one component of a comprehensive MRSA colonization surveillance program. It is not intended to diagnose MRSA infection nor to guide or monitor treatment for MRSA infections. RESULT CALLED TO, READ BACK BY AND VERIFIED WITH: HANDY T AT 0507 ON [id number] BY FORSYTH K   Urine culture     Status: None   Collection Time: 12/17/14  5:43 AM  Result Value Ref Range Status   Specimen Description URINE, CLEAN  CATCH  Final   Special Requests NONE  Final   Colony Count NO GROWTH Performed at [facility]   Final   Culture NO GROWTH Performed at [facility]   Final   Report Status 12/18/2014 FINAL  Final  Clostridium Difficile by PCR     Status: None   Collection Time: 12/17/14  6:53 AM  Result Value Ref Range Status   C difficile by pcr NEGATIVE NEGATIVE Final  Culture, sputum-assessment     Status: None   Collection Time: 12/17/14 11:25 AM  Result Value Ref Range Status   Specimen Description SPUTUM EXPECTORATED  Final   Special Requests NONE  Final   Sputum evaluation   Final  THIS SPECIMEN IS ACCEPTABLE. RESPIRATORY CULTURE REPORT TO FOLLOW. Performed at [facility]    Report Status 12/17/2014 FINAL  Final  Culture, respiratory (NON-Expectorated)     Status: None (Preliminary result)   Collection Time: 12/17/14 11:25 AM  Result Value Ref Range Status   Specimen Description SPUTUM EXPECTORATED  Final   Special Requests NONE  Final   Gram Stain   Final    RARE WBC PRESENT, PREDOMINANTLY PMN FEW SQUAMOUS EPITHELIAL CELLS PRESENT FEW GRAM POSITIVE COCCI IN PAIRS RARE GRAM NEGATIVE RODS RARE GRAM POSITIVE RODS    Culture   Final    NORMAL OROPHARYNGEAL FLORA Performed at [facility]    Report Status PENDING  Incomplete     Studies: No results found.  Scheduled Meds: . Chlorhexidine Gluconate Cloth  6 each Topical Q0600  . emtricitabine  200 mg Oral Once per day on Mon Fri  . etravirine  200 mg Oral Q12H  . heparin subcutaneous  5,000 Units Subcutaneous 3 times per day  . mupirocin ointment  1 application Nasal BID  . raltegravir  400 mg Oral BID  . sodium chloride  3 mL Intravenous Q12H  . sodium chloride  3 mL Intravenous Q12H  . [START ON 12/21/2014] tenofovir  300 mg Oral Q Mon  . 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  -Current blood cultures positive for MRSA. -Source of current bacteremia could be from the recent left upper extremity AV fistula placed at [facility] 1//12 2016 by Dr. Fuller [place] or replaced hemodialysis catheter performed on 09/15/2014 at [facility]. -Briefly discussed patient with his ID physician, Dr. Janora Norlander. -ID Dr. Luciana Axe (via each chart) recommended removal of the HD catheter and a catheter holiday before replacing. -Patient remains on vancomycin. Patient will need 6 weeks of IV antibiotics. Zosyn was discontinued. -If TEE is negative, will look at the left upper extremity AV fistula as a potential source, then will consult his vascular surgeon in [place]. -Patient has a history of MRSA bacteremia with sepsis 08/2014 secondary a vegetation in the SVC associated with the previous dialysis catheter. It was removed and the patient was treated with a total of 6 weeks of IV vancomycin. Repeat blood cultures prior to previous hospital discharge were negative.  Pyrexia, likely secondary to #1. -Urine culture-growing enterococcus. -Sputum culture normal flora 1 -C. difficile PCR was negative. Stool and sputum culture pending. -Influenza panel by PCR was negative. -Vomiting and diarrhea have subsided.  HIV infection. -Per chart review, his CD4 count was 216 and his viral load was undetectable on 10/22/14. -We'll continue anti-retroviral medications.  End-stage renal disease. -Hemodialysis per nephrology team.  Chronic macrocytic anemia and thrombocytopenia. -Both likely secondary to HIV infection and/or antiretroviral medications. - vitamin B12 level and TSH for completion of  workup; pending  Tobacco abuse. The patient was advised to stop smoking. We'll order when necessary Xopenex for occasional wheezes. Patient declines nicotine patch.    Time spent: 35 minutes    [facility]  [facility] Pager [phone]. If 7PM-7AM, please  contact night-coverage at [email or web address], password [facility] 12/19/2014, 1:56 PM  LOS: 2 days

## 2014-12-20 ENCOUNTER — Encounter (HOSPITAL_COMMUNITY): Payer: Self-pay | Admitting: Internal Medicine

## 2014-12-20 DIAGNOSIS — I5189 Other ill-defined heart diseases: Secondary | ICD-10-CM

## 2014-12-20 DIAGNOSIS — I330 Acute and subacute infective endocarditis: Secondary | ICD-10-CM

## 2014-12-20 DIAGNOSIS — I519 Heart disease, unspecified: Secondary | ICD-10-CM

## 2014-12-20 DIAGNOSIS — I5023 Acute on chronic systolic (congestive) heart failure: Secondary | ICD-10-CM | POA: Diagnosis present

## 2014-12-20 HISTORY — DX: Other ill-defined heart diseases: I51.89

## 2014-12-20 HISTORY — DX: Heart disease, unspecified: I51.9

## 2014-12-20 LAB — BASIC METABOLIC PANEL
ANION GAP: 11 (ref 5–15)
BUN: 46 mg/dL — ABNORMAL HIGH (ref 6–23)
CO2: 27 mmol/L (ref 19–32)
CREATININE: 10.6 mg/dL — AB (ref 0.50–1.35)
Calcium: 8 mg/dL — ABNORMAL LOW (ref 8.4–10.5)
Chloride: 99 mmol/L (ref 96–112)
GFR calc Af Amer: 7 mL/min — ABNORMAL LOW (ref >90)
GFR calc non Af Amer: 6 mL/min — ABNORMAL LOW (ref >90)
Glucose, Bld: 103 mg/dL — ABNORMAL HIGH (ref 70–99)
Potassium: 3.7 mmol/L (ref 3.5–5.1)
SODIUM: 137 mmol/L (ref 135–145)

## 2014-12-20 LAB — CBC
HCT: 36.6 % — ABNORMAL LOW (ref 39.0–52.0)
HEMOGLOBIN: 12 g/dL — AB (ref 13.0–17.0)
MCH: 33.5 pg (ref 26.0–34.0)
MCHC: 32.8 g/dL (ref 30.0–36.0)
MCV: 102.2 fL — AB (ref 78.0–100.0)
Platelets: 188 10*3/uL (ref 150–400)
RBC: 3.58 MIL/uL — AB (ref 4.22–5.81)
RDW: 14.6 % (ref 11.5–15.5)
WBC: 5.7 10*3/uL (ref 4.0–10.5)

## 2014-12-20 LAB — CULTURE, RESPIRATORY W GRAM STAIN: Culture: NORMAL

## 2014-12-20 LAB — CULTURE, RESPIRATORY

## 2014-12-20 LAB — VITAMIN B12: Vitamin B-12: 254 pg/mL (ref 211–911)

## 2014-12-20 LAB — TSH: TSH: 1.391 u[IU]/mL (ref 0.350–4.500)

## 2014-12-20 MED ORDER — LISINOPRIL 2.5 MG PO TABS
1.2500 mg | ORAL_TABLET | Freq: Every day | ORAL | Status: DC
  Administered 2014-12-20: 1.25 mg via ORAL
  Filled 2014-12-20 (×4): qty 0.5

## 2014-12-20 NOTE — Progress Notes (Signed)
Subjective: Interval History: Patient denies any fever or chills or sweating. Patient also denies any difficulty in breathing. At this moment he offers no complaints.  Objective: Vital signs in last 24 hours: Temp:  [97.7 F (36.5 C)-98.4 F (36.9 C)] 97.7 F (36.5 C) (03/06 0612) Pulse Rate:  [95-98] 98 (03/06 0612) Resp:  [20] 20 (03/06 0612) BP: (96-124)/(58-70) 103/63 mmHg (03/06 0612) SpO2:  [96 %-100 %] 100 % (03/06 0612) Weight:  [52.8 kg (116 lb 6.5 oz)] 52.8 kg (116 lb 6.5 oz) (03/06 0612) Weight change: -10.7 kg (-23 lb 9.4 oz)  Intake/Output from previous day: 03/05 0701 - 03/06 0700 In: 240 [P.O.:240] Out: -  Intake/Output this shift:    General appearance: alert, cooperative and no distress Resp: clear to auscultation bilaterally Cardio: regular rate and rhythm, S1, S2 normal, no murmur, click, rub or gallop GI: soft, non-tender; bowel sounds normal; no masses,  no organomegaly Extremities: extremities normal, atraumatic, no cyanosis or edema  Lab Results:  Recent Labs  12/18/14 0629 12/20/14 0634  WBC 5.9 5.7  HGB 10.3* 12.0*  HCT 30.3* 36.6*  PLT 131* 188   BMET:   Recent Labs  12/18/14 0629 12/20/14 0634  NA 136 137  K 3.7 3.7  CL 101 99  CO2 24 27  GLUCOSE 105* 103*  BUN 49* 46*  CREATININE 9.10* 10.60*  CALCIUM 8.0* 8.0*   No results for input(s): PTH in the last 72 hours. Iron Studies: No results for input(s): IRON, TIBC, TRANSFERRIN, FERRITIN in the last 72 hours.  Studies/Results: No results found.  I have reviewed the patient's current medications.  Assessment/Plan: Problem #1 history of fever: Patient is afebrile today. Patient blood culture is positive for MRSA. Source of infection most likely from his catheter. Presently his catheter is taken out and isn't febrile. Patient has a fistula which is about 6 weeks. Problem #2 end-stage renal disease: Status post hemodialysis on Friday. Presently patient doesn't have any nausea or  vomiting. Problem #3 anemia: His hemoglobin is above our target range. Problem #4 history of HIV: Patient on antiviral medications. Problem #5 metabolic bone disease: His calcium in the range Problem #6 fluid management: Patient does not have any sign of fluid overload. Plan: Since his fistula is about 6 weeks possibly may be usable if vascular surgery agree with. At this moment that may be the best solution rather than putting another tunneled catheter. However that doesn't work patient would require another tunneled catheter tomorrow or the down after tomorrow. We'll check his phosphorus and basic metabolic panel is the morning.   LOS: 3 days   Jayquan Bradsher S 12/20/2014,9:08 AM

## 2014-12-20 NOTE — Progress Notes (Signed)
[facility] PROGRESS NOTE  Dave Estrada:[id number] DOB: [birth year] DOA: 12/16/2014 PCP: No PCP Per Patient    Code Status: Full code Family Communication: Family not available; discussed with patient. Disposition Plan: Discharge when clinically appropriate.   Consultants:  Gen. surgery (removal of hemodialysis catheter)  Cardiology  Nephrology  Procedures:  TEE, pending  2-D echocardiogram:  - Left ventricle: Systolic function is severely reduced. Estimated EF 20%. Diffuse hypokinesis seen. The cavity size was normal. Features are consistent with a pseudonormal left ventricular filling pattern, with concomitant abnormal relaxation and increased filling pressure (grade 2 diastolic dysfunction). Borderline left ventricular hypertrophy. - Mitral valve: Mildly thickened leaflets . There was mild regurgitation. - Right ventricle: Systolic function was mildly reduced. - Right atrium: Catheter protruding into right atrium. There is a small mobile mass at the tip. A vegetation cannot be excluded. Impressions: - Left ventricular systolic function is now severely reduced, EF 20%. There may be a vegetation attached to the tip of a catheter which protrudes into the right atrium.  Removal of permacath/HD catheter, 12/19/14  Hemodialysis  Antibiotics:  Zosyn 3/3>> 12/18/14  Vancomycin 3/3>>  HPI/Subjective: Patient was to go home. He has no complaints of chest pain, shortness of breath, or diarrhea.  Objective: Filed Vitals:   12/20/14 0612  BP: 103/63  Pulse: 98  Temp: 97.7 F (36.5 C)  Resp: 20   oxygen saturation 100% on room air.  Intake/Output Summary (Last 24 hours) at 12/20/14 1418 Last data filed at 12/20/14 0932  Gross per 24 hour  Intake    480 ml  Output    200 ml  Net    280 ml   Filed Weights   12/18/14 1505 12/19/14 0649 12/20/14 0612  Weight: 63.5 kg (139 lb 15.9 oz) 57.8 kg (127 lb 6.8 oz) 52.8 kg (116 lb 6.5 oz)     Exam:   General:  Small framed 24 year old African-American man sitting up in bed, in no acute distress.  Cardiovascular: S1, S2, with borderline tachycardia. No murmurs rubs or gallops.  Respiratory: Clear to auscultation; breathing nonlabored.  Abdomen: Positive bowel sounds, soft, nontender, nondistended.  Musculoskeletal: No acute hot red joints. No pedal edema.  Neurologic/psychiatric: He has a flat affect. He is alert and oriented 3. Speech is clear. Cranial nerves are intact.   Data Reviewed: Basic Metabolic Panel:  Recent Labs Lab 12/14/14 1344 12/16/14 2256 12/17/14 0628 12/18/14 0629 12/20/14 0634  NA 131* 133* 133* 136 137  K 4.6 3.4* 3.7 3.7 3.7  CL 102 96 97 101 99  CO2 20 25 25 24 27   GLUCOSE 99 134* 134* 105* 103*  BUN 56* 40* 42* 49* 46*  CREATININE 10.51* 8.07* 8.34* 9.10* 10.60*  CALCIUM 8.1* 7.9* 7.6* 8.0* 8.0*   Liver Function Tests:  Recent Labs Lab 12/14/14 1344 12/16/14 2256  AST 22 23  ALT 11 15  ALKPHOS 102 73  BILITOT 1.0 0.7  PROT 8.4* 7.8  ALBUMIN 4.5 3.7   No results for input(s): LIPASE, AMYLASE in the last 168 hours. No results for input(s): AMMONIA in the last 168 hours. CBC:  Recent Labs Lab 12/14/14 1344 12/16/14 2256 12/17/14 0628 12/18/14 0629 12/20/14 0634  WBC 11.7* 7.7 7.2 5.9 5.7  NEUTROABS 10.9* 6.6  --   --   --   HGB 12.4* 11.4* 10.1* 10.3* 12.0*  HCT 37.8* 34.1* 30.6* 30.3* 36.6*  MCV 104.4* 101.5* 102.0* 101.7* 102.2*  PLT 204 157 137* 131* 188   Cardiac  Enzymes:  Recent Labs Lab 12/16/14 2324  TROPONINI <0.03   BNP (last 3 results) No results for input(s): BNP in the last 8760 hours.  ProBNP (last 3 results) No results for input(s): PROBNP in the last 8760 hours.  CBG: No results for input(s): GLUCAP in the last 168 hours.  Recent Results (from the past 240 hour(s))  Culture, blood (routine x 2)     Status: None   Collection Time: 12/16/14 11:34 PM  Result Value Ref Range Status    Specimen Description BLOOD RIGHT ARM  Final   Special Requests BOTTLES DRAWN AEROBIC AND ANAEROBIC 10CC EACH  Final   Culture   Final    STAPHYLOCOCCUS AUREUS Note: SUSCEPTIBILITIES PERFORMED ON PREVIOUS CULTURE WITHIN THE LAST 5 DAYS. Note: Gram Stain Report Called to,Read Back By and Verified With: R CHAPPELLE 12/17/14 1335 BY Luetta Nutting M Performed at [facility]    Report Status 12/19/2014 FINAL  Final  Culture, blood (routine x 2)     Status: None   Collection Time: 12/16/14 11:34 PM  Result Value Ref Range Status   Specimen Description BLOOD RIGHT ANTECUBITAL  Final   Special Requests   Final    BOTTLES DRAWN AEROBIC AND ANAEROBIC AEB=10CC ANA=8CC   Culture   Final    METHICILLIN RESISTANT STAPHYLOCOCCUS AUREUS Note: RIFAMPIN AND GENTAMICIN SHOULD NOT BE USED AS SINGLE DRUGS FOR TREATMENT OF STAPH INFECTIONS. CRITICAL RESULT CALLED TO, READ BACK BY AND VERIFIED WITH: CHRISTINA KNIGHT 12/18/14 1525 BY [name] This organism DOES NOT demonstrate inducible  Clindamycin resistance in vitro. Note: Gram Stain Report Called to,Read Back By and Verified With: R CHAPPELLE 12/17/14 1335 BY Luetta Nutting M Performed at [facility]    Report Status 12/19/2014 FINAL  Final   Organism ID, Bacteria METHICILLIN RESISTANT STAPHYLOCOCCUS AUREUS  Final      Susceptibility   Methicillin resistant staphylococcus aureus - MIC*    CLINDAMYCIN <=0.25 SENSITIVE Sensitive     ERYTHROMYCIN >=8 RESISTANT Resistant     GENTAMICIN <=0.5 SENSITIVE Sensitive     LEVOFLOXACIN 4 INTERMEDIATE Intermediate     OXACILLIN >=4 RESISTANT Resistant     PENICILLIN >=0.5 RESISTANT Resistant     RIFAMPIN <=0.5 SENSITIVE Sensitive     TRIMETH/SULFA <=10 SENSITIVE Sensitive     VANCOMYCIN 1 SENSITIVE Sensitive     TETRACYCLINE <=1 SENSITIVE Sensitive     * METHICILLIN RESISTANT STAPHYLOCOCCUS AUREUS  Urine culture     Status: None   Collection Time: 12/17/14 12:17 AM  Result Value Ref Range Status   Specimen  Description URINE, CATHETERIZED  Final   Special Requests IMMUNE:COMPROMISED  Final   Colony Count   Final    >=100,000 COLONIES/ML Performed at [facility]    Culture   Final    ENTEROCOCCUS SPECIES Performed at [facility]    Report Status 12/19/2014 FINAL  Final   Organism ID, Bacteria ENTEROCOCCUS SPECIES  Final      Susceptibility   Enterococcus species - MIC*    AMPICILLIN <=2 SENSITIVE Sensitive     LEVOFLOXACIN >=8 RESISTANT Resistant     NITROFURANTOIN <=16 SENSITIVE Sensitive     VANCOMYCIN 1 SENSITIVE Sensitive     TETRACYCLINE >=16 RESISTANT Resistant     * ENTEROCOCCUS SPECIES  MRSA PCR Screening     Status: Abnormal   Collection Time: 12/17/14  2:01 AM  Result Value Ref Range Status   MRSA by PCR POSITIVE (A) NEGATIVE Final    Comment:  The GeneXpert MRSA Assay (FDA approved for NASAL specimens only), is one component of a comprehensive MRSA colonization surveillance program. It is not intended to diagnose MRSA infection nor to guide or monitor treatment for MRSA infections. RESULT CALLED TO, READ BACK BY AND VERIFIED WITH: HANDY T AT 0507 ON [id number] BY FORSYTH K   Urine culture     Status: None   Collection Time: 12/17/14  5:43 AM  Result Value Ref Range Status   Specimen Description URINE, CLEAN CATCH  Final   Special Requests NONE  Final   Colony Count NO GROWTH Performed at [facility]   Final   Culture NO GROWTH Performed at [facility]   Final   Report Status 12/18/2014 FINAL  Final  Clostridium Difficile by PCR     Status: None   Collection Time: 12/17/14  6:53 AM  Result Value Ref Range Status   C difficile by pcr NEGATIVE NEGATIVE Final  Stool culture     Status: None (Preliminary result)   Collection Time: 12/17/14  6:53 AM  Result Value Ref Range Status   Specimen Description STOOL  Final   Special Requests IMMUNE:COMPROMISED  Final   Culture   Final    ABUNDANT YEAST NO SUSPICIOUS COLONIES,  CONTINUING TO HOLD Note: REDUCED NORMAL FLORA PRESENT Performed at [facility]    Report Status PENDING  Incomplete  Culture, sputum-assessment     Status: None   Collection Time: 12/17/14 11:25 AM  Result Value Ref Range Status   Specimen Description SPUTUM EXPECTORATED  Final   Special Requests NONE  Final   Sputum evaluation   Final    THIS SPECIMEN IS ACCEPTABLE. RESPIRATORY CULTURE REPORT TO FOLLOW. Performed at [facility]    Report Status 12/17/2014 FINAL  Final  Culture, respiratory (NON-Expectorated)     Status: None   Collection Time: 12/17/14 11:25 AM  Result Value Ref Range Status   Specimen Description SPUTUM EXPECTORATED  Final   Special Requests NONE  Final   Gram Stain   Final    RARE WBC PRESENT, PREDOMINANTLY PMN FEW SQUAMOUS EPITHELIAL CELLS PRESENT FEW GRAM POSITIVE COCCI IN PAIRS RARE GRAM NEGATIVE RODS RARE GRAM POSITIVE RODS    Culture   Final    NORMAL OROPHARYNGEAL FLORA Performed at [facility]    Report Status 12/20/2014 FINAL  Final     Studies: No results found.  Scheduled Meds: . Chlorhexidine Gluconate Cloth  6 each Topical Q0600  . emtricitabine  200 mg Oral Once per day on Mon Fri  . etravirine  200 mg Oral Q12H  . heparin subcutaneous  5,000 Units Subcutaneous 3 times per day  . mupirocin ointment  1 application Nasal BID  . raltegravir  400 mg Oral BID  . sodium chloride  3 mL Intravenous Q12H  . sodium chloride  3 mL Intravenous Q12H  . [START ON 12/21/2014] tenofovir  300 mg Oral Q Mon  . 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  Blood cultures became  positive for MRSA again during this hospitalization. Source of current bacteremia is likely from a vegetation at the tip of the HD catheter in the right atrium, per echo 12/19/14. Culture of catheter tip sent and is pending.  This catheter was removed, by Dr. Lovell Sheehan on 3/5. TEE may not be needed, will discuss further with ID. The patient is also status post eft upper extremity AV fistula placed at [facility] 1//12 2016 by Dr. Fuller Canada. Left-sided replaced hemodialysis catheter was inserted on 09/15/2014 at [facility], following the removal of the previous right sided HD catheter. -Briefly discussed patient with his ID physician, Dr. Janora Norlander on 3/3. -ID Dr. Luciana Axe (via each chart) recommended removal of the HD catheter and a catheter holiday before replacing. -Patient remains on vancomycin. Patient will need 6 weeks of IV antibiotics. Zosyn was discontinued.  -Patient has a history of MRSA bacteremia with sepsis 08/2014 secondary a vegetation in the SVC associated with the previous dialysis catheter. It was removed and the patient was treated with a total of 6 weeks of IV vancomycin. Repeat blood cultures prior to previous hospital discharge were negative.  Severe systolic left ventricular dysfunction. The patient's ejection fraction per 2-D echocardiogram has decreased to 20%. TEE 11/ 2015 revealed ejection fraction of 45%. Patient's blood pressure is on the lower side of normal. Since he is being dialyzed on a regular basis, will start a small dose of lisinopril; the patient may need a beta blocker or Aldactone. We'll ask cardiology to weigh in. ? If patient needs a cardiac catheterization following treatment of bacteremia.  Pyrexia, likely secondary to #1. -Urine culture-growing enterococcus. -Sputum culture normal flora 1 -C. difficile PCR was negative. Stool and sputum culture pending. -Influenza panel by PCR  was negative. -Vomiting and diarrhea have subsided.  HIV infection. -Per chart review, his CD4 count was 216 and his viral load was undetectable on 10/22/14. -We'll continue anti-retroviral medications.  End-stage renal disease. -Hemodialysis per nephrology team.  Chronic macrocytic anemia and thrombocytopenia. -Both likely secondary to HIV infection and/or antiretroviral medications. - vitamin B12 level and TSH for completion of workup; TSH is within normal limits.  Tobacco abuse. The patient was advised to stop smoking. We'll order when necessary Xopenex for occasional wheezes. Patient declined nicotine patch.    Time spent: 25 minutes    [facility]  [facility] Pager [phone]. If 7PM-7AM, please contact night-coverage at [email or web address], password [facility] 12/20/2014, 2:18 PM  LOS: 3 days

## 2014-12-21 ENCOUNTER — Encounter (HOSPITAL_COMMUNITY)
Admission: EM | Disposition: A | Discharge status: Home / Self Care | Payer: Self-pay | Source: Home / Self Care | Attending: Internal Medicine

## 2014-12-21 LAB — RESPIRATORY VIRUS PANEL
ADENOVIRUS: NEGATIVE
INFLUENZA A: NEGATIVE
Influenza B: NEGATIVE
Metapneumovirus: NEGATIVE
PARAINFLUENZA 3 A: NEGATIVE
Parainfluenza 1: NEGATIVE
Parainfluenza 2: NEGATIVE
RESPIRATORY SYNCYTIAL VIRUS A: NEGATIVE
RESPIRATORY SYNCYTIAL VIRUS B: NEGATIVE
Rhinovirus: NEGATIVE

## 2014-12-21 LAB — STOOL CULTURE

## 2014-12-21 LAB — BASIC METABOLIC PANEL
Anion gap: 12 (ref 5–15)
BUN: 60 mg/dL — AB (ref 6–23)
CO2: 25 mmol/L (ref 19–32)
CREATININE: 11.77 mg/dL — AB (ref 0.50–1.35)
Calcium: 7.5 mg/dL — ABNORMAL LOW (ref 8.4–10.5)
Chloride: 102 mmol/L (ref 96–112)
GFR calc Af Amer: 6 mL/min — ABNORMAL LOW (ref >90)
GFR calc non Af Amer: 5 mL/min — ABNORMAL LOW (ref >90)
Glucose, Bld: 102 mg/dL — ABNORMAL HIGH (ref 70–99)
POTASSIUM: 4 mmol/L (ref 3.5–5.1)
Sodium: 139 mmol/L (ref 135–145)

## 2014-12-21 LAB — PHOSPHORUS: PHOSPHORUS: 5.9 mg/dL — AB (ref 2.3–4.6)

## 2014-12-21 SURGERY — ECHOCARDIOGRAM, TRANSESOPHAGEAL
Anesthesia: Monitor Anesthesia Care

## 2014-12-21 MED ORDER — VANCOMYCIN HCL IN DEXTROSE 1-5 GM/200ML-% IV SOLN: 1000.0000 mg | INTRAVENOUS | Status: DC

## 2014-12-21 MED ORDER — SODIUM CHLORIDE 0.9 % IV SOLN: 100.0000 mL | INTRAVENOUS | Status: DC | PRN

## 2014-12-21 MED ORDER — CARVEDILOL 3.125 MG PO TABS
3.1250 mg | ORAL_TABLET | Freq: Two times a day (BID) | ORAL | Status: DC
  Administered 2014-12-21: 3.125 mg via ORAL
  Filled 2014-12-21: qty 1

## 2014-12-21 MED ORDER — LISINOPRIL 2.5 MG PO TABS: 1.2500 mg | ORAL_TABLET | Freq: Every day | ORAL | Status: DC

## 2014-12-21 MED ORDER — ALTEPLASE 2 MG IJ SOLR
2.0000 mg | Freq: Once | INTRAMUSCULAR | Status: DC | PRN
  Filled 2014-12-21: qty 2

## 2014-12-21 MED ORDER — CARVEDILOL 3.125 MG PO TABS: 3.1250 mg | ORAL_TABLET | Freq: Two times a day (BID) | ORAL | Status: DC

## 2014-12-21 NOTE — Discharge Summary (Signed)
Physician Discharge Summary  Dave Estrada ZOX:[id number] DOB: 02-01-1991 DOA: 12/16/2014  PCP: No PCP Per Patient  Admit date: 12/16/2014 Discharge date: 12/21/2014  Time spent: Greater than 30 minutes  Recommendations for Outpatient Follow-up:  1. The patient will need IV vancomycin for 6 weeks; started in the hospital on 12/16/14. Anticipate date of discontinuation on 01/27/15. 2. Second set of blood cultures reordered prior to discharge on 12/21/2014.  Discharge Diagnoses:  1. Recurrent MRSA bacteremia. Source: HD catheter.  2. 2-D echocardiogram revealing the right atrium with a catheter which routine into the right atrium with a small mobile mass at the tip. --- Status post removal of left chest wall HD catheter on 12/19/14. 3. History of MRSA bacteremia with sepsis November 2015 secondary to vegetation in the SVC associated with the previous right chest wall dialysis catheter which was removed. Patient completed 6 weeks of IV vancomycin. 4. Severe systolic dysfunction 2-D echocardiogram revealing an ejection fraction of 20%; decreased from 45% per TEE 08/2014. 5. Status post left upper extremity AV fistula placement at [facility] on 10/27/2014 by Dr. Fuller Canada. 6. HIV infection. 7. End-stage renal disease-on hemodialysis. 8. Chronic macrocytic anemia and thrombocytopenia. 9. Tobacco abuse. The patient was advised to stop smoking. 10. Chronic asthma. 11. Presenting symptoms with nausea, vomiting, and diarrhea. Resolved.    Discharge Condition: Improved.  Diet recommendation: Heart healthy.  Filed Weights   12/20/14 0612 12/21/14 0551 12/21/14 1445  Weight: 52.8 kg (116 lb 6.5 oz) 52.4 kg (115 lb 8.3 oz) 52.4 kg (115 lb 8.3 oz)    History of present illness:  The patient is a 24 year old man with a history of perinatal HIV, end-stage renal disease-on hemodialysis, and cardiomyopathy. He was recently hospitalized 08/2014 for MRSA bacteremia and sepsis associated with a vegetation in the SVC  associated with the previous dialysis catheter. He was treated with 6 weeks of IV vancomycin. He also recently had an AV fistula placed on 10/27/2014 at [facility]. He presented to the emergency department on 12/16/14 with a chief complaint of a persistent fever; associated nausea, vomiting, and diarrhea. He was seen a couple days prior in the ED and was treated with Cipro for a presumed UTI. In the ED, his fever was 103F. He was moderately tachycardic, but his blood pressure was within normal limits. He was admitted for further evaluation and management.  Hospital Course:   1.Recurrent MRSA bacteremia.  The patient was started on vancomycin and Zosyn empirically. Gentle IV fluids and supportive treatment were given. Blood cultures were ordered. They became positive for MRSA again during this hospitalization. Gen. surgery was consulted to remove the hemodialysis catheter that was placed in December 2015. This was recommended per brief conversation with infectious diseases physician, Dr. Luciana Axe. The patient's ID physician, Dr. Janora Norlander was aware of the patient's hospitalization. TEE was recommended, but cardiology ordered a 2-D echocardiogram first. It revealed a likely vegetation at the tip of the HD catheter in the right atrium. This was the likely the source of the recurrent MRSA bacteremia. TEE was canceled. The patient was also status post eft upper extremity AV fistula placed at [facility] 1//12 2016 by Dr. Fuller Canada. Nephrologist, Dr. Kristian Covey discussed potentially using the AV fistula for hemodialysis with Dr. Fuller Canada; she agreed that it would be better to try the AV fistula rather than having another HD catheter placed. The patient underwent hemodialysis successfully on 12/21/14 with the AV fistula. ID recommended 6 weeks of IV vancomycin. It will be given during dialysis. He was  started on vancomycin during the hospital course on 12/16/14 and the anticipated stop date for IV vancomycin is 01/27/15.  Severe  systolic left ventricular dysfunction. The patient's ejection fraction per 2-D echocardiogram had decreased to 20%. TEE in 11/ 2015 revealed ejection fraction of 45%. Patient's blood pressure was on the lower side of normal. Lisinopril was started at 1.25 mg daily, but this dose will likely need to be increased if his blood pressure tolerates it. Cardiology was consulted and added Coreg. The patient will follow-up with cardiology in the outpatient setting for further evaluation and management.  Pyrexia, likely secondary to #1. Urine culture grew out enterococcus, but this was likely a colonization. Sputum culture revealed normal flora 1 C. difficile PCR was negative. Stool and sputum culture were negative to date. Influenza panel by PCR was negative. His vomiting and diarrhea have subsided.  HIV infection. Per chart review, his CD4 count was 216 and his viral load was undetectable on 10/22/14. His anti-retroviral medications were continued.  End-stage renal disease. He underwent hemodialysis during hospitalization.  Chronic macrocytic anemia and thrombocytopenia. Both were likely secondary to HIV infection and/or antiretroviral medications. His vitamin B12 level and TSH were within normal limits.  Tobacco abuse. The patient was advised to stop smoking. As needed Xopenex were given.  Urine culture positive for enterococcus.     Procedures:  2-D echocardiogram: - Left ventricle: Systolic function is severely reduced. Estimated EF 20%. Diffuse hypokinesis seen. The cavity size was normal. Features are consistent with a pseudonormal left ventricular filling pattern, with concomitant abnormal relaxation and increased filling pressure (grade 2 diastolic dysfunction). Borderline left ventricular hypertrophy. - Mitral valve: Mildly thickened leaflets . There was mild regurgitation. - Right ventricle: Systolic function was mildly reduced. - Right atrium: Catheter  protruding into right atrium. There is a small mobile mass at the tip. A vegetation cannot be excluded. Impressions: - Left ventricular systolic function is now severely reduced, EF 20%. There may be a vegetation attached to the tip of a catheter which protrudes into the right atrium.  Removal of permacath/HD catheter, 12/19/14  Hemodialysis    Consultations:  Curbside ID conversation.  Nephrology  Cardiology  Gen. surgery  Discharge Exam: Filed Vitals:   12/21/14 1815  BP: 118/80  Pulse: 87  Temp:   Resp:    temperature 97.9. respiratory rate 16. Oxygen saturation 100% on room air.   General: Small framed 24 year old African-American man sitting up in bed, in no acute distress.  Cardiovascular: S1, S2, with borderline tachycardia. No murmurs rubs or gallops.  Respiratory: Clear to auscultation; breathing nonlabored.  Abdomen: Positive bowel sounds, soft, nontender, nondistended.  Musculoskeletal: No acute hot red joints. No pedal edema.  Neurologic/psychiatric: He has a flat affect. He is alert and oriented 3. Speech is clear. Cranial nerves are intact.      Discharge Instructions   Discharge Instructions    Diet - low sodium heart healthy    Complete by:  As directed      Discharge instructions    Complete by:  As directed   Take new medicines as prescribed. Follow-up with your infection doctor. Call to make an appointment to follow-up with your new heart doctor. Continue hemodialysis.     Increase activity slowly    Complete by:  As directed           Current Discharge Medication List    START taking these medications   Details  carvedilol (COREG) 3.125 MG tablet Take 1 tablet (3.125  mg total) by mouth 2 (two) times daily with a meal. New medication for your heart. Qty: 60 tablet, Refills: 2    lisinopril (PRINIVIL,ZESTRIL) 2.5 MG tablet Take 0.5 tablets (1.25 mg total) by mouth daily. New medication for your heart. Qty: 30 tablet,  Refills: 2    vancomycin (VANCOCIN) 1 GM/200ML SOLN Inject 200 mLs (1,000 mg total) into the vein every Monday, Wednesday, and Friday with hemodialysis. For 6 more weeks. IV vancomycin started 12/16/2014. Anticipated end date 01/27/2015. Qty: 4000 mL      CONTINUE these medications which have NOT CHANGED   Details  albuterol (PROVENTIL HFA;VENTOLIN HFA) 108 (90 BASE) MCG/ACT inhaler Inhale 1-2 puffs into the lungs every 6 (six) hours as needed for wheezing or shortness of breath.    emtricitabine (EMTRIVA) 200 MG capsule Take 200 mg by mouth 2 (two) times a week. Taken after dialysis on Mondays and Fridays    etravirine (INTELENCE) 100 MG tablet Take 200 mg by mouth every 12 (twelve) hours.    ibuprofen (ADVIL,MOTRIN) 200 MG tablet Take 200 mg by mouth every 6 (six) hours as needed for fever or moderate pain.    raltegravir (ISENTRESS) 400 MG tablet Take 400 mg by mouth 2 (two) times daily.    tenofovir (VIREAD) 300 MG tablet Take 300 mg by mouth every Monday. To be taken on Mondays after dialysis      STOP taking these medications     ciprofloxacin (CIPRO) 250 MG tablet        Allergies  Allergen Reactions  . Other Anaphylaxis  . Aspirin Other (See Comments)    Reaction is unknown   Follow-up Information    Follow up with Oretha Ellis. Schedule an appointment as soon as possible for a visit in 1 week.   Specialty:  Infectious Diseases   Contact information:   [address] [facility] [address] [place] [address] [phone]       Follow up with Laqueta Linden, MD.   Specialty:  Cardiology   Why:  Cardiologist. Call for follow up to be seen in 1-2 weeks   Contact information:   [address] [place] [address] [phone]        The results of significant diagnostics from this hospitalization (including imaging, microbiology, ancillary and laboratory) are listed below for reference.    Significant Diagnostic Studies: Dg Chest 2 View  12/17/2014    CLINICAL DATA:  Acute onset of fever, productive cough and substernal chest pain. Initial encounter.  EXAM: CHEST  2 VIEW  COMPARISON:  Chest radiograph from 09/15/2014  FINDINGS: The lungs are well-aerated and clear. There is no evidence of focal opacification, pleural effusion or pneumothorax.  The heart is normal in size; the mediastinal contour is within normal limits. No acute osseous abnormalities are seen. A left-sided dual-lumen catheter is noted ending within the right atrium.  IMPRESSION: No acute cardiopulmonary process seen.   Electronically Signed   By: [name] M.D.   On: 12/17/2014 00:21    Microbiology: Recent Results (from the past 240 hour(s))  Culture, blood (routine x 2)     Status: None   Collection Time: 12/16/14 11:34 PM  Result Value Ref Range Status   Specimen Description BLOOD RIGHT ARM  Final   Special Requests BOTTLES DRAWN AEROBIC AND ANAEROBIC 10CC EACH  Final   Culture   Final    STAPHYLOCOCCUS AUREUS Note: SUSCEPTIBILITIES PERFORMED ON PREVIOUS CULTURE WITHIN THE LAST 5 DAYS. Note:  Gram Stain Report Called to,Read Back By and Verified With: R CHAPPELLE 12/17/14 1335 BY Luetta Nutting M Performed at [facility]    Report Status 12/19/2014 FINAL  Final  Culture, blood (routine x 2)     Status: None   Collection Time: 12/16/14 11:34 PM  Result Value Ref Range Status   Specimen Description BLOOD RIGHT ANTECUBITAL  Final   Special Requests   Final    BOTTLES DRAWN AEROBIC AND ANAEROBIC AEB=10CC ANA=8CC   Culture   Final    METHICILLIN RESISTANT STAPHYLOCOCCUS AUREUS Note: RIFAMPIN AND GENTAMICIN SHOULD NOT BE USED AS SINGLE DRUGS FOR TREATMENT OF STAPH INFECTIONS. CRITICAL RESULT CALLED TO, READ BACK BY AND VERIFIED WITH: CHRISTINA KNIGHT 12/18/14 1525 BY [name] This organism DOES NOT demonstrate inducible  Clindamycin resistance in vitro. Note: Gram Stain Report Called to,Read Back By and Verified With: R CHAPPELLE 12/17/14 1335 BY Luetta Nutting M Performed at  [facility]    Report Status 12/19/2014 FINAL  Final   Organism ID, Bacteria METHICILLIN RESISTANT STAPHYLOCOCCUS AUREUS  Final      Susceptibility   Methicillin resistant staphylococcus aureus - MIC*    CLINDAMYCIN <=0.25 SENSITIVE Sensitive     ERYTHROMYCIN >=8 RESISTANT Resistant     GENTAMICIN <=0.5 SENSITIVE Sensitive     LEVOFLOXACIN 4 INTERMEDIATE Intermediate     OXACILLIN >=4 RESISTANT Resistant     PENICILLIN >=0.5 RESISTANT Resistant     RIFAMPIN <=0.5 SENSITIVE Sensitive     TRIMETH/SULFA <=10 SENSITIVE Sensitive     VANCOMYCIN 1 SENSITIVE Sensitive     TETRACYCLINE <=1 SENSITIVE Sensitive     * METHICILLIN RESISTANT STAPHYLOCOCCUS AUREUS  Urine culture     Status: None   Collection Time: 12/17/14 12:17 AM  Result Value Ref Range Status   Specimen Description URINE, CATHETERIZED  Final   Special Requests IMMUNE:COMPROMISED  Final   Colony Count   Final    >=100,000 COLONIES/ML Performed at [facility]    Culture   Final    ENTEROCOCCUS SPECIES Performed at [facility]    Report Status 12/19/2014 FINAL  Final   Organism ID, Bacteria ENTEROCOCCUS SPECIES  Final      Susceptibility   Enterococcus species - MIC*    AMPICILLIN <=2 SENSITIVE Sensitive     LEVOFLOXACIN >=8 RESISTANT Resistant     NITROFURANTOIN <=16 SENSITIVE Sensitive     VANCOMYCIN 1 SENSITIVE Sensitive     TETRACYCLINE >=16 RESISTANT Resistant     * ENTEROCOCCUS SPECIES  Respiratory virus panel (routine influenza)     Status: None   Collection Time: 12/17/14  1:07 AM  Result Value Ref Range Status   Respiratory Syncytial Virus A Negative Negative Final   Respiratory Syncytial Virus B Negative Negative Final   Influenza A Negative Negative Final   Influenza B Negative Negative Final   Parainfluenza 1 Negative Negative Final   Parainfluenza 2 Negative Negative Final   Parainfluenza 3 Negative Negative Final   Metapneumovirus Negative Negative Final   Rhinovirus  Negative Negative Final   Adenovirus Negative Negative Final    Comment: (NOTE) Performed At: [facility] [address] [place], [place] [id number] Mila Homer MD WU:[id number]   MRSA PCR Screening     Status: Abnormal   Collection Time: 12/17/14  2:01 AM  Result Value Ref Range Status   MRSA by PCR POSITIVE (A) NEGATIVE Final    Comment:        The GeneXpert MRSA Assay (FDA  approved for NASAL specimens only), is one component of a comprehensive MRSA colonization surveillance program. It is not intended to diagnose MRSA infection nor to guide or monitor treatment for MRSA infections. RESULT CALLED TO, READ BACK BY AND VERIFIED WITH: HANDY T AT 0507 ON [id number] BY FORSYTH K   Urine culture     Status: None   Collection Time: 12/17/14  5:43 AM  Result Value Ref Range Status   Specimen Description URINE, CLEAN CATCH  Final   Special Requests NONE  Final   Colony Count NO GROWTH Performed at [facility]   Final   Culture NO GROWTH Performed at [facility]   Final   Report Status 12/18/2014 FINAL  Final  Clostridium Difficile by PCR     Status: None   Collection Time: 12/17/14  6:53 AM  Result Value Ref Range Status   C difficile by pcr NEGATIVE NEGATIVE Final  Stool culture     Status: None   Collection Time: 12/17/14  6:53 AM  Result Value Ref Range Status   Specimen Description STOOL  Final   Special Requests IMMUNE:COMPROMISED  Final   Culture   Final    ABUNDANT YEAST CONSISTENT WITH CANDIDA SPECIES NO SALMONELLA, SHIGELLA, CAMPYLOBACTER, YERSINIA, OR E.COLI 0157:[id number] ISOLATED Note: REDUCED NORMAL FLORA PRESENT Performed at [facility]    Report Status 12/21/2014 FINAL  Final  Culture, sputum-assessment     Status: None   Collection Time: 12/17/14 11:25 AM  Result Value Ref Range Status   Specimen Description SPUTUM EXPECTORATED  Final   Special Requests NONE  Final   Sputum evaluation   Final    THIS SPECIMEN IS  ACCEPTABLE. RESPIRATORY CULTURE REPORT TO FOLLOW. Performed at [facility]    Report Status 12/17/2014 FINAL  Final  Culture, respiratory (NON-Expectorated)     Status: None   Collection Time: 12/17/14 11:25 AM  Result Value Ref Range Status   Specimen Description SPUTUM EXPECTORATED  Final   Special Requests NONE  Final   Gram Stain   Final    RARE WBC PRESENT, PREDOMINANTLY PMN FEW SQUAMOUS EPITHELIAL CELLS PRESENT FEW GRAM POSITIVE COCCI IN PAIRS RARE GRAM NEGATIVE RODS RARE GRAM POSITIVE RODS    Culture   Final    NORMAL OROPHARYNGEAL FLORA Performed at [facility]    Report Status 12/20/2014 FINAL  Final  Cath Tip Culture     Status: None (Preliminary result)   Collection Time: 12/19/14 11:45 AM  Result Value Ref Range Status   Specimen Description CATH TIP  Final   Special Requests NONE  Final   Culture   Final    Culture reincubated for better growth Performed at [facility]    Report Status PENDING  Incomplete  Culture, blood (routine x 2)     Status: None (Preliminary result)   Collection Time: 12/21/14  9:11 AM  Result Value Ref Range Status   Specimen Description BLOOD  Final   Special Requests Immunocompromised  Final   Culture NO GROWTH <24 HRS  Final   Report Status PENDING  Incomplete  Culture, blood (routine x 2)     Status: None (Preliminary result)   Collection Time: 12/21/14  9:20 AM  Result Value Ref Range Status   Specimen Description BLOOD  Final   Special Requests Immunocompromised  Final   Culture NO GROWTH <24 HRS  Final   Report Status PENDING  Incomplete     Labs: Basic Metabolic Panel:  Recent Labs Lab 12/16/14 2256 12/17/14 0628 12/18/14 0629 12/20/14 0634 12/21/14 0557  NA 133* 133* 136 137 139  K 3.4* 3.7 3.7 3.7 4.0  CL 96 97 101 99 102  CO2 25 25 24 27 25   GLUCOSE 134* 134* 105* 103* 102*  BUN 40* 42* 49* 46* 60*  CREATININE 8.07* 8.34* 9.10* 10.60* 11.77*  CALCIUM 7.9* 7.6* 8.0* 8.0* 7.5*   PHOS  --   --   --   --  5.9*   Liver Function Tests:  Recent Labs Lab 12/16/14 2256  AST 23  ALT 15  ALKPHOS 73  BILITOT 0.7  PROT 7.8  ALBUMIN 3.7   No results for input(s): LIPASE, AMYLASE in the last 168 hours. No results for input(s): AMMONIA in the last 168 hours. CBC:  Recent Labs Lab 12/16/14 2256 12/17/14 0628 12/18/14 0629 12/20/14 0634  WBC 7.7 7.2 5.9 5.7  NEUTROABS 6.6  --   --   --   HGB 11.4* 10.1* 10.3* 12.0*  HCT 34.1* 30.6* 30.3* 36.6*  MCV 101.5* 102.0* 101.7* 102.2*  PLT 157 137* 131* 188   Cardiac Enzymes:  Recent Labs Lab 12/16/14 2324  TROPONINI <0.03   BNP: BNP (last 3 results) No results for input(s): BNP in the last 8760 hours.  ProBNP (last 3 results) No results for input(s): PROBNP in the last 8760 hours.  CBG: No results for input(s): GLUCAP in the last 168 hours.     Signed:  Shuntia Exton  [facility] 12/21/2014, 6:58 PM

## 2014-12-21 NOTE — Procedures (Signed)
HEMODIALYSIS TREATMENT NOTE:  3.5 hour heparin-free dialysis completed via left upper arm AVF.  First cannulation of very tortuous fistula with visible collateral veins.  Limited areas for cannulation; recommend buttonhole method until further AVF arterialization.  Goal met:  1.7 liters removed without interruption in ultrafiltration.  Treatment was interrupted due to blood clot formation in venous chamber despite circuit being flushed every 20 minutes.  Pt states this is often a problem when no heparin is used.  No blood loss. Hemostasis achieved within 12 minutes.  Report called to Renee Chappelle, RN.   L. , RN, CDN 

## 2014-12-21 NOTE — Progress Notes (Signed)
ANTIBIOTIC CONSULT NOTE  Pharmacy Consult for Vancomycin Indication: MRSA bacteremia.  Allergies  Allergen Reactions  . Other Anaphylaxis  . Aspirin Other (See Comments)    Reaction is unknown   Patient Measurements: Height: 5\' 5"  (165.1 cm) Weight: 115 lb 8.3 oz (52.4 kg) IBW/kg (Calculated) : 61.5  Vital Signs: Temp: 97.7 F (36.5 C) (03/07 0551) Temp Source: Oral (03/07 0551) BP: 111/70 mmHg (03/07 0551) Pulse Rate: 80 (03/07 0551) Intake/Output from previous day: 03/06 0701 - 03/07 0700 In: 483 [P.O.:480; I.V.:3] Out: 200 [Urine:200] Intake/Output from this shift: Total I/O In: 120 [P.O.:120] Out: -   Labs:  Recent Labs  12/20/14 0634 12/21/14 0557  WBC 5.7  --   HGB 12.0*  --   PLT 188  --   CREATININE 10.60* 11.77*   No results for input(s): VANCOTROUGH, VANCOPEAK, VANCORANDOM, GENTTROUGH, GENTPEAK, GENTRANDOM, TOBRATROUGH, TOBRAPEAK, TOBRARND, AMIKACINPEAK, AMIKACINTROU, AMIKACIN in the last 72 hours.   Microbiology: Recent Results (from the past 720 hour(s))  Culture, blood (routine x 2)     Status: None   Collection Time: 12/16/14 11:34 PM  Result Value Ref Range Status   Specimen Description BLOOD RIGHT ARM  Final   Special Requests BOTTLES DRAWN AEROBIC AND ANAEROBIC 10CC EACH  Final   Culture   Final    STAPHYLOCOCCUS AUREUS Note: SUSCEPTIBILITIES PERFORMED ON PREVIOUS CULTURE WITHIN THE LAST 5 DAYS. Note: Gram Stain Report Called to,Read Back By and Verified With: R CHAPPELLE 12/17/14 1335 BY Luetta Nutting M Performed at [facility]    Report Status 12/19/2014 FINAL  Final  Culture, blood (routine x 2)     Status: None   Collection Time: 12/16/14 11:34 PM  Result Value Ref Range Status   Specimen Description BLOOD RIGHT ANTECUBITAL  Final   Special Requests   Final    BOTTLES DRAWN AEROBIC AND ANAEROBIC AEB=10CC ANA=8CC   Culture   Final    METHICILLIN RESISTANT STAPHYLOCOCCUS AUREUS Note: RIFAMPIN AND GENTAMICIN SHOULD NOT BE USED AS  SINGLE DRUGS FOR TREATMENT OF STAPH INFECTIONS. CRITICAL RESULT CALLED TO, READ BACK BY AND VERIFIED WITH: CHRISTINA KNIGHT 12/18/14 1525 BY [name] This organism DOES NOT demonstrate inducible  Clindamycin resistance in vitro. Note: Gram Stain Report Called to,Read Back By and Verified With: R CHAPPELLE 12/17/14 1335 BY Luetta Nutting M Performed at [facility]    Report Status 12/19/2014 FINAL  Final   Organism ID, Bacteria METHICILLIN RESISTANT STAPHYLOCOCCUS AUREUS  Final      Susceptibility   Methicillin resistant staphylococcus aureus - MIC*    CLINDAMYCIN <=0.25 SENSITIVE Sensitive     ERYTHROMYCIN >=8 RESISTANT Resistant     GENTAMICIN <=0.5 SENSITIVE Sensitive     LEVOFLOXACIN 4 INTERMEDIATE Intermediate     OXACILLIN >=4 RESISTANT Resistant     PENICILLIN >=0.5 RESISTANT Resistant     RIFAMPIN <=0.5 SENSITIVE Sensitive     TRIMETH/SULFA <=10 SENSITIVE Sensitive     VANCOMYCIN 1 SENSITIVE Sensitive     TETRACYCLINE <=1 SENSITIVE Sensitive     * METHICILLIN RESISTANT STAPHYLOCOCCUS AUREUS  Urine culture     Status: None   Collection Time: 12/17/14 12:17 AM  Result Value Ref Range Status   Specimen Description URINE, CATHETERIZED  Final   Special Requests IMMUNE:COMPROMISED  Final   Colony Count   Final    >=100,000 COLONIES/ML Performed at [facility]    Culture   Final    ENTEROCOCCUS SPECIES Performed at [facility]    Report Status  12/19/2014 FINAL  Final   Organism ID, Bacteria ENTEROCOCCUS SPECIES  Final      Susceptibility   Enterococcus species - MIC*    AMPICILLIN <=2 SENSITIVE Sensitive     LEVOFLOXACIN >=8 RESISTANT Resistant     NITROFURANTOIN <=16 SENSITIVE Sensitive     VANCOMYCIN 1 SENSITIVE Sensitive     TETRACYCLINE >=16 RESISTANT Resistant     * ENTEROCOCCUS SPECIES  MRSA PCR Screening     Status: Abnormal   Collection Time: 12/17/14  2:01 AM  Result Value Ref Range Status   MRSA by PCR POSITIVE (A) NEGATIVE Final    Comment:         The GeneXpert MRSA Assay (FDA approved for NASAL specimens only), is one component of a comprehensive MRSA colonization surveillance program. It is not intended to diagnose MRSA infection nor to guide or monitor treatment for MRSA infections. RESULT CALLED TO, READ BACK BY AND VERIFIED WITH: HANDY T AT 0507 ON [id number] BY FORSYTH K   Urine culture     Status: None   Collection Time: 12/17/14  5:43 AM  Result Value Ref Range Status   Specimen Description URINE, CLEAN CATCH  Final   Special Requests NONE  Final   Colony Count NO GROWTH Performed at [facility]   Final   Culture NO GROWTH Performed at [facility]   Final   Report Status 12/18/2014 FINAL  Final  Clostridium Difficile by PCR     Status: None   Collection Time: 12/17/14  6:53 AM  Result Value Ref Range Status   C difficile by pcr NEGATIVE NEGATIVE Final  Stool culture     Status: None   Collection Time: 12/17/14  6:53 AM  Result Value Ref Range Status   Specimen Description STOOL  Final   Special Requests IMMUNE:COMPROMISED  Final   Culture   Final    ABUNDANT YEAST CONSISTENT WITH CANDIDA SPECIES NO SALMONELLA, SHIGELLA, CAMPYLOBACTER, YERSINIA, OR E.COLI 0157:H7 ISOLATED Note: REDUCED NORMAL FLORA PRESENT Performed at [facility]    Report Status 12/21/2014 FINAL  Final  Culture, sputum-assessment     Status: None   Collection Time: 12/17/14 11:25 AM  Result Value Ref Range Status   Specimen Description SPUTUM EXPECTORATED  Final   Special Requests NONE  Final   Sputum evaluation   Final    THIS SPECIMEN IS ACCEPTABLE. RESPIRATORY CULTURE REPORT TO FOLLOW. Performed at [facility]    Report Status 12/17/2014 FINAL  Final  Culture, respiratory (NON-Expectorated)     Status: None   Collection Time: 12/17/14 11:25 AM  Result Value Ref Range Status   Specimen Description SPUTUM EXPECTORATED  Final   Special Requests NONE  Final   Gram Stain   Final    RARE WBC  PRESENT, PREDOMINANTLY PMN FEW SQUAMOUS EPITHELIAL CELLS PRESENT FEW GRAM POSITIVE COCCI IN PAIRS RARE GRAM NEGATIVE RODS RARE GRAM POSITIVE RODS    Culture   Final    NORMAL OROPHARYNGEAL FLORA Performed at [facility]    Report Status 12/20/2014 FINAL  Final  Culture, blood (routine x 2)     Status: None (Preliminary result)   Collection Time: 12/21/14  9:11 AM  Result Value Ref Range Status   Specimen Description BLOOD  Final   Special Requests Immunocompromised  Final   Culture NO GROWTH <24 HRS  Final   Report Status PENDING  Incomplete  Culture, blood (routine x 2)     Status: None (  Preliminary result)   Collection Time: 12/21/14  9:20 AM  Result Value Ref Range Status   Specimen Description BLOOD  Final   Special Requests Immunocompromised  Final   Culture NO GROWTH <24 HRS  Final   Report Status PENDING  Incomplete   Medical History: Past Medical History  Diagnosis Date  . HIV (human immunodeficiency virus infection)   . ESRD (end stage renal disease)   . Staphylococcus aureus bacteremia with sepsis 09/09/2014  . A-V fistula     Placed on 10/27/2014 at [facility]; for future HD use.   . Systolic dysfunction, left ventricle 12/20/2014    EF 20%    Medications:  Scheduled:  . carvedilol  3.125 mg Oral BID WC  . Chlorhexidine Gluconate Cloth  6 each Topical Q0600  . emtricitabine  200 mg Oral Once per day on Mon Fri  . etravirine  200 mg Oral Q12H  . heparin subcutaneous  5,000 Units Subcutaneous 3 times per day  . lisinopril  1.25 mg Oral Daily  . mupirocin ointment  1 application Nasal BID  . raltegravir  400 mg Oral BID  . sodium chloride  3 mL Intravenous Q12H  . sodium chloride  3 mL Intravenous Q12H  . tenofovir  300 mg Oral Q Mon  . vancomycin  1,000 mg Intravenous Q M,W,F-HD   Infusions:   PRN: sodium chloride, sodium chloride, sodium chloride, albuterol, feeding supplement (NEPRO CARB STEADY), heparin, heparin, heparin, ibuprofen, lidocaine (PF),  lidocaine-prilocaine, ondansetron **OR** ondansetron (ZOFRAN) IV, pentafluoroprop-tetrafluoroeth, sodium chloride   Assessment: 24 yo M with hx HIV and ESRD (M-W-F dialysis schedule) who is +MRSA bacteremia.  He previously treated for 6wk vancomycin for catheter related bacteremia/endocarditis in 2015.  Dialysis catheter was removed.  Cath tip cx and repeat blood cx pending.   Vancomycin 3/3>> Zosyn 3/3>>3/4  Goal of Therapy:  Pre-Hemodialysis Vancomycin level =15-25 mcg/ml  Plan:   Vancomycin  IV after each HD (MWF)  Check Vancomycin level at steady state - with dialysis on Wednesday  F/U repeat cx data  Duration of therapy per MD- anticipate 6 week course of Vancomycin  Dave Estrada 12/21/2014,11:04 AM

## 2014-12-21 NOTE — Progress Notes (Signed)
Consulting cardiologist: Dr. Prentice Docker  Seen for followup: Bacteremia, cardiomyopathy  Subjective:    Patient sitting in bed side chair. No specific complaints this morning. Ate breakfast.  Objective:   Temp:  [97.7 F (36.5 C)-98.3 F (36.8 C)] 97.7 F (36.5 C) (03/07 0551) Pulse Rate:  [80-95] 80 (03/07 0551) Resp:  [20] 20 (03/07 0551) BP: (99-123)/(61-70) 111/70 mmHg (03/07 0551) SpO2:  [100 %] 100 % (03/07 0551) Weight:  [115 lb 8.3 oz (52.4 kg)] 115 lb 8.3 oz (52.4 kg) (03/07 0551) Last BM Date: 12/20/14 (per patient)  Filed Weights   12/19/14 0649 12/20/14 0612 12/21/14 0551  Weight: 127 lb 6.8 oz (57.8 kg) 116 lb 6.5 oz (52.8 kg) 115 lb 8.3 oz (52.4 kg)    Intake/Output Summary (Last 24 hours) at 12/21/14 0847 Last data filed at 12/20/14 2249  Gross per 24 hour  Intake    483 ml  Output    200 ml  Net    283 ml    Telemetry: Sinus rhythm.  Exam:  General: Appears comfortable at rest.  Lungs: Clear, nonlabored.  Cardiac: Indistinct PMI, RRR without gallop.  Extremities: No pitting edema. AV fistula left arm with bruit and thrill.   Lab Results:  Basic Metabolic Panel:  Recent Labs Lab 12/18/14 0629 12/20/14 0634 12/21/14 0557  NA 136 137 139  K 3.7 3.7 4.0  CL 101 99 102  CO2 24 27 25   GLUCOSE 105* 103* 102*  BUN 49* 46* 60*  CREATININE 9.10* 10.60* 11.77*  CALCIUM 8.0* 8.0* 7.5*    CBC:  Recent Labs Lab 12/17/14 0628 12/18/14 0629 12/20/14 0634  WBC 7.2 5.9 5.7  HGB 10.1* 10.3* 12.0*  HCT 30.6* 30.3* 36.6*  MCV 102.0* 101.7* 102.2*  PLT 137* 131* 188    Cardiac Enzymes:  Recent Labs Lab 12/16/14 2324  TROPONINI <0.03    Echocardiogram 12/19/2014: Study Conclusions  - Left ventricle: Systolic function is severely reduced. Estimated EF 20%. Diffuse hypokinesis seen. The cavity size was normal. Features are consistent with a pseudonormal left ventricular filling pattern, with concomitant abnormal  relaxation and increased filling pressure (grade 2 diastolic dysfunction). Borderline left ventricular hypertrophy. - Mitral valve: Mildly thickened leaflets . There was mild regurgitation. - Right ventricle: Systolic function was mildly reduced. - Right atrium: Catheter protruding into right atrium. There is a small mobile mass at the tip. A vegetation cannot be excluded.  Impressions:  - Left ventricular systolic function is now severely reduced, EF 20%. There may be a vegetation attached to the tip of a catheter which protrudes into the right atrium.   Medications:   Scheduled Medications: . Chlorhexidine Gluconate Cloth  6 each Topical Q0600  . emtricitabine  200 mg Oral Once per day on Mon Fri  . etravirine  200 mg Oral Q12H  . heparin subcutaneous  5,000 Units Subcutaneous 3 times per day  . lisinopril  1.25 mg Oral Daily  . mupirocin ointment  1 application Nasal BID  . raltegravir  400 mg Oral BID  . sodium chloride  3 mL Intravenous Q12H  . sodium chloride  3 mL Intravenous Q12H  . tenofovir  300 mg Oral Q Mon  . vancomycin  1,000 mg Intravenous Q M,W,F-HD      PRN Medications:  sodium chloride, sodium chloride, sodium chloride, albuterol, feeding supplement (NEPRO CARB STEADY), heparin, heparin, heparin, ibuprofen, lidocaine (PF), lidocaine-prilocaine, ondansetron **OR** ondansetron (ZOFRAN) IV, pentafluoroprop-tetrafluoroeth, sodium chloride   Assessment:   1.  MRSA bacteremia, source is most likely hemodialysis catheter based on above echocardiogram findings. This catheter has been removed. Patient is being treated with IV vancomycin with plan for a 6 week course per infectious disease. He is currently afebrile and clinically stable. I discussed the case with Dr. Sherrie Mustache, at this point we will hold off on TEE as it is unlikely to change clinical course or plan at this point.  2. Cardiomyopathy, LVEF 20% with diffuse hypokinesis (previously 45%). Would  suspect a nonischemic etiology, possibly HIV related. Interestingly, patient tells me that he previously saw Dr. Earna Coder in [place], had a "heart attack" related to "stress" back in 2014. I do not have the details. Patient states that he was discharged from Dr. Albertina Senegal practice.  3. Chronic HIV disease, followed at [facility].  4. End-stage renal disease, on hemodialysis. Patient has a left arm AV fistula has not yet been accessed but appears to be fairly mature. Followed by Dr. Kristian Covey.   Plan/Discussion:    Discussed plan with patient. We will add low-dose Coreg 3.125 mg twice daily, patient was placed on very low-dose lisinopril by Dr. Sherrie Mustache yesterday. Patient appears to be nearing discharge once hemodialysis access is sorted out. He will need regular cardiology follow-up for cardiomyopathy, can be arranged with Dr. Purvis Sheffield at discharge.   Jonelle Sidle, M.D., F.A.C.C.

## 2014-12-21 NOTE — Progress Notes (Signed)
Subjective: Interval History: Patient offers no complaints. He denies any difficulty breathing. Patient wants to go home.  Objective: Vital signs in last 24 hours: Temp:  [97.7 F (36.5 C)-98.3 F (36.8 C)] 97.7 F (36.5 C) (03/07 0551) Pulse Rate:  [80-95] 80 (03/07 0551) Resp:  [20] 20 (03/07 0551) BP: (99-123)/(61-70) 111/70 mmHg (03/07 0551) SpO2:  [100 %] 100 % (03/07 0551) Weight:  [52.4 kg (115 lb 8.3 oz)] 52.4 kg (115 lb 8.3 oz) (03/07 0551) Weight change: -0.4 kg (-14.1 oz)  Intake/Output from previous day: 03/06 0701 - 03/07 0700 In: 483 [P.O.:480; I.V.:3] Out: 200 [Urine:200] Intake/Output this shift: Total I/O In: 120 [P.O.:120] Out: -   General appearance: alert, cooperative and no distress Resp: clear to auscultation bilaterally Cardio: regular rate and rhythm, S1, S2 normal, no murmur, click, rub or gallop GI: soft, non-tender; bowel sounds normal; no masses,  no organomegaly Extremities: extremities normal, atraumatic, no cyanosis or edema  Lab Results:  Recent Labs  12/20/14 0634  WBC 5.7  HGB 12.0*  HCT 36.6*  PLT 188   BMET:   Recent Labs  12/20/14 0634 12/21/14 0557  NA 137 139  K 3.7 4.0  CL 99 102  CO2 27 25  GLUCOSE 103* 102*  BUN 46* 60*  CREATININE 10.60* 11.77*  CALCIUM 8.0* 7.5*   No results for input(s): PTH in the last 72 hours. Iron Studies: No results for input(s): IRON, TIBC, TRANSFERRIN, FERRITIN in the last 72 hours.  Studies/Results: No results found.  I have reviewed the patient's current medications.  Assessment/Plan: Problem #1 history of fever: Patient is afebrile today. Patient blood culture is positive for MRSA. Source of infection most likely from his catheter. Patient remains afebrile with normal white blood cell count. Problem #2 end-stage renal disease: Status post hemodialysis on Friday. His potassium is good and no uremic signs and symptoms. Problem #3 anemia: His hemoglobin is above our target  range. Problem #4 history of HIV: Patient on antiviral medications. Problem #5 metabolic bone disease: His calcium in the range but his phosphorus is slightly high. Problem #6 fluid management: Patient does not have any sign of fluid overload. Plan: I called his surgeon DrTora Perches at [phone] this morning and I have discussed with her about his access. Presently since his fistula is more than 6 weeks she agreed to try to use it other than to put another tunneled catheter. Hence we'll use a small needle and attempt to do dialysis today. We'll check his phosphorus and basic metabolic panel is the morning.   LOS: 4 days   Paeton Studer S 12/21/2014,11:17 AM

## 2014-12-23 LAB — CATH TIP CULTURE: Culture: 80

## 2014-12-26 LAB — CULTURE, BLOOD (ROUTINE X 2)
CULTURE: NO GROWTH
Culture: NO GROWTH

## 2015-01-28 ENCOUNTER — Ambulatory Visit (INDEPENDENT_AMBULATORY_CARE_PROVIDER_SITE_OTHER): Payer: Medicaid Other | Admitting: Cardiovascular Disease

## 2015-01-28 ENCOUNTER — Encounter: Payer: Self-pay | Admitting: Cardiovascular Disease

## 2015-01-28 VITALS — BP 132/90 | HR 104 | Ht 65.0 in | Wt 139.0 lb

## 2015-01-28 DIAGNOSIS — B20 Human immunodeficiency virus [HIV] disease: Secondary | ICD-10-CM | POA: Diagnosis not present

## 2015-01-28 DIAGNOSIS — I429 Cardiomyopathy, unspecified: Secondary | ICD-10-CM

## 2015-01-28 DIAGNOSIS — N186 End stage renal disease: Secondary | ICD-10-CM | POA: Diagnosis not present

## 2015-01-28 DIAGNOSIS — R7881 Bacteremia: Secondary | ICD-10-CM

## 2015-01-28 DIAGNOSIS — Z9289 Personal history of other medical treatment: Secondary | ICD-10-CM

## 2015-01-28 DIAGNOSIS — B9562 Methicillin resistant Staphylococcus aureus infection as the cause of diseases classified elsewhere: Secondary | ICD-10-CM

## 2015-01-28 DIAGNOSIS — Z992 Dependence on renal dialysis: Secondary | ICD-10-CM

## 2015-01-28 DIAGNOSIS — Z87898 Personal history of other specified conditions: Secondary | ICD-10-CM

## 2015-01-28 MED ORDER — CARVEDILOL 6.25 MG PO TABS: 6.2500 mg | ORAL_TABLET | Freq: Two times a day (BID) | ORAL | Status: DC

## 2015-01-28 NOTE — Patient Instructions (Addendum)
Your physician wants you to follow-up in: 4 months Dr Reggy Eye will receive a reminder letter in the mail two months in advance. If you don't receive a letter, please call our office to schedule the follow-up appointment.     INCREASE Coreg to 6.25 mg twice a day    Thank you for choosing [facility] !

## 2015-01-28 NOTE — Progress Notes (Signed)
Patient ID: Dave Estrada, male   DOB: 01-28-1991, 24 y.o.   MRN: [id number]      SUBJECTIVE: The patient presents for post hospitalization follow-up. 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 Also has ESRD on hemodialysis and had another catheter placed on 09/15/14 and AV graft placed in 10/2014. I consulted on him in March when he was admitted with Staph Aureus bacteremia (MRSA). ID had evaluated pt and initially recommended a TEE be repeated to exclude valvular vegetations. However, since 6 weeks of IV antimicrobial therapy was planned and the catheter was removed, it was decided not to proceed with a TEE as it would not significantly change management.  He underwent a transthoracic echocardiogram on 12/19/14 which demonstrated severely reduced left ventricular systolic function with diffuse hypokinesis, EF 20%. There was grade 2 diastolic dysfunction with increased filling pressures, mild mitral regurgitation, mildly reduced right ventricular systolic function, and a small mobile mass at the tip of a catheter protruding into the right atrium.  He was started on low-dose carvedilol and lisinopril prior to discharge. Apparently, the left arm AV graft is not functioning and he had to have another dialysis catheter placed.  Denies fevers, chest pain, shortness of breath, and leg swelling.   Review of Systems: As per "subjective", otherwise negative.  Allergies  Allergen Reactions  . Other Anaphylaxis  . Aspirin Other (See Comments)    Reaction is unknown    Current Outpatient Prescriptions  Medication Sig Dispense Refill  . albuterol (PROVENTIL HFA;VENTOLIN HFA) 108 (90 BASE) MCG/ACT inhaler Inhale 1-2 puffs into the lungs every 6 (six) hours as  needed for wheezing or shortness of breath.    . carvedilol (COREG) 3.125 MG tablet Take 1 tablet (3.125 mg total) by mouth 2 (two) times daily with a meal. New medication for your heart. 60 tablet 2  . emtricitabine (EMTRIVA) 200 MG capsule Take 200 mg by mouth 2 (two) times a week. Taken after dialysis on Mondays and Fridays    . etravirine (INTELENCE) 100 MG tablet Take 200 mg by mouth every 12 (twelve) hours.    Marland Kitchen ibuprofen (ADVIL,MOTRIN) 200 MG tablet Take 200 mg by mouth every 6 (six) hours as needed for fever or moderate pain.    Marland Kitchen lisinopril (PRINIVIL,ZESTRIL) 2.5 MG tablet Take 0.5 tablets (1.25 mg total) by mouth daily. New medication for your heart. 30 tablet 2  . raltegravir (ISENTRESS) 400 MG tablet Take 400 mg by mouth 2 (two) times daily.    Marland Kitchen tenofovir (VIREAD) 300 MG tablet Take 300 mg by mouth every Monday. To be taken on Mondays after dialysis    . vancomycin (VANCOCIN) 1 GM/200ML SOLN Inject 200 mLs (1,000 mg total) into the vein every Monday, Wednesday, and Friday with hemodialysis. For 6 more weeks. IV vancomycin started 12/16/2014. Anticipated end date 01/27/2015. 4000 mL    No current facility-administered medications for this visit.    Past Medical History  Diagnosis Date  . HIV (human immunodeficiency virus infection)   . ESRD (end stage renal disease)   . Staphylococcus aureus bacteremia with sepsis 09/09/2014  . A-V fistula     Placed on 10/27/2014 at [facility]; for future HD use.   . Systolic dysfunction, left ventricle 12/20/2014  EF 20%     Past Surgical History  Procedure Laterality Date  . Port a cath revision    . Tee without cardioversion N/A 09/14/2014    Procedure: TRANSESOPHAGEAL ECHOCARDIOGRAM (TEE);  Surgeon: Laurey Morale, MD;  Location: [facility] ENDOSCOPY;  Service: Cardiovascular;  Laterality: N/A;  will be at [facility] by mon  . Insertion of dialysis catheter Left 09/15/2014    Procedure: INSERTION OF DIALYSIS CATHETER;  Surgeon: Nada Libman, MD;  Location: [facility]  OR;  Service: Vascular;  Laterality: Left;    History   Social History  . Marital Status: Single    Spouse Name: N/A  . Number of Children: N/A  . Years of Education: N/A   Occupational History  . Not on file.   Social History Main Topics  . Smoking status: Current Every Day Smoker -- 1.00 packs/day for 11 years    Types: Cigarettes    Start date: 01/28/2003  . Smokeless tobacco: Never Used  . Alcohol Use: No  . Drug Use: Yes    Special: Marijuana     Comment: has not used x1 week  . Sexual Activity: Not Currently   Other Topics Concern  . Not on file   Social History Narrative     Filed Vitals:   01/28/15 1141  BP: 132/90  Pulse: 104  Height: 5\' 5"  (1.651 m)  Weight: 139 lb (63.05 kg)    PHYSICAL EXAM General: NAD HEENT: Normal. Neck: No JVD, no thyromegaly. Lungs: Prolonged expiratory phase with faint wheezes, no rales. CV: Tachycardic, regular rhythm, normal S1/S2, no S3/S4, no murmur. No pretibial or periankle edema.   Abdomen: Soft, nontender, no distention.  Neurologic: Alert and oriented x 3.  Psych: Normal affect. Skin: Normal. Musculoskeletal: Normal range of motion, no gross deformities. Left AV graft with thrill appreciated in forearm, but not above antecubital fossa. Extremities: No clubbing or cyanosis.   ECG: Most recent ECG reviewed.      ASSESSMENT AND PLAN: 1. Cardiomyopathy, EF 20%: Probably nonischemic and related to HIV. On carvedilol and very low-dose lisinopril. Will increase Coreg to 6.25 mg bid as hemodynamics will likely tolerate. Will reassess LV function in several months. Volume managed with hemodialysis. Given propensity for infection, likely a poor candidate for ICD.  2. Chronic HIV disease: Followed by [facility], on four drug therapy.  3. ESRD, on hemodialysis: Followed by Dr. Kristian Covey. Has dialysis catheter due to AV graft dysfunction.  4. MRSA bacteremia: Remains on vancomycin. Concern for recurrent infection given new  dialysis catheter in place. He has been in touch with vascular in [place] to have AV graft reassessed.  Dispo: f/u 4 months.  Prentice Docker, M.D., F.A.C.C.

## 2015-03-01 ENCOUNTER — Emergency Department (HOSPITAL_COMMUNITY)
Admission: EM | Admit: 2015-03-01 | Discharge: 2015-03-01 | Discharge status: Against Medical Advice | Payer: Medicaid Other | Attending: Emergency Medicine | Admitting: Emergency Medicine

## 2015-03-01 ENCOUNTER — Emergency Department (HOSPITAL_COMMUNITY): Payer: Medicaid Other

## 2015-03-01 ENCOUNTER — Encounter (HOSPITAL_COMMUNITY): Payer: Self-pay | Admitting: Emergency Medicine

## 2015-03-01 DIAGNOSIS — R0602 Shortness of breath: Secondary | ICD-10-CM | POA: Insufficient documentation

## 2015-03-01 DIAGNOSIS — N186 End stage renal disease: Secondary | ICD-10-CM | POA: Insufficient documentation

## 2015-03-01 DIAGNOSIS — Z720 Tobacco use: Secondary | ICD-10-CM | POA: Insufficient documentation

## 2015-03-01 DIAGNOSIS — R109 Unspecified abdominal pain: Secondary | ICD-10-CM | POA: Insufficient documentation

## 2015-03-01 LAB — CBC WITH DIFFERENTIAL/PLATELET
Basophils Absolute: 0 K/uL (ref 0.0–0.1)
Basophils Relative: 1 % (ref 0–1)
Eosinophils Absolute: 0.3 K/uL (ref 0.0–0.7)
Eosinophils Relative: 3 % (ref 0–5)
HCT: 33.6 % — ABNORMAL LOW (ref 39.0–52.0)
Hemoglobin: 10.9 g/dL — ABNORMAL LOW (ref 13.0–17.0)
Lymphocytes Relative: 16 % (ref 12–46)
Lymphs Abs: 1.3 K/uL (ref 0.7–4.0)
MCH: 35.2 pg — ABNORMAL HIGH (ref 26.0–34.0)
MCHC: 32.4 g/dL (ref 30.0–36.0)
MCV: 108.4 fL — ABNORMAL HIGH (ref 78.0–100.0)
Monocytes Absolute: 0.6 K/uL (ref 0.1–1.0)
Monocytes Relative: 7 % (ref 3–12)
Neutro Abs: 5.9 K/uL (ref 1.7–7.7)
Neutrophils Relative %: 73 % (ref 43–77)
Platelets: 318 K/uL (ref 150–400)
RBC: 3.1 MIL/uL — ABNORMAL LOW (ref 4.22–5.81)
RDW: 16.4 % — ABNORMAL HIGH (ref 11.5–15.5)
WBC: 8.1 K/uL (ref 4.0–10.5)

## 2015-03-01 LAB — COMPREHENSIVE METABOLIC PANEL
ALK PHOS: 122 U/L (ref 38–126)
ALT: 75 U/L — ABNORMAL HIGH (ref 17–63)
AST: 88 U/L — ABNORMAL HIGH (ref 15–41)
Albumin: 3.9 g/dL (ref 3.5–5.0)
Anion gap: 11 (ref 5–15)
BILIRUBIN TOTAL: 0.9 mg/dL (ref 0.3–1.2)
BUN: 55 mg/dL — ABNORMAL HIGH (ref 6–20)
CO2: 20 mmol/L — ABNORMAL LOW (ref 22–32)
Calcium: 8 mg/dL — ABNORMAL LOW (ref 8.9–10.3)
Chloride: 107 mmol/L (ref 101–111)
Creatinine, Ser: 8.91 mg/dL — ABNORMAL HIGH (ref 0.61–1.24)
GFR calc Af Amer: 9 mL/min — ABNORMAL LOW (ref >60)
GFR calc non Af Amer: 7 mL/min — ABNORMAL LOW (ref >60)
Glucose, Bld: 94 mg/dL (ref 65–99)
POTASSIUM: 5.4 mmol/L — AB (ref 3.5–5.1)
Sodium: 138 mmol/L (ref 135–145)
TOTAL PROTEIN: 7.7 g/dL (ref 6.5–8.1)

## 2015-03-01 LAB — PROTIME-INR
INR: 1.21 (ref 0.00–1.49)
PROTHROMBIN TIME: 15.5 s — AB (ref 11.6–15.2)

## 2015-03-01 LAB — APTT: aPTT: 31 s (ref 24–37)

## 2015-03-01 LAB — AMYLASE: Amylase: 161 U/L — ABNORMAL HIGH (ref 28–100)

## 2015-03-01 LAB — LACTIC ACID, PLASMA: Lactic Acid, Venous: 1.8 mmol/L (ref 0.5–2.0)

## 2015-03-01 NOTE — ED Notes (Signed)
Pt states that his stomach has been hurting for the past week or so and he gets out of breath with exertion.

## 2015-03-01 NOTE — ED Notes (Signed)
Family upset about wait . Leaving with family.

## 2015-05-18 DIAGNOSIS — N186 End stage renal disease: Secondary | ICD-10-CM | POA: Diagnosis not present

## 2015-05-18 DIAGNOSIS — D509 Iron deficiency anemia, unspecified: Secondary | ICD-10-CM | POA: Diagnosis not present

## 2015-05-18 DIAGNOSIS — N2581 Secondary hyperparathyroidism of renal origin: Secondary | ICD-10-CM | POA: Diagnosis not present

## 2015-05-18 DIAGNOSIS — I10 Essential (primary) hypertension: Secondary | ICD-10-CM | POA: Diagnosis not present

## 2015-05-18 DIAGNOSIS — D631 Anemia in chronic kidney disease: Secondary | ICD-10-CM | POA: Diagnosis not present

## 2015-05-18 DIAGNOSIS — D696 Thrombocytopenia, unspecified: Secondary | ICD-10-CM | POA: Diagnosis not present

## 2015-05-22 DIAGNOSIS — D631 Anemia in chronic kidney disease: Secondary | ICD-10-CM | POA: Diagnosis not present

## 2015-05-22 DIAGNOSIS — N186 End stage renal disease: Secondary | ICD-10-CM | POA: Diagnosis not present

## 2015-05-22 DIAGNOSIS — N2581 Secondary hyperparathyroidism of renal origin: Secondary | ICD-10-CM | POA: Diagnosis not present

## 2015-05-22 DIAGNOSIS — D509 Iron deficiency anemia, unspecified: Secondary | ICD-10-CM | POA: Diagnosis not present

## 2015-05-22 DIAGNOSIS — D696 Thrombocytopenia, unspecified: Secondary | ICD-10-CM | POA: Diagnosis not present

## 2015-05-25 DIAGNOSIS — D509 Iron deficiency anemia, unspecified: Secondary | ICD-10-CM | POA: Diagnosis not present

## 2015-05-25 DIAGNOSIS — D631 Anemia in chronic kidney disease: Secondary | ICD-10-CM | POA: Diagnosis not present

## 2015-05-25 DIAGNOSIS — N2581 Secondary hyperparathyroidism of renal origin: Secondary | ICD-10-CM | POA: Diagnosis not present

## 2015-05-25 DIAGNOSIS — N186 End stage renal disease: Secondary | ICD-10-CM | POA: Diagnosis not present

## 2015-05-25 DIAGNOSIS — D696 Thrombocytopenia, unspecified: Secondary | ICD-10-CM | POA: Diagnosis not present

## 2015-05-29 DIAGNOSIS — D631 Anemia in chronic kidney disease: Secondary | ICD-10-CM | POA: Diagnosis not present

## 2015-05-29 DIAGNOSIS — D696 Thrombocytopenia, unspecified: Secondary | ICD-10-CM | POA: Diagnosis not present

## 2015-05-29 DIAGNOSIS — D509 Iron deficiency anemia, unspecified: Secondary | ICD-10-CM | POA: Diagnosis not present

## 2015-05-29 DIAGNOSIS — N186 End stage renal disease: Secondary | ICD-10-CM | POA: Diagnosis not present

## 2015-05-29 DIAGNOSIS — N2581 Secondary hyperparathyroidism of renal origin: Secondary | ICD-10-CM | POA: Diagnosis not present

## 2015-05-31 DIAGNOSIS — N186 End stage renal disease: Secondary | ICD-10-CM | POA: Diagnosis not present

## 2015-05-31 DIAGNOSIS — T82898A Other specified complication of vascular prosthetic devices, implants and grafts, initial encounter: Secondary | ICD-10-CM | POA: Diagnosis not present

## 2015-06-03 DIAGNOSIS — N186 End stage renal disease: Secondary | ICD-10-CM | POA: Diagnosis not present

## 2015-06-03 DIAGNOSIS — D631 Anemia in chronic kidney disease: Secondary | ICD-10-CM | POA: Diagnosis not present

## 2015-06-03 DIAGNOSIS — D509 Iron deficiency anemia, unspecified: Secondary | ICD-10-CM | POA: Diagnosis not present

## 2015-06-03 DIAGNOSIS — N2581 Secondary hyperparathyroidism of renal origin: Secondary | ICD-10-CM | POA: Diagnosis not present

## 2015-06-03 DIAGNOSIS — D696 Thrombocytopenia, unspecified: Secondary | ICD-10-CM | POA: Diagnosis not present

## 2015-06-05 DIAGNOSIS — N2581 Secondary hyperparathyroidism of renal origin: Secondary | ICD-10-CM | POA: Diagnosis not present

## 2015-06-05 DIAGNOSIS — D631 Anemia in chronic kidney disease: Secondary | ICD-10-CM | POA: Diagnosis not present

## 2015-06-05 DIAGNOSIS — D509 Iron deficiency anemia, unspecified: Secondary | ICD-10-CM | POA: Diagnosis not present

## 2015-06-05 DIAGNOSIS — N186 End stage renal disease: Secondary | ICD-10-CM | POA: Diagnosis not present

## 2015-06-05 DIAGNOSIS — D696 Thrombocytopenia, unspecified: Secondary | ICD-10-CM | POA: Diagnosis not present

## 2015-06-07 ENCOUNTER — Ambulatory Visit (INDEPENDENT_AMBULATORY_CARE_PROVIDER_SITE_OTHER): Payer: Medicare Other | Admitting: Cardiovascular Disease

## 2015-06-07 ENCOUNTER — Encounter: Payer: Self-pay | Admitting: Cardiovascular Disease

## 2015-06-07 VITALS — BP 110/80 | HR 104 | Ht 65.0 in | Wt 124.4 lb

## 2015-06-07 DIAGNOSIS — R000 Tachycardia, unspecified: Secondary | ICD-10-CM

## 2015-06-07 DIAGNOSIS — Z7289 Other problems related to lifestyle: Secondary | ICD-10-CM | POA: Diagnosis not present

## 2015-06-07 DIAGNOSIS — Z992 Dependence on renal dialysis: Secondary | ICD-10-CM | POA: Diagnosis not present

## 2015-06-07 DIAGNOSIS — I429 Cardiomyopathy, unspecified: Secondary | ICD-10-CM

## 2015-06-07 DIAGNOSIS — I428 Other cardiomyopathies: Secondary | ICD-10-CM

## 2015-06-07 DIAGNOSIS — N186 End stage renal disease: Secondary | ICD-10-CM | POA: Diagnosis not present

## 2015-06-07 DIAGNOSIS — Z113 Encounter for screening for infections with a predominantly sexual mode of transmission: Secondary | ICD-10-CM | POA: Diagnosis not present

## 2015-06-07 DIAGNOSIS — B20 Human immunodeficiency virus [HIV] disease: Secondary | ICD-10-CM | POA: Diagnosis not present

## 2015-06-07 MED ORDER — METOPROLOL SUCCINATE ER 25 MG PO TB24: 25.0000 mg | ORAL_TABLET | Freq: Two times a day (BID) | ORAL | Status: DC

## 2015-06-07 MED ORDER — METOPROLOL SUCCINATE ER 25 MG PO TB24: 25.0000 mg | ORAL_TABLET | Freq: Every day | ORAL | Status: DC

## 2015-06-07 NOTE — Patient Instructions (Signed)
Your physician wants you to follow-up in: 6 months with Dr Reggy Eye will receive a reminder letter in the mail two months in advance. If you don't receive a letter, please call our office to schedule the follow-up appointment.    STOP Coreg   START Toprol XL 25 mg twice a day    Your physician has requested that you have an echocardiogram in 1 month. Echocardiography is a painless test that uses sound waves to create images of your heart. It provides your doctor with information about the size and shape of your heart and how well your heart's chambers and valves are working. This procedure takes approximately one hour. There are no restrictions for this procedure.  Thank you for choosing [facility] !

## 2015-06-07 NOTE — Progress Notes (Signed)
Patient ID: Dave Estrada, male   DOB: 30-Apr-1991, 24 y.o.   MRN: [id number]      SUBJECTIVE: The patient presents for routine follow-up. 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 Also has ESRD on hemodialysis and had another catheter placed on 09/15/14 and AV graft placed in 10/2014.  I consulted on him in March 2016 when he was admitted with Staph Aureus bacteremia (MRSA). ID had evaluated pt and initially recommended a TEE be repeated to exclude valvular vegetations. However, since 6 weeks of IV antimicrobial therapy was planned and the catheter was removed, it was decided not to proceed with a TEE as it would not significantly change management.  He underwent a transthoracic echocardiogram on 12/19/14 which demonstrated severely reduced left ventricular systolic function with diffuse hypokinesis, EF 20%. There was grade 2 diastolic dysfunction with increased filling pressures, mild mitral regurgitation, mildly reduced right ventricular systolic function, and a small mobile mass at the tip of a catheter protruding into the right atrium.  He was started on low-dose carvedilol and lisinopril prior to discharge.  Denies fevers, chest pain, shortness of breath, and leg swelling.  Had catheter removed last week. AV graft working well.   Review of Systems: As per "subjective", otherwise negative.  Allergies  Allergen Reactions  . Other Anaphylaxis  . Aspirin Other (See Comments)    Reaction is unknown    Current Outpatient Prescriptions  Medication Sig Dispense Refill  . albuterol (PROVENTIL HFA;VENTOLIN HFA) 108 (90 BASE) MCG/ACT inhaler Inhale 1-2 puffs into the lungs every 6 (six) hours as needed for wheezing or shortness of breath.    .  carvedilol (COREG) 6.25 MG tablet Take 1 tablet (6.25 mg total) by mouth 2 (two) times daily with a meal. New medication for your heart. 60 tablet 6  . emtricitabine (EMTRIVA) 200 MG capsule Take 200 mg by mouth 2 (two) times a week. Taken after dialysis on Mondays and Fridays    . etravirine (INTELENCE) 100 MG tablet Take 200 mg by mouth every 12 (twelve) hours.    Marland Kitchen ibuprofen (ADVIL,MOTRIN) 200 MG tablet Take 200 mg by mouth every 6 (six) hours as needed for fever or moderate pain.    Marland Kitchen lisinopril (PRINIVIL,ZESTRIL) 2.5 MG tablet Take 0.5 tablets (1.25 mg total) by mouth daily. New medication for your heart. 30 tablet 2  . raltegravir (ISENTRESS) 400 MG tablet Take 400 mg by mouth 2 (two) times daily.    Marland Kitchen tenofovir (VIREAD) 300 MG tablet Take 300 mg by mouth every Monday. To be taken on Mondays after dialysis    . vancomycin (VANCOCIN) 1 GM/200ML SOLN Inject 200 mLs (1,000 mg total) into the vein every Monday, Wednesday, and Friday with hemodialysis. For 6 more weeks. IV vancomycin started 12/16/2014. Anticipated end date 01/27/2015. 4000 mL    No current facility-administered medications for this visit.    Past Medical History  Diagnosis Date  . HIV (human immunodeficiency virus infection)   . ESRD (end stage renal disease)   . Staphylococcus aureus bacteremia with sepsis 09/09/2014  . A-V fistula     Placed on 10/27/2014 at [facility]; for future HD use.   . Systolic dysfunction, left ventricle 12/20/2014    EF 20%  Past Surgical History  Procedure Laterality Date  . Port a cath revision    . Tee without cardioversion N/A 09/14/2014    Procedure: TRANSESOPHAGEAL ECHOCARDIOGRAM (TEE);  Surgeon: Laurey Morale, MD;  Location: [facility] ENDOSCOPY;  Service: Cardiovascular;  Laterality: N/A;  will be at [facility] by mon  . Insertion of dialysis catheter Left 09/15/2014    Procedure: INSERTION OF DIALYSIS CATHETER;  Surgeon: Nada Libman, MD;  Location: [facility] OR;  Service: Vascular;  Laterality: Left;     Social History   Social History  . Marital Status: Single    Spouse Name: N/A  . Number of Children: N/A  . Years of Education: N/A   Occupational History  . Not on file.   Social History Main Topics  . Smoking status: Current Every Day Smoker -- 1.00 packs/day for 11 years    Types: Cigarettes    Start date: 01/28/2003  . Smokeless tobacco: Never Used  . Alcohol Use: No  . Drug Use: Yes    Special: Marijuana  . Sexual Activity: Not Currently   Other Topics Concern  . Not on file   Social History Narrative     Filed Vitals:   06/07/15 1524  BP: 110/80  Pulse: 104  Height:  (1.651 m)  Weight: 124 lb 6.4 oz (56.427 kg)  SpO2: 99%    PHYSICAL EXAM General: NAD HEENT: Normal. Neck: No JVD, no thyromegaly. Lungs: Prolonged expiratory phase with faint wheezes, no rales. CV: Tachycardic, regular rhythm, normal S1/S2, no S3/S4, very soft high pitched 1/6 apical systolic murmur. No pretibial or periankle edema.  Abdomen: Soft, nontender, no distention.  Neurologic: Alert and oriented x 3.  Psych: Normal affect. Skin: Normal. Musculoskeletal: Normal range of motion, no gross deformities. Left AV graft with thrill appreciated in forearm, but not above antecubital fossa. Extremities: No clubbing or cyanosis.   ECG: Most recent ECG reviewed.      ASSESSMENT AND PLAN: 1. Cardiomyopathy, EF 20%: Probably nonischemic and related to HIV. On carvedilol and very low-dose lisinopril. HR elevated. Will switch Coreg to Toprol-XL 25 mg bid so as not to precipitate hypotension. Will reassess LV function with echo. Volume managed with hemodialysis. Given propensity for infection, likely a poor candidate for ICD.  2. Chronic HIV disease: Followed by [facility], on four drug therapy.  3. ESRD, on hemodialysis: Followed by Dr. Kristian Covey.    Dispo: f/u 6 months.   Prentice Docker, M.D., F.A.C.C.

## 2015-06-08 DIAGNOSIS — D696 Thrombocytopenia, unspecified: Secondary | ICD-10-CM | POA: Diagnosis not present

## 2015-06-08 DIAGNOSIS — N186 End stage renal disease: Secondary | ICD-10-CM | POA: Diagnosis not present

## 2015-06-08 DIAGNOSIS — D631 Anemia in chronic kidney disease: Secondary | ICD-10-CM | POA: Diagnosis not present

## 2015-06-08 DIAGNOSIS — N2581 Secondary hyperparathyroidism of renal origin: Secondary | ICD-10-CM | POA: Diagnosis not present

## 2015-06-08 DIAGNOSIS — D509 Iron deficiency anemia, unspecified: Secondary | ICD-10-CM | POA: Diagnosis not present

## 2015-06-12 DIAGNOSIS — N186 End stage renal disease: Secondary | ICD-10-CM | POA: Diagnosis not present

## 2015-06-12 DIAGNOSIS — D509 Iron deficiency anemia, unspecified: Secondary | ICD-10-CM | POA: Diagnosis not present

## 2015-06-12 DIAGNOSIS — D696 Thrombocytopenia, unspecified: Secondary | ICD-10-CM | POA: Diagnosis not present

## 2015-06-12 DIAGNOSIS — D631 Anemia in chronic kidney disease: Secondary | ICD-10-CM | POA: Diagnosis not present

## 2015-06-12 DIAGNOSIS — N2581 Secondary hyperparathyroidism of renal origin: Secondary | ICD-10-CM | POA: Diagnosis not present

## 2015-06-15 DIAGNOSIS — D631 Anemia in chronic kidney disease: Secondary | ICD-10-CM | POA: Diagnosis not present

## 2015-06-15 DIAGNOSIS — N2581 Secondary hyperparathyroidism of renal origin: Secondary | ICD-10-CM | POA: Diagnosis not present

## 2015-06-15 DIAGNOSIS — D509 Iron deficiency anemia, unspecified: Secondary | ICD-10-CM | POA: Diagnosis not present

## 2015-06-15 DIAGNOSIS — N186 End stage renal disease: Secondary | ICD-10-CM | POA: Diagnosis not present

## 2015-06-15 DIAGNOSIS — D696 Thrombocytopenia, unspecified: Secondary | ICD-10-CM | POA: Diagnosis not present

## 2015-06-16 DIAGNOSIS — N186 End stage renal disease: Secondary | ICD-10-CM | POA: Diagnosis not present

## 2015-06-16 DIAGNOSIS — Z992 Dependence on renal dialysis: Secondary | ICD-10-CM | POA: Diagnosis not present

## 2015-06-17 DIAGNOSIS — D696 Thrombocytopenia, unspecified: Secondary | ICD-10-CM | POA: Diagnosis not present

## 2015-06-17 DIAGNOSIS — Z23 Encounter for immunization: Secondary | ICD-10-CM | POA: Diagnosis not present

## 2015-06-17 DIAGNOSIS — D509 Iron deficiency anemia, unspecified: Secondary | ICD-10-CM | POA: Diagnosis not present

## 2015-06-17 DIAGNOSIS — N186 End stage renal disease: Secondary | ICD-10-CM | POA: Diagnosis not present

## 2015-06-17 DIAGNOSIS — I10 Essential (primary) hypertension: Secondary | ICD-10-CM | POA: Diagnosis not present

## 2015-06-17 DIAGNOSIS — N2581 Secondary hyperparathyroidism of renal origin: Secondary | ICD-10-CM | POA: Diagnosis not present

## 2015-06-17 DIAGNOSIS — D631 Anemia in chronic kidney disease: Secondary | ICD-10-CM | POA: Diagnosis not present

## 2015-06-28 DIAGNOSIS — Z79899 Other long term (current) drug therapy: Secondary | ICD-10-CM | POA: Diagnosis not present

## 2015-06-28 DIAGNOSIS — B20 Human immunodeficiency virus [HIV] disease: Secondary | ICD-10-CM | POA: Diagnosis not present

## 2015-07-09 ENCOUNTER — Ambulatory Visit (HOSPITAL_COMMUNITY): Payer: Medicare Other

## 2015-07-16 DIAGNOSIS — N186 End stage renal disease: Secondary | ICD-10-CM | POA: Diagnosis not present

## 2015-07-16 DIAGNOSIS — Z992 Dependence on renal dialysis: Secondary | ICD-10-CM | POA: Diagnosis not present

## 2015-07-19 ENCOUNTER — Ambulatory Visit (HOSPITAL_COMMUNITY)
Admission: RE | Admit: 2015-07-19 | Discharge: 2015-07-19 | Disposition: A | Discharge status: Home / Self Care | Payer: Medicare Other | Source: Ambulatory Visit | Attending: Cardiovascular Disease | Admitting: Cardiovascular Disease

## 2015-07-19 DIAGNOSIS — I10 Essential (primary) hypertension: Secondary | ICD-10-CM | POA: Diagnosis not present

## 2015-07-19 DIAGNOSIS — I429 Cardiomyopathy, unspecified: Secondary | ICD-10-CM | POA: Insufficient documentation

## 2015-07-19 DIAGNOSIS — Z720 Tobacco use: Secondary | ICD-10-CM | POA: Insufficient documentation

## 2015-07-19 DIAGNOSIS — I428 Other cardiomyopathies: Secondary | ICD-10-CM

## 2015-07-20 DIAGNOSIS — D509 Iron deficiency anemia, unspecified: Secondary | ICD-10-CM | POA: Diagnosis not present

## 2015-07-20 DIAGNOSIS — N2581 Secondary hyperparathyroidism of renal origin: Secondary | ICD-10-CM | POA: Diagnosis not present

## 2015-07-20 DIAGNOSIS — D631 Anemia in chronic kidney disease: Secondary | ICD-10-CM | POA: Diagnosis not present

## 2015-07-20 DIAGNOSIS — D696 Thrombocytopenia, unspecified: Secondary | ICD-10-CM | POA: Diagnosis not present

## 2015-07-20 DIAGNOSIS — I10 Essential (primary) hypertension: Secondary | ICD-10-CM | POA: Diagnosis not present

## 2015-07-20 DIAGNOSIS — N186 End stage renal disease: Secondary | ICD-10-CM | POA: Diagnosis not present

## 2015-07-24 DIAGNOSIS — I10 Essential (primary) hypertension: Secondary | ICD-10-CM | POA: Diagnosis not present

## 2015-07-24 DIAGNOSIS — N186 End stage renal disease: Secondary | ICD-10-CM | POA: Diagnosis not present

## 2015-07-24 DIAGNOSIS — N2581 Secondary hyperparathyroidism of renal origin: Secondary | ICD-10-CM | POA: Diagnosis not present

## 2015-07-24 DIAGNOSIS — D509 Iron deficiency anemia, unspecified: Secondary | ICD-10-CM | POA: Diagnosis not present

## 2015-07-24 DIAGNOSIS — D631 Anemia in chronic kidney disease: Secondary | ICD-10-CM | POA: Diagnosis not present

## 2015-07-27 DIAGNOSIS — N2581 Secondary hyperparathyroidism of renal origin: Secondary | ICD-10-CM | POA: Diagnosis not present

## 2015-07-27 DIAGNOSIS — D509 Iron deficiency anemia, unspecified: Secondary | ICD-10-CM | POA: Diagnosis not present

## 2015-07-27 DIAGNOSIS — I10 Essential (primary) hypertension: Secondary | ICD-10-CM | POA: Diagnosis not present

## 2015-07-27 DIAGNOSIS — N186 End stage renal disease: Secondary | ICD-10-CM | POA: Diagnosis not present

## 2015-07-27 DIAGNOSIS — D631 Anemia in chronic kidney disease: Secondary | ICD-10-CM | POA: Diagnosis not present

## 2015-07-31 DIAGNOSIS — I10 Essential (primary) hypertension: Secondary | ICD-10-CM | POA: Diagnosis not present

## 2015-07-31 DIAGNOSIS — D509 Iron deficiency anemia, unspecified: Secondary | ICD-10-CM | POA: Diagnosis not present

## 2015-07-31 DIAGNOSIS — D631 Anemia in chronic kidney disease: Secondary | ICD-10-CM | POA: Diagnosis not present

## 2015-07-31 DIAGNOSIS — N2581 Secondary hyperparathyroidism of renal origin: Secondary | ICD-10-CM | POA: Diagnosis not present

## 2015-07-31 DIAGNOSIS — N186 End stage renal disease: Secondary | ICD-10-CM | POA: Diagnosis not present

## 2015-08-03 DIAGNOSIS — N2581 Secondary hyperparathyroidism of renal origin: Secondary | ICD-10-CM | POA: Diagnosis not present

## 2015-08-03 DIAGNOSIS — I10 Essential (primary) hypertension: Secondary | ICD-10-CM | POA: Diagnosis not present

## 2015-08-03 DIAGNOSIS — D631 Anemia in chronic kidney disease: Secondary | ICD-10-CM | POA: Diagnosis not present

## 2015-08-03 DIAGNOSIS — D509 Iron deficiency anemia, unspecified: Secondary | ICD-10-CM | POA: Diagnosis not present

## 2015-08-03 DIAGNOSIS — N186 End stage renal disease: Secondary | ICD-10-CM | POA: Diagnosis not present

## 2015-08-05 DIAGNOSIS — N186 End stage renal disease: Secondary | ICD-10-CM | POA: Diagnosis not present

## 2015-08-05 DIAGNOSIS — N2581 Secondary hyperparathyroidism of renal origin: Secondary | ICD-10-CM | POA: Diagnosis not present

## 2015-08-05 DIAGNOSIS — I10 Essential (primary) hypertension: Secondary | ICD-10-CM | POA: Diagnosis not present

## 2015-08-05 DIAGNOSIS — D631 Anemia in chronic kidney disease: Secondary | ICD-10-CM | POA: Diagnosis not present

## 2015-08-05 DIAGNOSIS — D509 Iron deficiency anemia, unspecified: Secondary | ICD-10-CM | POA: Diagnosis not present

## 2015-08-07 DIAGNOSIS — D631 Anemia in chronic kidney disease: Secondary | ICD-10-CM | POA: Diagnosis not present

## 2015-08-07 DIAGNOSIS — D509 Iron deficiency anemia, unspecified: Secondary | ICD-10-CM | POA: Diagnosis not present

## 2015-08-07 DIAGNOSIS — N2581 Secondary hyperparathyroidism of renal origin: Secondary | ICD-10-CM | POA: Diagnosis not present

## 2015-08-07 DIAGNOSIS — I10 Essential (primary) hypertension: Secondary | ICD-10-CM | POA: Diagnosis not present

## 2015-08-07 DIAGNOSIS — N186 End stage renal disease: Secondary | ICD-10-CM | POA: Diagnosis not present

## 2015-08-09 DIAGNOSIS — N186 End stage renal disease: Secondary | ICD-10-CM | POA: Diagnosis not present

## 2015-08-09 DIAGNOSIS — Z719 Counseling, unspecified: Secondary | ICD-10-CM | POA: Diagnosis not present

## 2015-08-09 DIAGNOSIS — Z992 Dependence on renal dialysis: Secondary | ICD-10-CM | POA: Diagnosis not present

## 2015-08-09 DIAGNOSIS — B20 Human immunodeficiency virus [HIV] disease: Secondary | ICD-10-CM | POA: Diagnosis not present

## 2015-08-09 DIAGNOSIS — Z79899 Other long term (current) drug therapy: Secondary | ICD-10-CM | POA: Diagnosis not present

## 2015-08-10 DIAGNOSIS — N186 End stage renal disease: Secondary | ICD-10-CM | POA: Diagnosis not present

## 2015-08-10 DIAGNOSIS — I10 Essential (primary) hypertension: Secondary | ICD-10-CM | POA: Diagnosis not present

## 2015-08-10 DIAGNOSIS — N2581 Secondary hyperparathyroidism of renal origin: Secondary | ICD-10-CM | POA: Diagnosis not present

## 2015-08-10 DIAGNOSIS — D631 Anemia in chronic kidney disease: Secondary | ICD-10-CM | POA: Diagnosis not present

## 2015-08-10 DIAGNOSIS — D509 Iron deficiency anemia, unspecified: Secondary | ICD-10-CM | POA: Diagnosis not present

## 2015-08-14 ENCOUNTER — Emergency Department (HOSPITAL_COMMUNITY)
Admission: EM | Admit: 2015-08-14 | Discharge: 2015-08-14 | Disposition: A | Discharge status: Home / Self Care | Payer: Medicare Other | Attending: Emergency Medicine | Admitting: Emergency Medicine

## 2015-08-14 ENCOUNTER — Encounter (HOSPITAL_COMMUNITY): Payer: Self-pay

## 2015-08-14 DIAGNOSIS — Z8679 Personal history of other diseases of the circulatory system: Secondary | ICD-10-CM | POA: Insufficient documentation

## 2015-08-14 DIAGNOSIS — Z8619 Personal history of other infectious and parasitic diseases: Secondary | ICD-10-CM | POA: Diagnosis not present

## 2015-08-14 DIAGNOSIS — Z79899 Other long term (current) drug therapy: Secondary | ICD-10-CM | POA: Diagnosis not present

## 2015-08-14 DIAGNOSIS — R0989 Other specified symptoms and signs involving the circulatory and respiratory systems: Secondary | ICD-10-CM | POA: Insufficient documentation

## 2015-08-14 DIAGNOSIS — B20 Human immunodeficiency virus [HIV] disease: Secondary | ICD-10-CM | POA: Insufficient documentation

## 2015-08-14 DIAGNOSIS — L0231 Cutaneous abscess of buttock: Secondary | ICD-10-CM | POA: Diagnosis not present

## 2015-08-14 DIAGNOSIS — Z992 Dependence on renal dialysis: Secondary | ICD-10-CM | POA: Insufficient documentation

## 2015-08-14 DIAGNOSIS — D631 Anemia in chronic kidney disease: Secondary | ICD-10-CM | POA: Diagnosis not present

## 2015-08-14 DIAGNOSIS — N186 End stage renal disease: Secondary | ICD-10-CM | POA: Diagnosis not present

## 2015-08-14 DIAGNOSIS — N2581 Secondary hyperparathyroidism of renal origin: Secondary | ICD-10-CM | POA: Diagnosis not present

## 2015-08-14 DIAGNOSIS — Z720 Tobacco use: Secondary | ICD-10-CM | POA: Insufficient documentation

## 2015-08-14 DIAGNOSIS — I10 Essential (primary) hypertension: Secondary | ICD-10-CM | POA: Diagnosis not present

## 2015-08-14 DIAGNOSIS — D509 Iron deficiency anemia, unspecified: Secondary | ICD-10-CM | POA: Diagnosis not present

## 2015-08-14 MED ORDER — HYDROCODONE-ACETAMINOPHEN 7.5-325 MG PO TABS: 1.0000 | ORAL_TABLET | ORAL | Status: DC | PRN

## 2015-08-14 MED ORDER — LIDOCAINE-EPINEPHRINE (PF) 1 %-1:200000 IJ SOLN
INTRAMUSCULAR | Status: AC
  Filled 2015-08-14: qty 10

## 2015-08-14 MED ORDER — DOXYCYCLINE HYCLATE 100 MG PO TABS
100.0000 mg | ORAL_TABLET | Freq: Once | ORAL | Status: AC
  Administered 2015-08-14: 100 mg via ORAL
  Filled 2015-08-14: qty 1

## 2015-08-14 MED ORDER — HYDROCODONE-ACETAMINOPHEN 5-325 MG PO TABS
2.0000 | ORAL_TABLET | Freq: Once | ORAL | Status: AC
  Administered 2015-08-14: 2 via ORAL
  Filled 2015-08-14: qty 2

## 2015-08-14 MED ORDER — DOXYCYCLINE HYCLATE 100 MG PO CAPS: 100.0000 mg | ORAL_CAPSULE | Freq: Two times a day (BID) | ORAL | Status: DC

## 2015-08-14 NOTE — Discharge Instructions (Signed)
Please soak in a tub of warm Epsom salt water daily for 15-20 minutes until abscess resolves. Please apply a bandage or dressing to the incision and drainage area. Use doxycycline 2 times daily with food. Use Norco for pain if needed. This medication may cause drowsiness, please use with caution. Incision and Drainage Incision and drainage is a procedure in which a sac-like structure (cystic structure) is opened and drained. The area to be drained usually contains material such as pus, fluid, or blood.  LET YOUR CAREGIVER KNOW ABOUT:   Allergies to medicine.  Medicines taken, including vitamins, herbs, eyedrops, over-the-counter medicines, and creams.  Use of steroids (by mouth or creams).  Previous problems with anesthetics or numbing medicines.  History of bleeding problems or blood clots.  Previous surgery.  Other health problems, including diabetes and kidney problems.  Possibility of pregnancy, if this applies. RISKS AND COMPLICATIONS  Pain.  Bleeding.  Scarring.  Infection. BEFORE THE PROCEDURE  You may need to have an ultrasound or other imaging tests to see how large or deep your cystic structure is. Blood tests may also be used to determine if you have an infection or how severe the infection is. You may need to have a tetanus shot. PROCEDURE  The affected area is cleaned with a cleaning fluid. The cyst area will then be numbed with a medicine (local anesthetic). A small incision will be made in the cystic structure. A syringe or catheter may be used to drain the contents of the cystic structure, or the contents may be squeezed out. The area will then be flushed with a cleansing solution. After cleansing the area, it is often gently packed with a gauze or another wound dressing. Once it is packed, it will be covered with gauze and tape or some other type of wound dressing. AFTER THE PROCEDURE   Often, you will be allowed to go home right after the procedure.  You may be  given antibiotic medicine to prevent or heal an infection.  If the area was packed with gauze or some other wound dressing, you will likely need to come back in 1 to 2 days to get it removed.  The area should heal in about 14 days.   This information is not intended to replace advice given to you by your health care provider. Make sure you discuss any questions you have with your health care provider.   Document Released: 03/28/2001 Document Revised: 04/02/2012 Document Reviewed: 11/27/2011 Elsevier Interactive Patient Education [employer]

## 2015-08-14 NOTE — ED Notes (Signed)
I have two boils, one on each buttock per pt.  I was trying to see if they would come to a head and I could pop them but they never did.

## 2015-08-14 NOTE — ED Provider Notes (Signed)
CSN: [id number]     Arrival date & time 08/14/15  1947 History   First MD Initiated Contact with Patient 08/14/15 2054     Chief Complaint  Patient presents with  . Abscess     (Consider location/radiation/quality/duration/timing/severity/associated sxs/prior Treatment) HPI Comments: Patient is a 24 year old male who presents to the emergency department with complaint of oils on right and left buttocks.  The patient states that he gets oils from time to time. He states he is usually able to "pop them" and drained them himself. He states that he is unable to get to the ones on his buttocks, and now they are uncomfortable and makes him uncomfortable even attempting to sit. He has not had fever or chills with this, and there is no drainage reported. It is of note that the patient is HIV positive, and on medications for the same.  The history is provided by the patient.    Past Medical History  Diagnosis Date  . HIV (human immunodeficiency virus infection) (HCC)   . ESRD (end stage renal disease) (HCC)   . Staphylococcus aureus bacteremia with sepsis (HCC) 09/09/2014  . A-V fistula (HCC)     Placed on 10/27/2014 at [facility]; for future HD use.   . Systolic dysfunction, left ventricle 12/20/2014    EF 20%    Past Surgical History  Procedure Laterality Date  . Port a cath revision    . Tee without cardioversion N/A 09/14/2014    Procedure: TRANSESOPHAGEAL ECHOCARDIOGRAM (TEE);  Surgeon: Laurey Morale, MD;  Location: [facility] ENDOSCOPY;  Service: Cardiovascular;  Laterality: N/A;  will be at [facility] by mon  . Insertion of dialysis catheter Left 09/15/2014    Procedure: INSERTION OF DIALYSIS CATHETER;  Surgeon: Nada Libman, MD;  Location: [facility] OR;  Service: Vascular;  Laterality: Left;   Family History  Problem Relation Age of Onset  . Seizures Father    Social History  Substance Use Topics  . Smoking status: Current Every Day Smoker -- 1.00 packs/day for 11 years    Types: Cigarettes    Start  date: 01/28/2003  . Smokeless tobacco: Never Used  . Alcohol Use: No    Review of Systems  Skin:       Abscess  All other systems reviewed and are negative.     Allergies  Other and Aspirin  Home Medications   Prior to Admission medications   Medication Sig Start Date End Date Taking? Authorizing Provider  albuterol (PROVENTIL HFA;VENTOLIN HFA) 108 (90 BASE) MCG/ACT inhaler Inhale 1-2 puffs into the lungs every 6 (six) hours as needed for wheezing or shortness of breath.    Historical Provider, MD  emtricitabine (EMTRIVA) 200 MG capsule Take 200 mg by mouth 2 (two) times a week. Taken after dialysis on Mondays and Fridays    Historical Provider, MD  etravirine (INTELENCE) 100 MG tablet Take 200 mg by mouth every 12 (twelve) hours.    Historical Provider, MD  ibuprofen (ADVIL,MOTRIN) 200 MG tablet Take 200 mg by mouth every 6 (six) hours as needed for fever or moderate pain.    Historical Provider, MD  lisinopril (PRINIVIL,ZESTRIL) 2.5 MG tablet Take 0.5 tablets (1.25 mg total) by mouth daily. New medication for your heart. 12/21/14   Elliot Cousin, MD  metoprolol succinate (TOPROL XL) 25 MG 24 hr tablet Take 1 tablet (25 mg total) by mouth 2 (two) times daily. 06/07/15   Laqueta Linden, MD  raltegravir (ISENTRESS) 400 MG tablet Take 400 mg  by mouth 2 (two) times daily.    Historical Provider, MD  tenofovir (VIREAD) 300 MG tablet Take 300 mg by mouth every Monday. To be taken on Mondays after dialysis    Historical Provider, MD  vancomycin (VANCOCIN) 1 GM/200ML SOLN Inject 200 mLs (1,000 mg total) into the vein every Monday, Wednesday, and Friday with hemodialysis. For 6 more weeks. IV vancomycin started 12/16/2014. Anticipated end date 01/27/2015. 12/21/14   Elliot Cousin, MD   BP 117/84 mmHg  Pulse 117  Temp(Src) 97.5 F (36.4 C) (Oral)  Resp 20  Ht  (1.651 m)  Wt 135 lb (61.236 kg)  BMI 22.47 kg/m2  SpO2 100% Physical Exam  Constitutional: He is oriented to person, place,  and time. He appears well-developed and well-nourished.  Non-toxic appearance.  HENT:  Head: Normocephalic.  Right Ear: Tympanic membrane and external ear normal.  Left Ear: Tympanic membrane and external ear normal.  Eyes: EOM and lids are normal. Pupils are equal, round, and reactive to light.  Neck: Normal range of motion. Neck supple. Carotid bruit is not present.  Cardiovascular: Normal rate, regular rhythm, normal heart sounds, intact distal pulses and normal pulses.   Pulmonary/Chest: No respiratory distress. He has rhonchi.  Abdominal: Soft. Bowel sounds are normal. There is no tenderness. There is no guarding.  Genitourinary:     There is an abscess of the right and left buttocks cheek. The anus is noninvolved. The abscess on the left has induration. There is no red streaks on the right or the left.  Musculoskeletal: Normal range of motion.  Lymphadenopathy:       Head (right side): No submandibular adenopathy present.       Head (left side): No submandibular adenopathy present.    He has no cervical adenopathy.  Neurological: He is alert and oriented to person, place, and time. He has normal strength. No cranial nerve deficit or sensory deficit.  Skin: Skin is warm and dry.  Psychiatric: He has a normal mood and affect. His speech is normal.  Nursing note and vitals reviewed.   ED Course  .Marland KitchenIncision and Drainage Date/Time: 08/14/2015 9:58 PM Performed by: Ivery Quale Authorized by: Ivery Quale Consent: Verbal consent obtained. Risks and benefits: risks, benefits and alternatives were discussed Consent given by: patient Patient understanding: patient states understanding of the procedure being performed Patient identity confirmed: arm band Time out: Immediately prior to procedure a "time out" was called to verify the correct patient, procedure, equipment, support staff and site/side marked as required. Type: abscess Body area: anogenital (right and left  buttocks) Anesthesia: local infiltration Local anesthetic: lidocaine 1% with epinephrine Patient sedated: no Scalpel size: 11 Incision type: single straight Complexity: simple Drainage: purulent Drainage amount: copious Wound treatment: wound left open Patient tolerance: Patient tolerated the procedure well with no immediate complications Comments: Culture sent to the lab   (including critical care time) Labs Review Labs Reviewed - No data to display  Imaging Review No results found. I have personally reviewed and evaluated these images and lab results as part of my medical decision-making.   EKG Interpretation None      MDM  Patient presented to the emergency department with an abscess on the right and left buttocks. The abscess on the right buttocks cheek was not a candidate for incision and drainage. Incision and drainage was carried out on the left. Culture was sent to the lab. Patient will be placed on doxycycline. Patient is also given a prescription for Norco  7.5 mg for assistance with discomfort. He will use salt water soaks until the abscess area resolves. He will see his primary physicians if signs of advancing infection.    Final diagnoses:  None    *I have reviewed nursing notes, vital signs, and all appropriate lab and imaging results for this patient.[address] PA-C 08/14/15 2202  Mancel Bale, MD 08/15/15 [phone]

## 2015-08-16 DIAGNOSIS — Z992 Dependence on renal dialysis: Secondary | ICD-10-CM | POA: Diagnosis not present

## 2015-08-16 DIAGNOSIS — N186 End stage renal disease: Secondary | ICD-10-CM | POA: Diagnosis not present

## 2015-08-17 DIAGNOSIS — D631 Anemia in chronic kidney disease: Secondary | ICD-10-CM | POA: Diagnosis not present

## 2015-08-17 DIAGNOSIS — D696 Thrombocytopenia, unspecified: Secondary | ICD-10-CM | POA: Diagnosis not present

## 2015-08-17 DIAGNOSIS — N2581 Secondary hyperparathyroidism of renal origin: Secondary | ICD-10-CM | POA: Diagnosis not present

## 2015-08-17 DIAGNOSIS — D509 Iron deficiency anemia, unspecified: Secondary | ICD-10-CM | POA: Diagnosis not present

## 2015-08-17 DIAGNOSIS — I10 Essential (primary) hypertension: Secondary | ICD-10-CM | POA: Diagnosis not present

## 2015-08-17 DIAGNOSIS — N186 End stage renal disease: Secondary | ICD-10-CM | POA: Diagnosis not present

## 2015-08-18 ENCOUNTER — Telehealth (HOSPITAL_BASED_OUTPATIENT_CLINIC_OR_DEPARTMENT_OTHER): Payer: Self-pay | Admitting: Emergency Medicine

## 2015-08-18 LAB — CULTURE, ROUTINE-ABSCESS: Gram Stain: NONE SEEN

## 2015-08-19 ENCOUNTER — Telehealth (HOSPITAL_BASED_OUTPATIENT_CLINIC_OR_DEPARTMENT_OTHER): Payer: Self-pay | Admitting: Emergency Medicine

## 2015-08-19 DIAGNOSIS — N2581 Secondary hyperparathyroidism of renal origin: Secondary | ICD-10-CM | POA: Diagnosis not present

## 2015-08-19 DIAGNOSIS — D631 Anemia in chronic kidney disease: Secondary | ICD-10-CM | POA: Diagnosis not present

## 2015-08-19 DIAGNOSIS — D696 Thrombocytopenia, unspecified: Secondary | ICD-10-CM | POA: Diagnosis not present

## 2015-08-19 DIAGNOSIS — N186 End stage renal disease: Secondary | ICD-10-CM | POA: Diagnosis not present

## 2015-08-19 DIAGNOSIS — D509 Iron deficiency anemia, unspecified: Secondary | ICD-10-CM | POA: Diagnosis not present

## 2015-08-21 ENCOUNTER — Telehealth (HOSPITAL_COMMUNITY): Payer: Self-pay

## 2015-08-21 DIAGNOSIS — D696 Thrombocytopenia, unspecified: Secondary | ICD-10-CM | POA: Diagnosis not present

## 2015-08-21 DIAGNOSIS — N2581 Secondary hyperparathyroidism of renal origin: Secondary | ICD-10-CM | POA: Diagnosis not present

## 2015-08-21 DIAGNOSIS — D509 Iron deficiency anemia, unspecified: Secondary | ICD-10-CM | POA: Diagnosis not present

## 2015-08-21 DIAGNOSIS — D631 Anemia in chronic kidney disease: Secondary | ICD-10-CM | POA: Diagnosis not present

## 2015-08-21 DIAGNOSIS — N186 End stage renal disease: Secondary | ICD-10-CM | POA: Diagnosis not present

## 2015-08-22 ENCOUNTER — Telehealth (HOSPITAL_COMMUNITY): Payer: Self-pay

## 2015-08-22 NOTE — Telephone Encounter (Signed)
Unable to reach by telephone. Letter sent to address on record.  

## 2015-08-26 DIAGNOSIS — D696 Thrombocytopenia, unspecified: Secondary | ICD-10-CM | POA: Diagnosis not present

## 2015-08-26 DIAGNOSIS — D631 Anemia in chronic kidney disease: Secondary | ICD-10-CM | POA: Diagnosis not present

## 2015-08-26 DIAGNOSIS — N186 End stage renal disease: Secondary | ICD-10-CM | POA: Diagnosis not present

## 2015-08-26 DIAGNOSIS — D509 Iron deficiency anemia, unspecified: Secondary | ICD-10-CM | POA: Diagnosis not present

## 2015-08-26 DIAGNOSIS — N2581 Secondary hyperparathyroidism of renal origin: Secondary | ICD-10-CM | POA: Diagnosis not present

## 2015-08-28 DIAGNOSIS — D509 Iron deficiency anemia, unspecified: Secondary | ICD-10-CM | POA: Diagnosis not present

## 2015-08-28 DIAGNOSIS — N186 End stage renal disease: Secondary | ICD-10-CM | POA: Diagnosis not present

## 2015-08-28 DIAGNOSIS — D631 Anemia in chronic kidney disease: Secondary | ICD-10-CM | POA: Diagnosis not present

## 2015-08-28 DIAGNOSIS — D696 Thrombocytopenia, unspecified: Secondary | ICD-10-CM | POA: Diagnosis not present

## 2015-08-28 DIAGNOSIS — N2581 Secondary hyperparathyroidism of renal origin: Secondary | ICD-10-CM | POA: Diagnosis not present

## 2015-08-31 DIAGNOSIS — N186 End stage renal disease: Secondary | ICD-10-CM | POA: Diagnosis not present

## 2015-08-31 DIAGNOSIS — D696 Thrombocytopenia, unspecified: Secondary | ICD-10-CM | POA: Diagnosis not present

## 2015-08-31 DIAGNOSIS — N2581 Secondary hyperparathyroidism of renal origin: Secondary | ICD-10-CM | POA: Diagnosis not present

## 2015-08-31 DIAGNOSIS — D509 Iron deficiency anemia, unspecified: Secondary | ICD-10-CM | POA: Diagnosis not present

## 2015-08-31 DIAGNOSIS — D631 Anemia in chronic kidney disease: Secondary | ICD-10-CM | POA: Diagnosis not present

## 2015-09-02 DIAGNOSIS — N186 End stage renal disease: Secondary | ICD-10-CM | POA: Diagnosis not present

## 2015-09-02 DIAGNOSIS — D631 Anemia in chronic kidney disease: Secondary | ICD-10-CM | POA: Diagnosis not present

## 2015-09-02 DIAGNOSIS — N2581 Secondary hyperparathyroidism of renal origin: Secondary | ICD-10-CM | POA: Diagnosis not present

## 2015-09-02 DIAGNOSIS — D509 Iron deficiency anemia, unspecified: Secondary | ICD-10-CM | POA: Diagnosis not present

## 2015-09-02 DIAGNOSIS — D696 Thrombocytopenia, unspecified: Secondary | ICD-10-CM | POA: Diagnosis not present

## 2015-09-06 DIAGNOSIS — N2581 Secondary hyperparathyroidism of renal origin: Secondary | ICD-10-CM | POA: Diagnosis not present

## 2015-09-06 DIAGNOSIS — N186 End stage renal disease: Secondary | ICD-10-CM | POA: Diagnosis not present

## 2015-09-06 DIAGNOSIS — D509 Iron deficiency anemia, unspecified: Secondary | ICD-10-CM | POA: Diagnosis not present

## 2015-09-06 DIAGNOSIS — D631 Anemia in chronic kidney disease: Secondary | ICD-10-CM | POA: Diagnosis not present

## 2015-09-06 DIAGNOSIS — D696 Thrombocytopenia, unspecified: Secondary | ICD-10-CM | POA: Diagnosis not present

## 2015-09-08 ENCOUNTER — Telehealth (HOSPITAL_COMMUNITY): Payer: Self-pay

## 2015-09-08 DIAGNOSIS — N186 End stage renal disease: Secondary | ICD-10-CM | POA: Diagnosis not present

## 2015-09-08 DIAGNOSIS — D509 Iron deficiency anemia, unspecified: Secondary | ICD-10-CM | POA: Diagnosis not present

## 2015-09-08 DIAGNOSIS — D631 Anemia in chronic kidney disease: Secondary | ICD-10-CM | POA: Diagnosis not present

## 2015-09-08 DIAGNOSIS — D696 Thrombocytopenia, unspecified: Secondary | ICD-10-CM | POA: Diagnosis not present

## 2015-09-08 DIAGNOSIS — N2581 Secondary hyperparathyroidism of renal origin: Secondary | ICD-10-CM | POA: Diagnosis not present

## 2015-09-08 NOTE — Telephone Encounter (Signed)
Unable to reach by phone or mail.  Chart closed.   

## 2015-09-10 DIAGNOSIS — E43 Unspecified severe protein-calorie malnutrition: Secondary | ICD-10-CM | POA: Diagnosis present

## 2015-09-10 DIAGNOSIS — J9600 Acute respiratory failure, unspecified whether with hypoxia or hypercapnia: Secondary | ICD-10-CM | POA: Diagnosis present

## 2015-09-10 DIAGNOSIS — E875 Hyperkalemia: Secondary | ICD-10-CM | POA: Diagnosis present

## 2015-09-10 DIAGNOSIS — J189 Pneumonia, unspecified organism: Principal | ICD-10-CM | POA: Diagnosis present

## 2015-09-10 DIAGNOSIS — R05 Cough: Secondary | ICD-10-CM | POA: Diagnosis not present

## 2015-09-10 DIAGNOSIS — I132 Hypertensive heart and chronic kidney disease with heart failure and with stage 5 chronic kidney disease, or end stage renal disease: Secondary | ICD-10-CM | POA: Diagnosis present

## 2015-09-10 DIAGNOSIS — J45909 Unspecified asthma, uncomplicated: Secondary | ICD-10-CM | POA: Diagnosis present

## 2015-09-10 DIAGNOSIS — R0602 Shortness of breath: Secondary | ICD-10-CM | POA: Diagnosis not present

## 2015-09-10 DIAGNOSIS — Z886 Allergy status to analgesic agent status: Secondary | ICD-10-CM

## 2015-09-10 DIAGNOSIS — I5023 Acute on chronic systolic (congestive) heart failure: Secondary | ICD-10-CM | POA: Diagnosis present

## 2015-09-10 DIAGNOSIS — R042 Hemoptysis: Secondary | ICD-10-CM | POA: Diagnosis not present

## 2015-09-10 DIAGNOSIS — J9801 Acute bronchospasm: Secondary | ICD-10-CM | POA: Diagnosis not present

## 2015-09-10 DIAGNOSIS — B20 Human immunodeficiency virus [HIV] disease: Secondary | ICD-10-CM | POA: Diagnosis present

## 2015-09-10 DIAGNOSIS — Z992 Dependence on renal dialysis: Secondary | ICD-10-CM

## 2015-09-10 DIAGNOSIS — N189 Chronic kidney disease, unspecified: Secondary | ICD-10-CM | POA: Diagnosis not present

## 2015-09-10 DIAGNOSIS — F17210 Nicotine dependence, cigarettes, uncomplicated: Secondary | ICD-10-CM | POA: Diagnosis present

## 2015-09-10 DIAGNOSIS — Z8701 Personal history of pneumonia (recurrent): Secondary | ICD-10-CM

## 2015-09-10 DIAGNOSIS — D649 Anemia, unspecified: Secondary | ICD-10-CM | POA: Diagnosis present

## 2015-09-10 DIAGNOSIS — N186 End stage renal disease: Secondary | ICD-10-CM | POA: Diagnosis present

## 2015-09-10 DIAGNOSIS — Z6823 Body mass index (BMI) 23.0-23.9, adult: Secondary | ICD-10-CM

## 2015-09-11 ENCOUNTER — Emergency Department (HOSPITAL_COMMUNITY): Payer: Medicare Other

## 2015-09-11 ENCOUNTER — Inpatient Hospital Stay (HOSPITAL_COMMUNITY): Payer: Medicare Other

## 2015-09-11 ENCOUNTER — Other Ambulatory Visit (HOSPITAL_COMMUNITY): Payer: Medicare Other

## 2015-09-11 ENCOUNTER — Encounter (HOSPITAL_COMMUNITY): Payer: Self-pay

## 2015-09-11 ENCOUNTER — Inpatient Hospital Stay (HOSPITAL_COMMUNITY)
Admission: EM | Admit: 2015-09-11 | Discharge: 2015-09-13 | DRG: 974 | Disposition: A | Discharge status: Home / Self Care | Payer: Medicare Other | Attending: Family Medicine | Admitting: Family Medicine

## 2015-09-11 DIAGNOSIS — J189 Pneumonia, unspecified organism: Principal | ICD-10-CM | POA: Insufficient documentation

## 2015-09-11 DIAGNOSIS — D649 Anemia, unspecified: Secondary | ICD-10-CM | POA: Diagnosis present

## 2015-09-11 DIAGNOSIS — J45909 Unspecified asthma, uncomplicated: Secondary | ICD-10-CM | POA: Diagnosis present

## 2015-09-11 DIAGNOSIS — Z21 Asymptomatic human immunodeficiency virus [HIV] infection status: Secondary | ICD-10-CM | POA: Diagnosis not present

## 2015-09-11 DIAGNOSIS — B20 Human immunodeficiency virus [HIV] disease: Secondary | ICD-10-CM | POA: Diagnosis not present

## 2015-09-11 DIAGNOSIS — F17210 Nicotine dependence, cigarettes, uncomplicated: Secondary | ICD-10-CM | POA: Diagnosis present

## 2015-09-11 DIAGNOSIS — Z992 Dependence on renal dialysis: Secondary | ICD-10-CM

## 2015-09-11 DIAGNOSIS — R7989 Other specified abnormal findings of blood chemistry: Secondary | ICD-10-CM

## 2015-09-11 DIAGNOSIS — I5023 Acute on chronic systolic (congestive) heart failure: Secondary | ICD-10-CM | POA: Diagnosis not present

## 2015-09-11 DIAGNOSIS — I132 Hypertensive heart and chronic kidney disease with heart failure and with stage 5 chronic kidney disease, or end stage renal disease: Secondary | ICD-10-CM | POA: Diagnosis present

## 2015-09-11 DIAGNOSIS — Z886 Allergy status to analgesic agent status: Secondary | ICD-10-CM | POA: Diagnosis not present

## 2015-09-11 DIAGNOSIS — N186 End stage renal disease: Secondary | ICD-10-CM | POA: Diagnosis present

## 2015-09-11 DIAGNOSIS — R609 Edema, unspecified: Secondary | ICD-10-CM | POA: Diagnosis not present

## 2015-09-11 DIAGNOSIS — E43 Unspecified severe protein-calorie malnutrition: Secondary | ICD-10-CM | POA: Diagnosis present

## 2015-09-11 DIAGNOSIS — R05 Cough: Secondary | ICD-10-CM | POA: Diagnosis present

## 2015-09-11 DIAGNOSIS — J9600 Acute respiratory failure, unspecified whether with hypoxia or hypercapnia: Secondary | ICD-10-CM | POA: Diagnosis present

## 2015-09-11 DIAGNOSIS — I770 Arteriovenous fistula, acquired: Secondary | ICD-10-CM | POA: Diagnosis present

## 2015-09-11 DIAGNOSIS — Z720 Tobacco use: Secondary | ICD-10-CM

## 2015-09-11 DIAGNOSIS — E875 Hyperkalemia: Secondary | ICD-10-CM | POA: Diagnosis present

## 2015-09-11 DIAGNOSIS — R0602 Shortness of breath: Secondary | ICD-10-CM | POA: Diagnosis present

## 2015-09-11 DIAGNOSIS — Z6823 Body mass index (BMI) 23.0-23.9, adult: Secondary | ICD-10-CM | POA: Diagnosis not present

## 2015-09-11 DIAGNOSIS — I10 Essential (primary) hypertension: Secondary | ICD-10-CM | POA: Diagnosis not present

## 2015-09-11 DIAGNOSIS — N19 Unspecified kidney failure: Secondary | ICD-10-CM | POA: Diagnosis not present

## 2015-09-11 DIAGNOSIS — R778 Other specified abnormalities of plasma proteins: Secondary | ICD-10-CM

## 2015-09-11 DIAGNOSIS — J9801 Acute bronchospasm: Secondary | ICD-10-CM

## 2015-09-11 DIAGNOSIS — J4520 Mild intermittent asthma, uncomplicated: Secondary | ICD-10-CM

## 2015-09-11 DIAGNOSIS — R042 Hemoptysis: Secondary | ICD-10-CM | POA: Diagnosis present

## 2015-09-11 DIAGNOSIS — Z8701 Personal history of pneumonia (recurrent): Secondary | ICD-10-CM | POA: Diagnosis not present

## 2015-09-11 DIAGNOSIS — R918 Other nonspecific abnormal finding of lung field: Secondary | ICD-10-CM | POA: Diagnosis not present

## 2015-09-11 DIAGNOSIS — I509 Heart failure, unspecified: Secondary | ICD-10-CM | POA: Diagnosis not present

## 2015-09-11 DIAGNOSIS — R059 Cough, unspecified: Secondary | ICD-10-CM | POA: Diagnosis present

## 2015-09-11 HISTORY — DX: Unspecified asthma, uncomplicated: J45.909

## 2015-09-11 LAB — RAPID URINE DRUG SCREEN, HOSP PERFORMED
Amphetamines: NOT DETECTED
BARBITURATES: NOT DETECTED
BENZODIAZEPINES: NOT DETECTED
COCAINE: POSITIVE — AB
Opiates: NOT DETECTED
TETRAHYDROCANNABINOL: POSITIVE — AB

## 2015-09-11 LAB — BASIC METABOLIC PANEL
Anion gap: 13 (ref 5–15)
BUN: 52 mg/dL — AB (ref 6–20)
CO2: 24 mmol/L (ref 22–32)
CREATININE: 8.64 mg/dL — AB (ref 0.61–1.24)
Calcium: 8.7 mg/dL — ABNORMAL LOW (ref 8.9–10.3)
Chloride: 101 mmol/L (ref 101–111)
GFR calc Af Amer: 9 mL/min — ABNORMAL LOW (ref >60)
GFR calc non Af Amer: 8 mL/min — ABNORMAL LOW (ref >60)
Glucose, Bld: 107 mg/dL — ABNORMAL HIGH (ref 65–99)
POTASSIUM: 4.8 mmol/L (ref 3.5–5.1)
SODIUM: 138 mmol/L (ref 135–145)

## 2015-09-11 LAB — CBC WITH DIFFERENTIAL/PLATELET
Basophils Absolute: 0.1 10*3/uL (ref 0.0–0.1)
Basophils Relative: 1 %
EOS ABS: 0.3 10*3/uL (ref 0.0–0.7)
EOS PCT: 4 %
HCT: 40.3 % (ref 39.0–52.0)
Hemoglobin: 13.9 g/dL (ref 13.0–17.0)
LYMPHS ABS: 1.4 10*3/uL (ref 0.7–4.0)
LYMPHS PCT: 20 %
MCH: 38.8 pg — AB (ref 26.0–34.0)
MCHC: 34.5 g/dL (ref 30.0–36.0)
MCV: 112.6 fL — AB (ref 78.0–100.0)
MONO ABS: 0.4 10*3/uL (ref 0.1–1.0)
MONOS PCT: 5 %
Neutro Abs: 5 10*3/uL (ref 1.7–7.7)
Neutrophils Relative %: 71 %
PLATELETS: 175 10*3/uL (ref 150–400)
RBC: 3.58 MIL/uL — ABNORMAL LOW (ref 4.22–5.81)
RDW: 16.1 % — AB (ref 11.5–15.5)
WBC: 7 10*3/uL (ref 4.0–10.5)

## 2015-09-11 LAB — PROTIME-INR
INR: 1.21 (ref 0.00–1.49)
Prothrombin Time: 15.5 seconds — ABNORMAL HIGH (ref 11.6–15.2)

## 2015-09-11 LAB — TROPONIN I
Troponin I: 0.04 ng/mL — ABNORMAL HIGH (ref <0.031)
Troponin I: 0.04 ng/mL — ABNORMAL HIGH (ref <0.031)
Troponin I: 0.04 ng/mL — ABNORMAL HIGH (ref <0.031)

## 2015-09-11 LAB — MRSA PCR SCREENING: MRSA by PCR: NEGATIVE

## 2015-09-11 LAB — INFLUENZA PANEL BY PCR (TYPE A & B)
H1N1 flu by pcr: NOT DETECTED
Influenza A By PCR: NEGATIVE
Influenza B By PCR: NEGATIVE

## 2015-09-11 LAB — STREP PNEUMONIAE URINARY ANTIGEN: Strep Pneumo Urinary Antigen: NEGATIVE

## 2015-09-11 LAB — BRAIN NATRIURETIC PEPTIDE

## 2015-09-11 LAB — APTT: APTT: 31 s (ref 24–37)

## 2015-09-11 LAB — I-STAT CG4 LACTIC ACID, ED: Lactic Acid, Venous: 1.97 mmol/L (ref 0.5–2.0)

## 2015-09-11 MED ORDER — RALTEGRAVIR POTASSIUM 400 MG PO TABS
400.0000 mg | ORAL_TABLET | Freq: Two times a day (BID) | ORAL | Status: DC
  Administered 2015-09-11 – 2015-09-13 (×5): 400 mg via ORAL
  Filled 2015-09-11 (×9): qty 1

## 2015-09-11 MED ORDER — VANCOMYCIN HCL 500 MG IV SOLR
500.0000 mg | INTRAVENOUS | Status: DC
  Filled 2015-09-11: qty 500

## 2015-09-11 MED ORDER — NICOTINE 21 MG/24HR TD PT24
21.0000 mg | MEDICATED_PATCH | Freq: Every day | TRANSDERMAL | Status: DC
  Administered 2015-09-11: 21 mg via TRANSDERMAL
  Filled 2015-09-11 (×2): qty 1

## 2015-09-11 MED ORDER — AZITHROMYCIN 1 G PO PACK
1.0000 g | PACK | Freq: Once | ORAL | Status: AC
  Administered 2015-09-11: 1 g via ORAL
  Filled 2015-09-11: qty 1

## 2015-09-11 MED ORDER — IPRATROPIUM-ALBUTEROL 0.5-2.5 (3) MG/3ML IN SOLN
3.0000 mL | Freq: Once | RESPIRATORY_TRACT | Status: AC
  Administered 2015-09-11: 3 mL via RESPIRATORY_TRACT
  Filled 2015-09-11: qty 3

## 2015-09-11 MED ORDER — PIPERACILLIN-TAZOBACTAM IN DEX 2-0.25 GM/50ML IV SOLN
2.2500 g | Freq: Three times a day (TID) | INTRAVENOUS | Status: DC
  Administered 2015-09-11 – 2015-09-13 (×5): 2.25 g via INTRAVENOUS
  Filled 2015-09-11 (×10): qty 50

## 2015-09-11 MED ORDER — LISINOPRIL 2.5 MG PO TABS
1.2500 mg | ORAL_TABLET | Freq: Every day | ORAL | Status: DC
  Administered 2015-09-11 – 2015-09-13 (×3): 1.25 mg via ORAL
  Filled 2015-09-11 (×5): qty 0.5

## 2015-09-11 MED ORDER — IPRATROPIUM-ALBUTEROL 0.5-2.5 (3) MG/3ML IN SOLN
3.0000 mL | Freq: Four times a day (QID) | RESPIRATORY_TRACT | Status: DC
  Administered 2015-09-11 (×3): 3 mL via RESPIRATORY_TRACT
  Filled 2015-09-11 (×3): qty 3

## 2015-09-11 MED ORDER — DEXTROSE 5 % IV SOLN
1.0000 g | Freq: Once | INTRAVENOUS | Status: AC
  Administered 2015-09-11: 03:00:00 via INTRAVENOUS
  Filled 2015-09-11: qty 10

## 2015-09-11 MED ORDER — PIPERACILLIN-TAZOBACTAM IN DEX 2-0.25 GM/50ML IV SOLN
INTRAVENOUS | Status: AC
  Filled 2015-09-11: qty 50

## 2015-09-11 MED ORDER — HYDROCODONE-ACETAMINOPHEN 7.5-325 MG PO TABS: 1.0000 | ORAL_TABLET | ORAL | Status: DC | PRN

## 2015-09-11 MED ORDER — HEPARIN SODIUM (PORCINE) 1000 UNIT/ML DIALYSIS: 1000.0000 [IU] | INTRAMUSCULAR | Status: DC | PRN

## 2015-09-11 MED ORDER — LIDOCAINE-PRILOCAINE 2.5-2.5 % EX CREA: 1.0000 "application " | TOPICAL_CREAM | CUTANEOUS | Status: DC | PRN

## 2015-09-11 MED ORDER — ALBUTEROL SULFATE (2.5 MG/3ML) 0.083% IN NEBU: 2.5000 mg | INHALATION_SOLUTION | RESPIRATORY_TRACT | Status: DC | PRN

## 2015-09-11 MED ORDER — SODIUM CHLORIDE 0.9 % IV SOLN: 100.0000 mL | INTRAVENOUS | Status: DC | PRN

## 2015-09-11 MED ORDER — SULFAMETHOXAZOLE-TRIMETHOPRIM 400-80 MG/5ML IV SOLN
320.0000 mg | Freq: Once | INTRAVENOUS | Status: DC
  Filled 2015-09-11: qty 20

## 2015-09-11 MED ORDER — VANCOMYCIN HCL IN DEXTROSE 1-5 GM/200ML-% IV SOLN
1000.0000 mg | Freq: Once | INTRAVENOUS | Status: AC
  Administered 2015-09-11: 1000 mg via INTRAVENOUS
  Filled 2015-09-11: qty 200

## 2015-09-11 MED ORDER — PIPERACILLIN-TAZOBACTAM IN DEX 2-0.25 GM/50ML IV SOLN
2.2500 g | Freq: Once | INTRAVENOUS | Status: AC
  Administered 2015-09-11: 2.25 g via INTRAVENOUS
  Filled 2015-09-11: qty 50

## 2015-09-11 MED ORDER — PENTAFLUOROPROP-TETRAFLUOROETH EX AERO: 1.0000 "application " | INHALATION_SPRAY | CUTANEOUS | Status: DC | PRN

## 2015-09-11 MED ORDER — IOHEXOL 300 MG/ML  SOLN
100.0000 mL | Freq: Once | INTRAMUSCULAR | Status: AC | PRN
  Administered 2015-09-11: 100 mL via INTRAVENOUS

## 2015-09-11 MED ORDER — ALTEPLASE 2 MG IJ SOLR: 2.0000 mg | Freq: Once | INTRAMUSCULAR | Status: DC | PRN

## 2015-09-11 MED ORDER — LIDOCAINE HCL (PF) 1 % IJ SOLN: 5.0000 mL | INTRAMUSCULAR | Status: DC | PRN

## 2015-09-11 MED ORDER — AZITHROMYCIN 1 G PO PACK: 1.0000 g | PACK | Freq: Once | ORAL | Status: DC

## 2015-09-11 MED ORDER — ENSURE ENLIVE PO LIQD
237.0000 mL | Freq: Two times a day (BID) | ORAL | Status: DC
  Administered 2015-09-11 – 2015-09-13 (×3): 237 mL via ORAL

## 2015-09-11 MED ORDER — SULFAMETHOXAZOLE-TRIMETHOPRIM 400-80 MG/5ML IV SOLN
320.0000 mg | INTRAVENOUS | Status: DC
  Administered 2015-09-11 – 2015-09-12 (×2): 320 mg via INTRAVENOUS
  Filled 2015-09-11 (×3): qty 20

## 2015-09-11 MED ORDER — TENOFOVIR DISOPROXIL FUMARATE 300 MG PO TABS
300.0000 mg | ORAL_TABLET | ORAL | Status: DC
  Administered 2015-09-13: 300 mg via ORAL
  Filled 2015-09-11: qty 1

## 2015-09-11 MED ORDER — AZITHROMYCIN 600 MG PO TABS
1200.0000 mg | ORAL_TABLET | ORAL | Status: DC
  Filled 2015-09-11: qty 2

## 2015-09-11 MED ORDER — DM-GUAIFENESIN ER 30-600 MG PO TB12
1.0000 | ORAL_TABLET | Freq: Two times a day (BID) | ORAL | Status: DC
  Administered 2015-09-11 – 2015-09-13 (×5): 1 via ORAL
  Filled 2015-09-11 (×5): qty 1

## 2015-09-11 MED ORDER — PANTOPRAZOLE SODIUM 40 MG PO TBEC
40.0000 mg | DELAYED_RELEASE_TABLET | Freq: Every day | ORAL | Status: DC
  Administered 2015-09-11 – 2015-09-13 (×3): 40 mg via ORAL
  Filled 2015-09-11 (×3): qty 1

## 2015-09-11 MED ORDER — ETRAVIRINE 100 MG PO TABS
200.0000 mg | ORAL_TABLET | Freq: Two times a day (BID) | ORAL | Status: DC
  Administered 2015-09-11 – 2015-09-13 (×5): 200 mg via ORAL
  Filled 2015-09-11 (×9): qty 2

## 2015-09-11 MED ORDER — IBUPROFEN 400 MG PO TABS
200.0000 mg | ORAL_TABLET | Freq: Four times a day (QID) | ORAL | Status: DC | PRN
  Administered 2015-09-12: 200 mg via ORAL
  Filled 2015-09-11: qty 1

## 2015-09-11 MED ORDER — EMTRICITABINE 200 MG PO CAPS
200.0000 mg | ORAL_CAPSULE | ORAL | Status: DC
  Administered 2015-09-13: 200 mg via ORAL
  Filled 2015-09-11: qty 1

## 2015-09-11 MED ORDER — VANCOMYCIN HCL IN DEXTROSE 1-5 GM/200ML-% IV SOLN
1000.0000 mg | Freq: Once | INTRAVENOUS | Status: DC
  Filled 2015-09-11: qty 200

## 2015-09-11 MED ORDER — METOPROLOL SUCCINATE ER 25 MG PO TB24
25.0000 mg | ORAL_TABLET | Freq: Two times a day (BID) | ORAL | Status: DC
  Administered 2015-09-11 – 2015-09-13 (×5): 25 mg via ORAL
  Filled 2015-09-11 (×5): qty 1

## 2015-09-11 MED ORDER — METHYLPREDNISOLONE SODIUM SUCC 40 MG IJ SOLR
40.0000 mg | Freq: Two times a day (BID) | INTRAMUSCULAR | Status: DC
  Administered 2015-09-11 – 2015-09-13 (×5): 40 mg via INTRAVENOUS
  Filled 2015-09-11 (×5): qty 1

## 2015-09-11 NOTE — Consult Note (Signed)
Consult requested by: [facility] Consult requested for pneumonia/hemoptysis:  HPI: This is a 24 year old who has extensive past medical history including AIDS, hypertension, systolic heart failure with EF 10-15% in stage renal disease approaching hemodialysis and chronic tobacco abuse who came to the emergency department with hemoptysis. He complained of shortness of breath and chest pain. He says he has had pneumonia before. His hemoptysis is mixed with sputum. He complains that he is still short of breath. He has not had any fever.  Past Medical History  Diagnosis Date  . HIV (human immunodeficiency virus infection) (HCC)   . ESRD (end stage renal disease) (HCC)   . Staphylococcus aureus bacteremia with sepsis (HCC) 09/09/2014  . A-V fistula (HCC)     Placed on 10/27/2014 at [facility]; for future HD use.   . Systolic dysfunction, left ventricle 12/20/2014    EF 20%   . Asthma      Family History  Problem Relation Age of Onset  . Seizures Father      Social History   Social History  . Marital Status: Single    Spouse Name: N/A  . Number of Children: N/A  . Years of Education: N/A   Social History Main Topics  . Smoking status: Current Every Day Smoker -- 1.00 packs/day for 11 years    Types: Cigarettes    Start date: 01/28/2003  . Smokeless tobacco: Never Used  . Alcohol Use: No  . Drug Use: Yes    Special: Marijuana  . Sexual Activity: Not Currently   Other Topics Concern  . None   Social History Narrative     ROS: He has right-sided chest pain shortness of breath but no fever. He is coughing and he says he is having a little less hemoptysis. No chills. No other new symptoms and the rest per history and physical    Objective: Vital signs in last 24 hours: Temp:  [97.4 F (36.3 C)-97.7 F (36.5 C)] 97.7 F (36.5 C) (11/26 0719) Pulse Rate:  [117-120] 117 (11/26 0719) Resp:  [20-26] 20 (11/26 0719) BP: (120-122)/(74-94) 120/84 mmHg (11/26 0719) SpO2:  [95  %-100 %] 100 % (11/26 0719) Weight:  [58.242 kg (128 lb 6.4 oz)-63.504 kg (140 lb)] 58.242 kg (128 lb 6.4 oz) (11/26 0410) Weight change:     Intake/Output from previous day:    PHYSICAL EXAM He is awake and alert. He looks in mild distress. He is coughing some during the examination. His pupils are reactive. Nose and throat are clear. His neck is supple without masses. Chest shows rhonchi and rales on the right. His heart is regular without gallop. His abdomen is soft. Extremities showed no edema. Central nervous system examination is grossly intact  Lab Results: Basic Metabolic Panel:  Recent Labs  08/67/61 0150  NA 138  K 4.8  CL 101  CO2 24  GLUCOSE 107*  BUN 52*  CREATININE 8.64*  CALCIUM 8.7*   Liver Function Tests: No results for input(s): AST, ALT, ALKPHOS, BILITOT, PROT, ALBUMIN in the last 72 hours. No results for input(s): LIPASE, AMYLASE in the last 72 hours. No results for input(s): AMMONIA in the last 72 hours. CBC:  Recent Labs  09/11/15 0150  WBC 7.0  NEUTROABS 5.0  HGB 13.9  HCT 40.3  MCV 112.6*  PLT 175   Cardiac Enzymes:  Recent Labs  09/11/15 0150 09/11/15 0512  TROPONINI 0.04* 0.04*   BNP: No results for input(s): PROBNP in the last 72 hours. D-Dimer: No  results for input(s): DDIMER in the last 72 hours. CBG: No results for input(s): GLUCAP in the last 72 hours. Hemoglobin A1C: No results for input(s): HGBA1C in the last 72 hours. Fasting Lipid Panel: No results for input(s): CHOL, HDL, LDLCALC, TRIG, CHOLHDL, LDLDIRECT in the last 72 hours. Thyroid Function Tests: No results for input(s): TSH, T4TOTAL, FREET4, T3FREE, THYROIDAB in the last 72 hours. Anemia Panel: No results for input(s): VITAMINB12, FOLATE, FERRITIN, TIBC, IRON, RETICCTPCT in the last 72 hours. Coagulation:  Recent Labs  09/11/15 0512  LABPROT 15.5*  INR 1.21   Urine Drug Screen: Drugs of Abuse     Component Value Date/Time   LABOPIA NONE DETECTED  09/11/2015 0658   COCAINSCRNUR POSITIVE* 09/11/2015 0658   LABBENZ NONE DETECTED 09/11/2015 0658   AMPHETMU NONE DETECTED 09/11/2015 0658   THCU POSITIVE* 09/11/2015 0658   LABBARB NONE DETECTED 09/11/2015 0658    Alcohol Level: No results for input(s): ETH in the last 72 hours. Urinalysis: No results for input(s): COLORURINE, LABSPEC, PHURINE, GLUCOSEU, HGBUR, BILIRUBINUR, KETONESUR, PROTEINUR, UROBILINOGEN, NITRITE, LEUKOCYTESUR in the last 72 hours.  Invalid input(s): APPERANCEUR Misc. Labs:   ABGS: No results for input(s): PHART, PO2ART, TCO2, HCO3 in the last 72 hours.  Invalid input(s): PCO2   MICROBIOLOGY: Recent Results (from the past 240 hour(s))  Culture, blood (routine x 2) Call MD if unable to obtain prior to antibiotics being given     Status: None (Preliminary result)   Collection Time: 09/11/15  5:11 AM  Result Value Ref Range Status   Specimen Description BLOOD RIGHT ARM  Final   Special Requests BOTTLES DRAWN AEROBIC ONLY 6 CC  Final   Culture NO GROWTH < 12 HOURS  Final   Report Status PENDING  Incomplete  Culture, blood (routine x 2) Call MD if unable to obtain prior to antibiotics being given     Status: None (Preliminary result)   Collection Time: 09/11/15  5:15 AM  Result Value Ref Range Status   Specimen Description BLOOD RIGHT ARM  Final   Special Requests BOTTLES DRAWN AEROBIC ONLY 7 CC  Final   Culture NO GROWTH < 12 HOURS  Final   Report Status PENDING  Incomplete  MRSA PCR Screening     Status: None   Collection Time: 09/11/15  6:25 AM  Result Value Ref Range Status   MRSA by PCR NEGATIVE NEGATIVE Final    Comment:        The GeneXpert MRSA Assay (FDA approved for NASAL specimens only), is one component of a comprehensive MRSA colonization surveillance program. It is not intended to diagnose MRSA infection nor to guide or monitor treatment for MRSA infections.     Studies/Results: Dg Chest 2 View  09/11/2015  CLINICAL DATA:  Acute  onset of hemoptysis and shortness of breath. Initial encounter. EXAM: CHEST  2 VIEW COMPARISON:  Chest radiograph from 03/01/2015 FINDINGS: The lungs are well-aerated. Patchy right-sided airspace opacity raises concern for pneumonia or possibly pulmonary alveolar hemorrhage, given the patient's hemoptysis. There is no evidence of pleural effusion or pneumothorax. The heart is mildly enlarged. No acute osseous abnormalities are seen. IMPRESSION: 1. Patchy right-sided airspace opacity raises concern for pneumonia or possibly pulmonary alveolar hemorrhage, given the patient's hemoptysis. 2. Mild cardiomegaly. Electronically Signed   By: [name] M.D.   On: 09/11/2015 00:56   Ct Chest W Contrast  09/11/2015  CLINICAL DATA:  Acute onset of cough. Blood in sputum. Initial encounter. EXAM: CT CHEST WITH CONTRAST  TECHNIQUE: Multidetector CT imaging of the chest was performed during intravenous contrast administration. CONTRAST:  OMNIPAQUE IOHEXOL 300 MG/ML  SOLN COMPARISON:  Chest radiograph performed earlier today at 12:16 a.m. FINDINGS: Vague patchy peribronchovascular airspace opacities are noted bilaterally, with mild sparing of the upper lung lobes. A trace right pleural effusion is noted. Underlying scattered blebs and emphysematous change are noted bilaterally. Underlying interstitial prominence is seen. This is concerning for atypical infection. A small pericardial effusion is identified. The mediastinum is unremarkable in appearance. No mediastinal lymphadenopathy is seen. The great vessels are grossly unremarkable in appearance. The thyroid gland is unremarkable. No axillary lymphadenopathy is identified. Trace ascites is noted about the liver. The visualized portions of the liver and spleen are grossly unremarkable. There is reflux of contrast into the IVC and hepatic veins. No acute osseous abnormalities are identified. IMPRESSION: 1. Vague patchy peribronchovascular airspace opacities  bilaterally, with mild sparing of the upper lung lobes. Trace right pleural effusion noted. Underlying interstitial prominence seen. Findings are concerning for atypical infection. 2. Underlying scattered blebs and emphysematous change noted bilaterally. 3. Small pericardial effusion seen. 4. Trace ascites noted about the liver. Electronically Signed   By: [name] M.D.   On: 09/11/2015 05:21    Medications:  Prior to Admission:  Prescriptions prior to admission  Medication Sig Dispense Refill Last Dose  . albuterol (PROVENTIL HFA;VENTOLIN HFA) 108 (90 BASE) MCG/ACT inhaler Inhale 1-2 puffs into the lungs every 6 (six) hours as needed for wheezing or shortness of breath.   Taking  . emtricitabine (EMTRIVA) 200 MG capsule Take 200 mg by mouth 2 (two) times a week. Taken after dialysis on Mondays and Fridays   Taking  . etravirine (INTELENCE) 100 MG tablet Take 200 mg by mouth every 12 (twelve) hours.   Taking  . ibuprofen (ADVIL,MOTRIN) 200 MG tablet Take 200 mg by mouth every 6 (six) hours as needed for fever or moderate pain.   Taking  . raltegravir (ISENTRESS) 400 MG tablet Take 400 mg by mouth 2 (two) times daily.   Taking  . tenofovir (VIREAD) 300 MG tablet Take 300 mg by mouth every Monday. To be taken on Mondays after dialysis   Taking  . doxycycline (VIBRAMYCIN) 100 MG capsule Take 1 capsule (100 mg total) by mouth 2 (two) times daily. 14 capsule 0   . HYDROcodone-acetaminophen (NORCO) 7.5-325 MG tablet Take 1 tablet by mouth every 4 (four) hours as needed. 20 tablet 0   . lisinopril (PRINIVIL,ZESTRIL) 2.5 MG tablet Take 0.5 tablets (1.25 mg total) by mouth daily. New medication for your heart. 30 tablet 2 Taking  . metoprolol succinate (TOPROL XL) 25 MG 24 hr tablet Take 1 tablet (25 mg total) by mouth 2 (two) times daily. 60 tablet 11   . vancomycin (VANCOCIN) 1 GM/200ML SOLN Inject 200 mLs (1,000 mg total) into the vein every Monday, Wednesday, and Friday with hemodialysis. For 6  more weeks. IV vancomycin started 12/16/2014. Anticipated end date 01/27/2015. 4000 mL  Taking   Scheduled: . dextromethorphan-guaiFENesin  1 tablet Oral BID  . [START ON 09/13/2015] emtricitabine  200 mg Oral Once per day on Mon Fri  . etravirine  200 mg Oral Q12H  . feeding supplement (ENSURE ENLIVE)  237 mL Oral BID BM  . ipratropium-albuterol  3 mL Nebulization Q6H  . lisinopril  1.25 mg Oral Daily  . metoprolol succinate  25 mg Oral BID  . nicotine  21 mg Transdermal Daily  . pantoprazole  40 mg Oral Daily  . piperacillin-tazobactam (ZOSYN)  IV  2.25 g Intravenous Q8H  . raltegravir  400 mg Oral BID  . sulfamethoxazole-trimethoprim  320 mg Intravenous Q24H  . [START ON 09/13/2015] tenofovir  300 mg Oral Q Mon  . [START ON 09/14/2015] vancomycin  500 mg Intravenous Q T,Th,Sa-HD  . vancomycin  1,000 mg Intravenous Once  . vancomycin  1,000 mg Intravenous Once   Continuous:  NUU:VOZDGUY, HYDROcodone-acetaminophen, ibuprofen  Assesment: He is admitted with hemoptysis. I think this is related to pneumonia although he could have had pulmonary alveolar hemorrhage. Since he has purulent sputum I think is more likely pneumonia. He is on appropriate treatment. Agree with treating for PCP. Continue with other medications Principal Problem:   Hemoptysis Active Problems:   HIV (human immunodeficiency virus infection) (HCC)   ESRD on hemodialysis (HCC)   Cigarette smoker   Asthma, chronic   AIDS (HCC)   A-V fistula (HCC)   Acute on chronic systolic CHF (congestive heart failure) (HCC)   Cough   SOB (shortness of breath)   Acute respiratory failure (HCC)   Elevated troponin   Protein-calorie malnutrition, severe (HCC)    Plan: Continue current antibiotics. Add steroids and a moderate dose. Continue with everything else.    LOS: 0 days   Mariadejesus Cade L 09/11/2015, 10:54 AM

## 2015-09-11 NOTE — Progress Notes (Signed)
Dr. Irene Limbo returned call regarding episode in dialysis with cramping and bearing down to relieve cramp during run of V-tach, no new orders, will continue to monitor.

## 2015-09-11 NOTE — Progress Notes (Signed)
PROGRESS NOTE  Dave Estrada UJW:[id number] DOB: 1991/07/22 DOA: 09/11/2015 PCP: No PCP Per Patient  Summary: 24 y.o. male with PMH of AIDS (HIV VL 72 on 08/09/15, and recent CD4 =132), hypertension, systolic congestive heart failure (EF 10-15%), tobacco abuse, ESRD-HD (TTS), who presents with cough, shortness of breath and chest pain. In ED, patient was found to have lactate 1.97, troponin 0.04, WBC 7.0, temperature normal, tachycardia, tachypnea, creatinine 8.64, BUN 52. Chest x-ray showed patchy right-sided airspace opacity raises concern for pneumonia or possibly pulmonary alveolar hemorrhage; mild cardiomegaly. Patient was admitted to inpatient for further eval and treatment.  Assessment/Plan: 1. Acute respiratory failure without hypoxia; hemoptysis, cough, SOB and chest pain, secondary to pneumonia. Given his lower CD4 count (recently 132),at risk for opportunistic infection, such as PCP. Will continue empiric abx and add moderate dose steroids per pulmonology. Blood and sputum cultures pending. Respiratory virus panel pending, influenza negative.  2. AIDS, has been treated by Dr. Valentina Lucks in [place]. HIV VL 72 on 08/09/15, and recent CD4 =132. Continue home medications and follow up HIV VL and CD4 3. ESRD on hemodialysis, last dialyzed on 11/23. Creatinine 8.64, BUN 52, potassium normal. Nephrology will arrange for HD today. Will hold Epogen per nephrology.  4. Tobacco abuse, counseled on the importance of cessation.  5. Asthma, chronic with mild wheezing. Continue breathing treatments with nebs 6. Acute on chronic systolic CHF. ECHO on 07/19/15 showed EF 10-15%. Mild LE edema. Continue metoprolol.  7. Elevated trop: trop 0.04 on admission. Troponins flat. Likely due to ESRD; could consider demand ischemia though doubt. Sallergic to ASA. 8. Protein-calorie malnutrition, severe (HCC); begin regular diet    Overall improved. Continue abx and steroids per pulmonology. Discussed with Dr.  Juanetta Gosling.  HD today per nephrology; discussed with Dr. Kristian Covey  Advance diet  Code Status: Full DVT prophylaxis: SCDs Family Communication: Father and step-mother at bedside. Discussed with patient who understands and has no concerns at this time. Disposition Plan: Anticipate discharge in 48 hours if continues to improve  Brendia Sacks, MD  [facility]  Pager [phone] If 7PM-7AM, please contact night-coverage at [email or web address], password [email or web address] 09/11/2015, 7:07 AM  LOS: 0 days   Consultants:  Nephrology  Pulmonology  Procedures:    Antibiotics:  Vancomycin 11/26>>  Zosyn 11/26>  Bactrim 11/26>>   HPI/Subjective: Feels okay. Still has SOB and a mild cough however his cough is improved when compared to yesterday. Also complains of abdominal pain which he attributes to not eating in a while. Hungry.  Objective: Filed Vitals:   09/11/15 0027 09/11/15 0216 09/11/15 0410 09/11/15 0452  BP: 121/94  122/74   Pulse: 120  119   Temp: 97.4 F (36.3 C)     TempSrc: Oral     Resp: 26  24   Height:  (1.651 m)   (1.651 m)   Weight: 63.504 kg (140 lb)  58.242 kg (128 lb 6.4 oz)   SpO2: 100% 96% 98% 95%   No intake or output data in the 24 hours ending 09/11/15 0707   Filed Weights   09/11/15 0027 09/11/15 0410  Weight: 63.504 kg (140 lb) 58.242 kg (128 lb 6.4 oz)    Exam:     VSS, afebrile General:  Appears calm and comfortable Cardiovascular: Regular rhythm, tachycardic, no m/r/g. 2+ LE edema  Telemetry: ST Respiratory: Generalized wheeze, No r/r. Mildly increased respiratory effort. Speaks in full sentences. Psychiatric: grossly normal mood and affect, speech fluent and appropriate Neurologic: grossly non-focal.  New data reviewed:  BUN 52, Creatinine 8.64  BNP >4500  Troponin 0.04  Lactic acid 1.97   Pertinent data since admission:  BUN 52, Creatinine 8.64  BNP >4500  Troponin stable 0.04  Pending data:  BC  Scheduled  Meds: . dextromethorphan-guaiFENesin  1 tablet Oral BID  . [START ON 09/13/2015] emtricitabine  200 mg Oral Once per day on Mon Fri  . etravirine  200 mg Oral Q12H  . feeding supplement (ENSURE ENLIVE)  237 mL Oral BID BM  . ipratropium-albuterol  3 mL Nebulization Q6H  . lisinopril  1.25 mg Oral Daily  . metoprolol succinate  25 mg Oral BID  . nicotine  21 mg Transdermal Daily  . pantoprazole  40 mg Oral Daily  . raltegravir  400 mg Oral BID  . [START ON 09/13/2015] tenofovir  300 mg Oral Q Mon  . vancomycin  1,000 mg Intravenous Once   Continuous Infusions:   Principal Problem:   Pneumonia Active Problems:   HIV (human immunodeficiency virus infection) (HCC)   ESRD on hemodialysis (HCC)   Cigarette smoker   Asthma, chronic   AIDS (HCC)   A-V fistula (HCC)   Acute on chronic systolic CHF (congestive heart failure) (HCC)   Hemoptysis   Cough   SOB (shortness of breath)   Acute respiratory failure (HCC)   Elevated troponin   Protein-calorie malnutrition, severe (HCC)   Time spent 20 minutes   By signing my name below, I, Burnett Harry attest that this documentation has been prepared under the direction and in the presence of Brendia Sacks, MD Electronically signed: Burnett Harry, Scribe.  09/11/2015 10:55am  I personally performed the services described in this documentation. All medical record entries made by the scribe were at my direction. I have reviewed the chart and agree that the record reflects my personal performance and is accurate and complete. Brendia Sacks, MD

## 2015-09-11 NOTE — Consult Note (Signed)
Reason for Consult: End-stage renal disease Referring Physician: [name] is an 24 y.o. male.  HPI: He is a patient who has history of hypertension, AIDS, end-stage renal disease on maintenance hemodialysis presently came with complaints of some chills, cough, some whitish sputum and hemoptysis for the last 3 days. According to the patient to start on Thursday. This is also associated with difficulty in breathing. He denies any nausea or vomiting. When patient was evaluated he was found to have right patchy infiltrate consistent with possible pneumonia. Presently still has cough and he has episode of hemoptysis.  Past Medical History  Diagnosis Date  . HIV (human immunodeficiency virus infection) ([place])   . ESRD (end stage renal disease) ([place])   . Staphylococcus aureus bacteremia with sepsis ([place]) 09/09/2014  . A-V fistula ([place])     Placed on 10/27/2014 at [facility]; for future HD use.   . Systolic dysfunction, left ventricle 12/20/2014    EF 20%   . Asthma     Past Surgical History  Procedure Laterality Date  . Port a cath revision    . Tee without cardioversion N/A 09/14/2014    Procedure: TRANSESOPHAGEAL ECHOCARDIOGRAM (TEE);  Surgeon: Larey Dresser, MD;  Location: [facility] ENDOSCOPY;  Service: Cardiovascular;  Laterality: N/A;  will be at [facility] by mon  . Insertion of dialysis catheter Left 09/15/2014    Procedure: INSERTION OF DIALYSIS CATHETER;  Surgeon: Serafina Mitchell, MD;  Location: [facility] OR;  Service: Vascular;  Laterality: Left;    Family History  Problem Relation Age of Onset  . Seizures Father     Social History:  reports that he has been smoking Cigarettes.  He started smoking about 12 years ago. He has a 11 pack-year smoking history. He has never used smokeless tobacco. He reports that he uses illicit drugs (Marijuana). He reports that he does not drink alcohol.  Allergies:  Allergies  Allergen Reactions  . Other Anaphylaxis    mushrooms  . Aspirin Other (See  Comments)    Reaction is unknown    Medications: I have reviewed the patient's current medications.  Results for orders placed or performed during the hospital encounter of 09/11/15 (from the past 48 hour(s))  CBC with Differential     Status: Abnormal   Collection Time: 09/11/15  1:50 AM  Result Value Ref Range   WBC 7.0 4.0 - 10.5 K/uL   RBC 3.58 (L) 4.22 - 5.81 MIL/uL   Hemoglobin 13.9 13.0 - 17.0 g/dL   HCT 40.3 39.0 - 52.0 %   MCV 112.6 (H) 78.0 - 100.0 fL   MCH 38.8 (H) 26.0 - 34.0 pg   MCHC 34.5 30.0 - 36.0 g/dL   RDW 16.1 (H) 11.5 - 15.5 %   Platelets 175 150 - 400 K/uL   Neutrophils Relative % 71 %   Neutro Abs 5.0 1.7 - 7.7 K/uL   Lymphocytes Relative 20 %   Lymphs Abs 1.4 0.7 - 4.0 K/uL   Monocytes Relative 5 %   Monocytes Absolute 0.4 0.1 - 1.0 K/uL   Eosinophils Relative 4 %   Eosinophils Absolute 0.3 0.0 - 0.7 K/uL   Basophils Relative 1 %   Basophils Absolute 0.1 0.0 - 0.1 K/uL  Basic metabolic panel     Status: Abnormal   Collection Time: 09/11/15  1:50 AM  Result Value Ref Range   Sodium 138 135 - 145 mmol/L   Potassium 4.8 3.5 - 5.1 mmol/L   Chloride  101 101 - 111 mmol/L   CO2 24 22 - 32 mmol/L   Glucose, Bld 107 (H) 65 - 99 mg/dL   BUN 52 (H) 6 - 20 mg/dL   Creatinine, Ser 8.64 (H) 0.61 - 1.24 mg/dL   Calcium 8.7 (L) 8.9 - 10.3 mg/dL   GFR calc non Af Amer 8 (L) >60 mL/min   GFR calc Af Amer 9 (L) >60 mL/min    Comment: (NOTE) The eGFR has been calculated using the CKD EPI equation. This calculation has not been validated in all clinical situations. eGFR's persistently <60 mL/min signify possible Chronic Kidney Disease.    Anion gap 13 5 - 15  Troponin I     Status: Abnormal   Collection Time: 09/11/15  1:50 AM  Result Value Ref Range   Troponin I 0.04 (H) <0.031 ng/mL    Comment:        PERSISTENTLY INCREASED TROPONIN VALUES IN THE RANGE OF 0.04-0.49 ng/mL CAN BE SEEN IN:       -UNSTABLE ANGINA       -CONGESTIVE HEART FAILURE        -MYOCARDITIS       -CHEST TRAUMA       -ARRYHTHMIAS       -LATE PRESENTING MYOCARDIAL INFARCTION       -COPD   CLINICAL FOLLOW-UP RECOMMENDED.   Brain natriuretic peptide     Status: Abnormal   Collection Time: 09/11/15  1:50 AM  Result Value Ref Range   B Natriuretic Peptide >4500.0 (H) 0.0 - 100.0 pg/mL  I-Stat CG4 Lactic Acid, ED     Status: None   Collection Time: 09/11/15  2:09 AM  Result Value Ref Range   Lactic Acid, Venous 1.97 0.5 - 2.0 mmol/L  Culture, blood (routine x 2) Call MD if unable to obtain prior to antibiotics being given     Status: None (Preliminary result)   Collection Time: 09/11/15  5:11 AM  Result Value Ref Range   Specimen Description BLOOD RIGHT ARM    Special Requests BOTTLES DRAWN AEROBIC ONLY 6 CC    Culture PENDING    Report Status PENDING   Protime-INR     Status: Abnormal   Collection Time: 09/11/15  5:12 AM  Result Value Ref Range   Prothrombin Time 15.5 (H) 11.6 - 15.2 seconds   INR 1.21 0.00 - 1.49  APTT     Status: None   Collection Time: 09/11/15  5:12 AM  Result Value Ref Range   aPTT 31 24 - 37 seconds  Troponin I (q 6hr x 3)     Status: Abnormal   Collection Time: 09/11/15  5:12 AM  Result Value Ref Range   Troponin I 0.04 (H) <0.031 ng/mL    Comment:        PERSISTENTLY INCREASED TROPONIN VALUES IN THE RANGE OF 0.04-0.49 ng/mL CAN BE SEEN IN:       -UNSTABLE ANGINA       -CONGESTIVE HEART FAILURE       -MYOCARDITIS       -CHEST TRAUMA       -ARRYHTHMIAS       -LATE PRESENTING MYOCARDIAL INFARCTION       -COPD   CLINICAL FOLLOW-UP RECOMMENDED.   Culture, blood (routine x 2) Call MD if unable to obtain prior to antibiotics being given     Status: None (Preliminary result)   Collection Time: 09/11/15  5:15 AM  Result Value Ref Range   Specimen Description  BLOOD RIGHT ARM    Special Requests BOTTLES DRAWN AEROBIC ONLY 7 CC    Culture PENDING    Report Status PENDING   MRSA PCR Screening     Status: None   Collection Time:  09/11/15  6:25 AM  Result Value Ref Range   MRSA by PCR NEGATIVE NEGATIVE    Comment:        The GeneXpert MRSA Assay (FDA approved for NASAL specimens only), is one component of a comprehensive MRSA colonization surveillance program. It is not intended to diagnose MRSA infection nor to guide or monitor treatment for MRSA infections.   Urine rapid drug screen (hosp performed)     Status: Abnormal   Collection Time: 09/11/15  6:58 AM  Result Value Ref Range   Opiates NONE DETECTED NONE DETECTED   Cocaine POSITIVE (A) NONE DETECTED   Benzodiazepines NONE DETECTED NONE DETECTED   Amphetamines NONE DETECTED NONE DETECTED   Tetrahydrocannabinol POSITIVE (A) NONE DETECTED   Barbiturates NONE DETECTED NONE DETECTED    Comment:        DRUG SCREEN FOR MEDICAL PURPOSES ONLY.  IF CONFIRMATION IS NEEDED FOR ANY PURPOSE, NOTIFY LAB WITHIN 5 DAYS.        LOWEST DETECTABLE LIMITS FOR URINE DRUG SCREEN Drug Class       Cutoff (ng/mL) Amphetamine      1000 Barbiturate      200 Benzodiazepine   283 Tricyclics       662 Opiates          300 Cocaine          300 THC              50   Influenza panel by PCR (type A & B, H1N1)     Status: None   Collection Time: 09/11/15  6:58 AM  Result Value Ref Range   Influenza A By PCR NEGATIVE NEGATIVE   Influenza B By PCR NEGATIVE NEGATIVE   H1N1 flu by pcr NOT DETECTED NOT DETECTED    Comment:        The Xpert Flu assay (FDA approved for nasal aspirates or washes and nasopharyngeal swab specimens), is intended as an aid in the diagnosis of influenza and should not be used as a sole basis for treatment.     Dg Chest 2 View  09/11/2015  CLINICAL DATA:  Acute onset of hemoptysis and shortness of breath. Initial encounter. EXAM: CHEST  2 VIEW COMPARISON:  Chest radiograph from 03/01/2015 FINDINGS: The lungs are well-aerated. Patchy right-sided airspace opacity raises concern for pneumonia or possibly pulmonary alveolar hemorrhage, given the  patient's hemoptysis. There is no evidence of pleural effusion or pneumothorax. The heart is mildly enlarged. No acute osseous abnormalities are seen. IMPRESSION: 1. Patchy right-sided airspace opacity raises concern for pneumonia or possibly pulmonary alveolar hemorrhage, given the patient's hemoptysis. 2. Mild cardiomegaly. Electronically Signed   By: Garald Balding M.D.   On: 09/11/2015 00:56   Ct Chest W Contrast  09/11/2015  CLINICAL DATA:  Acute onset of cough. Blood in sputum. Initial encounter. EXAM: CT CHEST WITH CONTRAST TECHNIQUE: Multidetector CT imaging of the chest was performed during intravenous contrast administration. CONTRAST:  165m OMNIPAQUE IOHEXOL 300 MG/ML  SOLN COMPARISON:  Chest radiograph performed earlier today at 12:16 a.m. FINDINGS: Vague patchy peribronchovascular airspace opacities are noted bilaterally, with mild sparing of the upper lung lobes. A trace right pleural effusion is noted. Underlying scattered blebs and emphysematous change are noted bilaterally. Underlying  interstitial prominence is seen. This is concerning for atypical infection. A small pericardial effusion is identified. The mediastinum is unremarkable in appearance. No mediastinal lymphadenopathy is seen. The great vessels are grossly unremarkable in appearance. The thyroid gland is unremarkable. No axillary lymphadenopathy is identified. Trace ascites is noted about the liver. The visualized portions of the liver and spleen are grossly unremarkable. There is reflux of contrast into the IVC and hepatic veins. No acute osseous abnormalities are identified. IMPRESSION: 1. Vague patchy peribronchovascular airspace opacities bilaterally, with mild sparing of the upper lung lobes. Trace right pleural effusion noted. Underlying interstitial prominence seen. Findings are concerning for atypical infection. 2. Underlying scattered blebs and emphysematous change noted bilaterally. 3. Small pericardial effusion seen. 4.  Trace ascites noted about the liver. Electronically Signed   By: Garald Balding M.D.   On: 09/11/2015 05:21    Review of Systems  Constitutional: Positive for chills and malaise/fatigue.  HENT: Positive for congestion.   Respiratory: Positive for cough, hemoptysis, sputum production and shortness of breath.   Cardiovascular: Negative for orthopnea and leg swelling.  Gastrointestinal: Negative for nausea and vomiting.   Blood pressure 120/84, pulse 117, temperature 97.7 F (36.5 C), temperature source Axillary, resp. rate 20, height 5' 5"  (1.651 m), weight 128 lb 6.4 oz (58.242 kg), SpO2 100 %. Physical Exam  Constitutional: He is oriented to person, place, and time. No distress.  Eyes: No scleral icterus.  Neck: JVD present.  Cardiovascular: Normal rate and regular rhythm.   Respiratory: No respiratory distress. He has wheezes. He has no rales.  GI: There is no tenderness. There is no rebound.  Musculoskeletal: He exhibits tenderness.  Neurological: He is alert and oriented to person, place, and time.    Assessment/Plan: Problem #1 end-stage renal disease: His status post hemodialysis on Wednesday. Presently he is complaining of difficulty in breathing. However patient at this moment doesn't have any other sinus symptoms with uremia. Problem #2 possible pneumonia Problem #3 history of AIDS Problem #4 anemia: His hemoglobin is above our target goal. Problem #5 metabolic bone disease: His calcium is range. Problem #6 hemoptysis Plan: We'll make arrangements for patient to get dialysis today We'll dialyze him for 4 hours and try to move about 3 L if his blood pressure tolerates. We'll hold Epogen for today. We'll check his basic metabolic panel and phosphorus in the morning.   Dave Estrada S 09/11/2015, 9:40 AM

## 2015-09-11 NOTE — ED Notes (Signed)
Pt states he has had a cough for a couple of days and tonight he noticed some blood in his sputum

## 2015-09-11 NOTE — H&P (Addendum)
[facility] History and Physical  Dave Estrada WUJ:[id number] DOB: Feb 04, 1991 DOA: 09/11/2015  Referring physician: ED physician PCP: No PCP Per Patient  Specialists:   Chief Complaint: cough, hemoptysis, SOB and chest pain  HPI: Dave Estrada is a 24 y.o. male with PMH of AIDS (HIV VL 72 on 08/09/15, and recent CD4 =132), hypertension, systolic congestive heart failure (EF 10-15%), tobacco abuse, ESRD-HD (TTS), who presents with cough, shortness of breath and chest pain.  Patient reports that he has been having cough, shortness of breath and chest pain in the past 2 days. He reports that he coughs up white colored mucus with streaks of blood in it. He has moderate pleuritic chest pain, no tenderness over calf area. Patient states that sometimes his chest pain is burning like pain. No fever or chills. He does not have abdominal pain, diarrhea, symptoms of UTI or unilateral weakness.  In ED, patient was found to have lactate 1.97, troponin 0.04, WBC 7.0, temperature normal, tachycardia, tachypnea, creatinine 8.64, BUN 52. Chest x-ray showed patchy right-sided airspace opacity raises concern for pneumonia or possibly pulmonary alveolar hemorrhage; mild cardiomegaly. Patient is admitted to inpatient for further eval and treatment.  Where does patient live?   At home     Can patient participate in ADLs?  Little    Review of Systems:   General: no fevers, chills, no changes in body weight, has poor appetite, has fatigue HEENT: no blurry vision, hearing changes or sore throat Pulm: has dyspnea, coughing, wheezing CV: has chest pain, no palpitations Abd: no nausea, vomiting, abdominal pain, diarrhea, constipation GU: no dysuria, burning on urination, increased urinary frequency, hematuria  Ext: no leg edema Neuro: no unilateral weakness, numbness, or tingling, no vision change or hearing loss Skin: no rash MSK: No muscle spasm, no deformity, no limitation of range of movement  in spin Heme: No easy bruising.  Travel history: No recent long distant travel.  Allergy:  Allergies  Allergen Reactions  . Other Anaphylaxis    mushrooms  . Aspirin Other (See Comments)    Reaction is unknown    Past Medical History  Diagnosis Date  . HIV (human immunodeficiency virus infection) ([facility])   . ESRD (end stage renal disease) ([facility])   . Staphylococcus aureus bacteremia with sepsis ([facility]) 09/09/2014  . A-V fistula ([facility])     Placed on 10/27/2014 at [facility]; for future HD use.   . Systolic dysfunction, left ventricle 12/20/2014    EF 20%   . Asthma     Past Surgical History  Procedure Laterality Date  . Port a cath revision    . Tee without cardioversion N/A 09/14/2014    Procedure: TRANSESOPHAGEAL ECHOCARDIOGRAM (TEE);  Surgeon: Laurey Morale, MD;  Location: [facility] ENDOSCOPY;  Service: Cardiovascular;  Laterality: N/A;  will be at [facility] by mon  . Insertion of dialysis catheter Left 09/15/2014    Procedure: INSERTION OF DIALYSIS CATHETER;  Surgeon: Nada Libman, MD;  Location: [facility] OR;  Service: Vascular;  Laterality: Left;    Social History:  reports that he has been smoking Cigarettes.  He started smoking about 12 years ago. He has a 11 pack-year smoking history. He has never used smokeless tobacco. He reports that he uses illicit drugs (Marijuana). He reports that he does not drink alcohol.  Family History:  Family History  Problem Relation Age of Onset  . Seizures Father      Prior to Admission medications   Medication Sig Start Date End  Date Taking? Authorizing Provider  albuterol (PROVENTIL HFA;VENTOLIN HFA) 108 (90 BASE) MCG/ACT inhaler Inhale 1-2 puffs into the lungs every 6 (six) hours as needed for wheezing or shortness of breath.   Yes Historical Provider, MD  emtricitabine (EMTRIVA) 200 MG capsule Take 200 mg by mouth 2 (two) times a week. Taken after dialysis on Mondays and Fridays   Yes Historical Provider, MD  etravirine (INTELENCE) 100 MG tablet Take 200 mg by  mouth every 12 (twelve) hours.   Yes Historical Provider, MD  ibuprofen (ADVIL,MOTRIN) 200 MG tablet Take 200 mg by mouth every 6 (six) hours as needed for fever or moderate pain.   Yes Historical Provider, MD  raltegravir (ISENTRESS) 400 MG tablet Take 400 mg by mouth 2 (two) times daily.   Yes Historical Provider, MD  tenofovir (VIREAD) 300 MG tablet Take 300 mg by mouth every Monday. To be taken on Mondays after dialysis   Yes Historical Provider, MD  doxycycline (VIBRAMYCIN) 100 MG capsule Take 1 capsule (100 mg total) by mouth 2 (two) times daily. 08/14/15   Ivery Quale, PA-C  HYDROcodone-acetaminophen (NORCO) 7.5-325 MG tablet Take 1 tablet by mouth every 4 (four) hours as needed. 08/14/15   Ivery Quale, PA-C  lisinopril (PRINIVIL,ZESTRIL) 2.5 MG tablet Take 0.5 tablets (1.25 mg total) by mouth daily. New medication for your heart. 12/21/14   Dave Cousin, MD  metoprolol succinate (TOPROL XL) 25 MG 24 hr tablet Take 1 tablet (25 mg total) by mouth 2 (two) times daily. 06/07/15   Laqueta Linden, MD  vancomycin (VANCOCIN) 1 GM/200ML SOLN Inject 200 mLs (1,000 mg total) into the vein every Monday, Wednesday, and Friday with hemodialysis. For 6 more weeks. IV vancomycin started 12/16/2014. Anticipated end date 01/27/2015. 12/21/14   Dave Cousin, MD    Physical Exam: Filed Vitals:   09/11/15 0027 09/11/15 0216 09/11/15 0410 09/11/15 0452  BP: 121/94  122/74   Pulse: 120  119   Temp: 97.4 F (36.3 C)     TempSrc: Oral     Resp: 26  24   Height: 5\' 5"  (1.651 m)  5\' 5"  (1.651 m)   Weight: 63.504 kg (140 lb)  58.242 kg (128 lb 6.4 oz)   SpO2: 100% 96% 98% 95%   General: Not in acute distress. Cachectic. HEENT:       Eyes: PERRL, EOMI, no scleral icterus.       ENT: No discharge from the ears and nose, no pharynx injection, no tonsillar enlargement.        Neck: postive JVD, no bruit, no mass felt. Heme: No neck lymph node enlargement. Cardiac: S1/S2, RRR, No murmurs, No gallops or  rubs. Pulm: No rales, rhonchi or rubs. Has mild wheezing. Abd: Soft, nondistended, nontender, no rebound pain, no organomegaly, BS present. Ext: 1+ pitting leg edema bilaterally. 2+DP/PT pulse bilaterally. Has functioning AV fistula over left arm. Musculoskeletal: No joint deformities, No joint redness or warmth, no limitation of ROM in spin. Skin: No rashes.  Neuro: Alert, oriented X3, cranial nerves II-XII grossly intact, muscle strength 5/5 in all extremities, sensation to light touch intact. Psych: Patient is not psychotic, no suicidal or hemocidal ideation.  Labs on Admission:  Basic Metabolic Panel:  Recent Labs Lab 09/11/15 0150  NA 138  K 4.8  CL 101  CO2 24  GLUCOSE 107*  BUN 52*  CREATININE 8.64*  CALCIUM 8.7*   Liver Function Tests: No results for input(s): AST, ALT, ALKPHOS, BILITOT, PROT, ALBUMIN in the  last 168 hours. No results for input(s): LIPASE, AMYLASE in the last 168 hours. No results for input(s): AMMONIA in the last 168 hours. CBC:  Recent Labs Lab 09/11/15 0150  WBC 7.0  NEUTROABS 5.0  HGB 13.9  HCT 40.3  MCV 112.6*  PLT 175   Cardiac Enzymes:  Recent Labs Lab 09/11/15 0150  TROPONINI 0.04*    BNP (last 3 results)  Recent Labs  09/11/15 0150  BNP >4500.0*    ProBNP (last 3 results) No results for input(s): PROBNP in the last 8760 hours.  CBG: No results for input(s): GLUCAP in the last 168 hours.  Radiological Exams on Admission: Dg Chest 2 View  09/11/2015  CLINICAL DATA:  Acute onset of hemoptysis and shortness of breath. Initial encounter. EXAM: CHEST  2 VIEW COMPARISON:  Chest radiograph from 03/01/2015 FINDINGS: The lungs are well-aerated. Patchy right-sided airspace opacity raises concern for pneumonia or possibly pulmonary alveolar hemorrhage, given the patient's hemoptysis. There is no evidence of pleural effusion or pneumothorax. The heart is mildly enlarged. No acute osseous abnormalities are seen. IMPRESSION: 1.  Patchy right-sided airspace opacity raises concern for pneumonia or possibly pulmonary alveolar hemorrhage, given the patient's hemoptysis. 2. Mild cardiomegaly. Electronically Signed   By: [name] M.D.   On: 09/11/2015 00:56   Ct Chest W Contrast  09/11/2015  CLINICAL DATA:  Acute onset of cough. Blood in sputum. Initial encounter. EXAM: CT CHEST WITH CONTRAST TECHNIQUE: Multidetector CT imaging of the chest was performed during intravenous contrast administration. CONTRAST:  OMNIPAQUE IOHEXOL 300 MG/ML  SOLN COMPARISON:  Chest radiograph performed earlier today at 12:16 a.m. FINDINGS: Vague patchy peribronchovascular airspace opacities are noted bilaterally, with mild sparing of the upper lung lobes. A trace right pleural effusion is noted. Underlying scattered blebs and emphysematous change are noted bilaterally. Underlying interstitial prominence is seen. This is concerning for atypical infection. A small pericardial effusion is identified. The mediastinum is unremarkable in appearance. No mediastinal lymphadenopathy is seen. The great vessels are grossly unremarkable in appearance. The thyroid gland is unremarkable. No axillary lymphadenopathy is identified. Trace ascites is noted about the liver. The visualized portions of the liver and spleen are grossly unremarkable. There is reflux of contrast into the IVC and hepatic veins. No acute osseous abnormalities are identified. IMPRESSION: 1. Vague patchy peribronchovascular airspace opacities bilaterally, with mild sparing of the upper lung lobes. Trace right pleural effusion noted. Underlying interstitial prominence seen. Findings are concerning for atypical infection. 2. Underlying scattered blebs and emphysematous change noted bilaterally. 3. Small pericardial effusion seen. 4. Trace ascites noted about the liver. Electronically Signed   By: [name] M.D.   On: 09/11/2015 05:21    EKG: Not done in ED, will get one.    Assessment/Plan Principal Problem:   Hemoptysis Active Problems:   HIV (human immunodeficiency virus infection) (HCC)   ESRD on hemodialysis (HCC)   Cigarette smoker   Asthma, chronic   AIDS (HCC)   A-V fistula (HCC)   Acute on chronic systolic CHF (congestive heart failure) (HCC)   Cough   SOB (shortness of breath)   Acute respiratory failure (HCC)   Elevated troponin   Protein-calorie malnutrition, severe (HCC)  Acute respiratory failure, hemoptysis, cough, SOB and chest pain: CXR showed possible PNA. Given his lower CD4 count (recently 132), he is at risk of getting opportunistic infection, such as PCP. He has low risk for PE given signs of DVT and immobility. Worsening CHF may have also contributed.  -  will admit to tele bed - ED started rocephin and azithromycin, will switch to vancomycin and Zosyn - Start Bactrim - check PCP - CT-chest without contrast to further evaluate his lungs.  - Mucinex for cough  - DuoNeb, albuterol, Neb prn for SOB - Urine legionella and S. pneumococcal antigen - Follow up blood culture x2, sputum culture and respiratory virus panel, plus Flu pcr - protonix empirically for possible GERD given burning like chest pain  AIDS: has been treat by Dr. Valentina Lucks in [place]. HIV VL 72 on 08/09/15, and recent CD4 =132. -continue home 4 medications -check HIV Vl and CD4 -f/u with Dr. Valentina Lucks  ESRD on hemodialysis (HCC)-TTS: had dialysis on Wednesday due to Thanksgiving schedule change. Creatinine 8.64, BUN 52, potassium normal. -Please call renal for dialysis in morning.  Tobacco abuse: -Did counseling about importance of quitting smoking -Nicotine patch  Asthma, chronic: has mild wheezing, but does not seem to have acute exacerbation. -Breathing treatment with DuoNeb and albuterol nebulizers  Acute on chronic systolic CHF (congestive heart failure) (HCC): 2-D echo on 07/19/15 showed Ef 10-15%. CHF is mildly exacerbated given 1+ leg edema and positive  JVD. -will ask renal for volume management. -On metoprolol.   Elevated trop: trop 0.04 on admission. No chest pain. Likely due to demanding ischemia due to CHF exacerbation. -trop x 3 -pt is allergic to ASA -continue metoprolol -f/u 2d echo  Protein-calorie malnutrition, severe (HCC); -Ensure  DVT ppx: SCD  Code Status: Full code Family Communication: Yes, patient's girlfriend  at bed side Disposition Plan: Admit to inpatient   Date of Service 09/11/2015    Lorretta Harp [facility] Pager [phone]  If 7PM-7AM, please contact night-coverage [email or web address] Password [facility] 09/11/2015, 5:52 AM

## 2015-09-11 NOTE — Progress Notes (Signed)
Patient remains in dialysis.  Will administer meds on return.

## 2015-09-11 NOTE — Progress Notes (Signed)
ANTIBIOTIC CONSULT NOTE - INITIAL  Pharmacy Consult for Vancomycin, Zosyn, Bactrim IV Indication: pneumonia  Allergies  Allergen Reactions  . Other Anaphylaxis    mushrooms  . Aspirin Other (See Comments)    Reaction is unknown   Patient Measurements: Height: 5\' 5"  (165.1 cm) Weight: 128 lb 6.4 oz (58.242 kg) IBW/kg (Calculated) : 61.5  Vital Signs: Temp: 97.7 F (36.5 C) (11/26 0719) Temp Source: Axillary (11/26 0719) BP: 120/84 mmHg (11/26 0719) Pulse Rate: 117 (11/26 0719) Intake/Output from previous day:   Intake/Output from this shift:    Labs:  Recent Labs  09/11/15 0150  WBC 7.0  HGB 13.9  PLT 175  CREATININE 8.64*   Estimated Creatinine Clearance: 10.7 mL/min (by C-G formula based on Cr of 8.64). No results for input(s): VANCOTROUGH, VANCOPEAK, VANCORANDOM, GENTTROUGH, GENTPEAK, GENTRANDOM, TOBRATROUGH, TOBRAPEAK, TOBRARND, AMIKACINPEAK, AMIKACINTROU, AMIKACIN in the last 72 hours.   Microbiology: Recent Results (from the past 720 hour(s))  Culture, routine-abscess     Status: None   Collection Time: 08/14/15  9:47 PM  Result Value Ref Range Status   Specimen Description ABSCESS BUTTOCKS  Final   Special Requests IMMUNE:COMPROMISED  Final   Gram Stain   Final    NO WBC SEEN NO SQUAMOUS EPITHELIAL CELLS SEEN ABUNDANT GRAM POSITIVE COCCI IN CLUSTERS Performed at [facility]    Culture   Final    ABUNDANT METHICILLIN RESISTANT STAPHYLOCOCCUS AUREUS Note: RIFAMPIN AND GENTAMICIN SHOULD NOT BE USED AS SINGLE DRUGS FOR TREATMENT OF STAPH INFECTIONS. This organism DOES NOT demonstrate inducible Clindamycin resistance in vitro. CRITICAL RESULT CALLED TO, READ BACK BY AND VERIFIED WITH: LYNN MILLER  08/18/15 0840 BY [name] Performed at [facility]    Report Status 08/18/2015 FINAL  Final   Organism ID, Bacteria METHICILLIN RESISTANT STAPHYLOCOCCUS AUREUS  Final      Susceptibility   Methicillin resistant staphylococcus aureus - MIC*    CLINDAMYCIN <=0.25 SENSITIVE Sensitive     ERYTHROMYCIN >=8 RESISTANT Resistant     GENTAMICIN <=0.5 SENSITIVE Sensitive     LEVOFLOXACIN 4 INTERMEDIATE Intermediate     OXACILLIN >=4 RESISTANT Resistant     RIFAMPIN <=0.5 SENSITIVE Sensitive     TRIMETH/SULFA <=10 SENSITIVE Sensitive     VANCOMYCIN 1 SENSITIVE Sensitive     TETRACYCLINE <=1 SENSITIVE Sensitive     * ABUNDANT METHICILLIN RESISTANT STAPHYLOCOCCUS AUREUS  Culture, blood (routine x 2) Call MD if unable to obtain prior to antibiotics being given     Status: None (Preliminary result)   Collection Time: 09/11/15  5:11 AM  Result Value Ref Range Status   Specimen Description BLOOD RIGHT ARM  Final   Special Requests BOTTLES DRAWN AEROBIC ONLY 6 CC  Final   Culture NO GROWTH < 12 HOURS  Final   Report Status PENDING  Incomplete  Culture, blood (routine x 2) Call MD if unable to obtain prior to antibiotics being given     Status: None (Preliminary result)   Collection Time: 09/11/15  5:15 AM  Result Value Ref Range Status   Specimen Description BLOOD RIGHT ARM  Final   Special Requests BOTTLES DRAWN AEROBIC ONLY 7 CC  Final   Culture NO GROWTH < 12 HOURS  Final   Report Status PENDING  Incomplete  MRSA PCR Screening     Status: None   Collection Time: 09/11/15  6:25 AM  Result Value Ref Range Status   MRSA by PCR NEGATIVE NEGATIVE Final    Comment:  The GeneXpert MRSA Assay (FDA approved for NASAL specimens only), is one component of a comprehensive MRSA colonization surveillance program. It is not intended to diagnose MRSA infection nor to guide or monitor treatment for MRSA infections.    Medical History: Past Medical History  Diagnosis Date  . HIV (human immunodeficiency virus infection) (HCC)   . ESRD (end stage renal disease) (HCC)   . Staphylococcus aureus bacteremia with sepsis (HCC) 09/09/2014  . A-V fistula (HCC)     Placed on 10/27/2014 at [facility]; for future HD use.   . 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 Asthma    Vancomycin 11/26 >> Zosyn 11/26 >> Bactrim 11/26 >>  Assessment: He is a patient who has history of hypertension, AIDS, end-stage renal disease on maintenance hemodialysis presently came with complaints of some chills, cough, some whitish sputum and hemoptysis for the last 3 days. According to the patient to start on Thursday. This is also associated with difficulty in breathing. He denies any nausea or vomiting. When patient was evaluated he was found to have right patchy infiltrate consistent with possible pneumonia. Presently still has cough and he has episode of hemoptysis.  Goal of Therapy:  Pre-Hemodialysis Vancomycin level goal range =15-25 mcg/ml Eradicate infection.  Plan:  Vancomycin  IV today x 1 (loading dose) then Vancomycin  IV after each dialysis session Check pre-dialysis level at steady state Zosyn 2.25gm IV q8h Bactrim  (based on TMP component) IV q24hrs (give after HD on dialysis days) Monitor labs, progress, cultures  Valrie Hart A 09/11/2015,10:18 AM

## 2015-09-11 NOTE — ED Provider Notes (Signed)
CSN: [id number]     Arrival date & time 09/10/15  2337 History   First MD Initiated Contact with Patient 09/11/15 0131     Chief Complaint  Patient presents with  . Hemoptysis     (Consider location/radiation/quality/duration/timing/severity/associated sxs/prior Treatment) The history is provided by the patient.   24 year old male with history of HIV infection and end-stage renal disease on hemodialysis comes in with a 2 day history of cough and dyspnea. Cough is productive of sputum with some blood streaks. He denies any chest pain, heaviness, tightness, pressure. He denies fever, chills, sweats. There's been no nausea or vomiting. He is not sure when his half CD4 count is but states that virus was not detected last time it was checked. He is also noted some swelling in his feet and states that he does have history of some heart problems.  Past Medical History  Diagnosis Date  . HIV (human immunodeficiency virus infection) (HCC)   . ESRD (end stage renal disease) (HCC)   . Staphylococcus aureus bacteremia with sepsis (HCC) 09/09/2014  . 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   . Systolic dysfunction, left ventricle 12/20/2014    EF 20%    Past Surgical History  Procedure Laterality Date  . Port a cath revision    . Tee without cardioversion N/A 09/14/2014    Procedure: TRANSESOPHAGEAL ECHOCARDIOGRAM (TEE);  Surgeon: Laurey Morale, MD;  Location: [facility];  Service: Cardiovascular;  Laterality: N/A;  will be at [facility] by mon  . Insertion of dialysis catheter Left 09/15/2014    Procedure: INSERTION OF DIALYSIS CATHETER;  Surgeon: Nada Libman, MD;  Location: [facility] OR;  Service: Vascular;  Laterality: Left;   Family History  Problem Relation Age of Onset  . Seizures Father    Social History  Substance Use Topics  . Smoking status: Current Every Day Smoker -- 1.00 packs/day for 11 years    Types: Cigarettes    Start date: 01/28/2003  . Smokeless  tobacco: Never Used  . Alcohol Use: No    Review of Systems  All other systems reviewed and are negative.     Allergies  Other and Aspirin  Home Medications   Prior to Admission medications   Medication Sig Start Date End Date Taking? Authorizing Provider  albuterol (PROVENTIL HFA;VENTOLIN HFA) 108 (90 BASE) MCG/ACT inhaler Inhale 1-2 puffs into the lungs every 6 (six) hours as needed for wheezing or shortness of breath.   Yes Historical Provider, MD  emtricitabine (EMTRIVA) 200 MG capsule Take 200 mg by mouth 2 (two) times a week. Taken after dialysis on Mondays and Fridays   Yes Historical Provider, MD  etravirine (INTELENCE) 100 MG tablet Take 200 mg by mouth every 12 (twelve) hours.   Yes Historical Provider, MD  ibuprofen (ADVIL,MOTRIN) 200 MG tablet Take 200 mg by mouth every 6 (six) hours as needed for fever or moderate pain.   Yes Historical Provider, MD  raltegravir (ISENTRESS) 400 MG tablet Take 400 mg by mouth 2 (two) times daily.   Yes Historical Provider, MD  tenofovir (VIREAD) 300 MG tablet Take 300 mg by mouth every Monday. To be taken on Mondays after dialysis   Yes Historical Provider, MD  doxycycline (VIBRAMYCIN) 100 MG capsule Take 1 capsule (100 mg total) by mouth 2 (two) times daily. 08/14/15   Ivery Quale, PA-C  HYDROcodone-acetaminophen (NORCO) 7.5-325 MG tablet Take 1 tablet by mouth every 4 (four)  hours as needed. 08/14/15   Ivery Quale, PA-C  lisinopril (PRINIVIL,ZESTRIL) 2.5 MG tablet Take 0.5 tablets (1.25 mg total) by mouth daily. New medication for your heart. 12/21/14   Elliot Cousin, MD  metoprolol succinate (TOPROL XL) 25 MG 24 hr tablet Take 1 tablet (25 mg total) by mouth 2 (two) times daily. 06/07/15   Laqueta Linden, MD  vancomycin (VANCOCIN) 1 GM/200ML SOLN Inject 200 mLs (1,000 mg total) into the vein every Monday, Wednesday, and Friday with hemodialysis. For 6 more weeks. IV vancomycin started 12/16/2014. Anticipated end date 01/27/2015. 12/21/14    Elliot Cousin, MD   BP 121/94 mmHg  Pulse 120  Temp(Src) 97.4 F (36.3 C) (Oral)  Resp 26  Ht 5\' 5"  (1.651 m)  Wt 140 lb (63.504 kg)  BMI 23.30 kg/m2  SpO2 100% Physical Exam  Nursing note and vitals reviewed.  24 year old male, resting comfortably and in no acute distress. Vital signs are significant for tachycardia and tachypnea as well as mild hypertension. Oxygen saturation is 100%, which is normal. Head is normocephalic and atraumatic. PERRLA, EOMI. Oropharynx is clear. Neck is nontender and supple without adenopathy or JVD. Back is nontender and there is no CVA tenderness. Lungs have scattered inspiratory and expiratory wheezing. No rales or rhonchi are heard. Chest is nontender. Heart has regular rate and rhythm without murmur. Abdomen is soft, flat, nontender without masses or hepatosplenomegaly and peristalsis is normoactive. Extremities have 2+ pedal edema, full range of motion is present. AV fistula is present in the left upper arm with thrill present. Skin is warm and dry without rash. Neurologic: Mental status is normal, cranial nerves are intact, there are no motor or sensory deficits.  ED Course  Procedures (including critical care time) Labs Review Results for orders placed or performed during the hospital encounter of 09/11/15  CBC with Differential  Result Value Ref Range   WBC 7.0 4.0 - 10.5 K/uL   RBC 3.58 (L) 4.22 - 5.81 MIL/uL   Hemoglobin 13.9 13.0 - 17.0 g/dL   HCT 09.8 11.9 - 14.7 %   MCV 112.6 (H) 78.0 - 100.0 fL   MCH 38.8 (H) 26.0 - 34.0 pg   MCHC 34.5 30.0 - 36.0 g/dL   RDW 82.9 (H) 56.2 - 13.0 %   Platelets 175 150 - 400 K/uL   Neutrophils Relative % 71 %   Neutro Abs 5.0 1.7 - 7.7 K/uL   Lymphocytes Relative 20 %   Lymphs Abs 1.4 0.7 - 4.0 K/uL   Monocytes Relative 5 %   Monocytes Absolute 0.4 0.1 - 1.0 K/uL   Eosinophils Relative 4 %   Eosinophils Absolute 0.3 0.0 - 0.7 K/uL   Basophils Relative 1 %   Basophils Absolute 0.1 0.0 - 0.1  K/uL  Basic metabolic panel  Result Value Ref Range   Sodium 138 135 - 145 mmol/L   Potassium 4.8 3.5 - 5.1 mmol/L   Chloride 101 101 - 111 mmol/L   CO2 24 22 - 32 mmol/L   Glucose, Bld 107 (H) 65 - 99 mg/dL   BUN 52 (H) 6 - 20 mg/dL   Creatinine, Ser 8.65 (H) 0.61 - 1.24 mg/dL   Calcium 8.7 (L) 8.9 - 10.3 mg/dL   GFR calc non Af Amer 8 (L) >60 mL/min   GFR calc Af Amer 9 (L) >60 mL/min   Anion gap 13 5 - 15  Troponin I  Result Value Ref Range   Troponin I 0.04 (H) <  0.031 ng/mL  Brain natriuretic peptide  Result Value Ref Range   B Natriuretic Peptide >4500.0 (H) 0.0 - 100.0 pg/mL  I-Stat CG4 Lactic Acid, ED  Result Value Ref Range   Lactic Acid, Venous 1.97 0.5 - 2.0 mmol/L   Imaging Review Dg Chest 2 View  09/11/2015  CLINICAL DATA:  Acute onset of hemoptysis and shortness of breath. Initial encounter. EXAM: CHEST  2 VIEW COMPARISON:  Chest radiograph from 03/01/2015 FINDINGS: The lungs are well-aerated. Patchy right-sided airspace opacity raises concern for pneumonia or possibly pulmonary alveolar hemorrhage, given the patient's hemoptysis. There is no evidence of pleural effusion or pneumothorax. The heart is mildly enlarged. No acute osseous abnormalities are seen. IMPRESSION: 1. Patchy right-sided airspace opacity raises concern for pneumonia or possibly pulmonary alveolar hemorrhage, given the patient's hemoptysis. 2. Mild cardiomegaly. Electronically Signed   By: [name] M.D.   On: 09/11/2015 00:56   I have personally reviewed and evaluated these images and lab results as part of my medical decision-making.   MDM   Final diagnoses:  Hemoptysis  Community acquired pneumonia  HIV (human immunodeficiency virus infection) (HCC)  End-stage renal disease on hemodialysis (HCC)  Elevated troponin  Bronchospasm  Peripheral edema    Cough with hemoptysis. Hemoptysis is described as streaks of blood tonight do not feel this is actually clinically significant. He is  noted to be afebrile. This may be related to pneumonia or bronchitis. Presence of pedal edema raises concern for concurrent congestive heart failure. Old records are reviewed and he does have a history of a nonischemic cardiomyopathy as well as HIV. On care everywhere, I have reviewed his records at [facility] and last visit one month ago showed viral load of 79 and CD4 count of 50. This does place him at increased risk for infections. He'll be given albuterol with ipratropium and screening labs obtained. Chest x-ray is consistent with right-sided pneumonia and x-ray appearance does not appear typical of Pneumocystis carinii pneumonia.  Following above-noted treatment, lungs are clear but patient states she is still dyspneic. Troponin is come back slightly elevated which may E related to his underlying renal failure. He is due for dialysis later today. BNP is very high but also likely related to renal failure. Decision is made to admit him on antibiotics and to repeat troponins to get a trend. He is started on ceftriaxone and azithromycin for community-acquired pneumonia, and trimethoprim I-sulfamethoxazole for possible pneumocystis commitment when he pneumonia. Case is discussed with Dr. Clyde Lundborg of [facility] who agrees to admit the patient.  Dione Booze, MD 09/11/15 [phone]

## 2015-09-11 NOTE — Progress Notes (Signed)
Patient returned to room from dialysis, per dialysis nurse patient had cramp in his ribs and was bearing down trying to relieve cramp at the same time central telemetry was calling with 10 beat run v-tach.  Patient asymptomatic on return to room, 3L removed in dialysis.  Dr. Irene Limbo e-paged with report from dialysis nurse.

## 2015-09-11 NOTE — Progress Notes (Signed)
Received phone call from central telemetry, patient had 10 beat run of v-tach at 1725, patient in dialysis, telemetry monitor calling dialysis nurse.  Dr. Irene Limbo e-paged.

## 2015-09-11 NOTE — Procedures (Signed)
   HEMODIALYSIS TREATMENT NOTE:  4 hour heparin-free dialysis completed via left upper arm AVF (16g/antegrade). Goal met: 3 liters removed however pt c/o abdominal and "under my rib" cramping at end of treatment (1725).  Pt was bearing down while protruding stomach and chest in an effort to relieve cramps and, apparently, had a 10-beat run of V-tach.  Cramps were relieved as blood was reinfused with saline boluses and with stretching. Stated, "I always get cramps when they rinse me back."  Hemodynamically stable throughout treatment.  Pt and dialysis RN were notified of V-tach run at 1733.  Pt was asymptomatic, asking, "what's v-tach?" No blood loss. Report called to Hampton Abbot, RN.  Rockwell Alexandria, RN, CDN

## 2015-09-12 ENCOUNTER — Inpatient Hospital Stay (HOSPITAL_COMMUNITY): Payer: Medicare Other

## 2015-09-12 DIAGNOSIS — I509 Heart failure, unspecified: Secondary | ICD-10-CM

## 2015-09-12 LAB — RENAL FUNCTION PANEL
ANION GAP: 15 (ref 5–15)
Albumin: 3 g/dL — ABNORMAL LOW (ref 3.5–5.0)
BUN: 40 mg/dL — ABNORMAL HIGH (ref 6–20)
CHLORIDE: 97 mmol/L — AB (ref 101–111)
CO2: 21 mmol/L — AB (ref 22–32)
CREATININE: 6.94 mg/dL — AB (ref 0.61–1.24)
Calcium: 8.7 mg/dL — ABNORMAL LOW (ref 8.9–10.3)
GFR, EST AFRICAN AMERICAN: 11 mL/min — AB (ref >60)
GFR, EST NON AFRICAN AMERICAN: 10 mL/min — AB (ref >60)
Glucose, Bld: 156 mg/dL — ABNORMAL HIGH (ref 65–99)
POTASSIUM: 4.9 mmol/L (ref 3.5–5.1)
Phosphorus: 5.5 mg/dL — ABNORMAL HIGH (ref 2.5–4.6)
Sodium: 133 mmol/L — ABNORMAL LOW (ref 135–145)

## 2015-09-12 LAB — HEPATITIS B SURFACE ANTIGEN: Hepatitis B Surface Ag: NEGATIVE

## 2015-09-12 MED ORDER — IPRATROPIUM-ALBUTEROL 0.5-2.5 (3) MG/3ML IN SOLN
3.0000 mL | Freq: Four times a day (QID) | RESPIRATORY_TRACT | Status: DC
  Administered 2015-09-12 (×2): 3 mL via RESPIRATORY_TRACT
  Filled 2015-09-12 (×4): qty 3

## 2015-09-12 MED ORDER — FAMOTIDINE 20 MG PO TABS
20.0000 mg | ORAL_TABLET | Freq: Two times a day (BID) | ORAL | Status: DC | PRN
  Administered 2015-09-12: 20 mg via ORAL
  Filled 2015-09-12: qty 1

## 2015-09-12 MED ORDER — CALCIUM CARBONATE ANTACID 500 MG PO CHEW: 1.0000 | CHEWABLE_TABLET | ORAL | Status: DC | PRN

## 2015-09-12 MED ORDER — ONDANSETRON HCL 4 MG/2ML IJ SOLN
4.0000 mg | Freq: Four times a day (QID) | INTRAMUSCULAR | Status: DC | PRN
  Administered 2015-09-12: 4 mg via INTRAVENOUS
  Filled 2015-09-12: qty 2

## 2015-09-12 NOTE — Progress Notes (Signed)
PROGRESS NOTE  Dave Estrada FVO:[id number] DOB: Mar 28, 1991 DOA: 09/11/2015 PCP: No PCP Per Patient  Summary: 24 y.o. male with PMH of AIDS (HIV VL 72 on 08/09/15, and recent CD4 =132), hypertension, systolic congestive heart failure (EF 10-15%), tobacco abuse, ESRD-HD (TTS), who presents with cough, shortness of breath and chest pain. In ED, patient was found to have lactate 1.97, troponin 0.04, WBC 7.0, temperature normal, tachycardia, tachypnea, creatinine 8.64, BUN 52. Chest x-ray showed patchy right-sided airspace opacity raises concern for pneumonia or possibly pulmonary alveolar hemorrhage; mild cardiomegaly. Patient was admitted to inpatient for further eval and treatment.  Assessment/Plan: 1. Acute respiratory failure without hypoxia; hemoptysis, cough, SOB and chest pain, secondary to pneumonia. Much improved, plan to continue abx as per pulmonology as well as steroids. Blood and sputum cultures pending. Respiratory virus panel pending, influenza negative.  2. AIDS, has been treated by Dr. Valentina Lucks in [place]. HIV VL 72 on 08/09/15, and recent CD4 =132. Continue home medications and follow up HIV VL and CD4 3. ESRD on hemodialysis, last dialyzed on 11/26.   4. Tobacco abuse.  5. Asthma, appears stable. Continue nebs as needed. 6. Acute on chronic systolic CHF, now compensated. Echo reviewed, similar to previous: EF 10-15%. Mild LE edema. Continue metoprolol.  7. Elevated troponin.Troponins flat. Secondary to ESRD. Allergic to ASA. 8. Protein-calorie malnutrition, severe. begin regular diet    Overall improved. Continue abx and steroids per pulmonology. Discussed with Dr. Juanetta Gosling,  likely discharge tomorrow if continues to improve.  Code Status: Full DVT prophylaxis: SCDs Family Communication: Father and step-mother at bedside. Discussed with patient who understands and has no concerns at this time. Disposition Plan: Anticipate discharge tomorrow if continues to improve  Brendia Sacks, MD  [facility]  Pager [phone] If 7PM-7AM, please contact night-coverage at [email or web address], password [facility] 09/12/2015, 7:07 AM  LOS: 1 day   Consultants:  Nephrology  Pulmonology  Procedures:  HD 11/26 with removal of 3L  Antibiotics:  Vancomycin 11/26>>  Zosyn 11/26>  Bactrim 11/26>>   HPI/Subjective: Feels good. Abdominal pain has resolved now that he is eating. SOB is improved, still has a mild cough.  Objective: Filed Vitals:   09/11/15 1733 09/11/15 1948 09/11/15 2158 09/12/15 0511  BP: 139/82  109/62 104/73  Pulse: 112  115 106  Temp: 97.8 F (36.6 C)  97.7 F (36.5 C) 97.7 F (36.5 C)  TempSrc: Axillary  Oral Oral  Resp: 16  16 16   Height:      Weight:      SpO2: 100% 98% 98% 100%    Intake/Output Summary (Last 24 hours) at 09/12/15 0707 Last data filed at 09/11/15 1822  Gross per 24 hour  Intake    240 ml  Output   3000 ml  Net  -2760 ml     Filed Weights   09/11/15 0027 09/11/15 0410 09/11/15 1315  Weight: 63.504 kg (140 lb) 58.242 kg (128 lb 6.4 oz) 58.6 kg (129 lb 3 oz)    Exam:     VSS, afebrile General:  Appears calm and comfortable Cardiovascular: RRR, no m/r/g. 1+ LE edema  Telemetry:NSR Respiratory: Normal respiratory effort. No r/r/w. CTA bilaterally. Psychiatric: grossly normal mood and affect, speech fluent and appropriate Neurologic: grossly non-focal.  New data reviewed:  Sodium 133, BMP consistent with ESRD.   Pertinent data since admission:  BUN 52, Creatinine 8.64  BNP >4500  Troponin stable 0.04  Pending data:  BC  Scheduled Meds: . dextromethorphan-guaiFENesin  1 tablet  Oral BID  . [START ON 09/13/2015] emtricitabine  200 mg Oral Once per day on Mon Fri  . etravirine  200 mg Oral Q12H  . feeding supplement (ENSURE ENLIVE)  237 mL Oral BID BM  . ipratropium-albuterol  3 mL Nebulization Q6H WA  . lisinopril  1.25 mg Oral Daily  . methylPREDNISolone (SOLU-MEDROL) injection  40 mg  Intravenous Q12H  . metoprolol succinate  25 mg Oral BID  . nicotine  21 mg Transdermal Daily  . pantoprazole  40 mg Oral Daily  . piperacillin-tazobactam (ZOSYN)  IV  2.25 g Intravenous Q8H  . raltegravir  400 mg Oral BID  . sulfamethoxazole-trimethoprim  320 mg Intravenous Q24H  . [START ON 09/13/2015] tenofovir  300 mg Oral Q Mon  . [START ON 09/14/2015] vancomycin  500 mg Intravenous Q T,Th,Sa-HD  . vancomycin  1,000 mg Intravenous Once   Continuous Infusions:   Principal Problem:   Pneumonia Active Problems:   HIV (human immunodeficiency virus infection) (HCC)   ESRD on hemodialysis (HCC)   Cigarette smoker   Asthma, chronic   AIDS (HCC)   A-V fistula (HCC)   Acute on chronic systolic CHF (congestive heart failure) (HCC)   Hemoptysis   Cough   SOB (shortness of breath)   Acute respiratory failure (HCC)   Elevated troponin   Protein-calorie malnutrition, severe (HCC)   End-stage renal disease on hemodialysis (HCC)   Time spent 20 minutes   By signing my name below, I, Burnett Harry attest that this documentation has been prepared under the direction and in the presence of Brendia Sacks, MD Electronically signed: Burnett Harry, Scribe.  09/12/2015 11:14am  I personally performed the services described in this documentation. All medical record entries made by the scribe were at my direction. I have reviewed the chart and agree that the record reflects my personal performance and is accurate and complete. Brendia Sacks, MD

## 2015-09-12 NOTE — Progress Notes (Signed)
Subjective: Patient states that he is feeling much better. Cough has improved as well as his sputum production. He denies any difficulty breathing today.   Objective: Vital signs in last 24 hours: Temp:  [97.7 F (36.5 C)-97.8 F (36.6 C)] 97.7 F (36.5 C) (11/27 0511) Pulse Rate:  [106-117] 106 (11/27 0511) Resp:  [16] 16 (11/27 0511) BP: (104-139)/(62-87) 104/73 mmHg (11/27 0511) SpO2:  [98 %-100 %] 100 % (11/27 0511) Weight:  [129 lb 3 oz (58.6 kg)] 129 lb 3 oz (58.6 kg) (11/26 1315)  Intake/Output from previous day: 11/26 0701 - 11/27 0700 In: 240 [P.O.:240] Out: 3000  Intake/Output this shift:     Recent Labs  09/11/15 0150  HGB 13.9    Recent Labs  09/11/15 0150  WBC 7.0  RBC 3.58*  HCT 40.3  PLT 175    Recent Labs  09/11/15 0150  NA 138  K 4.8  CL 101  CO2 24  BUN 52*  CREATININE 8.64*  GLUCOSE 107*  CALCIUM 8.7*    Recent Labs  09/11/15 0512  INR 1.21    Generally patient is alert and in no apparent distress Chest: Decreased breath sound bilaterally, no rales or rhonchi His heart exam regular rate and rhythm no murmur Abdomen soft positive bowel sound Extremities no edema  Assessment/Plan: Problem #1 difficulty breathing: Possibly secondary to CHF. His status post hemodialysis yesterday with 3 L fluid removal and patient is improving. Problem #2 pneumonia: Patient is feeling much better and less cough Problem #3 history of AIDS Problem #4 anemia: His hemoglobin is above our target goal. Problem #5 hypertension: His blood pressure is reasonably controlled Problem #6 metabolic bone disease: Calcium is in range Plan: We'll continue his present management We'll check his basic metabolic panel and phosphorus in the morning.   Rachelann Enloe S 09/12/2015, 8:22 AM

## 2015-09-12 NOTE — Progress Notes (Signed)
Subjective: He says he feels substantially better today. He is much less short of breath. No other new complaints. He is still coughing but not coughing up any blood  Objective: Vital signs in last 24 hours: Temp:  [97.7 F (36.5 C)-97.8 F (36.6 C)] 97.7 F (36.5 C) (11/27 0511) Pulse Rate:  [106-117] 106 (11/27 0511) Resp:  [16] 16 (11/27 0511) BP: (104-139)/(62-87) 104/73 mmHg (11/27 0511) SpO2:  [98 %-100 %] 100 % (11/27 0511) Weight:  [58.6 kg (129 lb 3 oz)] 58.6 kg (129 lb 3 oz) (11/26 1315) Weight change: -4.904 kg (-10 lb 13 oz) Last BM Date: 09/11/15  Intake/Output from previous day: 11/26 0701 - 11/27 0700 In: 240 [P.O.:240] Out: 3000   PHYSICAL EXAM General appearance: alert, cooperative and no distress Resp: His chest is clear today Cardio: regular rate and rhythm, S1, S2 normal, no murmur, click, rub or gallop GI: soft, non-tender; bowel sounds normal; no masses,  no organomegaly Extremities: extremities normal, atraumatic, no cyanosis or edema  Lab Results:  Results for orders placed or performed during the hospital encounter of 09/11/15 (from the past 48 hour(s))  CBC with Differential     Status: Abnormal   Collection Time: 09/11/15  1:50 AM  Result Value Ref Range   WBC 7.0 4.0 - 10.5 K/uL   RBC 3.58 (L) 4.22 - 5.81 MIL/uL   Hemoglobin 13.9 13.0 - 17.0 g/dL   HCT 40.3 39.0 - 52.0 %   MCV 112.6 (H) 78.0 - 100.0 fL   MCH 38.8 (H) 26.0 - 34.0 pg   MCHC 34.5 30.0 - 36.0 g/dL   RDW 16.1 (H) 11.5 - 15.5 %   Platelets 175 150 - 400 K/uL   Neutrophils Relative % 71 %   Neutro Abs 5.0 1.7 - 7.7 K/uL   Lymphocytes Relative 20 %   Lymphs Abs 1.4 0.7 - 4.0 K/uL   Monocytes Relative 5 %   Monocytes Absolute 0.4 0.1 - 1.0 K/uL   Eosinophils Relative 4 %   Eosinophils Absolute 0.3 0.0 - 0.7 K/uL   Basophils Relative 1 %   Basophils Absolute 0.1 0.0 - 0.1 K/uL  Basic metabolic panel     Status: Abnormal   Collection Time: 09/11/15  1:50 AM  Result Value Ref  Range   Sodium 138 135 - 145 mmol/L   Potassium 4.8 3.5 - 5.1 mmol/L   Chloride 101 101 - 111 mmol/L   CO2 24 22 - 32 mmol/L   Glucose, Bld 107 (H) 65 - 99 mg/dL   BUN 52 (H) 6 - 20 mg/dL   Creatinine, Ser 8.64 (H) 0.61 - 1.24 mg/dL   Calcium 8.7 (L) 8.9 - 10.3 mg/dL   GFR calc non Af Amer 8 (L) >60 mL/min   GFR calc Af Amer 9 (L) >60 mL/min    Comment: (NOTE) The eGFR has been calculated using the CKD EPI equation. This calculation has not been validated in all clinical situations. eGFR's persistently <60 mL/min signify possible Chronic Kidney Disease.    Anion gap 13 5 - 15  Troponin I     Status: Abnormal   Collection Time: 09/11/15  1:50 AM  Result Value Ref Range   Troponin I 0.04 (H) <0.031 ng/mL    Comment:        PERSISTENTLY INCREASED TROPONIN VALUES IN THE RANGE OF 0.04-0.49 ng/mL CAN BE SEEN IN:       -UNSTABLE ANGINA       -CONGESTIVE HEART FAILURE       -  MYOCARDITIS       -CHEST TRAUMA       -ARRYHTHMIAS       -LATE PRESENTING MYOCARDIAL INFARCTION       -COPD   CLINICAL FOLLOW-UP RECOMMENDED.   Brain natriuretic peptide     Status: Abnormal   Collection Time: 09/11/15  1:50 AM  Result Value Ref Range   B Natriuretic Peptide >4500.0 (H) 0.0 - 100.0 pg/mL  I-Stat CG4 Lactic Acid, ED     Status: None   Collection Time: 09/11/15  2:09 AM  Result Value Ref Range   Lactic Acid, Venous 1.97 0.5 - 2.0 mmol/L  Culture, blood (routine x 2) Call MD if unable to obtain prior to antibiotics being given     Status: None (Preliminary result)   Collection Time: 09/11/15  5:11 AM  Result Value Ref Range   Specimen Description BLOOD RIGHT ARM    Special Requests BOTTLES DRAWN AEROBIC ONLY 6 CC    Culture NO GROWTH 1 DAY    Report Status PENDING   Protime-INR     Status: Abnormal   Collection Time: 09/11/15  5:12 AM  Result Value Ref Range   Prothrombin Time 15.5 (H) 11.6 - 15.2 seconds   INR 1.21 0.00 - 1.49  APTT     Status: None   Collection Time: 09/11/15  5:12  AM  Result Value Ref Range   aPTT 31 24 - 37 seconds  Troponin I (q 6hr x 3)     Status: Abnormal   Collection Time: 09/11/15  5:12 AM  Result Value Ref Range   Troponin I 0.04 (H) <0.031 ng/mL    Comment:        PERSISTENTLY INCREASED TROPONIN VALUES IN THE RANGE OF 0.04-0.49 ng/mL CAN BE SEEN IN:       -UNSTABLE ANGINA       -CONGESTIVE HEART FAILURE       -MYOCARDITIS       -CHEST TRAUMA       -ARRYHTHMIAS       -LATE PRESENTING MYOCARDIAL INFARCTION       -COPD   CLINICAL FOLLOW-UP RECOMMENDED.   Culture, blood (routine x 2) Call MD if unable to obtain prior to antibiotics being given     Status: None (Preliminary result)   Collection Time: 09/11/15  5:15 AM  Result Value Ref Range   Specimen Description BLOOD RIGHT ARM    Special Requests BOTTLES DRAWN AEROBIC ONLY 7 CC    Culture NO GROWTH 1 DAY    Report Status PENDING   MRSA PCR Screening     Status: None   Collection Time: 09/11/15  6:25 AM  Result Value Ref Range   MRSA by PCR NEGATIVE NEGATIVE    Comment:        The GeneXpert MRSA Assay (FDA approved for NASAL specimens only), is one component of a comprehensive MRSA colonization surveillance program. It is not intended to diagnose MRSA infection nor to guide or monitor treatment for MRSA infections.   Urine rapid drug screen (hosp performed)     Status: Abnormal   Collection Time: 09/11/15  6:58 AM  Result Value Ref Range   Opiates NONE DETECTED NONE DETECTED   Cocaine POSITIVE (A) NONE DETECTED   Benzodiazepines NONE DETECTED NONE DETECTED   Amphetamines NONE DETECTED NONE DETECTED   Tetrahydrocannabinol POSITIVE (A) NONE DETECTED   Barbiturates NONE DETECTED NONE DETECTED    Comment:        DRUG SCREEN FOR  MEDICAL PURPOSES ONLY.  IF CONFIRMATION IS NEEDED FOR ANY PURPOSE, NOTIFY LAB WITHIN 5 DAYS.        LOWEST DETECTABLE LIMITS FOR URINE DRUG SCREEN Drug Class       Cutoff (ng/mL) Amphetamine      1000 Barbiturate      200 Benzodiazepine    962 Tricyclics       952 Opiates          300 Cocaine          300 THC              50   Influenza panel by PCR (type A & B, H1N1)     Status: None   Collection Time: 09/11/15  6:58 AM  Result Value Ref Range   Influenza A By PCR NEGATIVE NEGATIVE   Influenza B By PCR NEGATIVE NEGATIVE   H1N1 flu by pcr NOT DETECTED NOT DETECTED    Comment:        The Xpert Flu assay (FDA approved for nasal aspirates or washes and nasopharyngeal swab specimens), is intended as an aid in the diagnosis of influenza and should not be used as a sole basis for treatment.   Strep pneumoniae urinary antigen  (not at [facility])     Status: None   Collection Time: 09/11/15  6:58 AM  Result Value Ref Range   Strep Pneumo Urinary Antigen NEGATIVE NEGATIVE    Comment:        Infection due to S. pneumoniae cannot be absolutely ruled out since the antigen present may be below the detection limit of the test. (NOTE) MC Performed at [facility]   Troponin I (q 6hr x 3)     Status: Abnormal   Collection Time: 09/11/15 10:36 AM  Result Value Ref Range   Troponin I 0.04 (H) <0.031 ng/mL    Comment:        PERSISTENTLY INCREASED TROPONIN VALUES IN THE RANGE OF 0.04-0.49 ng/mL CAN BE SEEN IN:       -UNSTABLE ANGINA       -CONGESTIVE HEART FAILURE       -MYOCARDITIS       -CHEST TRAUMA       -ARRYHTHMIAS       -LATE PRESENTING MYOCARDIAL INFARCTION       -COPD   CLINICAL FOLLOW-UP RECOMMENDED.   Hepatitis B surface antigen     Status: None   Collection Time: 09/11/15 10:36 AM  Result Value Ref Range   Hepatitis B Surface Ag Negative Negative    Comment: (NOTE) Performed At: [facility] [place], [place] [id number] Lindon Romp MD UU:[id number]     ABGS No results for input(s): PHART, PO2ART, TCO2, HCO3 in the last 72 hours.  Invalid input(s): PCO2 CULTURES Recent Results (from the past 240 hour(s))  Culture, blood (routine x 2) Call MD if unable to obtain  prior to antibiotics being given     Status: None (Preliminary result)   Collection Time: 09/11/15  5:11 AM  Result Value Ref Range Status   Specimen Description BLOOD RIGHT ARM  Final   Special Requests BOTTLES DRAWN AEROBIC ONLY 6 CC  Final   Culture NO GROWTH 1 DAY  Final   Report Status PENDING  Incomplete  Culture, blood (routine x 2) Call MD if unable to obtain prior to antibiotics being given     Status: None (Preliminary result)   Collection Time: 09/11/15  5:15 AM  Result  Value Ref Range Status   Specimen Description BLOOD RIGHT ARM  Final   Special Requests BOTTLES DRAWN AEROBIC ONLY 7 CC  Final   Culture NO GROWTH 1 DAY  Final   Report Status PENDING  Incomplete  MRSA PCR Screening     Status: None   Collection Time: 09/11/15  6:25 AM  Result Value Ref Range Status   MRSA by PCR NEGATIVE NEGATIVE Final    Comment:        The GeneXpert MRSA Assay (FDA approved for NASAL specimens only), is one component of a comprehensive MRSA colonization surveillance program. It is not intended to diagnose MRSA infection nor to guide or monitor treatment for MRSA infections.    Studies/Results: Dg Chest 2 View  09/11/2015  CLINICAL DATA:  Acute onset of hemoptysis and shortness of breath. Initial encounter. EXAM: CHEST  2 VIEW COMPARISON:  Chest radiograph from 03/01/2015 FINDINGS: The lungs are well-aerated. Patchy right-sided airspace opacity raises concern for pneumonia or possibly pulmonary alveolar hemorrhage, given the patient's hemoptysis. There is no evidence of pleural effusion or pneumothorax. The heart is mildly enlarged. No acute osseous abnormalities are seen. IMPRESSION: 1. Patchy right-sided airspace opacity raises concern for pneumonia or possibly pulmonary alveolar hemorrhage, given the patient's hemoptysis. 2. Mild cardiomegaly. Electronically Signed   By: Garald Balding M.D.   On: 09/11/2015 00:56   Ct Chest W Contrast  09/11/2015  CLINICAL DATA:  Acute onset of  cough. Blood in sputum. Initial encounter. EXAM: CT CHEST WITH CONTRAST TECHNIQUE: Multidetector CT imaging of the chest was performed during intravenous contrast administration. CONTRAST:  152m OMNIPAQUE IOHEXOL 300 MG/ML  SOLN COMPARISON:  Chest radiograph performed earlier today at 12:16 a.m. FINDINGS: Vague patchy peribronchovascular airspace opacities are noted bilaterally, with mild sparing of the upper lung lobes. A trace right pleural effusion is noted. Underlying scattered blebs and emphysematous change are noted bilaterally. Underlying interstitial prominence is seen. This is concerning for atypical infection. A small pericardial effusion is identified. The mediastinum is unremarkable in appearance. No mediastinal lymphadenopathy is seen. The great vessels are grossly unremarkable in appearance. The thyroid gland is unremarkable. No axillary lymphadenopathy is identified. Trace ascites is noted about the liver. The visualized portions of the liver and spleen are grossly unremarkable. There is reflux of contrast into the IVC and hepatic veins. No acute osseous abnormalities are identified. IMPRESSION: 1. Vague patchy peribronchovascular airspace opacities bilaterally, with mild sparing of the upper lung lobes. Trace right pleural effusion noted. Underlying interstitial prominence seen. Findings are concerning for atypical infection. 2. Underlying scattered blebs and emphysematous change noted bilaterally. 3. Small pericardial effusion seen. 4. Trace ascites noted about the liver. Electronically Signed   By: JGarald BaldingM.D.   On: 09/11/2015 05:21    Medications:  Prior to Admission:  Prescriptions prior to admission  Medication Sig Dispense Refill Last Dose  . albuterol (PROVENTIL HFA;VENTOLIN HFA) 108 (90 BASE) MCG/ACT inhaler Inhale 1-2 puffs into the lungs every 6 (six) hours as needed for wheezing or shortness of breath.   Taking  . emtricitabine (EMTRIVA) 200 MG capsule Take 200 mg by mouth  2 (two) times a week. Taken after dialysis on Mondays and Fridays   Taking  . etravirine (INTELENCE) 100 MG tablet Take 200 mg by mouth every 12 (twelve) hours.   Taking  . ibuprofen (ADVIL,MOTRIN) 200 MG tablet Take 200 mg by mouth every 6 (six) hours as needed for fever or moderate pain.   Taking  .  raltegravir (ISENTRESS) 400 MG tablet Take 400 mg by mouth 2 (two) times daily.   Taking  . tenofovir (VIREAD) 300 MG tablet Take 300 mg by mouth every Monday. To be taken on Mondays after dialysis   Taking  . doxycycline (VIBRAMYCIN) 100 MG capsule Take 1 capsule (100 mg total) by mouth 2 (two) times daily. 14 capsule 0   . HYDROcodone-acetaminophen (NORCO) 7.5-325 MG tablet Take 1 tablet by mouth every 4 (four) hours as needed. 20 tablet 0   . lisinopril (PRINIVIL,ZESTRIL) 2.5 MG tablet Take 0.5 tablets (1.25 mg total) by mouth daily. New medication for your heart. 30 tablet 2 Taking  . metoprolol succinate (TOPROL XL) 25 MG 24 hr tablet Take 1 tablet (25 mg total) by mouth 2 (two) times daily. 60 tablet 11   . vancomycin (VANCOCIN) 1 GM/200ML SOLN Inject 200 mLs (1,000 mg total) into the vein every Monday, Wednesday, and Friday with hemodialysis. For 6 more weeks. IV vancomycin started 12/16/2014. Anticipated end date 01/27/2015. 4000 mL  Taking   Scheduled: . dextromethorphan-guaiFENesin  1 tablet Oral BID  . [START ON 09/13/2015] emtricitabine  200 mg Oral Once per day on Mon Fri  . etravirine  200 mg Oral Q12H  . feeding supplement (ENSURE ENLIVE)  237 mL Oral BID BM  . ipratropium-albuterol  3 mL Nebulization Q6H WA  . lisinopril  1.25 mg Oral Daily  . methylPREDNISolone (SOLU-MEDROL) injection  40 mg Intravenous Q12H  . metoprolol succinate  25 mg Oral BID  . nicotine  21 mg Transdermal Daily  . pantoprazole  40 mg Oral Daily  . piperacillin-tazobactam (ZOSYN)  IV  2.25 g Intravenous Q8H  . raltegravir  400 mg Oral BID  . sulfamethoxazole-trimethoprim  320 mg Intravenous Q24H  . [START ON  09/13/2015] tenofovir  300 mg Oral Q Mon  . [START ON 09/14/2015] vancomycin  500 mg Intravenous Q T,Th,Sa-HD  . vancomycin  1,000 mg Intravenous Once   Continuous:  ZSW:FUXNAT chloride, sodium chloride, alteplase, heparin, HYDROcodone-acetaminophen, ibuprofen, lidocaine (PF), lidocaine-prilocaine, pentafluoroprop-tetrafluoroeth  Assesment: He was admitted with pneumonia. 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 He has multiple other medical problems including end-stage renal disease on hemodialysis, acute on chronic systolic heart failure, elevated troponin, and severe protein calorie malnutrition. He is substantially improved this morning and his chest is actually clear to my exam. 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    LOS: 1 day   Namir Neto L 09/12/2015, 10:31 AM

## 2015-09-12 NOTE — Progress Notes (Signed)
Patient complains of heartburn. Dr. Irene Limbo notified.

## 2015-09-12 NOTE — Progress Notes (Signed)
*  PRELIMINARY RESULTS* Echocardiogram 2D Echocardiogram has been performed.  Doristine Section 09/12/2015, 10:01 AM

## 2015-09-13 ENCOUNTER — Encounter: Payer: Medicare Other | Admitting: Adult Health

## 2015-09-13 LAB — CBC
HCT: 40.6 % (ref 39.0–52.0)
Hemoglobin: 14.5 g/dL (ref 13.0–17.0)
MCH: 39.2 pg — AB (ref 26.0–34.0)
MCHC: 35.7 g/dL (ref 30.0–36.0)
MCV: 109.7 fL — ABNORMAL HIGH (ref 78.0–100.0)
PLATELETS: 191 10*3/uL (ref 150–400)
RBC: 3.7 MIL/uL — AB (ref 4.22–5.81)
RDW: 15.7 % — ABNORMAL HIGH (ref 11.5–15.5)
WBC: 9.7 10*3/uL (ref 4.0–10.5)

## 2015-09-13 LAB — T-HELPER CELLS (CD4) COUNT (NOT AT ARMC)
CD4 % Helper T Cell: 17 % — ABNORMAL LOW (ref 33–55)
CD4 T Cell Abs: 230 /uL — ABNORMAL LOW (ref 400–2700)

## 2015-09-13 LAB — BASIC METABOLIC PANEL
BUN: 56 mg/dL — ABNORMAL HIGH (ref 6–20)
CALCIUM: 8.6 mg/dL — AB (ref 8.9–10.3)
CO2: 14 mmol/L — ABNORMAL LOW (ref 22–32)
CREATININE: 8.38 mg/dL — AB (ref 0.61–1.24)
Chloride: 98 mmol/L — ABNORMAL LOW (ref 101–111)
GFR, EST AFRICAN AMERICAN: 9 mL/min — AB (ref >60)
GFR, EST NON AFRICAN AMERICAN: 8 mL/min — AB (ref >60)
Glucose, Bld: 114 mg/dL — ABNORMAL HIGH (ref 65–99)
Potassium: 6.5 mmol/L (ref 3.5–5.1)
SODIUM: 133 mmol/L — AB (ref 135–145)

## 2015-09-13 LAB — HIV-1 RNA QUANT-NO REFLEX-BLD
HIV 1 RNA Quant: 20 copies/mL
LOG10 HIV-1 RNA: 1.301 log10copy/mL

## 2015-09-13 LAB — PHOSPHORUS: PHOSPHORUS: 7.4 mg/dL — AB (ref 2.5–4.6)

## 2015-09-13 MED ORDER — SODIUM CHLORIDE 0.9 % IV SOLN
500.0000 mg | Freq: Once | INTRAVENOUS | Status: AC
  Administered 2015-09-13: 500 mg via INTRAVENOUS
  Filled 2015-09-13 (×2): qty 500

## 2015-09-13 MED ORDER — SODIUM CHLORIDE 0.9 % IV SOLN: 100.0000 mL | INTRAVENOUS | Status: DC | PRN

## 2015-09-13 MED ORDER — IPRATROPIUM-ALBUTEROL 0.5-2.5 (3) MG/3ML IN SOLN: 3.0000 mL | Freq: Four times a day (QID) | RESPIRATORY_TRACT | Status: DC | PRN

## 2015-09-13 MED ORDER — PIPERACILLIN SOD-TAZOBACTAM SO 2.25 (2-0.25) G IV SOLR
2.2500 g | Freq: Three times a day (TID) | INTRAVENOUS | Status: DC
  Administered 2015-09-13: 2.25 g via INTRAVENOUS
  Filled 2015-09-13 (×4): qty 2.25

## 2015-09-13 MED ORDER — PREDNISONE 10 MG PO TABS: ORAL_TABLET | ORAL | Status: DC

## 2015-09-13 MED ORDER — SEVELAMER CARBONATE 800 MG PO TABS
1600.0000 mg | ORAL_TABLET | Freq: Three times a day (TID) | ORAL | Status: DC
  Administered 2015-09-13 (×2): 1600 mg via ORAL
  Filled 2015-09-13 (×2): qty 2

## 2015-09-13 MED ORDER — SULFAMETHOXAZOLE-TRIMETHOPRIM 800-160 MG PO TABS: 2.0000 | ORAL_TABLET | Freq: Every day | ORAL | Status: DC

## 2015-09-13 MED ORDER — LEVOFLOXACIN 500 MG PO TABS: 500.0000 mg | ORAL_TABLET | ORAL | Status: DC

## 2015-09-13 MED ORDER — SULFAMETHOXAZOLE-TRIMETHOPRIM 800-160 MG PO TABS
2.0000 | ORAL_TABLET | Freq: Every day | ORAL | Status: DC
  Administered 2015-09-13: 2 via ORAL
  Filled 2015-09-13: qty 2

## 2015-09-13 MED ORDER — HEPARIN SODIUM (PORCINE) 1000 UNIT/ML DIALYSIS
1000.0000 [IU] | INTRAMUSCULAR | Status: DC | PRN
  Filled 2015-09-13: qty 1

## 2015-09-13 MED ORDER — LIDOCAINE-PRILOCAINE 2.5-2.5 % EX CREA: 1.0000 "application " | TOPICAL_CREAM | CUTANEOUS | Status: DC | PRN

## 2015-09-13 MED ORDER — PIPERACILLIN-TAZOBACTAM IN DEX 2-0.25 GM/50ML IV SOLN: 2.2500 g | Freq: Three times a day (TID) | INTRAVENOUS | Status: DC

## 2015-09-13 MED ORDER — PENTAFLUOROPROP-TETRAFLUOROETH EX AERO: 1.0000 "application " | INHALATION_SPRAY | CUTANEOUS | Status: DC | PRN

## 2015-09-13 NOTE — Progress Notes (Signed)
PT Cancellation Note  Patient Details Name: Dave Estrada MRN: [id number] DOB: June 06, 1991   Cancelled Treatment:    Reason Eval/Treat Not Completed: PT screened, no needs identified, will sign off   Konrad Penta  PT 09/13/2015, 11:13 AM [phone]

## 2015-09-13 NOTE — Progress Notes (Signed)
Patient discharged home.  IV removed - WNL.  Reviewed medications and DC instructions.  Instructed to follow up with PCP in 1-2 weeks.  No questions at this time.  Stable to DC home, assisted off unit via WC by NT.

## 2015-09-13 NOTE — Procedures (Signed)
   HEMODIALYSIS TREATMENT NOTE:  3.5 hour heparin-free dialysis completed via left upper arm AVF (15g/antegrade). Goal met; tolerated removal of 2 liters without interruption in ultrafiltration. Vancomycin and Zosyn given at end of HD. Hemodynamically stable. All blood was reinfused. Hemostasis was achieved within 10 minutes. Report called to Russ Halo, RN.  Rockwell Alexandria, RN, CDN

## 2015-09-13 NOTE — Care Management Note (Signed)
Case Management Note  Patient Details  Name: Dave Estrada MRN: [id number] Date of Birth: 06/10/1991  Subjective/Objective:                  Pt admitted from home with pneumonia. Pt lives with his father and will return home at discharge. Pt is independent with ADL's. Pt receives dialysis at [facility] in [place].  Action/Plan: No CM needs noted.  Expected Discharge Date:                  Expected Discharge Plan:  Home/Self Care  In-House Referral:  NA  Discharge planning Services  CM Consult  Post Acute Care Choice:  NA Choice offered to:  NA  DME Arranged:    DME Agency:     HH Arranged:    HH Agency:     Status of Service:  Completed, signed off  Medicare Important Message Given:    Date Medicare IM Given:    Medicare IM give by:    Date Additional Medicare IM Given:    Additional Medicare Important Message give by:     If discussed at Long Length of Stay Meetings, dates discussed:    Additional Comments:  Cheryl Flash, RN 09/13/2015, 11:58 AM

## 2015-09-13 NOTE — Progress Notes (Signed)
CRITICAL VALUE ALERT  Critical value received:  K+ - 6.5  Date of notification:  09/13/15  Time of notification:  0740  Critical value read back:Yes.    Nurse who received alert:  Sherrye Payor RN  MD notified (1st page):  Dr. Irene Limbo  Time of first page:  0750  MD notified (2nd page):  Time of second page:  Responding MD:  Dr. Irene Limbo  Time MD responded:  [phone]

## 2015-09-13 NOTE — Discharge Summary (Signed)
Physician Discharge Summary  Dave Estrada ZOX:[id number] DOB: 08/17/1991 DOA: 09/11/2015  PCP: No PCP Per Patient  Admit date: 09/11/2015 Discharge date: 09/13/2015  Recommendations for Outpatient Follow-up:   Follow up resolution of pneumonia      Follow-up Information    Follow up with JASON [name], MD. Schedule an appointment as soon as possible for a visit in 1 week.   Specialty:  Infectious Diseases   Contact information:   [address] [facility] [address] [place] [address] [phone]        Discharge Diagnoses:  1. Acute respiratory failure without hypoxia; hemoptysis, cough, SOB and chest pain, secondary to pneumonia.  2. AIDS. 3. ESRD on hemodialysis.  4. Tobacco abuse.  5. Asthma. 6. Acute on chronic systolic CHF. 7. Elevated troponin.  8. Protein-calorie malnutrition.  Discharge Condition: Improved  Disposition: Discharge home   Diet recommendation: Heart healthy   Filed Weights   09/11/15 0027 09/11/15 0410 09/11/15 1315  Weight: 63.504 kg (140 lb) 58.242 kg (128 lb 6.4 oz) 58.6 kg (129 lb 3 oz)    History of present illness:  24 y.o. male with PMH of AIDS, systolic congestive heart failure (EF 10-15%), tobacco abuse, ESRD-HD (TTS), presented with cough, shortness of breath and chest pain. Chest x-ray showed patchy right-sided airspace opacity raises concern for pneumonia or possibly pulmonary alveolar hemorrhage; mild cardiomegaly. Patient was admitted to inpatient for further eval and treatment.  Hospital Course:  Seen by pulmonology and nephrology in consult. Rapidly improved with IV abx including Bactrim although clinical picture was not strongly suggestive of PCP. Initial CXR revealed patchy right-sided airspace opacity that raised concern for pulmonary alveolar hemorrhage, but determined to be less likely due to purulent sputum per pulmonology. No significant hemoptysis. Pneumonia treated with IV abx and steroids. Upon discharge will be  placed on oral abx and steroid taper. Nephrology followed closely for his end stage renal disease.   1. Acute respiratory failure without hypoxia; hemoptysis, cough, SOB and chest pain, secondary to pneumonia. Much improved. 2. Pneumonia, atypical. PCP considered. 3. AIDS, has been treated by Dr. Valentina Lucks in [place]. HIV VL 72 on 08/09/15, and recent CD4 =132. Continue home medications and follow up HIV VL and CD4. 4. ESRD on hemodialysis, last dialyzed on 11/28. Hyperkalemia treated with HD, counseled on avoiding potassium 5. Elevated. troponin, secondary to ESRD. No ACS. 6. Tobacco abuse.  7. Asthma, appears stable.  8. Acute on chronic systolic CHF, compensated. Echo reviewed, similar to previous: EF 10-15%. Mild LE edema. Continue metoprolol. followup with cardiology as an outpatient. 9. Elevated troponin.Troponins flat. Secondary to ESRD. Allergic to ASA. 10. Protein-calorie malnutrition, severe. Begin regular diet   Consultants:  Nephrology  Pulmonology  Procedures:  HD 11/26 with removal of 3L  ECHO: Study Conclusions - Left ventricle: The cavity size was normal. Wall thickness was increased in a pattern of mild LVH. The estimated ejection fraction was in the range of 10% to 15%. Diffuse hypokinesis. 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 Consider cardiac MRI to further evaluate. Doppler parameters are consistent with restrictive physiology, indicative of decreased left ventricular diastolic compliance and/or increased left atrial pressure. - Aortic valve: Valve area (VTI): 1.91 cm^2. Valve area (Vmax): 2.53 cm^2. - Mitral valve: There was moderate to severe regurgitation. - Left atrium: The atrium was moderately dilated. - Right ventricle: The cavity size was mildly to moderately dilated. - Right atrium: The atrium was moderately dilated. -  Atrial septum: No defect  or patent foramen ovale was identified. - Tricuspid valve: There was moderate regurgitation. The TR VC is 0.4 cm. - Pulmonary arteries: Systolic pressure was moderately increased. PA peak pressure: 49 mm Hg (S). - Inferior vena cava: The vessel was dilated. The respirophasic diameter changes were blunted (< 50%), consistent with elevated central venous pressure. - Pericardium, extracardiac: There is a small circumferential pericardial effusion, adjacent to the RA it is measures 0.8 cm in diastole. Features were not consistent with tamponade physiology. - Technically adequate study.  Antibiotics:  Vancomycin 11/26>> 11/29  Zosyn 11/26> 11/29  Levaquin 11/29 >> 12/5  Bactrim 11/26>> 12/16   Discharge Instructions Discharge Instructions    Activity as tolerated - No restrictions    Complete by:  As directed      Diet - low sodium heart healthy    Complete by:  As directed      Discharge instructions    Complete by:  As directed   Call your physician or seek immediate medical attention for bleeding, fever, shortness of breath, pain or worsening of condition.            Discharge Medication List as of 09/13/2015  6:35 PM    START taking these medications   Details  levofloxacin (LEVAQUIN) 500 MG tablet Take 1 tablet (500 mg total) by mouth every other day. Start 11/29 in evening after dialysis., Starting 09/14/2015, Until Discontinued, Normal    predniSONE (DELTASONE) 10 MG tablet 40 mg PO daily for 3 days, then 20 mg PO daily for 3 days, then 10 mg PO daily for 3 days, then stop., Normal    sulfamethoxazole-trimethoprim (BACTRIM DS,SEPTRA DS) 800-160 MG tablet Take 2 tablets by mouth daily. Last dose 12/16, Starting 09/13/2015, Until Discontinued, Normal      CONTINUE these medications which have NOT CHANGED   Details  albuterol (PROVENTIL HFA;VENTOLIN HFA) 108 (90 BASE) MCG/ACT inhaler Inhale 1-2 puffs into the lungs every 6 (six) hours as needed for  wheezing or shortness of breath., Until Discontinued, Historical Med    emtricitabine (EMTRIVA) 200 MG capsule Take 200 mg by mouth 2 (two) times a week. Taken after dialysis on Tuesday and Thursday, Until Discontinued, Historical Med    etravirine (INTELENCE) 100 MG tablet Take 200 mg by mouth every 12 (twelve) hours., Until Discontinued, Historical Med    ibuprofen (ADVIL,MOTRIN) 200 MG tablet Take 200 mg by mouth every 6 (six) hours as needed for fever or moderate pain., Until Discontinued, Historical Med    lisinopril (PRINIVIL,ZESTRIL) 2.5 MG tablet Take 0.5 tablets (1.25 mg total) by mouth daily. New medication for your heart., Starting 12/21/2014, Until Discontinued, Print    metoprolol succinate (TOPROL XL) 25 MG 24 hr tablet Take 1 tablet (25 mg total) by mouth 2 (two) times daily., Starting 06/07/2015, Until Discontinued, Normal    raltegravir (ISENTRESS) 400 MG tablet Take 400 mg by mouth 2 (two) times daily., Until Discontinued, Historical Med    tenofovir (VIREAD) 300 MG tablet Take 300 mg by mouth once a week. To be taken on Tuesday after dialysis, Until Discontinued, Historical Med      STOP taking these medications     HYDROcodone-acetaminophen (NORCO) 7.5-325 MG tablet        Allergies  Allergen Reactions  . Bee Venom Anaphylaxis  . Other Anaphylaxis    mushrooms  . Aspirin Other (See Comments)    Reaction is unknown    The results of significant diagnostics from this  hospitalization (including imaging, microbiology, ancillary and laboratory) are listed below for reference.    Significant Diagnostic Studies: Dg Chest 2 View  09/11/2015  CLINICAL DATA:  Acute onset of hemoptysis and shortness of breath. Initial encounter. EXAM: CHEST  2 VIEW COMPARISON:  Chest radiograph from 03/01/2015 FINDINGS: The lungs are well-aerated. Patchy right-sided airspace opacity raises concern for pneumonia or possibly pulmonary alveolar hemorrhage, given the patient's hemoptysis. There  is no evidence of pleural effusion or pneumothorax. The heart is mildly enlarged. No acute osseous abnormalities are seen. IMPRESSION: 1. Patchy right-sided airspace opacity raises concern for pneumonia or possibly pulmonary alveolar hemorrhage, given the patient's hemoptysis. 2. Mild cardiomegaly. Electronically Signed   By: [name] M.D.   On: 09/11/2015 00:56   Ct Chest W Contrast  09/11/2015  CLINICAL DATA:  Acute onset of cough. Blood in sputum. Initial encounter. EXAM: CT CHEST WITH CONTRAST TECHNIQUE: Multidetector CT imaging of the chest was performed during intravenous contrast administration. CONTRAST:  OMNIPAQUE IOHEXOL 300 MG/ML  SOLN COMPARISON:  Chest radiograph performed earlier today at 12:16 a.m. FINDINGS: Vague patchy peribronchovascular airspace opacities are noted bilaterally, with mild sparing of the upper lung lobes. A trace right pleural effusion is noted. Underlying scattered blebs and emphysematous change are noted bilaterally. Underlying interstitial prominence is seen. This is concerning for atypical infection. A small pericardial effusion is identified. The mediastinum is unremarkable in appearance. No mediastinal lymphadenopathy is seen. The great vessels are grossly unremarkable in appearance. The thyroid gland is unremarkable. No axillary lymphadenopathy is identified. Trace ascites is noted about the liver. The visualized portions of the liver and spleen are grossly unremarkable. There is reflux of contrast into the IVC and hepatic veins. No acute osseous abnormalities are identified. IMPRESSION: 1. Vague patchy peribronchovascular airspace opacities bilaterally, with mild sparing of the upper lung lobes. Trace right pleural effusion noted. Underlying interstitial prominence seen. Findings are concerning for atypical infection. 2. Underlying scattered blebs and emphysematous change noted bilaterally. 3. Small pericardial effusion seen. 4. Trace ascites noted about the  liver. Electronically Signed   By: [name] M.D.   On: 09/11/2015 05:21    Microbiology: Recent Results (from the past 240 hour(s))  Culture, blood (routine x 2) Call MD if unable to obtain prior to antibiotics being given     Status: None (Preliminary result)   Collection Time: 09/11/15  5:11 AM  Result Value Ref Range Status   Specimen Description BLOOD RIGHT ARM  Final   Special Requests BOTTLES DRAWN AEROBIC ONLY 6 CC  Final   Culture NO GROWTH 1 DAY  Final   Report Status PENDING  Incomplete  Culture, blood (routine x 2) Call MD if unable to obtain prior to antibiotics being given     Status: None (Preliminary result)   Collection Time: 09/11/15  5:15 AM  Result Value Ref Range Status   Specimen Description BLOOD RIGHT ARM  Final   Special Requests BOTTLES DRAWN AEROBIC ONLY 7 CC  Final   Culture NO GROWTH 1 DAY  Final   Report Status PENDING  Incomplete  MRSA PCR Screening     Status: None   Collection Time: 09/11/15  6:25 AM  Result Value Ref Range Status   MRSA by PCR NEGATIVE NEGATIVE Final    Comment:        The GeneXpert MRSA Assay (FDA approved for NASAL specimens only), is one component of a comprehensive MRSA colonization surveillance program. It is not intended to diagnose MRSA  infection nor to guide or monitor treatment for MRSA infections.      Labs: Basic Metabolic Panel:  Recent Labs Lab 09/11/15 0150 09/12/15 0940 09/13/15 0617  NA 138 133* 133*  K 4.8 4.9 6.5*  CL 101 97* 98*  CO2 24 21* 14*  GLUCOSE 107* 156* 114*  BUN 52* 40* 56*  CREATININE 8.64* 6.94* 8.38*  CALCIUM 8.7* 8.7* 8.6*  PHOS  --  5.5* 7.4*   Liver Function Tests:  Recent Labs Lab 09/12/15 0940  ALBUMIN 3.0*  CBC:  Recent Labs Lab 09/11/15 0150 09/13/15 1552  WBC 7.0 9.7  NEUTROABS 5.0  --   HGB 13.9 14.5  HCT 40.3 40.6  MCV 112.6* 109.7*  PLT 175 191   Cardiac Enzymes:  Recent Labs Lab 09/11/15 0150 09/11/15 0512 09/11/15 1036  TROPONINI 0.04*  0.04* 0.04*       Recent Labs  09/11/15 0150  BNP >4500.0*   Principal Problem:   Pneumonia Active Problems:   HIV (human immunodeficiency virus infection) (HCC)   ESRD on hemodialysis (HCC)   Cigarette smoker   Asthma, chronic   AIDS (HCC)   A-V fistula (HCC)   Acute on chronic systolic CHF (congestive heart failure) (HCC)   Hemoptysis   Cough   SOB (shortness of breath)   Acute respiratory failure (HCC)   Elevated troponin   Protein-calorie malnutrition, severe (HCC)   End-stage renal disease on hemodialysis (HCC)   Time coordinating discharge: 35 minutes   Signed:  Brendia Sacks, MD [facility] 09/13/2015, 8:33 AM    By signing my name below, I, [name] attest that this documentation has been prepared under the direction and in the presence of Brendia Sacks, MD Electronically signed: [name]  09/13/2015   I personally performed the services described in this documentation. All medical record entries made by the scribe were at my direction. I have reviewed the chart and agree that the record reflects my personal performance and is accurate and complete. Brendia Sacks, MD

## 2015-09-13 NOTE — Progress Notes (Signed)
PROGRESS NOTE  Dave Estrada ZOX:[id number] DOB: May 25, 1991 DOA: 09/11/2015 PCP: [name] [name], MD  Summary: 24 y.o. male with PMH of AIDS (HIV VL 72 on 08/09/15, and recent CD4 =132), hypertension, systolic congestive heart failure (EF 10-15%), tobacco abuse, ESRD-HD (TTS), who presents with cough, shortness of breath and chest pain. In ED, patient was found to have lactate 1.97, troponin 0.04, WBC 7.0, temperature normal, tachycardia, tachypnea, creatinine 8.64, BUN 52. Chest x-ray showed patchy right-sided airspace opacity raises concern for pneumonia or possibly pulmonary alveolar hemorrhage; mild cardiomegaly. Patient was admitted to inpatient for further eval and treatment.  Assessment/Plan: 1. Acute respiratory failure without hypoxia; hemoptysis, cough, SOB and chest pain, secondary to pneumonia. Much improved. 2. Pneumonia, atypical. PCP considered. 3. AIDS, has been treated by Dr. Valentina Lucks in [place]. HIV VL 72 on 08/09/15, and recent CD4 =132. Continue home medications and follow up HIV VL and CD4. 4. ESRD on hemodialysis, last dialyzed on 11/28. Hyperkalemia treated with HD, counseled on avoiding potassium 5. Tobacco abuse.  6. Asthma, appears stable.  7. Acute on chronic systolic CHF, compensated. Echo reviewed, similar to previous: EF 10-15%. Mild LE edema. Continue metoprolol. followup with cardiology as an outpatient. 8. Elevated troponin.Troponins flat. Secondary to ESRD. Allergic to ASA. 9. Protein-calorie malnutrition, severe. Begin regular diet    Doing very well. Home today on Levaquin, Bactrim, steroid taper per discussion with Dr. Juanetta Gosling.  Code Status: Full DVT prophylaxis: SCDs Family Communication: Father and step-mother at bedside. Discussed with patient who understands and has no concerns at this time. Disposition Plan: Anticipate discharge tomorrow if continues to improve  Brendia Sacks, MD  [facility]  Pager [phone] If 7PM-7AM, please contact  night-coverage at [email or web address], password [email or web address] 09/13/2015, 7:18 AM  LOS: 2 days   Consultants:  Nephrology  Pulmonology  Procedures:  HD 11/26 with removal of 3L  ECHO: Study Conclusions - Left ventricle: The cavity size was normal. Wall thickness was increased in a pattern of mild LVH. The estimated ejection fraction was in the range of 10% to 15%. Diffuse hypokinesis. 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 Consider cardiac MRI to further evaluate. Doppler parameters are consistent with restrictive physiology, indicative of decreased left ventricular diastolic compliance and/or increased left atrial pressure. - Aortic valve: Valve area (VTI): 1.91 cm^2. Valve area (Vmax): 2.53 cm^2. - Mitral valve: There was moderate to severe regurgitation. - Left atrium: The atrium was moderately dilated. - Right ventricle: The cavity size was mildly to moderately dilated. - Right atrium: The atrium was moderately dilated. - Atrial septum: No defect or patent foramen ovale was identified. - Tricuspid valve: There was moderate regurgitation. The TR VC is 0.4 cm. - Pulmonary arteries: Systolic pressure was moderately increased. PA peak pressure: 49 mm Hg (S). - Inferior vena cava: The vessel was dilated. The respirophasic diameter changes were blunted (< 50%), consistent with elevated central venous pressure. - Pericardium, extracardiac: There is a small circumferential pericardial effusion, adjacent to the RA it is measures 0.8 cm in diastole. Features were not consistent with tamponade physiology. - Technically adequate study.   Antibiotics:  Vancomycin 11/26>> 11/29  Zosyn 11/26> 11/29  Levaquin 11/29 >> 12/5  Bactrim 11/26>> 12/16    HPI/Subjective: Feeling good, wants to go home.   Objective: Filed Vitals:   09/12/15 1430 09/12/15 2134 09/12/15 2143  09/13/15 0549  BP: 118/79 103/72  97/77  Pulse: 106 105  102  Temp: 98.2 F (  36.8 C)   97.6 F (36.4 C)  TempSrc: Oral   Oral  Resp: 16     Height:      Weight:      SpO2: 99% 100% 98% 100%    Intake/Output Summary (Last 24 hours) at 09/13/15 0718 Last data filed at 09/12/15 1849  Gross per 24 hour  Intake    720 ml  Output      0 ml  Net    720 ml     Filed Weights   09/11/15 0027 09/11/15 0410 09/11/15 1315  Weight: 63.504 kg (140 lb) 58.242 kg (128 lb 6.4 oz) 58.6 kg (129 lb 3 oz)    Exam:     VSS, afebrile General:  Appears comfortable, calm. Eyes: PERRL, normal lids, irises ENT: grossly normal hearing, lips, tongue Neck: no LAD, masses, thyromegaly Cardiovascular: Regular rate and rhythm, no murmur, rub or gallop. No lower extremity edema. Telemetry: Sinus rhythm, no arrhythmias  Respiratory: Clear to auscultation bilaterally, no wheezes, rales or rhonchi. Normal respiratory effort. Abdomen: soft, ntnd Skin: no rash or induration  Musculoskeletal: grossly normal tone bilateral upper and lower extremities Psychiatric: grossly normal mood and affect, speech fluent and appropriate Neurologic: grossly non-focal.  New data reviewed:    Pertinent data since admission:  No new data  Pending data:  BC  Scheduled Meds: . dextromethorphan-guaiFENesin  1 tablet Oral BID  . emtricitabine  200 mg Oral Once per day on Mon Fri  . etravirine  200 mg Oral Q12H  . feeding supplement (ENSURE ENLIVE)  237 mL Oral BID BM  . ipratropium-albuterol  3 mL Nebulization Q6H WA  . lisinopril  1.25 mg Oral Daily  . methylPREDNISolone (SOLU-MEDROL) injection  40 mg Intravenous Q12H  . metoprolol succinate  25 mg Oral BID  . nicotine  21 mg Transdermal Daily  . pantoprazole  40 mg Oral Daily  . piperacillin-tazobactam (ZOSYN)  IV  2.25 g Intravenous Q8H  . raltegravir  400 mg Oral BID  . sulfamethoxazole-trimethoprim  320 mg Intravenous Q24H  . tenofovir  300 mg Oral Q Mon   . [START ON 09/14/2015] vancomycin  500 mg Intravenous Q T,Th,Sa-HD  . vancomycin  1,000 mg Intravenous Once   Continuous Infusions:   Principal Problem:   Pneumonia Active Problems:   HIV (human immunodeficiency virus infection) (HCC)   ESRD on hemodialysis (HCC)   Cigarette smoker   Asthma, chronic   AIDS (HCC)   A-V fistula (HCC)   Acute on chronic systolic CHF (congestive heart failure) (HCC)   Hemoptysis   Cough   SOB (shortness of breath)   Acute respiratory failure (HCC)   Elevated troponin   Protein-calorie malnutrition, severe (HCC)   End-stage renal disease on hemodialysis (HCC)      By signing my name below, I, Burnett Harry attest that this documentation has been prepared under the direction and in the presence of Brendia Sacks, MD Electronically signed: Burnett Harry, Scribe.  09/13/2015 11:14am  I personally performed the services described in this documentation. All medical record entries made by the scribe were at my direction. I have reviewed the chart and agree that the record reflects my personal performance and is accurate and complete. Brendia Sacks, MD

## 2015-09-13 NOTE — Progress Notes (Signed)
He says he feels much better. No new complaints. His breathing is good. His potassium level is up this morning. From a strictly pulmonary point of view I think he could be discharged when he is otherwise medically stable. He will need chest x-ray to document clearing of his pneumonia but that can be arranged with his infectious disease doctor at [facility]

## 2015-09-13 NOTE — Progress Notes (Signed)
Cardiology Office Note   Date:  09/13/2015   ID:  Dave Estrada, DOB 04/14/1991, MRN [id number]  PCP:  No PCP Per Patient  Cardiologist: Inis Sizer, NP   No chief complaint on file.   ERROR Cancelled Appointment

## 2015-09-13 NOTE — Progress Notes (Signed)
Subjective: Patient continued to feel better. Patient denies any nausea or vomiting. He denies also any difficulty breathing.   Objective: Vital signs in last 24 hours: Temp:  [97.6 F (36.4 C)-98.2 F (36.8 C)] 97.6 F (36.4 C) (11/28 0549) Pulse Rate:  [102-106] 102 (11/28 0549) Resp:  [16] 16 (11/27 1430) BP: (97-118)/(72-79) 97/77 mmHg (11/28 0549) SpO2:  [98 %-100 %] 100 % (11/28 0549)  Intake/Output from previous day: 11/27 0701 - 11/28 0700 In: 720 [P.O.:720] Out: -  Intake/Output this shift:     Recent Labs  09/11/15 0150  HGB 13.9    Recent Labs  09/11/15 0150  WBC 7.0  RBC 3.58*  HCT 40.3  PLT 175    Recent Labs  09/12/15 0940 09/13/15 0617  NA 133* 133*  K 4.9 6.5*  CL 97* 98*  CO2 21* 14*  BUN 40* 56*  CREATININE 6.94* 8.38*  GLUCOSE 156* 114*  CALCIUM 8.7* 8.6*    Recent Labs  09/11/15 0512  INR 1.21    Generally patient is alert and in no apparent distress Chest: Decreased breath sound bilaterally, no rales or rhonchi His heart exam regular rate and rhythm no murmur Abdomen soft positive bowel sound Extremities no edema  Assessment/Plan: Problem #1 difficulty breathing: Possibly secondary to CHF. patient presently denies any difficulty breathing or orthopnea.  Problem #2 pneumonia: Patient is feeling much better and less cough. Patient is a febrile with normal white blood cell count.  Problem #3 history of AIDS Problem #4 anemia: His hemoglobin is above our target goal. patient is off Epogen.  Problem #5 hypertension: His blood pressure is reasonably controlled Problem #6 metabolic bone disease: Calcium is in range but his phosphorus is very high. Problem #7 hyperkalemia: This is secondary to high potassium intake.  Plan:  1] We'll make arrangements for patient to get dialysis today. And then he will be followed as an outpatient for his regular schedule. 2] patient advised to decrease his potassium intake.  3] we'll start patient  on Renvela 800 mg 2 tablets po tid with meals and 1 with snack   Akaylah Lalley S 09/13/2015, 9:06 AM

## 2015-09-14 LAB — RESPIRATORY VIRUS PANEL
ADENOVIRUS: NEGATIVE
Influenza A: NEGATIVE
Influenza B: NEGATIVE
Metapneumovirus: NEGATIVE
PARAINFLUENZA 1 A: NEGATIVE
Parainfluenza 2: NEGATIVE
Parainfluenza 3: NEGATIVE
RESPIRATORY SYNCYTIAL VIRUS B: NEGATIVE
RHINOVIRUS: NEGATIVE
Respiratory Syncytial Virus A: NEGATIVE

## 2015-09-15 DIAGNOSIS — Z992 Dependence on renal dialysis: Secondary | ICD-10-CM | POA: Diagnosis not present

## 2015-09-15 DIAGNOSIS — N186 End stage renal disease: Secondary | ICD-10-CM | POA: Diagnosis not present

## 2015-09-16 DIAGNOSIS — D631 Anemia in chronic kidney disease: Secondary | ICD-10-CM | POA: Diagnosis not present

## 2015-09-16 DIAGNOSIS — D696 Thrombocytopenia, unspecified: Secondary | ICD-10-CM | POA: Diagnosis not present

## 2015-09-16 DIAGNOSIS — D509 Iron deficiency anemia, unspecified: Secondary | ICD-10-CM | POA: Diagnosis not present

## 2015-09-16 DIAGNOSIS — N2581 Secondary hyperparathyroidism of renal origin: Secondary | ICD-10-CM | POA: Diagnosis not present

## 2015-09-16 DIAGNOSIS — I10 Essential (primary) hypertension: Secondary | ICD-10-CM | POA: Diagnosis not present

## 2015-09-16 DIAGNOSIS — N186 End stage renal disease: Secondary | ICD-10-CM | POA: Diagnosis not present

## 2015-09-16 DIAGNOSIS — E8770 Fluid overload, unspecified: Secondary | ICD-10-CM | POA: Diagnosis not present

## 2015-09-16 LAB — CULTURE, BLOOD (ROUTINE X 2)
Culture: NO GROWTH
Culture: NO GROWTH

## 2015-09-18 DIAGNOSIS — D696 Thrombocytopenia, unspecified: Secondary | ICD-10-CM | POA: Diagnosis not present

## 2015-09-18 DIAGNOSIS — N186 End stage renal disease: Secondary | ICD-10-CM | POA: Diagnosis not present

## 2015-09-18 DIAGNOSIS — D631 Anemia in chronic kidney disease: Secondary | ICD-10-CM | POA: Diagnosis not present

## 2015-09-18 DIAGNOSIS — I10 Essential (primary) hypertension: Secondary | ICD-10-CM | POA: Diagnosis not present

## 2015-09-18 DIAGNOSIS — N2581 Secondary hyperparathyroidism of renal origin: Secondary | ICD-10-CM | POA: Diagnosis not present

## 2015-09-21 DIAGNOSIS — N186 End stage renal disease: Secondary | ICD-10-CM | POA: Diagnosis not present

## 2015-09-21 DIAGNOSIS — R0602 Shortness of breath: Secondary | ICD-10-CM | POA: Diagnosis not present

## 2015-09-21 DIAGNOSIS — I517 Cardiomegaly: Secondary | ICD-10-CM | POA: Diagnosis not present

## 2015-09-21 DIAGNOSIS — B20 Human immunodeficiency virus [HIV] disease: Secondary | ICD-10-CM | POA: Diagnosis not present

## 2015-09-21 DIAGNOSIS — R600 Localized edema: Secondary | ICD-10-CM | POA: Diagnosis not present

## 2015-09-21 DIAGNOSIS — R000 Tachycardia, unspecified: Secondary | ICD-10-CM | POA: Diagnosis not present

## 2015-09-21 DIAGNOSIS — R7989 Other specified abnormal findings of blood chemistry: Secondary | ICD-10-CM | POA: Diagnosis not present

## 2015-09-21 DIAGNOSIS — R918 Other nonspecific abnormal finding of lung field: Secondary | ICD-10-CM | POA: Diagnosis not present

## 2015-09-21 DIAGNOSIS — E872 Acidosis: Secondary | ICD-10-CM | POA: Diagnosis not present

## 2015-09-21 DIAGNOSIS — T82868A Thrombosis of vascular prosthetic devices, implants and grafts, initial encounter: Secondary | ICD-10-CM | POA: Diagnosis not present

## 2015-09-21 DIAGNOSIS — T82898A Other specified complication of vascular prosthetic devices, implants and grafts, initial encounter: Secondary | ICD-10-CM | POA: Diagnosis not present

## 2015-09-21 DIAGNOSIS — I428 Other cardiomyopathies: Secondary | ICD-10-CM | POA: Diagnosis not present

## 2015-09-21 DIAGNOSIS — R062 Wheezing: Secondary | ICD-10-CM | POA: Diagnosis not present

## 2015-09-21 DIAGNOSIS — R0789 Other chest pain: Secondary | ICD-10-CM | POA: Diagnosis not present

## 2015-09-21 DIAGNOSIS — I120 Hypertensive chronic kidney disease with stage 5 chronic kidney disease or end stage renal disease: Secondary | ICD-10-CM | POA: Diagnosis not present

## 2015-09-21 DIAGNOSIS — E875 Hyperkalemia: Secondary | ICD-10-CM | POA: Diagnosis not present

## 2015-09-21 DIAGNOSIS — Z992 Dependence on renal dialysis: Secondary | ICD-10-CM | POA: Diagnosis not present

## 2015-09-22 DIAGNOSIS — L409 Psoriasis, unspecified: Secondary | ICD-10-CM | POA: Diagnosis present

## 2015-09-22 DIAGNOSIS — E875 Hyperkalemia: Secondary | ICD-10-CM | POA: Diagnosis present

## 2015-09-22 DIAGNOSIS — T82868A Thrombosis of vascular prosthetic devices, implants and grafts, initial encounter: Secondary | ICD-10-CM | POA: Diagnosis present

## 2015-09-22 DIAGNOSIS — R0602 Shortness of breath: Secondary | ICD-10-CM | POA: Diagnosis not present

## 2015-09-22 DIAGNOSIS — I120 Hypertensive chronic kidney disease with stage 5 chronic kidney disease or end stage renal disease: Secondary | ICD-10-CM | POA: Diagnosis present

## 2015-09-22 DIAGNOSIS — Z136 Encounter for screening for cardiovascular disorders: Secondary | ICD-10-CM | POA: Diagnosis not present

## 2015-09-22 DIAGNOSIS — N19 Unspecified kidney failure: Secondary | ICD-10-CM | POA: Diagnosis not present

## 2015-09-22 DIAGNOSIS — E872 Acidosis: Secondary | ICD-10-CM | POA: Diagnosis present

## 2015-09-22 DIAGNOSIS — Z79899 Other long term (current) drug therapy: Secondary | ICD-10-CM | POA: Diagnosis not present

## 2015-09-22 DIAGNOSIS — Z992 Dependence on renal dialysis: Secondary | ICD-10-CM | POA: Diagnosis not present

## 2015-09-22 DIAGNOSIS — Z21 Asymptomatic human immunodeficiency virus [HIV] infection status: Secondary | ICD-10-CM | POA: Diagnosis not present

## 2015-09-22 DIAGNOSIS — Z01818 Encounter for other preprocedural examination: Secondary | ICD-10-CM | POA: Diagnosis not present

## 2015-09-22 DIAGNOSIS — I429 Cardiomyopathy, unspecified: Secondary | ICD-10-CM | POA: Diagnosis not present

## 2015-09-22 DIAGNOSIS — I5022 Chronic systolic (congestive) heart failure: Secondary | ICD-10-CM | POA: Diagnosis present

## 2015-09-22 DIAGNOSIS — T82898A Other specified complication of vascular prosthetic devices, implants and grafts, initial encounter: Secondary | ICD-10-CM | POA: Diagnosis not present

## 2015-09-22 DIAGNOSIS — Y832 Surgical operation with anastomosis, bypass or graft as the cause of abnormal reaction of the patient, or of later complication, without mention of misadventure at the time of the procedure: Secondary | ICD-10-CM | POA: Diagnosis not present

## 2015-09-22 DIAGNOSIS — I428 Other cardiomyopathies: Secondary | ICD-10-CM | POA: Diagnosis present

## 2015-09-22 DIAGNOSIS — F17200 Nicotine dependence, unspecified, uncomplicated: Secondary | ICD-10-CM | POA: Diagnosis present

## 2015-09-22 DIAGNOSIS — N186 End stage renal disease: Secondary | ICD-10-CM | POA: Diagnosis present

## 2015-09-22 DIAGNOSIS — B20 Human immunodeficiency virus [HIV] disease: Secondary | ICD-10-CM | POA: Diagnosis present

## 2015-09-22 DIAGNOSIS — R197 Diarrhea, unspecified: Secondary | ICD-10-CM | POA: Diagnosis present

## 2015-09-22 DIAGNOSIS — J45909 Unspecified asthma, uncomplicated: Secondary | ICD-10-CM | POA: Diagnosis present

## 2015-09-22 DIAGNOSIS — N16 Renal tubulo-interstitial disorders in diseases classified elsewhere: Secondary | ICD-10-CM | POA: Diagnosis present

## 2015-09-22 DIAGNOSIS — T82858A Stenosis of vascular prosthetic devices, implants and grafts, initial encounter: Secondary | ICD-10-CM | POA: Diagnosis not present

## 2015-09-22 DIAGNOSIS — I2510 Atherosclerotic heart disease of native coronary artery without angina pectoris: Secondary | ICD-10-CM | POA: Diagnosis present

## 2015-09-22 DIAGNOSIS — I252 Old myocardial infarction: Secondary | ICD-10-CM | POA: Diagnosis not present

## 2015-09-22 DIAGNOSIS — E8770 Fluid overload, unspecified: Secondary | ICD-10-CM | POA: Diagnosis present

## 2015-09-25 DIAGNOSIS — D696 Thrombocytopenia, unspecified: Secondary | ICD-10-CM | POA: Diagnosis not present

## 2015-09-25 DIAGNOSIS — I10 Essential (primary) hypertension: Secondary | ICD-10-CM | POA: Diagnosis not present

## 2015-09-25 DIAGNOSIS — N2581 Secondary hyperparathyroidism of renal origin: Secondary | ICD-10-CM | POA: Diagnosis not present

## 2015-09-25 DIAGNOSIS — D631 Anemia in chronic kidney disease: Secondary | ICD-10-CM | POA: Diagnosis not present

## 2015-09-25 DIAGNOSIS — N186 End stage renal disease: Secondary | ICD-10-CM | POA: Diagnosis not present

## 2015-09-27 DIAGNOSIS — I120 Hypertensive chronic kidney disease with stage 5 chronic kidney disease or end stage renal disease: Secondary | ICD-10-CM | POA: Diagnosis not present

## 2015-09-27 DIAGNOSIS — F17210 Nicotine dependence, cigarettes, uncomplicated: Secondary | ICD-10-CM | POA: Diagnosis not present

## 2015-09-27 DIAGNOSIS — N186 End stage renal disease: Secondary | ICD-10-CM | POA: Diagnosis not present

## 2015-09-27 DIAGNOSIS — B20 Human immunodeficiency virus [HIV] disease: Secondary | ICD-10-CM | POA: Diagnosis not present

## 2015-09-27 DIAGNOSIS — Z992 Dependence on renal dialysis: Secondary | ICD-10-CM | POA: Diagnosis not present

## 2015-09-27 DIAGNOSIS — T82898A Other specified complication of vascular prosthetic devices, implants and grafts, initial encounter: Secondary | ICD-10-CM | POA: Diagnosis not present

## 2015-09-28 DIAGNOSIS — N2581 Secondary hyperparathyroidism of renal origin: Secondary | ICD-10-CM | POA: Diagnosis not present

## 2015-09-28 DIAGNOSIS — D631 Anemia in chronic kidney disease: Secondary | ICD-10-CM | POA: Diagnosis not present

## 2015-09-28 DIAGNOSIS — D696 Thrombocytopenia, unspecified: Secondary | ICD-10-CM | POA: Diagnosis not present

## 2015-09-28 DIAGNOSIS — I10 Essential (primary) hypertension: Secondary | ICD-10-CM | POA: Diagnosis not present

## 2015-09-28 DIAGNOSIS — N186 End stage renal disease: Secondary | ICD-10-CM | POA: Diagnosis not present

## 2015-09-30 DIAGNOSIS — D631 Anemia in chronic kidney disease: Secondary | ICD-10-CM | POA: Diagnosis not present

## 2015-09-30 DIAGNOSIS — D696 Thrombocytopenia, unspecified: Secondary | ICD-10-CM | POA: Diagnosis not present

## 2015-09-30 DIAGNOSIS — I10 Essential (primary) hypertension: Secondary | ICD-10-CM | POA: Diagnosis not present

## 2015-09-30 DIAGNOSIS — N2581 Secondary hyperparathyroidism of renal origin: Secondary | ICD-10-CM | POA: Diagnosis not present

## 2015-09-30 DIAGNOSIS — N186 End stage renal disease: Secondary | ICD-10-CM | POA: Diagnosis not present

## 2015-10-01 DIAGNOSIS — T82898A Other specified complication of vascular prosthetic devices, implants and grafts, initial encounter: Secondary | ICD-10-CM | POA: Diagnosis not present

## 2015-10-01 DIAGNOSIS — F17210 Nicotine dependence, cigarettes, uncomplicated: Secondary | ICD-10-CM | POA: Diagnosis not present

## 2015-10-01 DIAGNOSIS — N2581 Secondary hyperparathyroidism of renal origin: Secondary | ICD-10-CM | POA: Diagnosis not present

## 2015-10-01 DIAGNOSIS — N186 End stage renal disease: Secondary | ICD-10-CM | POA: Diagnosis not present

## 2015-10-01 DIAGNOSIS — I252 Old myocardial infarction: Secondary | ICD-10-CM | POA: Diagnosis not present

## 2015-10-01 DIAGNOSIS — T82868A Thrombosis of vascular prosthetic devices, implants and grafts, initial encounter: Secondary | ICD-10-CM | POA: Diagnosis not present

## 2015-10-01 DIAGNOSIS — D649 Anemia, unspecified: Secondary | ICD-10-CM | POA: Diagnosis not present

## 2015-10-01 DIAGNOSIS — Z992 Dependence on renal dialysis: Secondary | ICD-10-CM | POA: Diagnosis not present

## 2015-10-01 DIAGNOSIS — T82898S Other specified complication of vascular prosthetic devices, implants and grafts, sequela: Secondary | ICD-10-CM | POA: Diagnosis not present

## 2015-10-01 DIAGNOSIS — B20 Human immunodeficiency virus [HIV] disease: Secondary | ICD-10-CM | POA: Diagnosis not present

## 2015-10-01 DIAGNOSIS — I120 Hypertensive chronic kidney disease with stage 5 chronic kidney disease or end stage renal disease: Secondary | ICD-10-CM | POA: Diagnosis not present

## 2015-10-01 DIAGNOSIS — Z79899 Other long term (current) drug therapy: Secondary | ICD-10-CM | POA: Diagnosis not present

## 2015-10-02 DIAGNOSIS — Z992 Dependence on renal dialysis: Secondary | ICD-10-CM | POA: Diagnosis not present

## 2015-10-02 DIAGNOSIS — B20 Human immunodeficiency virus [HIV] disease: Secondary | ICD-10-CM | POA: Diagnosis not present

## 2015-10-02 DIAGNOSIS — T82868A Thrombosis of vascular prosthetic devices, implants and grafts, initial encounter: Secondary | ICD-10-CM | POA: Diagnosis not present

## 2015-10-02 DIAGNOSIS — N186 End stage renal disease: Secondary | ICD-10-CM | POA: Diagnosis not present

## 2015-10-02 DIAGNOSIS — I10 Essential (primary) hypertension: Secondary | ICD-10-CM | POA: Diagnosis not present

## 2015-10-02 DIAGNOSIS — D631 Anemia in chronic kidney disease: Secondary | ICD-10-CM | POA: Diagnosis not present

## 2015-10-02 DIAGNOSIS — N2581 Secondary hyperparathyroidism of renal origin: Secondary | ICD-10-CM | POA: Diagnosis not present

## 2015-10-02 DIAGNOSIS — D649 Anemia, unspecified: Secondary | ICD-10-CM | POA: Diagnosis not present

## 2015-10-02 DIAGNOSIS — I120 Hypertensive chronic kidney disease with stage 5 chronic kidney disease or end stage renal disease: Secondary | ICD-10-CM | POA: Diagnosis not present

## 2015-10-07 DIAGNOSIS — D631 Anemia in chronic kidney disease: Secondary | ICD-10-CM | POA: Diagnosis not present

## 2015-10-07 DIAGNOSIS — N2581 Secondary hyperparathyroidism of renal origin: Secondary | ICD-10-CM | POA: Diagnosis not present

## 2015-10-07 DIAGNOSIS — D696 Thrombocytopenia, unspecified: Secondary | ICD-10-CM | POA: Diagnosis not present

## 2015-10-07 DIAGNOSIS — I10 Essential (primary) hypertension: Secondary | ICD-10-CM | POA: Diagnosis not present

## 2015-10-07 DIAGNOSIS — N186 End stage renal disease: Secondary | ICD-10-CM | POA: Diagnosis not present

## 2015-10-09 DIAGNOSIS — D696 Thrombocytopenia, unspecified: Secondary | ICD-10-CM | POA: Diagnosis not present

## 2015-10-09 DIAGNOSIS — I10 Essential (primary) hypertension: Secondary | ICD-10-CM | POA: Diagnosis not present

## 2015-10-09 DIAGNOSIS — N186 End stage renal disease: Secondary | ICD-10-CM | POA: Diagnosis not present

## 2015-10-09 DIAGNOSIS — D631 Anemia in chronic kidney disease: Secondary | ICD-10-CM | POA: Diagnosis not present

## 2015-10-09 DIAGNOSIS — N2581 Secondary hyperparathyroidism of renal origin: Secondary | ICD-10-CM | POA: Diagnosis not present

## 2015-10-11 DIAGNOSIS — N186 End stage renal disease: Secondary | ICD-10-CM | POA: Diagnosis not present

## 2015-10-11 DIAGNOSIS — D631 Anemia in chronic kidney disease: Secondary | ICD-10-CM | POA: Diagnosis not present

## 2015-10-11 DIAGNOSIS — D696 Thrombocytopenia, unspecified: Secondary | ICD-10-CM | POA: Diagnosis not present

## 2015-10-11 DIAGNOSIS — N2581 Secondary hyperparathyroidism of renal origin: Secondary | ICD-10-CM | POA: Diagnosis not present

## 2015-10-11 DIAGNOSIS — I10 Essential (primary) hypertension: Secondary | ICD-10-CM | POA: Diagnosis not present

## 2015-10-14 DIAGNOSIS — D631 Anemia in chronic kidney disease: Secondary | ICD-10-CM | POA: Diagnosis not present

## 2015-10-14 DIAGNOSIS — N2581 Secondary hyperparathyroidism of renal origin: Secondary | ICD-10-CM | POA: Diagnosis not present

## 2015-10-14 DIAGNOSIS — N186 End stage renal disease: Secondary | ICD-10-CM | POA: Diagnosis not present

## 2015-10-14 DIAGNOSIS — I10 Essential (primary) hypertension: Secondary | ICD-10-CM | POA: Diagnosis not present

## 2015-10-14 DIAGNOSIS — D696 Thrombocytopenia, unspecified: Secondary | ICD-10-CM | POA: Diagnosis not present

## 2015-10-16 DIAGNOSIS — Z992 Dependence on renal dialysis: Secondary | ICD-10-CM | POA: Diagnosis not present

## 2015-10-16 DIAGNOSIS — D631 Anemia in chronic kidney disease: Secondary | ICD-10-CM | POA: Diagnosis not present

## 2015-10-16 DIAGNOSIS — D696 Thrombocytopenia, unspecified: Secondary | ICD-10-CM | POA: Diagnosis not present

## 2015-10-16 DIAGNOSIS — I10 Essential (primary) hypertension: Secondary | ICD-10-CM | POA: Diagnosis not present

## 2015-10-16 DIAGNOSIS — N2581 Secondary hyperparathyroidism of renal origin: Secondary | ICD-10-CM | POA: Diagnosis not present

## 2015-10-16 DIAGNOSIS — N186 End stage renal disease: Secondary | ICD-10-CM | POA: Diagnosis not present

## 2015-10-19 DIAGNOSIS — N2581 Secondary hyperparathyroidism of renal origin: Secondary | ICD-10-CM | POA: Diagnosis not present

## 2015-10-19 DIAGNOSIS — D631 Anemia in chronic kidney disease: Secondary | ICD-10-CM | POA: Diagnosis not present

## 2015-10-19 DIAGNOSIS — N186 End stage renal disease: Secondary | ICD-10-CM | POA: Diagnosis not present

## 2015-10-19 DIAGNOSIS — D689 Coagulation defect, unspecified: Secondary | ICD-10-CM | POA: Diagnosis not present

## 2015-10-19 DIAGNOSIS — D696 Thrombocytopenia, unspecified: Secondary | ICD-10-CM | POA: Diagnosis not present

## 2015-10-19 DIAGNOSIS — D509 Iron deficiency anemia, unspecified: Secondary | ICD-10-CM | POA: Diagnosis not present

## 2015-10-19 DIAGNOSIS — I10 Essential (primary) hypertension: Secondary | ICD-10-CM | POA: Diagnosis not present

## 2015-10-21 DIAGNOSIS — D631 Anemia in chronic kidney disease: Secondary | ICD-10-CM | POA: Diagnosis not present

## 2015-10-21 DIAGNOSIS — D689 Coagulation defect, unspecified: Secondary | ICD-10-CM | POA: Diagnosis not present

## 2015-10-21 DIAGNOSIS — N186 End stage renal disease: Secondary | ICD-10-CM | POA: Diagnosis not present

## 2015-10-21 DIAGNOSIS — N2581 Secondary hyperparathyroidism of renal origin: Secondary | ICD-10-CM | POA: Diagnosis not present

## 2015-10-21 DIAGNOSIS — D509 Iron deficiency anemia, unspecified: Secondary | ICD-10-CM | POA: Diagnosis not present

## 2015-10-23 DIAGNOSIS — N186 End stage renal disease: Secondary | ICD-10-CM | POA: Diagnosis not present

## 2015-10-23 DIAGNOSIS — D689 Coagulation defect, unspecified: Secondary | ICD-10-CM | POA: Diagnosis not present

## 2015-10-23 DIAGNOSIS — D509 Iron deficiency anemia, unspecified: Secondary | ICD-10-CM | POA: Diagnosis not present

## 2015-10-23 DIAGNOSIS — N2581 Secondary hyperparathyroidism of renal origin: Secondary | ICD-10-CM | POA: Diagnosis not present

## 2015-10-23 DIAGNOSIS — D631 Anemia in chronic kidney disease: Secondary | ICD-10-CM | POA: Diagnosis not present

## 2015-10-28 DIAGNOSIS — D509 Iron deficiency anemia, unspecified: Secondary | ICD-10-CM | POA: Diagnosis not present

## 2015-10-28 DIAGNOSIS — N186 End stage renal disease: Secondary | ICD-10-CM | POA: Diagnosis not present

## 2015-10-28 DIAGNOSIS — D631 Anemia in chronic kidney disease: Secondary | ICD-10-CM | POA: Diagnosis not present

## 2015-10-28 DIAGNOSIS — I10 Essential (primary) hypertension: Secondary | ICD-10-CM | POA: Diagnosis not present

## 2015-10-28 DIAGNOSIS — D689 Coagulation defect, unspecified: Secondary | ICD-10-CM | POA: Diagnosis not present

## 2015-10-28 DIAGNOSIS — N2581 Secondary hyperparathyroidism of renal origin: Secondary | ICD-10-CM | POA: Diagnosis not present

## 2015-10-30 DIAGNOSIS — D689 Coagulation defect, unspecified: Secondary | ICD-10-CM | POA: Diagnosis not present

## 2015-10-30 DIAGNOSIS — N2581 Secondary hyperparathyroidism of renal origin: Secondary | ICD-10-CM | POA: Diagnosis not present

## 2015-10-30 DIAGNOSIS — D509 Iron deficiency anemia, unspecified: Secondary | ICD-10-CM | POA: Diagnosis not present

## 2015-10-30 DIAGNOSIS — D631 Anemia in chronic kidney disease: Secondary | ICD-10-CM | POA: Diagnosis not present

## 2015-10-30 DIAGNOSIS — N186 End stage renal disease: Secondary | ICD-10-CM | POA: Diagnosis not present

## 2015-11-02 DIAGNOSIS — D689 Coagulation defect, unspecified: Secondary | ICD-10-CM | POA: Diagnosis not present

## 2015-11-02 DIAGNOSIS — D509 Iron deficiency anemia, unspecified: Secondary | ICD-10-CM | POA: Diagnosis not present

## 2015-11-02 DIAGNOSIS — N186 End stage renal disease: Secondary | ICD-10-CM | POA: Diagnosis not present

## 2015-11-02 DIAGNOSIS — D631 Anemia in chronic kidney disease: Secondary | ICD-10-CM | POA: Diagnosis not present

## 2015-11-02 DIAGNOSIS — N2581 Secondary hyperparathyroidism of renal origin: Secondary | ICD-10-CM | POA: Diagnosis not present

## 2015-11-04 DIAGNOSIS — N2581 Secondary hyperparathyroidism of renal origin: Secondary | ICD-10-CM | POA: Diagnosis not present

## 2015-11-04 DIAGNOSIS — D509 Iron deficiency anemia, unspecified: Secondary | ICD-10-CM | POA: Diagnosis not present

## 2015-11-04 DIAGNOSIS — N186 End stage renal disease: Secondary | ICD-10-CM | POA: Diagnosis not present

## 2015-11-04 DIAGNOSIS — D689 Coagulation defect, unspecified: Secondary | ICD-10-CM | POA: Diagnosis not present

## 2015-11-04 DIAGNOSIS — D631 Anemia in chronic kidney disease: Secondary | ICD-10-CM | POA: Diagnosis not present

## 2015-11-06 DIAGNOSIS — D509 Iron deficiency anemia, unspecified: Secondary | ICD-10-CM | POA: Diagnosis not present

## 2015-11-06 DIAGNOSIS — D631 Anemia in chronic kidney disease: Secondary | ICD-10-CM | POA: Diagnosis not present

## 2015-11-06 DIAGNOSIS — N186 End stage renal disease: Secondary | ICD-10-CM | POA: Diagnosis not present

## 2015-11-06 DIAGNOSIS — N2581 Secondary hyperparathyroidism of renal origin: Secondary | ICD-10-CM | POA: Diagnosis not present

## 2015-11-06 DIAGNOSIS — D689 Coagulation defect, unspecified: Secondary | ICD-10-CM | POA: Diagnosis not present

## 2015-11-09 DIAGNOSIS — D509 Iron deficiency anemia, unspecified: Secondary | ICD-10-CM | POA: Diagnosis not present

## 2015-11-09 DIAGNOSIS — N186 End stage renal disease: Secondary | ICD-10-CM | POA: Diagnosis not present

## 2015-11-09 DIAGNOSIS — D631 Anemia in chronic kidney disease: Secondary | ICD-10-CM | POA: Diagnosis not present

## 2015-11-09 DIAGNOSIS — N2581 Secondary hyperparathyroidism of renal origin: Secondary | ICD-10-CM | POA: Diagnosis not present

## 2015-11-09 DIAGNOSIS — D689 Coagulation defect, unspecified: Secondary | ICD-10-CM | POA: Diagnosis not present

## 2015-11-13 DIAGNOSIS — N2581 Secondary hyperparathyroidism of renal origin: Secondary | ICD-10-CM | POA: Diagnosis not present

## 2015-11-13 DIAGNOSIS — D509 Iron deficiency anemia, unspecified: Secondary | ICD-10-CM | POA: Diagnosis not present

## 2015-11-13 DIAGNOSIS — D631 Anemia in chronic kidney disease: Secondary | ICD-10-CM | POA: Diagnosis not present

## 2015-11-13 DIAGNOSIS — N186 End stage renal disease: Secondary | ICD-10-CM | POA: Diagnosis not present

## 2015-11-13 DIAGNOSIS — D689 Coagulation defect, unspecified: Secondary | ICD-10-CM | POA: Diagnosis not present

## 2015-11-16 DIAGNOSIS — N2581 Secondary hyperparathyroidism of renal origin: Secondary | ICD-10-CM | POA: Diagnosis not present

## 2015-11-16 DIAGNOSIS — Z992 Dependence on renal dialysis: Secondary | ICD-10-CM | POA: Diagnosis not present

## 2015-11-16 DIAGNOSIS — D689 Coagulation defect, unspecified: Secondary | ICD-10-CM | POA: Diagnosis not present

## 2015-11-16 DIAGNOSIS — D509 Iron deficiency anemia, unspecified: Secondary | ICD-10-CM | POA: Diagnosis not present

## 2015-11-16 DIAGNOSIS — N186 End stage renal disease: Secondary | ICD-10-CM | POA: Diagnosis not present

## 2015-11-16 DIAGNOSIS — D631 Anemia in chronic kidney disease: Secondary | ICD-10-CM | POA: Diagnosis not present

## 2015-11-19 ENCOUNTER — Ambulatory Visit (INDEPENDENT_AMBULATORY_CARE_PROVIDER_SITE_OTHER): Payer: Medicare Other | Admitting: Cardiovascular Disease

## 2015-11-19 VITALS — BP 115/91 | HR 99 | Ht 65.0 in | Wt 125.0 lb

## 2015-11-19 DIAGNOSIS — Z992 Dependence on renal dialysis: Secondary | ICD-10-CM

## 2015-11-19 DIAGNOSIS — I5042 Chronic combined systolic (congestive) and diastolic (congestive) heart failure: Secondary | ICD-10-CM | POA: Diagnosis not present

## 2015-11-19 DIAGNOSIS — Z9289 Personal history of other medical treatment: Secondary | ICD-10-CM

## 2015-11-19 DIAGNOSIS — I429 Cardiomyopathy, unspecified: Secondary | ICD-10-CM | POA: Diagnosis not present

## 2015-11-19 DIAGNOSIS — B20 Human immunodeficiency virus [HIV] disease: Secondary | ICD-10-CM

## 2015-11-19 DIAGNOSIS — N186 End stage renal disease: Secondary | ICD-10-CM

## 2015-11-19 DIAGNOSIS — Z87898 Personal history of other specified conditions: Secondary | ICD-10-CM

## 2015-11-19 MED ORDER — LISINOPRIL 2.5 MG PO TABS: 2.5000 mg | ORAL_TABLET | Freq: Every day | ORAL | Status: DC

## 2015-11-19 MED ORDER — METOPROLOL SUCCINATE ER 50 MG PO TB24: 50.0000 mg | ORAL_TABLET | Freq: Two times a day (BID) | ORAL | Status: DC

## 2015-11-19 NOTE — Progress Notes (Signed)
Patient ID: Dave Estrada, male   DOB: May 31, 1991, 25 y.o.   MRN: [id number]      SUBJECTIVE: The patient presents for routine follow-up. 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 Also has ESRD on hemodialysis and had another catheter placed on 09/15/14 and AV graft placed in 10/2014.  I consulted on him in March 2016 when he was admitted with Staph Aureus bacteremia (MRSA). ID had evaluated pt and initially recommended a TEE be repeated to exclude valvular vegetations. However, since 6 weeks of IV antimicrobial therapy was planned and the catheter was removed, it was decided not to proceed with a TEE as it would not significantly change management.  He underwent a transthoracic echocardiogram on 12/19/14 which demonstrated severely reduced left ventricular systolic function with diffuse hypokinesis, EF 20%. There was grade 2 diastolic dysfunction with increased filling pressures, mild mitral regurgitation, mildly reduced right ventricular systolic function, and a small mobile mass at the tip of a catheter protruding into the right atrium.  He was started on low-dose carvedilol and lisinopril prior to discharge.  He was hospitalized in November 2016 for acute hypoxic respiratory failure secondary to pneumonia.   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  Denies fevers, chest pain, leg swelling. Has had episodes of paroxysmal nocturnal dyspnea. Has NYHA class III symptoms.  Has cut back to smoking 3-4 cigarettes daily.   Review of Systems: As per "subjective", otherwise negative.  Allergies  Allergen Reactions  . Bee Venom Anaphylaxis  . Other Anaphylaxis    mushrooms  . Aspirin Other (See Comments)    Reaction is unknown    Current Outpatient Prescriptions  Medication Sig Dispense Refill  . albuterol (PROVENTIL HFA;VENTOLIN HFA) 108 (90 BASE) MCG/ACT inhaler Inhale 1-2 puffs into the lungs every 6 (six) hours as needed for wheezing or shortness of breath.    Marland Kitchen emtricitabine (EMTRIVA) 200 MG capsule Take 200 mg by mouth 2 (two) times a week. Taken after dialysis on Tuesday and Thursday    . etravirine (INTELENCE) 100 MG tablet Take 200 mg by mouth every 12 (twelve) hours.    Marland Kitchen ibuprofen (ADVIL,MOTRIN) 200 MG tablet Take 200 mg by mouth every 6 (six) hours as needed for fever or moderate pain.    Marland Kitchen levofloxacin (LEVAQUIN) 500 MG tablet Take 1 tablet (500 mg total) by mouth every other day. Start 11/29 in evening after dialysis. 4 tablet 0  . lisinopril (PRINIVIL,ZESTRIL) 2.5 MG tablet Take 0.5 tablets (1.25 mg total) by mouth daily. New medication for your heart. 30 tablet 2  . metoprolol succinate (TOPROL XL) 25 MG 24 hr tablet Take 1 tablet (25 mg total) by mouth 2 (two) times daily. 60 tablet 11  . raltegravir (ISENTRESS) 400 MG tablet Take 400 mg by mouth 2 (two) times daily.    Marland Kitchen sulfamethoxazole-trimethoprim (BACTRIM DS,SEPTRA DS) 800-160 MG tablet Take 2 tablets by mouth daily. Last dose 12/16 36 tablet 0  . tenofovir (VIREAD)  300 MG tablet Take 300 mg by mouth once a week. To be taken on Tuesday after dialysis     No current facility-administered medications for this visit.    Past Medical History  Diagnosis Date  . HIV (human immunodeficiency virus infection) (HCC)   . ESRD (end  stage renal disease) (HCC)   . Staphylococcus aureus bacteremia with sepsis (HCC) 09/09/2014  . A-V fistula (HCC)     Placed on 10/27/2014 at [facility]; for future HD use.   . Systolic dysfunction, left ventricle 12/20/2014    EF 20%   . Asthma     Past Surgical History  Procedure Laterality Date  . Port a cath revision    . Tee without cardioversion N/A 09/14/2014    Procedure: TRANSESOPHAGEAL ECHOCARDIOGRAM (TEE);  Surgeon: Laurey Morale, MD;  Location: [facility] ENDOSCOPY;  Service: Cardiovascular;  Laterality: N/A;  will be at [facility] by mon  . Insertion of dialysis catheter Left 09/15/2014    Procedure: INSERTION OF DIALYSIS CATHETER;  Surgeon: Nada Libman, MD;  Location: [facility] OR;  Service: Vascular;  Laterality: Left;    Social History   Social History  . Marital Status: Single    Spouse Name: N/A  . Number of Children: N/A  . Years of Education: N/A   Occupational History  . Not on file.   Social History Main Topics  . Smoking status: Current Every Day Smoker -- 1.00 packs/day for 11 years    Types: Cigarettes    Start date: 01/28/2003  . Smokeless tobacco: Never Used  . Alcohol Use: No  . Drug Use: Yes    Special: Marijuana  . Sexual Activity: Not Currently   Other Topics Concern  . Not on file   Social History Narrative     Filed Vitals:   11/19/15 1059  BP: 115/91  Pulse: 99  Height:  (1.651 m)  Weight: 125 lb (56.7 kg)  SpO2: 93%    PHYSICAL EXAM General: NAD HEENT: Normal. Neck: No JVD, no thyromegaly. Lungs: Faint rhonchi b/l, no rales, diminished respiratory effort. CV: HR at upper normal limits, regular rhythm, normal S1/S2, no S3/S4, no murmur. Trace pretibial and periankle edema.   Abdomen: Soft, nontender, no distention.  Neurologic: Alert and oriented.  Psych: Normal affect. Skin: Lower extremity stasis dermatitis. Musculoskeletal: No gross deformities.  ECG: Most recent ECG reviewed.      ASSESSMENT AND PLAN: 1. Cardiomyopathy with  chronic systolic heart failure, EF 10-15%: Probably nonischemic and related to HIV but non-compaction cannot entirely be ruled out. Will obtain cardiac MRI to further evaluate. HR elevated-will increase Toprol-XL to 50 mg bid. Will increase lisinopril to 2.5 mg daily. Will make referral to advanced HF clinic. Volume managed with hemodialysis. Given propensity for infection, likely a poor candidate for ICD.  2. Chronic HIV disease: Followed by [facility], on four drug therapy.  3. ESRD, on hemodialysis: Followed by Dr. Kristian Covey.   Dispo: f/u with advanced HF clinic. F/u with me in 9 months.   Prentice Docker, M.D., F.A.C.C.

## 2015-11-19 NOTE — Patient Instructions (Signed)
Your physician recommends that you schedule a follow-up appointment in:  9 months with Dr.Koneswaran   Please schedule a cardiac MRI at [facility]  You have been referred to Heart failure clinic at [facility].They will call you to schedule appointment    INCREASE lisinopril to 2.5 mg daily  INCREASE Toprol XL to 50 mg twice a day        Thank you for choosing [facility] !

## 2015-11-20 DIAGNOSIS — N2581 Secondary hyperparathyroidism of renal origin: Secondary | ICD-10-CM | POA: Diagnosis not present

## 2015-11-20 DIAGNOSIS — D631 Anemia in chronic kidney disease: Secondary | ICD-10-CM | POA: Diagnosis not present

## 2015-11-20 DIAGNOSIS — A4902 Methicillin resistant Staphylococcus aureus infection, unspecified site: Secondary | ICD-10-CM | POA: Diagnosis not present

## 2015-11-20 DIAGNOSIS — N186 End stage renal disease: Secondary | ICD-10-CM | POA: Diagnosis not present

## 2015-11-20 DIAGNOSIS — I10 Essential (primary) hypertension: Secondary | ICD-10-CM | POA: Diagnosis not present

## 2015-11-20 DIAGNOSIS — L299 Pruritus, unspecified: Secondary | ICD-10-CM | POA: Diagnosis not present

## 2015-11-20 DIAGNOSIS — D696 Thrombocytopenia, unspecified: Secondary | ICD-10-CM | POA: Diagnosis not present

## 2015-11-20 DIAGNOSIS — D509 Iron deficiency anemia, unspecified: Secondary | ICD-10-CM | POA: Diagnosis not present

## 2015-11-22 DIAGNOSIS — Z992 Dependence on renal dialysis: Secondary | ICD-10-CM | POA: Diagnosis not present

## 2015-11-22 DIAGNOSIS — N186 End stage renal disease: Secondary | ICD-10-CM | POA: Diagnosis not present

## 2015-11-23 ENCOUNTER — Emergency Department (HOSPITAL_COMMUNITY): Payer: Medicare Other

## 2015-11-23 ENCOUNTER — Inpatient Hospital Stay (HOSPITAL_COMMUNITY)
Admission: EM | Admit: 2015-11-23 | Discharge: 2015-12-03 | DRG: 314 | Disposition: A | Discharge status: Home / Self Care | Payer: Medicare Other | Attending: Family Medicine | Admitting: Family Medicine

## 2015-11-23 ENCOUNTER — Ambulatory Visit (HOSPITAL_COMMUNITY): Admission: RE | Admit: 2015-11-23 | Payer: Medicare Other | Source: Ambulatory Visit

## 2015-11-23 ENCOUNTER — Encounter (HOSPITAL_COMMUNITY): Payer: Self-pay

## 2015-11-23 DIAGNOSIS — E162 Hypoglycemia, unspecified: Secondary | ICD-10-CM | POA: Diagnosis not present

## 2015-11-23 DIAGNOSIS — R945 Abnormal results of liver function studies: Secondary | ICD-10-CM

## 2015-11-23 DIAGNOSIS — R509 Fever, unspecified: Secondary | ICD-10-CM | POA: Diagnosis not present

## 2015-11-23 DIAGNOSIS — B9562 Methicillin resistant Staphylococcus aureus infection as the cause of diseases classified elsewhere: Secondary | ICD-10-CM | POA: Diagnosis present

## 2015-11-23 DIAGNOSIS — A4101 Sepsis due to Methicillin susceptible Staphylococcus aureus: Secondary | ICD-10-CM | POA: Diagnosis present

## 2015-11-23 DIAGNOSIS — E875 Hyperkalemia: Secondary | ICD-10-CM | POA: Diagnosis present

## 2015-11-23 DIAGNOSIS — N186 End stage renal disease: Secondary | ICD-10-CM | POA: Diagnosis not present

## 2015-11-23 DIAGNOSIS — Y838 Other surgical procedures as the cause of abnormal reaction of the patient, or of later complication, without mention of misadventure at the time of the procedure: Secondary | ICD-10-CM | POA: Diagnosis present

## 2015-11-23 DIAGNOSIS — Z21 Asymptomatic human immunodeficiency virus [HIV] infection status: Secondary | ICD-10-CM | POA: Diagnosis present

## 2015-11-23 DIAGNOSIS — Z886 Allergy status to analgesic agent status: Secondary | ICD-10-CM

## 2015-11-23 DIAGNOSIS — T827XXA Infection and inflammatory reaction due to other cardiac and vascular devices, implants and grafts, initial encounter: Principal | ICD-10-CM | POA: Diagnosis present

## 2015-11-23 DIAGNOSIS — Z992 Dependence on renal dialysis: Secondary | ICD-10-CM | POA: Diagnosis not present

## 2015-11-23 DIAGNOSIS — R109 Unspecified abdominal pain: Secondary | ICD-10-CM

## 2015-11-23 DIAGNOSIS — R05 Cough: Secondary | ICD-10-CM | POA: Diagnosis not present

## 2015-11-23 DIAGNOSIS — R7881 Bacteremia: Secondary | ICD-10-CM | POA: Diagnosis present

## 2015-11-23 DIAGNOSIS — I120 Hypertensive chronic kidney disease with stage 5 chronic kidney disease or end stage renal disease: Secondary | ICD-10-CM | POA: Diagnosis present

## 2015-11-23 DIAGNOSIS — B20 Human immunodeficiency virus [HIV] disease: Secondary | ICD-10-CM | POA: Diagnosis present

## 2015-11-23 DIAGNOSIS — J45909 Unspecified asthma, uncomplicated: Secondary | ICD-10-CM | POA: Diagnosis present

## 2015-11-23 DIAGNOSIS — G8929 Other chronic pain: Secondary | ICD-10-CM | POA: Diagnosis present

## 2015-11-23 DIAGNOSIS — I428 Other cardiomyopathies: Secondary | ICD-10-CM

## 2015-11-23 DIAGNOSIS — R112 Nausea with vomiting, unspecified: Secondary | ICD-10-CM

## 2015-11-23 DIAGNOSIS — F17210 Nicotine dependence, cigarettes, uncomplicated: Secondary | ICD-10-CM | POA: Diagnosis present

## 2015-11-23 DIAGNOSIS — K5900 Constipation, unspecified: Secondary | ICD-10-CM | POA: Diagnosis present

## 2015-11-23 DIAGNOSIS — D696 Thrombocytopenia, unspecified: Secondary | ICD-10-CM | POA: Diagnosis not present

## 2015-11-23 DIAGNOSIS — M545 Low back pain: Secondary | ICD-10-CM | POA: Diagnosis present

## 2015-11-23 DIAGNOSIS — R000 Tachycardia, unspecified: Secondary | ICD-10-CM | POA: Diagnosis not present

## 2015-11-23 DIAGNOSIS — D631 Anemia in chronic kidney disease: Secondary | ICD-10-CM | POA: Diagnosis present

## 2015-11-23 DIAGNOSIS — R7989 Other specified abnormal findings of blood chemistry: Secondary | ICD-10-CM

## 2015-11-23 DIAGNOSIS — A419 Sepsis, unspecified organism: Secondary | ICD-10-CM

## 2015-11-23 DIAGNOSIS — E871 Hypo-osmolality and hyponatremia: Secondary | ICD-10-CM | POA: Diagnosis not present

## 2015-11-23 DIAGNOSIS — A4102 Sepsis due to Methicillin resistant Staphylococcus aureus: Secondary | ICD-10-CM | POA: Diagnosis present

## 2015-11-23 DIAGNOSIS — I252 Old myocardial infarction: Secondary | ICD-10-CM

## 2015-11-23 DIAGNOSIS — I429 Cardiomyopathy, unspecified: Secondary | ICD-10-CM | POA: Diagnosis present

## 2015-11-23 LAB — CBC WITH DIFFERENTIAL/PLATELET
BASOS PCT: 0 %
Basophils Absolute: 0 10*3/uL (ref 0.0–0.1)
EOS ABS: 0 10*3/uL (ref 0.0–0.7)
EOS PCT: 0 %
HCT: 37.7 % — ABNORMAL LOW (ref 39.0–52.0)
Hemoglobin: 12.5 g/dL — ABNORMAL LOW (ref 13.0–17.0)
Lymphocytes Relative: 5 %
Lymphs Abs: 0.6 10*3/uL — ABNORMAL LOW (ref 0.7–4.0)
MCH: 37.2 pg — AB (ref 26.0–34.0)
MCHC: 33.2 g/dL (ref 30.0–36.0)
MCV: 112.2 fL — AB (ref 78.0–100.0)
MONOS PCT: 4 %
Monocytes Absolute: 0.5 10*3/uL (ref 0.1–1.0)
NEUTROS PCT: 91 %
Neutro Abs: 11.7 10*3/uL — ABNORMAL HIGH (ref 1.7–7.7)
PLATELETS: 181 10*3/uL (ref 150–400)
RBC: 3.36 MIL/uL — ABNORMAL LOW (ref 4.22–5.81)
RDW: 15.1 % (ref 11.5–15.5)
WBC: 12.9 10*3/uL — AB (ref 4.0–10.5)

## 2015-11-23 LAB — COMPREHENSIVE METABOLIC PANEL
ALK PHOS: 68 U/L (ref 38–126)
ALT: 20 U/L (ref 17–63)
AST: 37 U/L (ref 15–41)
Albumin: 3.2 g/dL — ABNORMAL LOW (ref 3.5–5.0)
Anion gap: 16 — ABNORMAL HIGH (ref 5–15)
BUN: 60 mg/dL — AB (ref 6–20)
CALCIUM: 8.8 mg/dL — AB (ref 8.9–10.3)
CO2: 20 mmol/L — AB (ref 22–32)
CREATININE: 10.61 mg/dL — AB (ref 0.61–1.24)
Chloride: 100 mmol/L — ABNORMAL LOW (ref 101–111)
GFR, EST AFRICAN AMERICAN: 7 mL/min — AB (ref >60)
GFR, EST NON AFRICAN AMERICAN: 6 mL/min — AB (ref >60)
Glucose, Bld: 107 mg/dL — ABNORMAL HIGH (ref 65–99)
Potassium: 4.5 mmol/L (ref 3.5–5.1)
Sodium: 136 mmol/L (ref 135–145)
Total Bilirubin: 1.6 mg/dL — ABNORMAL HIGH (ref 0.3–1.2)
Total Protein: 6.9 g/dL (ref 6.5–8.1)

## 2015-11-23 LAB — MRSA PCR SCREENING: MRSA BY PCR: POSITIVE — AB

## 2015-11-23 LAB — I-STAT CG4 LACTIC ACID, ED: Lactic Acid, Venous: 2.55 mmol/L (ref 0.5–2.0)

## 2015-11-23 MED ORDER — SULFAMETHOXAZOLE-TRIMETHOPRIM 800-160 MG PO TABS
0.5000 | ORAL_TABLET | ORAL | Status: DC
  Administered 2015-11-24 – 2015-12-03 (×5): 0.5 via ORAL
  Filled 2015-11-23 (×6): qty 1

## 2015-11-23 MED ORDER — FAMOTIDINE 20 MG PO TABS
10.0000 mg | ORAL_TABLET | Freq: Two times a day (BID) | ORAL | Status: DC | PRN
  Administered 2015-11-26 – 2015-11-27 (×2): 10 mg via ORAL
  Filled 2015-11-23 (×4): qty 1

## 2015-11-23 MED ORDER — ACETAMINOPHEN 325 MG PO TABS: 650.0000 mg | ORAL_TABLET | Freq: Four times a day (QID) | ORAL | Status: DC | PRN

## 2015-11-23 MED ORDER — LISINOPRIL 5 MG PO TABS
2.5000 mg | ORAL_TABLET | Freq: Every day | ORAL | Status: DC
  Administered 2015-11-24: 2.5 mg via ORAL
  Filled 2015-11-23 (×2): qty 1

## 2015-11-23 MED ORDER — GUAIFENESIN ER 600 MG PO TB12
1200.0000 mg | ORAL_TABLET | Freq: Two times a day (BID) | ORAL | Status: DC
  Administered 2015-11-23 – 2015-12-01 (×15): 1200 mg via ORAL
  Filled 2015-11-23 (×17): qty 2

## 2015-11-23 MED ORDER — IPRATROPIUM BROMIDE 0.02 % IN SOLN: 0.5000 mg | Freq: Four times a day (QID) | RESPIRATORY_TRACT | Status: DC

## 2015-11-23 MED ORDER — ALBUTEROL SULFATE (2.5 MG/3ML) 0.083% IN NEBU: 2.5000 mg | INHALATION_SOLUTION | Freq: Four times a day (QID) | RESPIRATORY_TRACT | Status: DC

## 2015-11-23 MED ORDER — VANCOMYCIN HCL 10 G IV SOLR
1500.0000 mg | Freq: Once | INTRAVENOUS | Status: AC
  Administered 2015-11-23: 1500 mg via INTRAVENOUS
  Filled 2015-11-23: qty 1500

## 2015-11-23 MED ORDER — ACETAMINOPHEN 650 MG RE SUPP: 650.0000 mg | Freq: Four times a day (QID) | RECTAL | Status: DC | PRN

## 2015-11-23 MED ORDER — ALUM & MAG HYDROXIDE-SIMETH 200-200-20 MG/5ML PO SUSP: 15.0000 mL | ORAL | Status: DC | PRN

## 2015-11-23 MED ORDER — ONDANSETRON HCL 4 MG/2ML IJ SOLN
4.0000 mg | Freq: Four times a day (QID) | INTRAMUSCULAR | Status: DC | PRN
  Administered 2015-11-24 – 2015-11-28 (×2): 4 mg via INTRAVENOUS
  Filled 2015-11-23 (×3): qty 2

## 2015-11-23 MED ORDER — ETRAVIRINE 100 MG PO TABS
200.0000 mg | ORAL_TABLET | Freq: Two times a day (BID) | ORAL | Status: DC
  Administered 2015-11-23 – 2015-12-02 (×17): 200 mg via ORAL
  Filled 2015-11-23 (×26): qty 2

## 2015-11-23 MED ORDER — ACETAMINOPHEN 325 MG PO TABS
650.0000 mg | ORAL_TABLET | Freq: Once | ORAL | Status: AC
  Administered 2015-11-23: 650 mg via ORAL
  Filled 2015-11-23: qty 2

## 2015-11-23 MED ORDER — TENOFOVIR DISOPROXIL FUMARATE 300 MG PO TABS
300.0000 mg | ORAL_TABLET | ORAL | Status: DC
  Administered 2015-11-30: 300 mg via ORAL
  Filled 2015-11-23 (×2): qty 1

## 2015-11-23 MED ORDER — SODIUM CHLORIDE 0.9 % IV SOLN
INTRAVENOUS | Status: DC
Start: 2015-11-23 — End: 2015-12-03
  Administered 2015-11-23: 19:00:00 via INTRAVENOUS
  Administered 2015-11-24: 10 mL/h via INTRAVENOUS

## 2015-11-23 MED ORDER — IPRATROPIUM-ALBUTEROL 0.5-2.5 (3) MG/3ML IN SOLN
3.0000 mL | Freq: Four times a day (QID) | RESPIRATORY_TRACT | Status: DC
  Administered 2015-11-23 – 2015-11-24 (×4): 3 mL via RESPIRATORY_TRACT
  Filled 2015-11-23 (×5): qty 3

## 2015-11-23 MED ORDER — RALTEGRAVIR POTASSIUM 400 MG PO TABS
400.0000 mg | ORAL_TABLET | Freq: Two times a day (BID) | ORAL | Status: DC
  Administered 2015-11-23 – 2015-12-02 (×17): 400 mg via ORAL
  Filled 2015-11-23 (×26): qty 1

## 2015-11-23 MED ORDER — EMTRICITABINE 200 MG PO CAPS
200.0000 mg | ORAL_CAPSULE | ORAL | Status: DC
  Administered 2015-11-25 – 2015-12-02 (×3): 200 mg via ORAL
  Filled 2015-11-23 (×4): qty 1

## 2015-11-23 MED ORDER — HYDROCODONE-ACETAMINOPHEN 5-325 MG PO TABS
1.0000 | ORAL_TABLET | Freq: Four times a day (QID) | ORAL | Status: DC | PRN
  Administered 2015-11-23 – 2015-12-03 (×13): 1 via ORAL
  Filled 2015-11-23 (×14): qty 1

## 2015-11-23 MED ORDER — DEXTROSE 5 % IV SOLN
2.2500 g | Freq: Three times a day (TID) | INTRAVENOUS | Status: DC
Start: 2015-11-23 — End: 2015-11-25
  Administered 2015-11-23 – 2015-11-25 (×6): 2.25 g via INTRAVENOUS
  Filled 2015-11-23 (×9): qty 2.25

## 2015-11-23 MED ORDER — METOPROLOL SUCCINATE ER 50 MG PO TB24
50.0000 mg | ORAL_TABLET | Freq: Two times a day (BID) | ORAL | Status: DC
  Administered 2015-11-23 – 2015-12-02 (×13): 50 mg via ORAL
  Filled 2015-11-23 (×16): qty 1

## 2015-11-23 MED ORDER — SODIUM CHLORIDE 0.9 % IV BOLUS (SEPSIS)
250.0000 mL | Freq: Once | INTRAVENOUS | Status: AC
  Administered 2015-11-23: 250 mL via INTRAVENOUS

## 2015-11-23 MED ORDER — LEVETIRACETAM 500 MG PO TABS
500.0000 mg | ORAL_TABLET | Freq: Two times a day (BID) | ORAL | Status: DC
  Filled 2015-11-23 (×2): qty 1

## 2015-11-23 MED ORDER — PIPERACILLIN-TAZOBACTAM 3.375 G IVPB 30 MIN
3.3750 g | INTRAVENOUS | Status: AC
  Administered 2015-11-23: 3.375 g via INTRAVENOUS
  Filled 2015-11-23: qty 50

## 2015-11-23 MED ORDER — PIPERACILLIN-TAZOBACTAM IN DEX 2-0.25 GM/50ML IV SOLN
2.2500 g | Freq: Three times a day (TID) | INTRAVENOUS | Status: DC
  Filled 2015-11-23 (×3): qty 50

## 2015-11-23 MED ORDER — ONDANSETRON HCL 4 MG PO TABS: 4.0000 mg | ORAL_TABLET | Freq: Four times a day (QID) | ORAL | Status: DC | PRN

## 2015-11-23 NOTE — ED Notes (Signed)
Pt reports cough, congestion, chills, and diarrhea since yesterday.  Pt has dialysis cath to r chest and reports is tender.  Last dialysis treatment was Saturday.

## 2015-11-23 NOTE — ED Notes (Signed)
Contacted vascular access group to access permacath for second blood culture draw.

## 2015-11-23 NOTE — ED Provider Notes (Signed)
CSN: [id number]     Arrival date & time 11/23/15  1246 History   First MD Initiated Contact with Patient 11/23/15 1315     Chief Complaint  Patient presents with  . Fever      Patient is a 25 y.o. male presenting with fever.  Fever Associated symptoms: chest pain, cough, diarrhea and nausea   Associated symptoms: no vomiting    patient presents with fever. He is HIV positive. He is on dialysis. He has had previous endocarditis and infected dialysis catheter. He still has a right sided chest dialysis catheter since his fistula is not mature yet. Began fevers day or 2 ago. Is a little bit diarrhea. Has had a cough and feels weak. Mild nasal congestion. No sore throat was states his chest feels tight. Fevers up to 103. Last dialyzed on Saturday, and today is Tuesday. States he was at dialysis today and they would not dialyze because his fever was 103.  Past Medical History  Diagnosis Date  . HIV (human immunodeficiency virus infection) (HCC)   . ESRD (end stage renal disease) (HCC)   . Staphylococcus aureus bacteremia with sepsis (HCC) 09/09/2014  . A-V fistula (HCC)     Placed on 10/27/2014 at [facility]; for future HD use.   . Systolic dysfunction, left ventricle 12/20/2014    EF 20%   . Asthma    Past Surgical History  Procedure Laterality Date  . Port a cath revision    . Tee without cardioversion N/A 09/14/2014    Procedure: TRANSESOPHAGEAL ECHOCARDIOGRAM (TEE);  Surgeon: Laurey Morale, MD;  Location: [facility] ENDOSCOPY;  Service: Cardiovascular;  Laterality: N/A;  will be at [facility] by mon  . Insertion of dialysis catheter Left 09/15/2014    Procedure: INSERTION OF DIALYSIS CATHETER;  Surgeon: Nada Libman, MD;  Location: [facility] OR;  Service: Vascular;  Laterality: Left;   Family History  Problem Relation Age of Onset  . Seizures Father    Social History  Substance Use Topics  . Smoking status: Current Every Day Smoker -- 1.00 packs/day for 11 years    Types: Cigarettes    Start date:  01/28/2003  . Smokeless tobacco: Never Used  . Alcohol Use: No    Review of Systems  Constitutional: Positive for fever and appetite change.  Respiratory: Positive for cough and shortness of breath.   Cardiovascular: Positive for chest pain.  Gastrointestinal: Positive for nausea and diarrhea. Negative for vomiting, abdominal pain and constipation.  Genitourinary: Negative for flank pain.  Musculoskeletal: Negative for back pain.  Skin: Negative for wound.  Neurological: Negative for light-headedness.  Psychiatric/Behavioral: Negative for agitation.      Allergies  Bee venom; Other; and Aspirin  Home Medications   Prior to Admission medications   Medication Sig Start Date End Date Taking? Authorizing Provider  albuterol (PROVENTIL HFA;VENTOLIN HFA) 108 (90 BASE) MCG/ACT inhaler Inhale 1-2 puffs into the lungs every 6 (six) hours as needed for wheezing or shortness of breath.   Yes Historical Provider, MD  emtricitabine (EMTRIVA) 200 MG capsule Take 200 mg by mouth 2 (two) times a week. Taken after dialysis on Tuesday and Thursday   Yes Historical Provider, MD  etravirine (INTELENCE) 100 MG tablet Take 200 mg by mouth every 12 (twelve) hours.   Yes Historical Provider, MD  ibuprofen (ADVIL,MOTRIN) 200 MG tablet Take 200 mg by mouth every 6 (six) hours as needed for fever or moderate pain.   Yes Historical Provider, MD  lisinopril (PRINIVIL,ZESTRIL) 2.5  MG tablet Take 1 tablet (2.5 mg total) by mouth daily. 11/19/15  Yes Laqueta Linden, MD  metoprolol succinate (TOPROL-XL) 50 MG 24 hr tablet Take 1 tablet (50 mg total) by mouth 2 (two) times daily. Take with or immediately following a meal. 11/19/15  Yes Laqueta Linden, MD  raltegravir (ISENTRESS) 400 MG tablet Take 400 mg by mouth 2 (two) times daily.   Yes Historical Provider, MD  sulfamethoxazole-trimethoprim (BACTRIM,SEPTRA) 400-80 MG tablet Take 1 tablet by mouth 3 (three) times a week. To take after dialysis days   Yes  Historical Provider, MD  tenofovir (VIREAD) 300 MG tablet Take 300 mg by mouth once a week. To be taken on Tuesday after dialysis   Yes Historical Provider, MD   BP 127/88 mmHg  Pulse 129  Temp(Src) 98 F (36.7 C) (Oral)  Resp 16  Ht  (1.651 m)  Wt 128 lb (58.06 kg)  BMI 21.30 kg/m2  SpO2 98% Physical Exam  Constitutional: He appears well-developed.  HENT:  Head: Atraumatic.  Neck: Neck supple.  Cardiovascular:  Tachycardia.  Pulmonary/Chest: He has wheezes.  Mildly harsh breath sounds with some diffuse scattered wheezes. Right-sided chest wall dialysis catheter. Mildly dirty. No sutures in place.  Abdominal: Soft. There is no tenderness.  Musculoskeletal: He exhibits no tenderness.  Neurological: He is alert.  Skin: Skin is warm.    ED Course  Procedures (including critical care time) Labs Review Labs Reviewed  COMPREHENSIVE METABOLIC PANEL - Abnormal; Notable for the following:    Chloride 100 (*)    CO2 20 (*)    Glucose, Bld 107 (*)    BUN 60 (*)    Creatinine, Ser 10.61 (*)    Calcium 8.8 (*)    Albumin 3.2 (*)    Total Bilirubin 1.6 (*)    GFR calc non Af Amer 6 (*)    GFR calc Af Amer 7 (*)    Anion gap 16 (*)    All other components within normal limits  CBC WITH DIFFERENTIAL/PLATELET - Abnormal; Notable for the following:    WBC 12.9 (*)    RBC 3.36 (*)    Hemoglobin 12.5 (*)    HCT 37.7 (*)    MCV 112.2 (*)    MCH 37.2 (*)    Neutro Abs 11.7 (*)    Lymphs Abs 0.6 (*)    All other components within normal limits  I-STAT CG4 LACTIC ACID, ED - Abnormal; Notable for the following:    Lactic Acid, Venous 2.55 (*)    All other components within normal limits  CULTURE, BLOOD (ROUTINE X 2)  CULTURE, BLOOD (ROUTINE X 2)  MRSA PCR SCREENING  URINALYSIS, ROUTINE W REFLEX MICROSCOPIC (NOT AT [facility])  CBC  COMPREHENSIVE METABOLIC PANEL    Imaging Review Dg Chest Portable 1 View  11/23/2015  CLINICAL DATA:  Cough and congestion.  Chronic renal failure  EXAM: PORTABLE CHEST 1 VIEW COMPARISON:  Chest radiograph and chest CT September 11, 2015 FINDINGS: There is no apparent edema or consolidation. There is generalized cardiac enlargement with pulmonary vascularity within normal limits. No adenopathy. Central catheter tip is in the superior vena cava. No pneumothorax. IMPRESSION: Generalized cardiac enlargement. Cannot exclude underlying pericardial effusion. No edema or consolidation. No pneumothorax. Electronically Signed   By: Bretta Bang III M.D.   On: 11/23/2015 13:49   I have personally reviewed and evaluated these images and lab results as part of my medical decision-making.   EKG Interpretation None  MDM   Final diagnoses:  Fever, unspecified fever cause  End-stage renal disease on hemodialysis [facility])    Patient with fever. Has had previous endocarditis and infected dialysis catheter. X-ray does not show pneumonia. Started on Zosyn and vancomycin. Culture sent. Will admit to internal medicine. Lactic acid mildly elevated, however he is not hypotensive and he is a dialysis patient that missed dialysis today. He also has an ejection fraction of 10%. He was not given 30 mL/kg fluid bolus.    Benjiman Core, MD 11/23/15 2209

## 2015-11-23 NOTE — ED Notes (Signed)
I called the pharmacy and confirmed that this patient is NOT on Keppra. Patient states that his father is on this medication. I called the pharmacy back and they confirmed that this medication was filled under the patient by mistake. No other changes were made to this med rec.

## 2015-11-23 NOTE — ED Notes (Signed)
Vancomycin arrived from pharmacy

## 2015-11-23 NOTE — ED Notes (Signed)
Pt states that he cannot keep a dressing on his permacath.  Has a bandaid covering the site at this time.  States that it has been that way for several days.  No redness or drainage from site, but pt reports pain around permacath site.

## 2015-11-23 NOTE — Progress Notes (Signed)
ANTIBIOTIC CONSULT NOTE-Preliminary  Pharmacy Consult for Vancomycin and Zosyn Indication: sepsis  Allergies  Allergen Reactions  . Bee Venom Anaphylaxis  . Other Anaphylaxis    mushrooms  . Aspirin Other (See Comments)    Reaction is unknown    Patient Measurements: Height: 5\' 5"  (165.1 cm) Weight: 128 lb (58.06 kg) IBW/kg (Calculated) : 61.5  Vital Signs: Temp: 99.7 F (37.6 C) (02/07 1629) Temp Source: Oral (02/07 1546) BP: 106/79 mmHg (02/07 1629) Pulse Rate: 112 (02/07 1629)  Labs:  Recent Labs  11/23/15 1325  WBC 12.9*  HGB 12.5*  PLT 181  CREATININE 10.61*    Estimated Creatinine Clearance: 8.7 mL/min (by C-G formula based on Cr of 10.61).  No results for input(s): VANCOTROUGH, VANCOPEAK, VANCORANDOM, GENTTROUGH, GENTPEAK, GENTRANDOM, TOBRATROUGH, TOBRAPEAK, TOBRARND, AMIKACINPEAK, AMIKACINTROU, AMIKACIN in the last 72 hours.   Microbiology: No results found for this or any previous visit (from the past 720 hour(s)).  Medical History: Past Medical History  Diagnosis Date  . HIV (human immunodeficiency virus infection) (HCC)   . ESRD (end stage renal disease) (HCC)   . Staphylococcus aureus bacteremia with sepsis (HCC) 09/09/2014  . A-V fistula (HCC)     Placed on 10/27/2014 at [facility]; for future HD use.   . Systolic dysfunction, left ventricle 12/20/2014    EF 20%   . Asthma     Medications:  Prescriptions prior to admission  Medication Sig Dispense Refill Last Dose  . albuterol (PROVENTIL HFA;VENTOLIN HFA) 108 (90 BASE) MCG/ACT inhaler Inhale 1-2 puffs into the lungs every 6 (six) hours as needed for wheezing or shortness of breath.   Taking  . emtricitabine (EMTRIVA) 200 MG capsule Take 200 mg by mouth 2 (two) times a week. Taken after dialysis on Tuesday and Thursday   11/23/2015 at 1000a  . etravirine (INTELENCE) 100 MG tablet Take 200 mg by mouth every 12 (twelve) hours.   11/23/2015 at 1000a  . levETIRAcetam (KEPPRA) 250 MG tablet Take 250 mg by  mouth 2 (two) times daily. 1bid 1 week then 500bid thereafter     . lisinopril (PRINIVIL,ZESTRIL) 2.5 MG tablet Take 1 tablet (2.5 mg total) by mouth daily. 90 tablet 3 11/23/2015 at Unknown time  . metoprolol succinate (TOPROL-XL) 50 MG 24 hr tablet Take 1 tablet (50 mg total) by mouth 2 (two) times daily. Take with or immediately following a meal. 180 tablet 3 11/23/2015 at 1000a  . raltegravir (ISENTRESS) 400 MG tablet Take 400 mg by mouth 2 (two) times daily.   Taking  . sulfamethoxazole-trimethoprim (BACTRIM,SEPTRA) 400-80 MG tablet Take 1 tablet by mouth 3 (three) times a week. To take after dialysis days     . tenofovir (VIREAD) 300 MG tablet Take 300 mg by mouth once a week. To be taken on Tuesday after dialysis   Taking  . ibuprofen (ADVIL,MOTRIN) 200 MG tablet Take 200 mg by mouth every 6 (six) hours as needed for fever or moderate pain.   Taking  . levofloxacin (LEVAQUIN) 500 MG tablet Take 1 tablet (500 mg total) by mouth every other day. Start 11/29 in evening after dialysis. (Patient not taking: Reported on 11/23/2015) 4 tablet 0 Taking  . sulfamethoxazole-trimethoprim (BACTRIM DS,SEPTRA DS) 800-160 MG tablet Take 2 tablets by mouth daily. Last dose 12/16 (Patient not taking: Reported on 11/23/2015) 36 tablet 0 Taking   Anti-infectives    Start     Dose/Rate Route Frequency Ordered Stop   11/25/15 0900  emtricitabine (EMTRIVA) capsule 200 mg  200 mg Oral Once per day on Mon Thu 11/23/15 1627     11/24/15 0900  sulfamethoxazole-trimethoprim (BACTRIM DS,SEPTRA DS) 800-160 MG per tablet 0.5 tablet     0.5 tablet Oral Once per day on Mon Wed Fri 11/23/15 1627     11/23/15 2200  etravirine (INTELENCE) tablet 200 mg     200 mg Oral Every 12 hours 11/23/15 1627     11/23/15 2200  raltegravir (ISENTRESS) tablet 400 mg     400 mg Oral 2 times daily 11/23/15 1627     11/23/15 2200  piperacillin-tazobactam (ZOSYN) IVPB 2.25 g     2.25 g 100 mL/hr over 30 Minutes Intravenous Every 8 hours 11/23/15  1633     11/23/15 1800  tenofovir (VIREAD) tablet 300 mg     300 mg Oral Weekly 11/23/15 1627     11/23/15 1515  vancomycin (VANCOCIN) 1,500 mg in sodium chloride 0.9 % 500 mL IVPB     1,500 mg 250 mL/hr over 120 Minutes Intravenous  Once 11/23/15 1414     11/23/15 1415  piperacillin-tazobactam (ZOSYN) IVPB 3.375 g     3.375 g 100 mL/hr over 30 Minutes Intravenous STAT 11/23/15 1414 11/23/15 1500     Assessment: 25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  Asked to initiate Vancomycin and Zosyn for sepsis.   Goal of Therapy:  Eradicate infection.  Plan:  Zosyn 2.25gm IV q8h Vancomycin  IV today x 1 F/U dialysis schedule for additional Vanc doses Monitor progress, labs, and cultures  Wayland Denis, RPH 11/23/2015,4:33 PM

## 2015-11-23 NOTE — H&P (Signed)
PCP:   [name] [name], MD   Chief Complaint:  Fever  HPI: 25 year old male who   has a past medical history of HIV (human immunodeficiency virus infection) (HCC); ESRD (end stage renal disease) (HCC); Staphylococcus aureus bacteremia with sepsis (HCC) (09/09/2014); A-V fistula (HCC); Systolic dysfunction, left ventricle (12/20/2014); and Asthma. Today presents to the hospital with history of fever, cough since yesterday. Patient gets hemodialysis Tuesday Thursday and Saturday. Last dialysis was on Saturday and today dialysis was not done because of the fever. He denies nausea but had one episode of vomiting yesterday. No diarrhea. No shortness of breath. He complains of coughing up red colored phlegm. Chest x-ray showed no significant abnormality. Patient was supposed to get cardiac MRI tomorrow as outpatient for nonischemic cardiopathy from HIV. In the ED WBC shows white count of 12,000.  Allergies:   Allergies  Allergen Reactions  . Bee Venom Anaphylaxis  . Other Anaphylaxis    mushrooms  . Aspirin Other (See Comments)    Reaction is unknown      Past Medical History  Diagnosis Date  . HIV (human immunodeficiency virus infection) (HCC)   . ESRD (end stage renal disease) (HCC)   . Staphylococcus aureus bacteremia with sepsis (HCC) 09/09/2014  . A-V fistula (HCC)     Placed on 10/27/2014 at [facility]; for future HD use.   . Systolic dysfunction, left ventricle 12/20/2014    EF 20%   . Asthma     Past Surgical History  Procedure Laterality Date  . Port a cath revision    . Tee without cardioversion N/A 09/14/2014    Procedure: TRANSESOPHAGEAL ECHOCARDIOGRAM (TEE);  Surgeon: Laurey Morale, MD;  Location: [facility] ENDOSCOPY;  Service: Cardiovascular;  Laterality: N/A;  will be at [facility] by mon  . Insertion of dialysis catheter Left 09/15/2014    Procedure: INSERTION OF DIALYSIS CATHETER;  Surgeon: Nada Libman, MD;  Location: [facility] OR;  Service: Vascular;  Laterality: Left;    Prior to  Admission medications   Medication Sig Start Date End Date Taking? Authorizing Provider  albuterol (PROVENTIL HFA;VENTOLIN HFA) 108 (90 BASE) MCG/ACT inhaler Inhale 1-2 puffs into the lungs every 6 (six) hours as needed for wheezing or shortness of breath.   Yes Historical Provider, MD  emtricitabine (EMTRIVA) 200 MG capsule Take 200 mg by mouth 2 (two) times a week. Taken after dialysis on Tuesday and Thursday   Yes Historical Provider, MD  etravirine (INTELENCE) 100 MG tablet Take 200 mg by mouth every 12 (twelve) hours.   Yes Historical Provider, MD  levETIRAcetam (KEPPRA) 250 MG tablet Take 250 mg by mouth 2 (two) times daily. 1bid 1 week then 500bid thereafter   Yes Historical Provider, MD  lisinopril (PRINIVIL,ZESTRIL) 2.5 MG tablet Take 1 tablet (2.5 mg total) by mouth daily. 11/19/15  Yes Laqueta Linden, MD  metoprolol succinate (TOPROL-XL) 50 MG 24 hr tablet Take 1 tablet (50 mg total) by mouth 2 (two) times daily. Take with or immediately following a meal. 11/19/15  Yes Laqueta Linden, MD  raltegravir (ISENTRESS) 400 MG tablet Take 400 mg by mouth 2 (two) times daily.   Yes Historical Provider, MD  sulfamethoxazole-trimethoprim (BACTRIM,SEPTRA) 400-80 MG tablet Take 1 tablet by mouth 3 (three) times a week. To take after dialysis days   Yes Historical Provider, MD  tenofovir (VIREAD) 300 MG tablet Take 300 mg by mouth once a week. To be taken on Tuesday after dialysis   Yes Historical Provider, MD  ibuprofen (ADVIL,MOTRIN) 200 MG tablet Take 200 mg by mouth every 6 (six) hours as needed for fever or moderate pain.    Historical Provider, MD  levofloxacin (LEVAQUIN) 500 MG tablet Take 1 tablet (500 mg total) by mouth every other day. Start 11/29 in evening after dialysis. Patient not taking: Reported on 11/23/2015 09/14/15   Standley Brooking, MD  sulfamethoxazole-trimethoprim (BACTRIM DS,SEPTRA DS) 800-160 MG tablet Take 2 tablets by mouth daily. Last dose 12/16 Patient not taking:  Reported on 11/23/2015 09/13/15   Standley Brooking, MD    Social History:  reports that he has been smoking Cigarettes.  He started smoking about 12 years ago. He has a 11 pack-year smoking history. He has never used smokeless tobacco. He reports that he uses illicit drugs (Marijuana). He reports that he does not drink alcohol.  Family History  Problem Relation Age of Onset  . Seizures Father     Ceasar Mons Weights   11/23/15 1256 11/23/15 1629  Weight: 58.06 kg (128 lb) 58.06 kg (128 lb)    All the positives are listed in BOLD  Review of Systems:  HEENT: Headache, blurred vision, runny nose, sore throat Neck: Hypothyroidism, hyperthyroidism,,lymphadenopathy Chest : Shortness of breath, history of COPD, Asthma Heart : Chest pain, history of coronary arterey disease GI:  Nausea, vomiting, diarrhea, constipation, GERD GU: Dysuria, urgency, frequency of urination, hematuria Neuro: Stroke, seizures, syncope Psych: Depression, anxiety, hallucinations   Physical Exam: Blood pressure 106/79, pulse 112, temperature 99.7 F (37.6 C), temperature source Oral, resp. rate 16, height 5\' 5"  (1.651 m), weight 58.06 kg (128 lb), SpO2 96 %. Constitutional:   Patient is a well-developed and well-nourished male* in no acute distress and cooperative with exam. Head: Normocephalic and atraumatic Mouth: Mucus membranes moist Eyes: PERRL, EOMI, conjunctivae normal Neck: Supple, No Thyromegaly Cardiovascular: RRR, S1 normal, S2 normal Pulmonary/Chest: Bilateral rhonchi Abdominal: Soft. Non-tender, non-distended, bowel sounds are normal, no masses, organomegaly, or guarding present.  Neurological: A&O x3, Strength is normal and symmetric bilaterally, cranial nerve II-XII are grossly intact, no focal motor deficit, sensory intact to light touch bilaterally.  Extremities : No Cyanosis, Clubbing or Edema  Labs on Admission:  Basic Metabolic Panel:  Recent Labs Lab 11/23/15 1325  NA 136  K 4.5  CL  100*  CO2 20*  GLUCOSE 107*  BUN 60*  CREATININE 10.61*  CALCIUM 8.8*   Liver Function Tests:  Recent Labs Lab 11/23/15 1325  AST 37  ALT 20  ALKPHOS 68  BILITOT 1.6*  PROT 6.9  ALBUMIN 3.2*   CBC:  Recent Labs Lab 11/23/15 1325  WBC 12.9*  NEUTROABS 11.7*  HGB 12.5*  HCT 37.7*  MCV 112.2*  PLT 181    BNP (last 3 results)  Recent Labs  09/11/15 0150  BNP >4500.0*     Radiological Exams on Admission: Dg Chest Portable 1 View  11/23/2015  CLINICAL DATA:  Cough and congestion.  Chronic renal failure EXAM: PORTABLE CHEST 1 VIEW COMPARISON:  Chest radiograph and chest CT September 11, 2015 FINDINGS: There is no apparent edema or consolidation. There is generalized cardiac enlargement with pulmonary vascularity within normal limits. No adenopathy. Central catheter tip is in the superior vena cava. No pneumothorax. IMPRESSION: Generalized cardiac enlargement. Cannot exclude underlying pericardial effusion. No edema or consolidation. No pneumothorax. Electronically Signed   By: Bretta Bang III M.D.   On: 11/23/2015 13:49    EKG: Independently reviewed. Sinus tachycardia   Assessment/Plan Active Problems:   HIV (  human immunodeficiency virus infection) (HCC)   ESRD on hemodialysis (HCC)   Fever   Fever Patient has history of hemodialysis catheter infection requiring IV antibiotics. Healthcare associated pneumonia diagnosed in November. Today patient presented with fever but chest x-ray showed no significant abnormality. We'll empirically treat for healthcare associated pneumonia with vancomycin and Zosyn, DuoNeb nebulizers every 6 hours. If patient improves by tomorrow, can narrow down the antibiotics.  HIV Continue antiretroviral therapy  ESRD on hemodialysis Patient is currently on hemodialysis. He did not get dialysis today due to fever. Will consult nephrology for possible dialysis in a.m.  Nonischemic cardio myopathy Continue metoprolol,  lisinopril Patient scheduled to get outpatient cardiac MRI  DVT prophylaxis SCDs, will avoid Lovenox as patient complains of hemoptysis  Code status: Full code  Family discussion: Admission, patients condition and plan of care including tests being ordered have been discussed with the patient and his mother and father at bedside* who indicate understanding and agree with the plan and Code Status.   Time Spent on Admission: 60 min  Shadavia Dampier S [facility] Pager: [phone] 11/23/2015, 4:32 PM  If 7PM-7AM, please contact night-coverage  [email or web address]  Password [email or web address]

## 2015-11-24 ENCOUNTER — Ambulatory Visit (HOSPITAL_COMMUNITY): Admission: RE | Admit: 2015-11-24 | Payer: Medicare Other | Source: Ambulatory Visit

## 2015-11-24 DIAGNOSIS — R509 Fever, unspecified: Secondary | ICD-10-CM | POA: Diagnosis not present

## 2015-11-24 DIAGNOSIS — A4101 Sepsis due to Methicillin susceptible Staphylococcus aureus: Secondary | ICD-10-CM | POA: Diagnosis not present

## 2015-11-24 DIAGNOSIS — R112 Nausea with vomiting, unspecified: Secondary | ICD-10-CM | POA: Diagnosis not present

## 2015-11-24 DIAGNOSIS — E875 Hyperkalemia: Secondary | ICD-10-CM

## 2015-11-24 DIAGNOSIS — M545 Low back pain: Secondary | ICD-10-CM | POA: Diagnosis present

## 2015-11-24 DIAGNOSIS — E162 Hypoglycemia, unspecified: Secondary | ICD-10-CM | POA: Diagnosis not present

## 2015-11-24 DIAGNOSIS — A4102 Sepsis due to Methicillin resistant Staphylococcus aureus: Secondary | ICD-10-CM

## 2015-11-24 DIAGNOSIS — R109 Unspecified abdominal pain: Secondary | ICD-10-CM | POA: Diagnosis not present

## 2015-11-24 DIAGNOSIS — Z992 Dependence on renal dialysis: Secondary | ICD-10-CM | POA: Diagnosis not present

## 2015-11-24 DIAGNOSIS — I429 Cardiomyopathy, unspecified: Secondary | ICD-10-CM | POA: Diagnosis not present

## 2015-11-24 DIAGNOSIS — R7881 Bacteremia: Secondary | ICD-10-CM | POA: Diagnosis not present

## 2015-11-24 DIAGNOSIS — D696 Thrombocytopenia, unspecified: Secondary | ICD-10-CM | POA: Diagnosis not present

## 2015-11-24 DIAGNOSIS — J45909 Unspecified asthma, uncomplicated: Secondary | ICD-10-CM | POA: Diagnosis present

## 2015-11-24 DIAGNOSIS — R188 Other ascites: Secondary | ICD-10-CM | POA: Diagnosis not present

## 2015-11-24 DIAGNOSIS — G8929 Other chronic pain: Secondary | ICD-10-CM | POA: Diagnosis present

## 2015-11-24 DIAGNOSIS — Z4901 Encounter for fitting and adjustment of extracorporeal dialysis catheter: Secondary | ICD-10-CM | POA: Diagnosis not present

## 2015-11-24 DIAGNOSIS — D631 Anemia in chronic kidney disease: Secondary | ICD-10-CM | POA: Diagnosis present

## 2015-11-24 DIAGNOSIS — R7989 Other specified abnormal findings of blood chemistry: Secondary | ICD-10-CM | POA: Diagnosis not present

## 2015-11-24 DIAGNOSIS — I120 Hypertensive chronic kidney disease with stage 5 chronic kidney disease or end stage renal disease: Secondary | ICD-10-CM | POA: Diagnosis present

## 2015-11-24 DIAGNOSIS — I252 Old myocardial infarction: Secondary | ICD-10-CM | POA: Diagnosis not present

## 2015-11-24 DIAGNOSIS — K5900 Constipation, unspecified: Secondary | ICD-10-CM | POA: Diagnosis present

## 2015-11-24 DIAGNOSIS — E871 Hypo-osmolality and hyponatremia: Secondary | ICD-10-CM | POA: Diagnosis not present

## 2015-11-24 DIAGNOSIS — T827XXA Infection and inflammatory reaction due to other cardiac and vascular devices, implants and grafts, initial encounter: Secondary | ICD-10-CM | POA: Diagnosis present

## 2015-11-24 DIAGNOSIS — Z21 Asymptomatic human immunodeficiency virus [HIV] infection status: Secondary | ICD-10-CM | POA: Diagnosis not present

## 2015-11-24 DIAGNOSIS — I339 Acute and subacute endocarditis, unspecified: Secondary | ICD-10-CM | POA: Diagnosis not present

## 2015-11-24 DIAGNOSIS — Y838 Other surgical procedures as the cause of abnormal reaction of the patient, or of later complication, without mention of misadventure at the time of the procedure: Secondary | ICD-10-CM | POA: Diagnosis present

## 2015-11-24 DIAGNOSIS — F17210 Nicotine dependence, cigarettes, uncomplicated: Secondary | ICD-10-CM | POA: Diagnosis present

## 2015-11-24 DIAGNOSIS — Z886 Allergy status to analgesic agent status: Secondary | ICD-10-CM | POA: Diagnosis not present

## 2015-11-24 DIAGNOSIS — N186 End stage renal disease: Secondary | ICD-10-CM | POA: Diagnosis not present

## 2015-11-24 DIAGNOSIS — B20 Human immunodeficiency virus [HIV] disease: Secondary | ICD-10-CM | POA: Diagnosis not present

## 2015-11-24 LAB — COMPREHENSIVE METABOLIC PANEL
ALT: 34 U/L (ref 17–63)
ANION GAP: 19 — AB (ref 5–15)
AST: 62 U/L — AB (ref 15–41)
Albumin: 3.3 g/dL — ABNORMAL LOW (ref 3.5–5.0)
Alkaline Phosphatase: 65 U/L (ref 38–126)
BILIRUBIN TOTAL: 3.9 mg/dL — AB (ref 0.3–1.2)
BUN: 72 mg/dL — AB (ref 6–20)
CO2: 18 mmol/L — ABNORMAL LOW (ref 22–32)
Calcium: 8.9 mg/dL (ref 8.9–10.3)
Chloride: 98 mmol/L — ABNORMAL LOW (ref 101–111)
Creatinine, Ser: 11.78 mg/dL — ABNORMAL HIGH (ref 0.61–1.24)
GFR calc Af Amer: 6 mL/min — ABNORMAL LOW (ref >60)
GFR, EST NON AFRICAN AMERICAN: 5 mL/min — AB (ref >60)
Glucose, Bld: 89 mg/dL (ref 65–99)
POTASSIUM: 6 mmol/L — AB (ref 3.5–5.1)
Sodium: 135 mmol/L (ref 135–145)
TOTAL PROTEIN: 7 g/dL (ref 6.5–8.1)

## 2015-11-24 LAB — PROTIME-INR
INR: 1.92 — ABNORMAL HIGH (ref 0.00–1.49)
PROTHROMBIN TIME: 21.9 s — AB (ref 11.6–15.2)

## 2015-11-24 LAB — CBC
HEMATOCRIT: 42.5 % (ref 39.0–52.0)
Hemoglobin: 14 g/dL (ref 13.0–17.0)
MCH: 37.7 pg — ABNORMAL HIGH (ref 26.0–34.0)
MCHC: 32.9 g/dL (ref 30.0–36.0)
MCV: 114.6 fL — AB (ref 78.0–100.0)
Platelets: 147 10*3/uL — ABNORMAL LOW (ref 150–400)
RBC: 3.71 MIL/uL — ABNORMAL LOW (ref 4.22–5.81)
RDW: 15.5 % (ref 11.5–15.5)
WBC: 15.5 10*3/uL — ABNORMAL HIGH (ref 4.0–10.5)

## 2015-11-24 LAB — LACTIC ACID, PLASMA
LACTIC ACID, VENOUS: 9.3 mmol/L — AB (ref 0.5–2.0)
Lactic Acid, Venous: 2.7 mmol/L (ref 0.5–2.0)

## 2015-11-24 LAB — PROCALCITONIN: PROCALCITONIN: 26.36 ng/mL

## 2015-11-24 LAB — APTT: aPTT: 35 seconds (ref 24–37)

## 2015-11-24 MED ORDER — SODIUM CHLORIDE 0.9 % IV SOLN: 100.0000 mL | INTRAVENOUS | Status: DC | PRN

## 2015-11-24 MED ORDER — VANCOMYCIN HCL 500 MG IV SOLR
500.0000 mg | INTRAVENOUS | Status: DC
  Administered 2015-11-24 – 2015-12-03 (×5): 500 mg via INTRAVENOUS
  Filled 2015-11-24 (×7): qty 500

## 2015-11-24 MED ORDER — SODIUM CHLORIDE 0.9 % IV BOLUS (SEPSIS)
250.0000 mL | Freq: Once | INTRAVENOUS | Status: AC
  Administered 2015-11-25: 250 mL via INTRAVENOUS

## 2015-11-24 MED ORDER — MUPIROCIN 2 % EX OINT
1.0000 "application " | TOPICAL_OINTMENT | Freq: Two times a day (BID) | CUTANEOUS | Status: AC
  Administered 2015-11-24 – 2015-11-28 (×9): 1 via NASAL
  Filled 2015-11-24 (×3): qty 22

## 2015-11-24 MED ORDER — CHLORHEXIDINE GLUCONATE CLOTH 2 % EX PADS
6.0000 | MEDICATED_PAD | Freq: Every day | CUTANEOUS | Status: AC
Start: 2015-11-24 — End: 2015-11-29
  Administered 2015-11-24 – 2015-11-26 (×3): 6 via TOPICAL

## 2015-11-24 MED ORDER — SODIUM CHLORIDE 0.9 % IV BOLUS (SEPSIS)
1000.0000 mL | INTRAVENOUS | Status: AC
  Administered 2015-11-24 (×2): 1000 mL via INTRAVENOUS

## 2015-11-24 MED ORDER — IPRATROPIUM-ALBUTEROL 0.5-2.5 (3) MG/3ML IN SOLN
3.0000 mL | Freq: Three times a day (TID) | RESPIRATORY_TRACT | Status: DC
  Administered 2015-11-25 – 2015-11-30 (×12): 3 mL via RESPIRATORY_TRACT
  Filled 2015-11-24 (×18): qty 3

## 2015-11-24 MED ORDER — LISINOPRIL 5 MG PO TABS: 2.5000 mg | ORAL_TABLET | Freq: Every day | ORAL | Status: DC

## 2015-11-24 MED ORDER — SODIUM CHLORIDE 0.9 % IV BOLUS (SEPSIS): 500.0000 mL | Freq: Once | INTRAVENOUS | Status: DC

## 2015-11-24 MED ORDER — ALTEPLASE 2 MG IJ SOLR
2.0000 mg | Freq: Once | INTRAMUSCULAR | Status: DC | PRN
  Filled 2015-11-24: qty 2

## 2015-11-24 MED ORDER — HEPARIN SODIUM (PORCINE) 1000 UNIT/ML DIALYSIS
1000.0000 [IU] | INTRAMUSCULAR | Status: DC | PRN
  Administered 2015-12-01 – 2015-12-03 (×2): 2800 [IU] via INTRAVENOUS_CENTRAL
  Filled 2015-11-24 (×3): qty 1

## 2015-11-24 NOTE — Progress Notes (Signed)
NIGHT:     Please note both aerobic and anaerobic BC were positive for GPC in cluster.  On Van/Zosyn.   Houston Siren, MD FACP.

## 2015-11-24 NOTE — Progress Notes (Signed)
CRITICAL VALUE ALERT  Critical value received:  Lactic acid 9.3  Date of notification:  11/24/15  Time of notification: 1700  Critical value read back:Yes.    Nurse who received alert:  Darvin Neighbours, RN  MD notified (1st page):  Dr. Kerry Hough   Time of first page:  1705 MD notified (2nd page):  Time of second page:  Responding MD: Dr. Kerry Hough  Time MD responded:  [phone]

## 2015-11-24 NOTE — Procedures (Signed)
   HEMODIALYSIS TREATMENT NOTE:  4 hour heparin-free dialysis completed via right chest wall tunneled catheter. Kept even; no fluid removal per nephrologist's order. Dr. Kerry Hough had ordered a 2L bolus pre-HD.  Primary RN administered 1L prior to transferring pt to HD room. The second liter was administered over the course of the HD treatment. All blood was reinfused. Pt afebrile and hemodynamically stable. Vancomycin and Zosyn given after BCx2 were obtained. Report called to Darvin Neighbours, RN.  Arman Filter, RN, CDN

## 2015-11-24 NOTE — Progress Notes (Signed)
CRITICAL VALUE ALERT  Critical value received:  Lactic Acid  Date of notification:  11/24/2015  Time of notification:  19:58  Critical value read back: yes  Nurse who received alert:  Felecia Jan  MD notified (1st page):  Memon/Schorr  Time of first page:  20:01  MD notified (2nd page):  Time of second page:  Responding MD:  Schorr  Time MD responded:  20:15

## 2015-11-24 NOTE — Consult Note (Signed)
Dave Estrada MRN: [id number] DOB/AGE: 1991-04-19 24 y.o. Primary Care Physician:JASON [name], MD Admit date: 11/23/2015 Chief Complaint:  Chief Complaint  Patient presents with  . Fever   HPI: Pt is 25 year old male with past medical hx of ESRD who presented to Emergency room with c/o fever.  HPI dates back to 2-3 days ago when pt started having cough, productive with reddish phlegm. Pt  c/o fever . Pt was started on empiric antibiotics  And  admitted for further care . Pt blood cultures later on came back postive for gram positive cocci Pt seen today at 3rd floor.Pt main complaints is that " I do not feel good" . NO c/o chest pain No c/o dyspnea  no c/o nausea/ vomiting No c/o abdominal pain No c/o syncope No c/o recent travel Pt last Dialysis was on Saturday  Past Medical History  Diagnosis Date  . HIV (human immunodeficiency virus infection) (HCC)   . ESRD (end stage renal disease) (HCC)   . Staphylococcus aureus bacteremia with sepsis (HCC) 09/09/2014  . A-V fistula (HCC)     Placed on 10/27/2014 at [facility]; for future HD use.   . Systolic dysfunction, left ventricle 12/20/2014    EF 20%   . Asthma         Family History  Problem Relation Age of Onset  . Seizures Father     Social History:  reports that he has been smoking Cigarettes.  He started smoking about 12 years ago. He has a 11 pack-year smoking history. He has never used smokeless tobacco. He reports that he uses illicit drugs (Marijuana). He reports that he does not drink alcohol.  Allergies:  Allergies  Allergen Reactions  . Bee Venom Anaphylaxis  . Other Anaphylaxis    mushrooms  . Aspirin Other (See Comments)    Reaction is unknown    Medications Prior to Admission  Medication Sig Dispense Refill  . albuterol (PROVENTIL HFA;VENTOLIN HFA) 108 (90 BASE) MCG/ACT inhaler Inhale 1-2 puffs into the lungs every 6 (six) hours as needed for wheezing or shortness of breath.    Marland Kitchen emtricitabine  (EMTRIVA) 200 MG capsule Take 200 mg by mouth 2 (two) times a week. Taken after dialysis on Tuesday and Thursday    . etravirine (INTELENCE) 100 MG tablet Take 200 mg by mouth every 12 (twelve) hours.    Marland Kitchen ibuprofen (ADVIL,MOTRIN) 200 MG tablet Take 200 mg by mouth every 6 (six) hours as needed for fever or moderate pain.    Marland Kitchen lisinopril (PRINIVIL,ZESTRIL) 2.5 MG tablet Take 1 tablet (2.5 mg total) by mouth daily. 90 tablet 3  . metoprolol succinate (TOPROL-XL) 50 MG 24 hr tablet Take 1 tablet (50 mg total) by mouth 2 (two) times daily. Take with or immediately following a meal. 180 tablet 3  . raltegravir (ISENTRESS) 400 MG tablet Take 400 mg by mouth 2 (two) times daily.    Marland Kitchen sulfamethoxazole-trimethoprim (BACTRIM,SEPTRA) 400-80 MG tablet Take 1 tablet by mouth 3 (three) times a week. To take after dialysis days    . tenofovir (VIREAD) 300 MG tablet Take 300 mg by mouth once a week. To be taken on Tuesday after dialysis         YME:BRAXE from the symptoms mentioned above,there are no other symptoms referable to all systems reviewed.  . Chlorhexidine Gluconate Cloth  6 each Topical Q0600  . [START ON 11/25/2015] emtricitabine  200 mg Oral Once per day on Mon Thu  . etravirine  200 mg Oral Q12H  . guaiFENesin  1,200 mg Oral BID  . ipratropium-albuterol  3 mL Nebulization Q6H  . levETIRAcetam  500 mg Oral BID  . lisinopril  2.5 mg Oral Daily  . metoprolol succinate  50 mg Oral BID  . mupirocin ointment  1 application Nasal BID  . piperacillin-tazobactam (ZOSYN)  IV  2.25 g Intravenous 3 times per day  . raltegravir  400 mg Oral BID  . sulfamethoxazole-trimethoprim  0.5 tablet Oral Once per day on Mon Wed Fri  . tenofovir  300 mg Oral Weekly     Physical Exam: Vital signs in last 24 hours: Temp:  [97.6 F (36.4 C)-101.1 F (38.4 C)] 97.6 F (36.4 C) (02/08 0655) Pulse Rate:  [102-129] 102 (02/08 0957) Resp:  [13-32] 16 (02/07 1629) BP: (97-127)/(76-100) 106/79 mmHg (02/08  0957) SpO2:  [93 %-100 %] 100 % (02/08 0655) Weight:  [128 lb (58.06 kg)] 128 lb (58.06 kg) (02/07 1629) Weight change:     Intake/Output from previous day: 02/07 0701 - 02/08 0700 In: 50 [IV Piggyback:50] Out: -      Physical Exam: General- pt is awake,alert, oriented to time place and person Resp- No acute REsp distress, CTA B/L NO Rhonchi CVS- S1S2 regular in rate and rhythm GIT- BS+, soft, NT, ND EXT- NO LE Edema, Cyanosis CNS- CN 2-12 grossly intact. Moving all 4 extremities Psych- normal mood and affect Access- PC                Left AVF ( NO bruit No thrill. Clotted)    Lab Results: CBC  Recent Labs  11/23/15 1325 11/24/15 0839  WBC 12.9* 15.5*  HGB 12.5* 14.0  HCT 37.7* 42.5  PLT 181 147*    BMET  Recent Labs  11/23/15 1325 11/24/15 0839  NA 136 135  K 4.5 6.0*  CL 100* 98*  CO2 20* 18*  GLUCOSE 107* 89  BUN 60* 72*  CREATININE 10.61* 11.78*  CALCIUM 8.8* 8.9    MICRO Recent Results (from the past 240 hour(s))  Culture, blood (routine x 2)     Status: None (Preliminary result)   Collection Time: 11/23/15  1:29 PM  Result Value Ref Range Status   Specimen Description BLOOD RIGHT ANTECUBITAL  Final   Special Requests   Final    BOTTLES DRAWN AEROBIC AND ANAEROBIC AEB=8CC ANA=6CC   Culture  Setup Time   Final    GRAM POSITIVE COCCI IN CLUSTERS RECOVERED FROM BOTH BOTTLES Gram Stain Report Called to,Read Back By and Verified With: THOMAS K AT 0406 ON [id number] BY FORSYTH K Performed at [facility]    Culture PENDING  Incomplete   Report Status PENDING  Incomplete  MRSA PCR Screening     Status: Abnormal   Collection Time: 11/23/15  6:44 PM  Result Value Ref Range Status   MRSA by PCR POSITIVE (A) NEGATIVE Final    Comment:        The GeneXpert MRSA Assay (FDA approved for NASAL specimens only), is one component of a comprehensive MRSA colonization surveillance program. It is not intended to diagnose MRSA infection nor to guide  or monitor treatment for MRSA infections. RESULT CALLED TO, READ BACK BY AND VERIFIED WITH: Vincent Peyer AT 2208 ON [id number] BY FORSYTH K   Culture, blood (routine x 2)     Status: None (Preliminary result)   Collection Time: 11/24/15  8:39 AM  Result Value Ref Range Status   Specimen Description  BLOOD  Final   Special Requests NONE  Final   Culture NO GROWTH <12 HOURS  Final   Report Status PENDING  Incomplete      Lab Results  Component Value Date   CALCIUM 8.9 11/24/2015   PHOS 7.4* 09/13/2015      Impression: 1)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               Pt is On Tues/Thurs/Satur schedule               Pt last HD session was on saturday                Will dialyze in am                Pt with PC/tunnelled cath                  Multiple AVF failed to mature  2)HTN BP stable   3)Anemia In ESRD the goal for HGb is 9--11. NO need of Epo   4)CKD Mineral-Bone Disorder Calcium Near to goal when corrected for low albumin Phos not at goal  5)ID- hx of HIV Admitted with fever Blood culture positive for G Positive cocci Final id pending Pt will need PC replacement . On IV vanco + Zosyn  6)Electrolytes  Hyperkalemic NOrmonatremic  7)Acid base Co2 NOt at goal NON AG acidosis sec to ESRD    Plan:  Will dilayze today Will dialyze again in am Will most likely d/c pc after hd tomorrow Will need cath free days Will place temporary cath for hd on Saturday   Addendum Pt seen on HD Pt tolerating tx well    Xayla Puzio S 11/24/2015, 10:16 AM

## 2015-11-24 NOTE — Progress Notes (Signed)
ANTIBIOTIC CONSULT NOTE- follow up  Pharmacy Consult for Vancomycin and Zosyn Indication: sepsis  Allergies  Allergen Reactions  . Bee Venom Anaphylaxis  . Other Anaphylaxis    mushrooms  . Aspirin Other (See Comments)    Reaction is unknown   Patient Measurements: Height:  (165.1 cm) Weight: 128 lb (58.06 kg) IBW/kg (Calculated) : 61.5  Vital Signs: Temp: 98.3 F (36.8 C) (02/08 1303) Temp Source: Oral (02/08 1303) BP: 102/70 mmHg (02/08 1303) Pulse Rate: 119 (02/08 1303)  Labs:  Recent Labs  11/23/15 1325 11/24/15 0839  WBC 12.9* 15.5*  HGB 12.5* 14.0  PLT 181 147*  CREATININE 10.61* 11.78*   Estimated Creatinine Clearance: 7.8 mL/min (by C-G formula based on Cr of 11.78).  No results for input(s): VANCOTROUGH, VANCOPEAK, VANCORANDOM, GENTTROUGH, GENTPEAK, GENTRANDOM, TOBRATROUGH, TOBRAPEAK, TOBRARND, AMIKACINPEAK, AMIKACINTROU, AMIKACIN in the last 72 hours.   Microbiology: Recent Results (from the past 720 hour(s))  Culture, blood (routine x 2)     Status: None (Preliminary result)   Collection Time: 11/23/15  1:29 PM  Result Value Ref Range Status   Specimen Description BLOOD RIGHT ANTECUBITAL  Final   Special Requests   Final    BOTTLES DRAWN AEROBIC AND ANAEROBIC AEB=8CC ANA=6CC   Culture  Setup Time   Final    GRAM POSITIVE COCCI IN CLUSTERS RECOVERED FROM BOTH BOTTLES Gram Stain Report Called to,Read Back By and Verified With: THOMAS K AT 0406 ON [id number] BY FORSYTH K Performed at [facility]    Culture PENDING  Incomplete   Report Status PENDING  Incomplete  MRSA PCR Screening     Status: Abnormal   Collection Time: 11/23/15  6:44 PM  Result Value Ref Range Status   MRSA by PCR POSITIVE (A) NEGATIVE Final    Comment:        The GeneXpert MRSA Assay (FDA approved for NASAL specimens only), is one component of a comprehensive MRSA colonization surveillance program. It is not intended to diagnose MRSA infection nor to guide  or monitor treatment for MRSA infections. RESULT CALLED TO, READ BACK BY AND VERIFIED WITH: Vincent Peyer AT 2208 ON [id number] BY FORSYTH K   Culture, blood (routine x 2)     Status: None (Preliminary result)   Collection Time: 11/24/15  8:39 AM  Result Value Ref Range Status   Specimen Description BLOOD  Final   Special Requests NONE  Final   Culture NO GROWTH <12 HOURS  Final   Report Status PENDING  Incomplete   Medical History: Past Medical History  Diagnosis Date  . HIV (human immunodeficiency virus infection) (HCC)   . ESRD (end stage renal disease) (HCC)   . Staphylococcus aureus bacteremia with sepsis (HCC) 09/09/2014  . A-V fistula (HCC)     Placed on 10/27/2014 at [facility]; for future HD use.   . Systolic dysfunction, left ventricle 12/20/2014    EF 20%   . Asthma    Medications:  Prescriptions prior to admission  Medication Sig Dispense Refill Last Dose  . albuterol (PROVENTIL HFA;VENTOLIN HFA) 108 (90 BASE) MCG/ACT inhaler Inhale 1-2 puffs into the lungs every 6 (six) hours as needed for wheezing or shortness of breath.   11/22/2015 at Unknown time  . emtricitabine (EMTRIVA) 200 MG capsule Take 200 mg by mouth 2 (two) times a week. Taken after dialysis on Tuesday and Thursday   11/23/2015 at 1000a  . etravirine (INTELENCE) 100 MG tablet Take 200 mg by mouth every 12 (twelve) hours.  11/23/2015 at 1000a  . ibuprofen (ADVIL,MOTRIN) 200 MG tablet Take 200 mg by mouth every 6 (six) hours as needed for fever or moderate pain.   unknown  . lisinopril (PRINIVIL,ZESTRIL) 2.5 MG tablet Take 1 tablet (2.5 mg total) by mouth daily. 90 tablet 3 11/23/2015 at Unknown time  . metoprolol succinate (TOPROL-XL) 50 MG 24 hr tablet Take 1 tablet (50 mg total) by mouth 2 (two) times daily. Take with or immediately following a meal. 180 tablet 3 11/23/2015 at 1000a  . raltegravir (ISENTRESS) 400 MG tablet Take 400 mg by mouth 2 (two) times daily.   11/23/2015 at 1000a  . sulfamethoxazole-trimethoprim  (BACTRIM,SEPTRA) 400-80 MG tablet Take 1 tablet by mouth 3 (three) times a week. To take after dialysis days   11/20/2015 at Unknown time  . tenofovir (VIREAD) 300 MG tablet Take 300 mg by mouth once a week. To be taken on Tuesday after dialysis   11/16/2015 at Unknown time   Anti-infectives    Start     Dose/Rate Route Frequency Ordered Stop   11/25/15 0900  emtricitabine (EMTRIVA) capsule 200 mg     200 mg Oral Once per day on Mon Thu 11/23/15 1627     11/24/15 1530  vancomycin (VANCOCIN) 500 mg in sodium chloride 0.9 % 100 mL IVPB     500 mg 100 mL/hr over 60 Minutes Intravenous Every M-W-F (Hemodialysis) 11/24/15 1155     11/24/15 0900  sulfamethoxazole-trimethoprim (BACTRIM DS,SEPTRA DS) 800-160 MG per tablet 0.5 tablet     0.5 tablet Oral Once per day on Mon Wed Fri 11/23/15 1627     11/23/15 2200  etravirine (INTELENCE) tablet 200 mg     200 mg Oral Every 12 hours 11/23/15 1627     11/23/15 2200  raltegravir (ISENTRESS) tablet 400 mg     400 mg Oral 2 times daily 11/23/15 1627     11/23/15 2200  piperacillin-tazobactam (ZOSYN) IVPB 2.25 g  Status:  Discontinued     2.25 g 100 mL/hr over 30 Minutes Intravenous Every 8 hours 11/23/15 1633 11/23/15 1651   11/23/15 2200  piperacillin-tazobactam (ZOSYN) 2.25 g in dextrose 5 % 50 mL IVPB     2.25 g 100 mL/hr over 30 Minutes Intravenous 3 times per day 11/23/15 1651     11/23/15 1800  tenofovir (VIREAD) tablet 300 mg     300 mg Oral Weekly 11/23/15 1627     11/23/15 1515  vancomycin (VANCOCIN) 1,500 mg in sodium chloride 0.9 % 500 mL IVPB     1,500 mg 250 mL/hr over 120 Minutes Intravenous  Once 11/23/15 1414 11/23/15 1741   11/23/15 1415  piperacillin-tazobactam (ZOSYN) IVPB 3.375 g     3.375 g 100 mL/hr over 30 Minutes Intravenous STAT 11/23/15 1414 11/23/15 1500     Assessment: 24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  Asked to initiate Vancomycin and Zosyn for sepsis.   Goal of Therapy:  Eradicate  infection.  Plan:  Zosyn 2.25gm IV q8h Vancomycin 500mg  IV after each dialysis session Pre-Hemodialysis Vancomycin level goal range =15-25 mcg/ml Monitor progress, labs, and cultures  Valrie Hart A, RPH 11/24/2015,1:50 PM

## 2015-11-24 NOTE — Progress Notes (Signed)
Positive blood cultures in aerobic and anaerobic bottles. Gram positive cocci in clusters. MD paged with results.

## 2015-11-24 NOTE — Care Management Obs Status (Signed)
MEDICARE OBSERVATION STATUS NOTIFICATION   Patient Details  Name: DECKLEN ZANIN MRN: [id number] Date of Birth: [birth year]   Medicare Observation Status Notification Given:  Yes    Adonis Huguenin, RN 11/24/2015, 9:19 AM

## 2015-11-24 NOTE — Progress Notes (Signed)
[facility] PROGRESS NOTE  Dave Estrada RAX:[id number] DOB: 05/02/1991 DOA: 11/23/2015 PCP: [name] [name], MD  Assessment/Plan: 1. Sepsis due to staph aureus. Patient has 1/2. Sets of blood cultures positive for gpc in clusters, which is likely staph aureus. He does not have any evidence of cellulitis/ back pain. CXR does not show any evidence of pna. Soreness is likely his dialysis catheter, which will need to be removed. Patient will undergo dialysis today, after which we can likely remove his catheter. He is on broad spectrum abx. Will repeat blood cultures. He will likely need a prolonged course of iv abx. IV fluids per sepsis order set.  2. HIV, continue anti-retroviral therapy 3. ESRD, requires dialysis but did not have normal scheduled dialysis due to fever. Nephro has been consulted about dialysis later today. Will remove catheter as soon as possible. Patient has an av fistula, but apparently this is non-functional.  4. 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 No evidence of decompensated CHF at this point. Will need to follow volume status very closely in the setting of hydration due to sepsis. Continue metroprolol and lisinopril. Patient is scheduled for outpatient MRI. 5. Hyperkalemia, related to renal disease, should hopefully correct after dialysis today.    Code Status: Full DVT prophylaxis: SCD's Family Communication: No family at bedside Disposition Plan: Anticipate discharge when improved   Consultants:  Nephrology  Procedures:    Antibiotics:  Vancomycin 2/7 >>  Zosyn 2/7 >>   HPI/Subjective: Patient reports that his fevers started yesterday. Denies knowledge of any sores or infection. Reports that dialysis catheter has been in for approx 3-4 months. Denies missing any medication for HIV. He feels sob and has been coughing. Denies back pain.    Objective: Filed Vitals:   11/23/15 2119 11/24/15 0655  BP: 127/88 97/79  Pulse: 129 106  Temp:  98 F (36.7 C) 97.6 F (36.4 C)  Resp:      Intake/Output Summary (Last 24 hours) at 11/24/15 0746 Last data filed at 11/24/15 0546  Gross per 24 hour  Intake     50 ml  Output      0 ml  Net     50 ml   Filed Weights   11/23/15 1256 11/23/15 1629  Weight: 58.06 kg (128 lb) 58.06 kg (128 lb)    Exam:   General: NAD, looks comfortable  Cardiovascular: RRR, S1, S2   Respiratory: clear bilaterally, No wheezing, rales or rhonchi  Abdomen: soft, non tender, no distention , bowel sounds normal  Musculoskeletal: 1+ edema b/l  Data Reviewed: Basic Metabolic Panel:  Recent Labs Lab 11/23/15 1325  NA 136  K 4.5  CL 100*  CO2 20*  GLUCOSE 107*  BUN 60*  CREATININE 10.61*  CALCIUM 8.8*   Liver Function Tests:  Recent Labs Lab 11/23/15 1325  AST 37  ALT 20  ALKPHOS 68  BILITOT 1.6*  PROT 6.9  ALBUMIN 3.2*   CBC:  Recent Labs Lab 11/23/15 1325  WBC 12.9*  NEUTROABS 11.7*  HGB 12.5*  HCT 37.7*  MCV 112.2*  PLT 181   BNP (last 3 results)  Recent Labs  09/11/15 0150  BNP >4500.0*      Recent Results (from the past 240 hour(s))  Culture, blood (routine x 2)     Status: None (Preliminary result)   Collection Time: 11/23/15  1:29 PM  Result Value Ref Range Status   Specimen Description BLOOD RIGHT ANTECUBITAL  Final   Special Requests  Final    BOTTLES DRAWN AEROBIC AND ANAEROBIC AEB=8CC ANA=6CC   Culture  Setup Time   Final    GRAM POSITIVE COCCI IN CLUSTERS Gram Stain Report Called to,Read Back By and Verified With: THOMAS K AT 0406 ON [id number] BY FORSYTH K    Culture PENDING  Incomplete   Report Status PENDING  Incomplete  MRSA PCR Screening     Status: Abnormal   Collection Time: 11/23/15  6:44 PM  Result Value Ref Range Status   MRSA by PCR POSITIVE (A) NEGATIVE Final    Comment:        The GeneXpert MRSA Assay (FDA approved for NASAL specimens only), is one component of a comprehensive MRSA colonization surveillance program. It  is not intended to diagnose MRSA infection nor to guide or monitor treatment for MRSA infections. RESULT CALLED TO, READ BACK BY AND VERIFIED WITH: Vincent Peyer AT 2208 ON [id number] BY FORSYTH K      Studies: Dg Chest Portable 1 View  11/23/2015  CLINICAL DATA:  Cough and congestion.  Chronic renal failure EXAM: PORTABLE CHEST 1 VIEW COMPARISON:  Chest radiograph and chest CT September 11, 2015 FINDINGS: There is no apparent edema or consolidation. There is generalized cardiac enlargement with pulmonary vascularity within normal limits. No adenopathy. Central catheter tip is in the superior vena cava. No pneumothorax. IMPRESSION: Generalized cardiac enlargement. Cannot exclude underlying pericardial effusion. No edema or consolidation. No pneumothorax. Electronically Signed   By: Bretta Bang III M.D.   On: 11/23/2015 13:49    Scheduled Meds: . Chlorhexidine Gluconate Cloth  6 each Topical Q0600  . [START ON 11/25/2015] emtricitabine  200 mg Oral Once per day on Mon Thu  . etravirine  200 mg Oral Q12H  . guaiFENesin  1,200 mg Oral BID  . ipratropium-albuterol  3 mL Nebulization Q6H  . levETIRAcetam  500 mg Oral BID  . lisinopril  2.5 mg Oral Daily  . metoprolol succinate  50 mg Oral BID  . mupirocin ointment  1 application Nasal BID  . piperacillin-tazobactam (ZOSYN)  IV  2.25 g Intravenous 3 times per day  . raltegravir  400 mg Oral BID  . sulfamethoxazole-trimethoprim  0.5 tablet Oral Once per day on Mon Wed Fri  . tenofovir  300 mg Oral Weekly   Continuous Infusions: . sodium chloride 10 mL/hr at 11/23/15 1834    Active Problems:   HIV (human immunodeficiency virus infection) (HCC)   ESRD on hemodialysis (HCC)   Fever    Time spent: 25 minutes    [name], MD.  [facility] Pager [phone]. If 7PM-7AM, please contact night-coverage at [email or web address], password [facility] 11/24/2015, 7:46 AM      By signing my name below, I, Adron Bene, attest that this  documentation has been prepared under the direction and in the presence of [name], MD. Electronically Signed: Adron Bene   11/24/2015  11:08am  I, Dr. [name], personally performed the services described in this documentaiton. All medical record entries made by the scribe were at my direction and in my presence. I have reviewed the chart and agree that the record reflects my personal performance and is accurate and complete  [name], MD, 11/24/2015 11:24 AM

## 2015-11-25 LAB — CBC
HEMATOCRIT: 39.2 % (ref 39.0–52.0)
HEMOGLOBIN: 13 g/dL (ref 13.0–17.0)
MCH: 37.1 pg — AB (ref 26.0–34.0)
MCHC: 33.2 g/dL (ref 30.0–36.0)
MCV: 112 fL — ABNORMAL HIGH (ref 78.0–100.0)
Platelets: 141 10*3/uL — ABNORMAL LOW (ref 150–400)
RBC: 3.5 MIL/uL — ABNORMAL LOW (ref 4.22–5.81)
RDW: 15.4 % (ref 11.5–15.5)
WBC: 10.3 10*3/uL (ref 4.0–10.5)

## 2015-11-25 LAB — BASIC METABOLIC PANEL
Anion gap: 20 — ABNORMAL HIGH (ref 5–15)
BUN: 45 mg/dL — AB (ref 6–20)
CHLORIDE: 96 mmol/L — AB (ref 101–111)
CO2: 18 mmol/L — AB (ref 22–32)
CREATININE: 7.94 mg/dL — AB (ref 0.61–1.24)
Calcium: 8.4 mg/dL — ABNORMAL LOW (ref 8.9–10.3)
GFR calc Af Amer: 10 mL/min — ABNORMAL LOW (ref >60)
GFR calc non Af Amer: 8 mL/min — ABNORMAL LOW (ref >60)
GLUCOSE: 90 mg/dL (ref 65–99)
Potassium: 5.5 mmol/L — ABNORMAL HIGH (ref 3.5–5.1)
Sodium: 134 mmol/L — ABNORMAL LOW (ref 135–145)

## 2015-11-25 LAB — HEPATITIS B SURFACE ANTIGEN: Hepatitis B Surface Ag: NEGATIVE

## 2015-11-25 LAB — LACTIC ACID, PLASMA
LACTIC ACID, VENOUS: 6.7 mmol/L — AB (ref 0.5–2.0)
Lactic Acid, Venous: 6.8 mmol/L (ref 0.5–2.0)

## 2015-11-25 MED ORDER — CEFAZOLIN SODIUM-DEXTROSE 2-3 GM-% IV SOLR
INTRAVENOUS | Status: AC
  Filled 2015-11-25: qty 50

## 2015-11-25 MED ORDER — CEFAZOLIN SODIUM-DEXTROSE 2-3 GM-% IV SOLR
2.0000 g | Freq: Once | INTRAVENOUS | Status: AC
  Administered 2015-11-25: 2 g via INTRAVENOUS
  Filled 2015-11-25: qty 50

## 2015-11-25 MED ORDER — CEFAZOLIN SODIUM-DEXTROSE 2-3 GM-% IV SOLR
2.0000 g | INTRAVENOUS | Status: DC
  Administered 2015-11-26: 2 g via INTRAVENOUS
  Filled 2015-11-25 (×2): qty 50

## 2015-11-25 NOTE — Progress Notes (Signed)
Pharmacy Antibiotic Note  Dave Estrada is a 25 y.o. male admitted on 11/23/2015 with bacteremia and fever.  Pharmacy has been consulted for Vancomycin and now Ancef dosing.  Zosyn has been stopped.  Plan: Ancef 2gm IV x 1 today, then every HD.  Height: 5\' 5"  (165.1 cm) Weight: 130 lb 15.3 oz (59.4 kg) IBW/kg (Calculated) : 61.5  Temp (24hrs), Avg:98.2 F (36.8 C), Min:97.7 F (36.5 C), Max:98.6 F (37 C)   Recent Labs Lab 11/23/15 1325 11/23/15 1337 11/24/15 0839 11/24/15 1440 11/24/15 1737 11/25/15 0100 11/25/15 0655  WBC 12.9*  --  15.5*  --   --   --  10.3  CREATININE 10.61*  --  11.78*  --   --   --  7.94*  LATICACIDVEN  --  2.55*  --  9.3* 2.7* 6.8* 6.7*    Estimated Creatinine Clearance: 11.8 mL/min (by C-G formula based on Cr of 7.94).    Allergies  Allergen Reactions  . Bee Venom Anaphylaxis  . Other Anaphylaxis    mushrooms  . Aspirin Other (See Comments)    Reaction is unknown    Antimicrobials this admission: Vancomcyin 11/23/15  >>  Zosyn 11/23/15 >>11/25/2015  Ancef 11/25/15 >>  Dose adjustments this admission: Currently n/a  Microbiology results: 2/7 BCx: STAPHYLOCOCCUS AUREUS  2/8 Repeat BCx: pending 2/7 MRSA PCR: (+)  Thank you for allowing pharmacy to be a part of this patient's care.  Mady Gemma 11/25/2015 6:18 PM

## 2015-11-25 NOTE — Progress Notes (Signed)
Patient had 8 beat run of Vtach. Dr. Kerry Hough notified.

## 2015-11-25 NOTE — Progress Notes (Signed)
[facility] PROGRESS NOTE  Dave Estrada KGO:[id number] DOB: Sep 15, 1991 DOA: 11/23/2015 PCP: [name] [name], MD  Assessment/Plan: 1. Sepsis due to staph aureus. Patient has 1/2. Sets of blood cultures positive for staph aureus. Sensitivities currently pending. He does not have any evidence of cellulitis/ back pain. CXR does not show any evidence of pna. Source is likely his dialysis catheter, which will need to be removed. Consulted Gen surgery to remove catheter. He is on broad spectrum abx. Repeat blood cultures in process. He will likely need a prolonged course of IV abx. TTE to evaluate for vegetations 2. HIV, continue anti-retroviral therapy. Last CD-4  From 08/2015 was 230.  3. ESRD, Nephrology consulted.Patient has an av fistula, but apparently this is non-functional. Dialysis per nephrology 4. 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 No evidence of decompensated CHF at this point. Will need to follow volume status very closely in the setting of hydration due to sepsis. Continue metroprolol. Patient is scheduled for outpatient MRI. 5. Hyperkalemia, related to renal disease, decreased today 5.5. Should hopefully correct itself after second round of HD. ACE discontinued   Code Status: Full DVT prophylaxis: SCD's Family Communication: No family at bedside Disposition Plan: Anticipate discharge when improved   Consultants:  Nephrology  Procedures:    Antibiotics:  Vancomycin 2/7 >>  Zosyn 2/7 >> 2/9  Ancef 2/7>>  HPI/Subjective: Feeling better today. Had some nausea and vomiting yesterday. Occasional cough.  Objective: Filed Vitals:   11/24/15 2306 11/25/15 0538  BP: 107/63 90/58  Pulse: 110 102  Temp:  98.6 F (37 C)  Resp:  20    Intake/Output Summary (Last 24 hours) at 11/25/15 0723 Last data filed at 11/24/15 1840  Gross per 24 hour  Intake    120 ml  Output  -1000 ml  Net   1120 ml   Filed Weights   11/23/15 1629 11/24/15 1430 11/25/15  0538  Weight: 58.06 kg (128 lb) 59.1 kg (130 lb 4.7 oz) 59.194 kg (130 lb 8 oz)    Exam:  General: NAD, looks comfortable  Cardiovascular: RRR, S1, S2   Respiratory: clear bilaterally, No wheezing, rales or rhonchi  Abdomen: soft, non tender, no distention , bowel sounds normal  Musculoskeletal: 1+ edema b/l  Data Reviewed: Basic Metabolic Panel:  Recent Labs Lab 11/23/15 1325 11/24/15 0839  NA 136 135  K 4.5 6.0*  CL 100* 98*  CO2 20* 18*  GLUCOSE 107* 89  BUN 60* 72*  CREATININE 10.61* 11.78*  CALCIUM 8.8* 8.9   Liver Function Tests:  Recent Labs Lab 11/23/15 1325 11/24/15 0839  AST 37 62*  ALT 20 34  ALKPHOS 68 65  BILITOT 1.6* 3.9*  PROT 6.9 7.0  ALBUMIN 3.2* 3.3*   CBC:  Recent Labs Lab 11/23/15 1325 11/24/15 0839  WBC 12.9* 15.5*  NEUTROABS 11.7*  --   HGB 12.5* 14.0  HCT 37.7* 42.5  MCV 112.2* 114.6*  PLT 181 147*   BNP (last 3 results)  Recent Labs  09/11/15 0150  BNP >4500.0*      Recent Results (from the past 240 hour(s))  Culture, blood (routine x 2)     Status: None (Preliminary result)   Collection Time: 11/23/15  1:29 PM  Result Value Ref Range Status   Specimen Description BLOOD RIGHT ANTECUBITAL  Final   Special Requests   Final    BOTTLES DRAWN AEROBIC AND ANAEROBIC AEB=8CC ANA=6CC   Culture  Setup Time   Final  GRAM POSITIVE COCCI IN CLUSTERS RECOVERED FROM BOTH BOTTLES Gram Stain Report Called to,Read Back By and Verified With: THOMAS K AT 0406 ON [id number] BY FORSYTH K Performed at [facility]    Culture PENDING  Incomplete   Report Status PENDING  Incomplete  MRSA PCR Screening     Status: Abnormal   Collection Time: 11/23/15  6:44 PM  Result Value Ref Range Status   MRSA by PCR POSITIVE (A) NEGATIVE Final    Comment:        The GeneXpert MRSA Assay (FDA approved for NASAL specimens only), is one component of a comprehensive MRSA colonization surveillance program. It is not intended to diagnose  MRSA infection nor to guide or monitor treatment for MRSA infections. RESULT CALLED TO, READ BACK BY AND VERIFIED WITH: Vincent Peyer AT 2208 ON [id number] BY FORSYTH K   Culture, blood (routine x 2)     Status: None (Preliminary result)   Collection Time: 11/24/15  8:39 AM  Result Value Ref Range Status   Specimen Description BLOOD  Final   Special Requests NONE  Final   Culture NO GROWTH <12 HOURS  Final   Report Status PENDING  Incomplete     Studies: Dg Chest Portable 1 View  11/23/2015  CLINICAL DATA:  Cough and congestion.  Chronic renal failure EXAM: PORTABLE CHEST 1 VIEW COMPARISON:  Chest radiograph and chest CT September 11, 2015 FINDINGS: There is no apparent edema or consolidation. There is generalized cardiac enlargement with pulmonary vascularity within normal limits. No adenopathy. Central catheter tip is in the superior vena cava. No pneumothorax. IMPRESSION: Generalized cardiac enlargement. Cannot exclude underlying pericardial effusion. No edema or consolidation. No pneumothorax. Electronically Signed   By: Bretta Bang III M.D.   On: 11/23/2015 13:49    Scheduled Meds: . Chlorhexidine Gluconate Cloth  6 each Topical Q0600  . emtricitabine  200 mg Oral Once per day on Mon Thu  . etravirine  200 mg Oral Q12H  . guaiFENesin  1,200 mg Oral BID  . ipratropium-albuterol  3 mL Nebulization TID  . [START ON 11/26/2015] lisinopril  2.5 mg Oral Daily  . metoprolol succinate  50 mg Oral BID  . mupirocin ointment  1 application Nasal BID  . piperacillin-tazobactam (ZOSYN)  IV  2.25 g Intravenous 3 times per day  . raltegravir  400 mg Oral BID  . sulfamethoxazole-trimethoprim  0.5 tablet Oral Once per day on Mon Wed Fri  . tenofovir  300 mg Oral Weekly  . vancomycin  500 mg Intravenous Q M,W,F-HD   Continuous Infusions: . sodium chloride 10 mL/hr (11/24/15 2308)    Active Problems:   HIV (human immunodeficiency virus infection) (HCC)   ESRD on hemodialysis (HCC)    Fever    Time spent: 25 minutes    Dave Blinks, MD.  [facility] Pager [phone]. If 7PM-7AM, please contact night-coverage at [email or web address], password [facility] 11/25/2015, 7:23 AM  LOS: 1 day      By signing my name below, I, [name], attest that this documentation has been prepared under the direction and in the presence of Dave Blinks, MD. Electronically signed: [name], Scribe. 11/25/2015 11:55am   I, Dr. Erick Estrada, personally performed the services described in this documentaiton. All medical record entries made by the scribe were at my direction and in my presence. I have reviewed the chart and agree that the record reflects my personal performance and is accurate and complete  Dave Blinks, MD, 11/25/2015 5:47  PM

## 2015-11-25 NOTE — Progress Notes (Signed)
Subjective: Patient says that he is feeling slightly better. He has occasional cough but no sputum production. His appetite is poor but no nausea or vomiting.   Objective: Vital signs in last 24 hours: Temp:  [98.1 F (36.7 C)-98.6 F (37 C)] 98.6 F (37 C) (02/09 0538) Pulse Rate:  [77-119] 102 (02/09 0538) Resp:  [18-22] 20 (02/09 0538) BP: (90-124)/(49-86) 90/58 mmHg (02/09 0538) SpO2:  [94 %-100 %] 96 % (02/09 0947) Weight:  [130 lb 4.7 oz (59.1 kg)-130 lb 8 oz (59.194 kg)] 130 lb 8 oz (59.194 kg) (02/09 0538)  Intake/Output from previous day: 02/08 0701 - 02/09 0700 In: 120 [P.O.:120] Out: -1000  Intake/Output this shift: Total I/O In: 120 [P.O.:120] Out: -    Recent Labs  11/23/15 1325 11/24/15 0839 11/25/15 0655  HGB 12.5* 14.0 13.0    Recent Labs  11/24/15 0839 11/25/15 0655  WBC 15.5* 10.3  RBC 3.71* 3.50*  HCT 42.5 39.2  PLT 147* 141*    Recent Labs  11/24/15 0839 11/25/15 0655  NA 135 134*  K 6.0* 5.5*  CL 98* 96*  CO2 18* 18*  BUN 72* 45*  CREATININE 11.78* 7.94*  GLUCOSE 89 90  CALCIUM 8.9 8.4*    Recent Labs  11/24/15 1440  INR 1.92*    Patient is alert and in no acute distress Chest is clear to auscultation Heart exam regular rate and rhythm no murmur Extremities no edema   Assessment/Plan: Problem #1 staph aureus bacteremia. Presently he is a febrile. Patient is on Zosyn and vancomycin. This has been a recurrent issue. Source of infection at this moment is not clear.  Problem #2 end-stage renal disease: He is status post hemodialysis yesterday. Presently does not have nausea or vomiting. His appetite is poor. Problem #3 hyperkalemia: His potassium has improved is 5.5 after dialysis yesterday. Problem #4 anemia: His hemoglobin is above our target goal. Patient is off on Epogen. Problem #5. Metabolic bone disease: Calcium is range Problem #6 fluid management: Patient does not have any significant sign of fluid overload. Problem  #7 HIV positive Plan: 1] We'll dialyze patient today and advised him to decrease his potassium intake. 2] we'll check his basic metabolic panel in the morning.   Javyn Havlin S 11/25/2015, 10:06 AM

## 2015-11-25 NOTE — Procedures (Signed)
   HEMODIALYSIS TREATMENT NOTE:  3.5 hour heparin-free dialysis completed via right chest wall PC. Exit site unremarkable. Goal met: 2 liters removed without interruption in ultrafiltration. Hemodynamically stable throughout session. All blood was returned. Zosyn given at end of HD. Report given to primary nursing team (Christina Danelle Earthly and Slater Dildy).  Rockwell Alexandria, RN, CDN

## 2015-11-25 NOTE — Plan of Care (Signed)
Problem: Education: Goal: Knowledge of  General Education information/materials will improve Outcome: Progressing Educate pt. Included in education was the importance of lactic acid levels being within normal range.   Problem: Pain Managment: Goal: General experience of comfort will improve Outcome: Progressing Pt states he has chronic pain. Rated his pain an 8, given pain medication and has been asleep since the administration of pain medicine.   Problem: Physical Regulation: Goal: Ability to maintain clinical measurements within normal limits will improve Outcome: Progressing Pt's pulse rate was elevated. All other labs within normal range.   Problem: Tissue Perfusion: Goal: Risk factors for ineffective tissue perfusion will decrease Outcome: Not Progressing Pt refuses SCDs.  Problem: Fluid Volume: Goal: Ability to maintain a balanced intake and output will improve Outcome: Not Progressing Pt had is a dialysis pt, but had to receive boluses of fluids due to elevated lactic acid. MD aware and closely monitoring pt. Will continue to monitor pt.

## 2015-11-25 NOTE — Progress Notes (Signed)
CRITICAL VALUE ALERT  Critical value received:  Lactic Acid 6.8  Date of notification:  11/25/2015  Time of notification:  01:48  Critical value read back: yes  Nurse who received alert:  Felecia Jan  MD notified (1st page):  Dr. Sharl Ma  Time of first page:  01:48  MD notified (2nd page):  Time of second page:  Responding MD:  Dr. Sharl Ma  Time MD responded:  01:56

## 2015-11-25 NOTE — Progress Notes (Signed)
MD responded to critical lactic acid. States lab should repeat lactic acid at 05:00. No additional orders given. MD states pt will have dialysis tomorrow. Will continue to monitor pt.

## 2015-11-25 NOTE — Progress Notes (Signed)
Lactic acid 6.7. Dr. Kerry Hough notified.

## 2015-11-26 DIAGNOSIS — B9561 Methicillin susceptible Staphylococcus aureus infection as the cause of diseases classified elsewhere: Secondary | ICD-10-CM

## 2015-11-26 LAB — CBC
HEMATOCRIT: 34.8 % — AB (ref 39.0–52.0)
Hemoglobin: 11.7 g/dL — ABNORMAL LOW (ref 13.0–17.0)
MCH: 36.8 pg — AB (ref 26.0–34.0)
MCHC: 33.6 g/dL (ref 30.0–36.0)
MCV: 109.4 fL — AB (ref 78.0–100.0)
PLATELETS: 129 10*3/uL — AB (ref 150–400)
RBC: 3.18 MIL/uL — ABNORMAL LOW (ref 4.22–5.81)
RDW: 15.1 % (ref 11.5–15.5)
WBC: 7.1 10*3/uL (ref 4.0–10.5)

## 2015-11-26 LAB — BASIC METABOLIC PANEL
ANION GAP: 12 (ref 5–15)
BUN: 34 mg/dL — AB (ref 6–20)
CALCIUM: 7.8 mg/dL — AB (ref 8.9–10.3)
CO2: 25 mmol/L (ref 22–32)
Chloride: 99 mmol/L — ABNORMAL LOW (ref 101–111)
Creatinine, Ser: 6.07 mg/dL — ABNORMAL HIGH (ref 0.61–1.24)
GFR calc Af Amer: 13 mL/min — ABNORMAL LOW (ref >60)
GFR calc non Af Amer: 12 mL/min — ABNORMAL LOW (ref >60)
GLUCOSE: 119 mg/dL — AB (ref 65–99)
Potassium: 3.7 mmol/L (ref 3.5–5.1)
Sodium: 136 mmol/L (ref 135–145)

## 2015-11-26 LAB — CULTURE, BLOOD (ROUTINE X 2)

## 2015-11-26 MED ORDER — ALUM & MAG HYDROXIDE-SIMETH 200-200-20 MG/5ML PO SUSP: 15.0000 mL | ORAL | Status: DC | PRN

## 2015-11-26 MED ORDER — CAMPHOR-MENTHOL 0.5-0.5 % EX LOTN
TOPICAL_LOTION | CUTANEOUS | Status: DC | PRN
  Filled 2015-11-26: qty 222

## 2015-11-26 MED ORDER — PANTOPRAZOLE SODIUM 40 MG PO TBEC
40.0000 mg | DELAYED_RELEASE_TABLET | Freq: Every day | ORAL | Status: DC
  Administered 2015-11-26 – 2015-12-02 (×6): 40 mg via ORAL
  Filled 2015-11-26 (×6): qty 1

## 2015-11-26 NOTE — Progress Notes (Addendum)
Subjective: Patient offers no complaints. His cough is better. He denies any nausea or vomiting.  Objective: Vital signs in last 24 hours: Temp:  [97.3 F (36.3 C)-98.2 F (36.8 C)] 97.5 F (36.4 C) (02/10 0521) Pulse Rate:  [77-106] 99 (02/10 0521) Resp:  [18-20] 18 (02/10 0521) BP: (100-125)/(56-82) 105/78 mmHg (02/10 0521) SpO2:  [94 %-100 %] 95 % (02/10 0521) Weight:  [126 lb 3.2 oz (57.244 kg)-130 lb 15.3 oz (59.4 kg)] 126 lb 3.2 oz (57.244 kg) (02/10 0521)  Intake/Output from previous day: 02/09 0701 - 02/10 0700 In: 440 [P.O.:240; IV Piggyback:200] Out: 2153  Intake/Output this shift:     Recent Labs  11/23/15 1325 11/24/15 0839 11/25/15 0655 11/26/15 0651  HGB 12.5* 14.0 13.0 11.7*    Recent Labs  11/25/15 0655 11/26/15 0651  WBC 10.3 7.1  RBC 3.50* 3.18*  HCT 39.2 34.8*  PLT 141* 129*    Recent Labs  11/25/15 0655 11/26/15 0651  NA 134* 136  K 5.5* 3.7  CL 96* 99*  CO2 18* 25  BUN 45* 34*  CREATININE 7.94* 6.07*  GLUCOSE 90 119*  CALCIUM 8.4* 7.8*    Recent Labs  11/24/15 1440  INR 1.92*    Patient is alert and in no acute distress Chest is clear to auscultation Heart exam regular rate and rhythm no murmur Extremities no edema   Assessment/Plan: Problem #1 staph aureus bacteremia. Presently he is a febrile. Patient is on Zosyn and vancomycin. Presently patient is a febrile. Source of infection at this moment is no clear however possibility of induced infection his dialysis catheter was removed today. Patient had similar episode from before. Problem #2 end-stage renal disease: Patient is status post hemodialysis the last 2 days. Presently he doesn't have any uremic signs and symptoms. Problem #3 hyperkalemia: His potassium is normal for today. Problem #4 anemia: His hemoglobin is above our target goal. Patient is off  Epogen. Problem #5. Metabolic bone disease: Calcium is range. Patient presently is not on any binder. Problem #6 fluid  management: Patient does not have any significant sign of fluid overload. Problem #7 HIV positive Plan: 1] patient does not require dialysis today. 2] possibility we'll send patient for for tunneled catheter placement after 48 hours. If urgent dialysis is needed we'll go with femoral catheter. 3] we'll check his basic metabolic panel and phosphorus  in the morning.   Raychel Dowler S 11/26/2015, 9:03 AM

## 2015-11-26 NOTE — Progress Notes (Signed)
0900 approx this am assisted with Dr. Lovell Sheehan with the removal of the patients perm a cath.  After MD d/c'd the line I held pressure to the site for 10 mins and then added a pressure dressing to the site.  Patient tolerated the procedure with minimal difficulties.     1050 Patient expresses that he would like to go off the floor to smoke.  I voiced to him that he would not be able to go off the floor to smoke.  Verbalized to him that his lung sounds were not good and that he did not need to smoke.  He verbalized understanding.

## 2015-11-26 NOTE — Consult Note (Signed)
Asked to see patient concerning need for removal of permacath. Permacath was removed without difficulty at bedside using traction. The tip was sent off for culture. Pressure is being held. Patient tolerated the procedure well.

## 2015-11-26 NOTE — Care Management Important Message (Signed)
Important Message  Patient Details  Name: Dave Estrada MRN: [id number] Date of Birth: 10-10-1991   Medicare Important Message Given:  Yes    Adonis Huguenin, RN 11/26/2015, 10:04 AM

## 2015-11-26 NOTE — Progress Notes (Signed)
[facility] PROGRESS NOTE  Dave Estrada:[id number] DOB: 26-Mar-1991 DOA: 11/23/2015 PCP: [name] [name], MD  Assessment/Plan: 1. Sepsis due to staph aureus. Patient had 1 positive set of blood cultures for staph aureus on 2/7. Repeat blood cultures on 2/8 have shown no growth.  Sensitivities currently pending. He does not have any evidence of cellulitis/ back pain. CXR does not show any evidence of pna. Source is likely his dialysis catheter which was removed earlier today by general surgery. Catheter has been sent for cultures. He is on broad spectrum abx. He is hemodynamically stable and afebrile. He will likely need a prolonged course of IV abx for 4-6 weeks. TTE to evaluate for vegetations.  2. HIV, continue anti-retroviral therapy. Last CD-4 From 08/2015 was 230.  3. ESRD, Nephrology consulted. Patient has an AV fistula, but apparently this is non-functional. Dialysis completed without complication 4. 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 No evidence of decompensated CHF at this point. Will need to follow volume status very closely in the setting of hydration due to sepsis. [place] is scheduled for later today. Continue metroprolol. Patient is scheduled for outpatient MRI. 5. Hyperkalemia, related to renal disease, appears resolved. ACE discontinued  Code Status: Full DVT prophylaxis: SCD's Family Communication: Family at bedside Disposition Plan: Anticipate discharge when improved   Consultants:  Nephrology  General surgery  Procedures:  Hemo-Dialysis, 2 liters removed on 2/09  Antibiotics:  Vancomycin 2/7 >>  Zosyn 2/7 >> 2/9  Ancef 2/7>>  HPI/Subjective: Patient feels better. Breathing has been doing better, but is still coughing a little bit.   Objective: Filed Vitals:   11/25/15 2219 11/26/15 0521  BP: 110/71 105/78  Pulse: 106 99  Temp: 97.3 F (36.3 C) 97.5 F (36.4 C)  Resp: 18 18    Intake/Output Summary (Last 24 hours) at 11/26/15  0814 Last data filed at 11/25/15 1838  Gross per 24 hour  Intake    440 ml  Output   2153 ml  Net  -1713 ml   Filed Weights   11/25/15 0538 11/25/15 1140 11/26/15 0521  Weight: 59.194 kg (130 lb 8 oz) 59.4 kg (130 lb 15.3 oz) 57.244 kg (126 lb 3.2 oz)    Exam:  General: NAD, looks comfortable Cardiovascular: RRR, S1, S2  Respiratory:  Diminished breat sounds at bases  Abdomen: soft, non tender, no distention , bowel sounds normal Musculoskeletal: No edema b/l   Data Reviewed: Basic Metabolic Panel:  Recent Labs Lab 11/23/15 1325 11/24/15 0839 11/25/15 0655 11/26/15 0651  NA 136 135 134* 136  K 4.5 6.0* 5.5* 3.7  CL 100* 98* 96* 99*  CO2 20* 18* 18* 25  GLUCOSE 107* 89 90 119*  BUN 60* 72* 45* 34*  CREATININE 10.61* 11.78* 7.94* 6.07*  CALCIUM 8.8* 8.9 8.4* 7.8*   Liver Function Tests:  Recent Labs Lab 11/23/15 1325 11/24/15 0839  AST 37 62*  ALT 20 34  ALKPHOS 68 65  BILITOT 1.6* 3.9*  PROT 6.9 7.0  ALBUMIN 3.2* 3.3*   CBC:  Recent Labs Lab 11/23/15 1325 11/24/15 0839 11/25/15 0655 11/26/15 0651  WBC 12.9* 15.5* 10.3 7.1  NEUTROABS 11.7*  --   --   --   HGB 12.5* 14.0 13.0 11.7*  HCT 37.7* 42.5 39.2 34.8*  MCV 112.2* 114.6* 112.0* 109.4*  PLT 181 147* 141* 129*   BNP (last 3 results)  Recent Labs  09/11/15 0150  BNP >4500.0*      Recent Results (from the  past 240 hour(s))  Culture, blood (routine x 2)     Status: None (Preliminary result)   Collection Time: 11/23/15  1:29 PM  Result Value Ref Range Status   Specimen Description BLOOD RIGHT ANTECUBITAL  Final   Special Requests   Final    BOTTLES DRAWN AEROBIC AND ANAEROBIC AEB=8CC ANA=6CC   Culture  Setup Time   Final    GRAM POSITIVE COCCI IN CLUSTERS RECOVERED FROM BOTH BOTTLES Gram Stain Report Called to,Read Back By and Verified With: THOMAS K AT 0406 ON [id number] BY FORSYTH K Performed at [facility]    Culture   Final    STAPHYLOCOCCUS AUREUS Performed at [facility]    Report Status PENDING  Incomplete  MRSA PCR Screening     Status: Abnormal   Collection Time: 11/23/15  6:44 PM  Result Value Ref Range Status   MRSA by PCR POSITIVE (A) NEGATIVE Final    Comment:        The GeneXpert MRSA Assay (FDA approved for NASAL specimens only), is one component of a comprehensive MRSA colonization surveillance program. It is not intended to diagnose MRSA infection nor to guide or monitor treatment for MRSA infections. RESULT CALLED TO, READ BACK BY AND VERIFIED WITH: Vincent Peyer AT 2208 ON [id number] BY FORSYTH K   Culture, blood (routine x 2)     Status: None (Preliminary result)   Collection Time: 11/24/15  8:39 AM  Result Value Ref Range Status   Specimen Description RIGHT ANTECUBITAL  Final   Special Requests BOTTLES DRAWN AEROBIC ONLY 6CC  Final   Culture NO GROWTH 2 DAYS  Final   Report Status PENDING  Incomplete  Culture, blood (x 2)     Status: None (Preliminary result)   Collection Time: 11/24/15  2:40 PM  Result Value Ref Range Status   Specimen Description BLOOD HEMODIALYSIS CATHETER VENOUS PORT  Final   Special Requests   Final    BOTTLES DRAWN AEROBIC AND ANAEROBIC AEB=10CC ANA=8CC   Culture NO GROWTH 2 DAYS  Final   Report Status PENDING  Incomplete  Culture, blood (x 2)     Status: None (Preliminary result)   Collection Time: 11/24/15  3:30 PM  Result Value Ref Range Status   Specimen Description BLOOD HD ART PORT DRAWN BY RN  Final   Special Requests   Final    BOTTLES DRAWN AEROBIC AND ANAEROBIC AEB=9CC ANA=6CC   Culture NO GROWTH 2 DAYS  Final   Report Status PENDING  Incomplete     Studies:  Scheduled Meds: .  ceFAZolin (ANCEF) IV  2 g Intravenous Q M,W,F-HD  . Chlorhexidine Gluconate Cloth  6 each Topical Q0600  . emtricitabine  200 mg Oral Once per day on Mon Thu  . etravirine  200 mg Oral Q12H  . guaiFENesin  1,200 mg Oral BID  . ipratropium-albuterol  3 mL Nebulization TID  . metoprolol succinate  50 mg  Oral BID  . mupirocin ointment  1 application Nasal BID  . raltegravir  400 mg Oral BID  . sulfamethoxazole-trimethoprim  0.5 tablet Oral Once per day on Mon Wed Fri  . tenofovir  300 mg Oral Weekly  . vancomycin  500 mg Intravenous Q M,W,F-HD   Continuous Infusions: . sodium chloride 10 mL/hr (11/24/15 2308)    Active Problems:   HIV (human immunodeficiency virus infection) (HCC)   ESRD on hemodialysis (HCC)   Fever    Time spent: 25 minutes  [name], MD.  [facility] Pager [phone]. If 7PM-7AM, please contact night-coverage at [email or web address], password [facility] 11/26/2015, 8:14 AM  LOS: 2 days     By signing my name below, I, Adron Bene, attest that this documentation has been prepared under the direction and in the presence of [name], MD. Electronically Signed: Adron Bene   11/26/2015 10:40am  I, Dr. [name], personally performed the services described in this documentaiton. All medical record entries made by the scribe were at my direction and in my presence. I have reviewed the chart and agree that the record reflects my personal performance and is accurate and complete  [name], MD, 11/26/2015 10:56 AM

## 2015-11-27 ENCOUNTER — Inpatient Hospital Stay (HOSPITAL_COMMUNITY): Payer: Medicare Other

## 2015-11-27 DIAGNOSIS — I428 Other cardiomyopathies: Secondary | ICD-10-CM

## 2015-11-27 DIAGNOSIS — I339 Acute and subacute endocarditis, unspecified: Secondary | ICD-10-CM

## 2015-11-27 LAB — BASIC METABOLIC PANEL
Anion gap: 14 (ref 5–15)
BUN: 45 mg/dL — AB (ref 6–20)
CALCIUM: 7.8 mg/dL — AB (ref 8.9–10.3)
CHLORIDE: 98 mmol/L — AB (ref 101–111)
CO2: 21 mmol/L — ABNORMAL LOW (ref 22–32)
CREATININE: 7.4 mg/dL — AB (ref 0.61–1.24)
GFR calc Af Amer: 11 mL/min — ABNORMAL LOW (ref >60)
GFR calc non Af Amer: 9 mL/min — ABNORMAL LOW (ref >60)
Glucose, Bld: 101 mg/dL — ABNORMAL HIGH (ref 65–99)
Potassium: 4.1 mmol/L (ref 3.5–5.1)
SODIUM: 133 mmol/L — AB (ref 135–145)

## 2015-11-27 LAB — PHOSPHORUS: Phosphorus: 4.3 mg/dL (ref 2.5–4.6)

## 2015-11-27 NOTE — Progress Notes (Signed)
Subjective: Patient continued to feel better. He offers no complaints today.  Objective: Vital signs in last 24 hours: Temp:  [97.2 F (36.2 C)-97.5 F (36.4 C)] 97.2 F (36.2 C) (02/11 0729) Pulse Rate:  [92-104] 92 (02/11 0729) Resp:  [18] 18 (02/11 0729) BP: (96-113)/(67-77) 96/67 mmHg (02/11 0729) SpO2:  [94 %-99 %] 98 % (02/11 0729) Weight:  [126 lb 11.2 oz (57.471 kg)] 126 lb 11.2 oz (57.471 kg) (02/11 0729)  Intake/Output from previous day: 02/10 0701 - 02/11 0700 In: 720 [P.O.:720] Out: -  Intake/Output this shift:     Recent Labs  11/25/15 0655 11/26/15 0651  HGB 13.0 11.7*    Recent Labs  11/25/15 0655 11/26/15 0651  WBC 10.3 7.1  RBC 3.50* 3.18*  HCT 39.2 34.8*  PLT 141* 129*    Recent Labs  11/26/15 0651 11/27/15 0657  NA 136 133*  K 3.7 4.1  CL 99* 98*  CO2 25 21*  BUN 34* 45*  CREATININE 6.07* 7.40*  GLUCOSE 119* 101*  CALCIUM 7.8* 7.8*    Recent Labs  11/24/15 1440  INR 1.92*    Patient is alert and in no acute distress Chest is clear to auscultation Heart exam regular rate and rhythm no murmur Extremities no edema   Assessment/Plan: Problem #1 staph aureus bacteremia. MRSA. His catheter was removed yesterday. Presently is a febrile and his white blood cell count is normal. Problem #2 end-stage renal disease: Patient is status post hemodialysis on Thursday. Patient doesn't have any uremic signs and symptoms. Problem #3 hyperkalemia: His potassium is normal for today. Problem #4 anemia: His hemoglobin is above our target goal. Patient is off  Epogen. Problem #5. Metabolic bone disease: Calcium and phosphorus is 0 range. Problem #6 fluid management: Patient does not have any significant sign of fluid overload. Problem #7 HIV positive Plan: 1] patient does not require dialysis today. 2] we'll check his basic metabolic panel in the morning. If his potassium remains stable and no sign of fluid overload then will send patient for new  tunneled catheter placement on Monday. 3] we'll check his basic metabolic panel and phosphorus  in the morning.   Sharilynn Cassity S 11/27/2015, 9:29 AM

## 2015-11-27 NOTE — Progress Notes (Signed)
Echocardiogram 2D Echocardiogram has been performed.  Dave Estrada 11/27/2015, 8:36 AM

## 2015-11-27 NOTE — Progress Notes (Signed)
[facility] PROGRESS NOTE  Dave Estrada WUJ:[id number] DOB: [birth year] DOA: 11/23/2015 PCP: [name] [name], MD  Assessment/Plan: 1. Sepsis due to MRSA bacteremia. Patient had 1/2 sets of blood cultures positive for MRSA on 2/7. Repeat blood cultures on 2/8 have shown no growth.  Sensitivities reveal it is MRSA bacteremia. Will continue Vancomycin. Ancef has been discontinued. He does not have any evidence of cellulitis/ back pain. CXR does not show any evidence of pna. Source is likely his dialysis catheter which was removed on 2/10 by general surgery. Catheter tip has been sent for cultures. He is hemodynamically stable and afebrile. He will likely need a prolonged course of IV abx for 4-6 weeks. TTE to evaluate for vegetations.  2. HIV, continue anti-retroviral therapy. Last CD-4 from 08/2015 was 230.  3. ESRD, Nephrology following. Patient has an AV fistula, but apparently this is non-functional. Dialysis completed without complication on 2/09. If hemodynamics and potassium levels stay stable, will plan on new tunneled catheter to be placed on 1/13.  4. 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 No evidence of decompensated CHF at this point. Will need to follow volume status very closely in the setting of hydration due to sepsis. ECHO results pending. Will continue metroprolol. Patient is scheduled for outpatient cardiac MRI. 5. Hyperkalemia, related to renal disease, appears resolved. ACE discontinued  Code Status: Full DVT prophylaxis: SCD's Family Communication: Family at bedside Disposition Plan: Anticipate discharge when improved   Consultants:  Nephrology  General surgery  Procedures:  Hemo-Dialysis, 2 liters removed on 2/09  Antibiotics:  Vancomycin 2/7 >>  Zosyn 2/7 >> 2/9  Ancef 2/7>>2/10  HPI/Subjective: States he feels like he cannot catch his breath. Denies any CP.  Objective: Filed Vitals:   11/26/15 2300 11/27/15 0729  BP: 97/77 96/67   Pulse: 98 92  Temp: 97.4 F (36.3 C) 97.2 F (36.2 C)  Resp: 18 18    Intake/Output Summary (Last 24 hours) at 11/27/15 0759 Last data filed at 11/26/15 1730  Gross per 24 hour  Intake    720 ml  Output      0 ml  Net    720 ml   Filed Weights   11/25/15 1140 11/26/15 0521 11/27/15 0729  Weight: 59.4 kg (130 lb 15.3 oz) 57.244 kg (126 lb 3.2 oz) 57.471 kg (126 lb 11.2 oz)    Exam:  General: NAD, looks comfortable Cardiovascular: RRR, S1, S2  Respiratory: clear bilaterally, No wheezing, rales or rhonchi Abdomen: soft, non tender, no distention , bowel sounds normal Musculoskeletal: No edema b/l   Data Reviewed: Basic Metabolic Panel:  Recent Labs Lab 11/23/15 1325 11/24/15 0839 11/25/15 0655 11/26/15 0651 11/27/15 0657  NA 136 135 134* 136 133*  K 4.5 6.0* 5.5* 3.7 4.1  CL 100* 98* 96* 99* 98*  CO2 20* 18* 18* 25 21*  GLUCOSE 107* 89 90 119* 101*  BUN 60* 72* 45* 34* 45*  CREATININE 10.61* 11.78* 7.94* 6.07* 7.40*  CALCIUM 8.8* 8.9 8.4* 7.8* 7.8*   Liver Function Tests:  Recent Labs Lab 11/23/15 1325 11/24/15 0839  AST 37 62*  ALT 20 34  ALKPHOS 68 65  BILITOT 1.6* 3.9*  PROT 6.9 7.0  ALBUMIN 3.2* 3.3*   CBC:  Recent Labs Lab 11/23/15 1325 11/24/15 0839 11/25/15 0655 11/26/15 0651  WBC 12.9* 15.5* 10.3 7.1  NEUTROABS 11.7*  --   --   --   HGB 12.5* 14.0 13.0 11.7*  HCT 37.7* 42.5 39.2 34.8*  MCV 112.2* 114.6* 112.0* 109.4*  PLT 181 147* 141* 129*   BNP (last 3 results)  Recent Labs  09/11/15 0150  BNP >4500.0*      Recent Results (from the past 240 hour(s))  Culture, blood (routine x 2)     Status: None   Collection Time: 11/23/15  1:29 PM  Result Value Ref Range Status   Specimen Description BLOOD RIGHT ANTECUBITAL  Final   Special Requests   Final    BOTTLES DRAWN AEROBIC AND ANAEROBIC AEB=8CC ANA=6CC   Culture  Setup Time   Final    GRAM POSITIVE COCCI IN CLUSTERS RECOVERED FROM BOTH BOTTLES Gram Stain Report Called  to,Read Back By and Verified With: THOMAS K AT 0406 ON [id number] BY FORSYTH K Performed at [facility]    Culture   Final    METHICILLIN RESISTANT STAPHYLOCOCCUS AUREUS Performed at [facility]    Report Status 11/26/2015 FINAL  Final   Organism ID, Bacteria METHICILLIN RESISTANT STAPHYLOCOCCUS AUREUS  Final      Susceptibility   Methicillin resistant staphylococcus aureus - MIC*    CIPROFLOXACIN >=8 RESISTANT Resistant     ERYTHROMYCIN >=8 RESISTANT Resistant     GENTAMICIN <=0.5 SENSITIVE Sensitive     OXACILLIN >=4 RESISTANT Resistant     TETRACYCLINE <=1 SENSITIVE Sensitive     VANCOMYCIN <=0.5 SENSITIVE Sensitive     TRIMETH/SULFA <=10 SENSITIVE Sensitive     CLINDAMYCIN <=0.25 SENSITIVE Sensitive     RIFAMPIN <=0.5 SENSITIVE Sensitive     Inducible Clindamycin NEGATIVE Sensitive     * METHICILLIN RESISTANT STAPHYLOCOCCUS AUREUS  MRSA PCR Screening     Status: Abnormal   Collection Time: 11/23/15  6:44 PM  Result Value Ref Range Status   MRSA by PCR POSITIVE (A) NEGATIVE Final    Comment:        The GeneXpert MRSA Assay (FDA approved for NASAL specimens only), is one component of a comprehensive MRSA colonization surveillance program. It is not intended to diagnose MRSA infection nor to guide or monitor treatment for MRSA infections. RESULT CALLED TO, READ BACK BY AND VERIFIED WITH: Vincent Peyer AT 2208 ON [id number] BY FORSYTH K   Culture, blood (routine x 2)     Status: None (Preliminary result)   Collection Time: 11/24/15  8:39 AM  Result Value Ref Range Status   Specimen Description RIGHT ANTECUBITAL  Final   Special Requests BOTTLES DRAWN AEROBIC ONLY 6CC  Final   Culture NO GROWTH 2 DAYS  Final   Report Status PENDING  Incomplete  Culture, blood (x 2)     Status: None (Preliminary result)   Collection Time: 11/24/15  2:40 PM  Result Value Ref Range Status   Specimen Description BLOOD HEMODIALYSIS CATHETER VENOUS PORT  Final   Special Requests   Final     BOTTLES DRAWN AEROBIC AND ANAEROBIC AEB=10CC ANA=8CC   Culture NO GROWTH 2 DAYS  Final   Report Status PENDING  Incomplete  Culture, blood (x 2)     Status: None (Preliminary result)   Collection Time: 11/24/15  3:30 PM  Result Value Ref Range Status   Specimen Description BLOOD HD ART PORT DRAWN BY RN  Final   Special Requests   Final    BOTTLES DRAWN AEROBIC AND ANAEROBIC AEB=9CC ANA=6CC   Culture NO GROWTH 2 DAYS  Final   Report Status PENDING  Incomplete     Studies:  Scheduled Meds: . Chlorhexidine Gluconate Cloth  6 each  Topical Q0600  . emtricitabine  200 mg Oral Once per day on Mon Thu  . etravirine  200 mg Oral Q12H  . guaiFENesin  1,200 mg Oral BID  . ipratropium-albuterol  3 mL Nebulization TID  . metoprolol succinate  50 mg Oral BID  . mupirocin ointment  1 application Nasal BID  . pantoprazole  40 mg Oral Daily  . raltegravir  400 mg Oral BID  . sulfamethoxazole-trimethoprim  0.5 tablet Oral Once per day on Mon Wed Fri  . tenofovir  300 mg Oral Weekly  . vancomycin  500 mg Intravenous Q M,W,F-HD   Continuous Infusions: . sodium chloride 10 mL/hr (11/24/15 2308)    Active Problems:   HIV (human immunodeficiency virus infection) (HCC)   ESRD on hemodialysis (HCC)   Fever    Time spent: 25 minutes   [name], MD.  [facility] Pager [phone]. If 7PM-7AM, please contact night-coverage at [email or web address], password [facility] 11/27/2015, 7:59 AM  LOS: 3 days     By signing my name below, I, Burnett Harry, attest that this documentation has been prepared under the direction and in the presence of [facility]. MD Electronically Signed: Burnett Harry, Scribe. 11/27/2015   I, Dr. [name], personally performed the services described in this documentaiton. All medical record entries made by the scribe were at my direction and in my presence. I have reviewed the chart and agree that the record reflects my personal performance and is  accurate and complete  [name], MD, 11/27/2015 2:59 PM

## 2015-11-28 ENCOUNTER — Inpatient Hospital Stay (HOSPITAL_COMMUNITY): Payer: Medicare Other

## 2015-11-28 LAB — BASIC METABOLIC PANEL
ANION GAP: 21 — AB (ref 5–15)
BUN: 61 mg/dL — ABNORMAL HIGH (ref 6–20)
CALCIUM: 8.1 mg/dL — AB (ref 8.9–10.3)
CO2: 17 mmol/L — AB (ref 22–32)
Chloride: 95 mmol/L — ABNORMAL LOW (ref 101–111)
Creatinine, Ser: 9.41 mg/dL — ABNORMAL HIGH (ref 0.61–1.24)
GFR calc non Af Amer: 7 mL/min — ABNORMAL LOW (ref >60)
GFR, EST AFRICAN AMERICAN: 8 mL/min — AB (ref >60)
GLUCOSE: 136 mg/dL — AB (ref 65–99)
POTASSIUM: 5 mmol/L (ref 3.5–5.1)
Sodium: 133 mmol/L — ABNORMAL LOW (ref 135–145)

## 2015-11-28 LAB — GLUCOSE, CAPILLARY
GLUCOSE-CAPILLARY: 72 mg/dL (ref 65–99)
GLUCOSE-CAPILLARY: 86 mg/dL (ref 65–99)
Glucose-Capillary: 44 mg/dL — CL (ref 65–99)
Glucose-Capillary: 129 mg/dL — ABNORMAL HIGH (ref 65–99)
Glucose-Capillary: 135 mg/dL — ABNORMAL HIGH (ref 65–99)
Glucose-Capillary: 45 mg/dL — ABNORMAL LOW (ref 65–99)
Glucose-Capillary: 94 mg/dL (ref 65–99)

## 2015-11-28 LAB — CORTISOL: Cortisol, Plasma: 32.1 ug/dL

## 2015-11-28 MED ORDER — POLYETHYLENE GLYCOL 3350 17 G PO PACK
17.0000 g | PACK | Freq: Every day | ORAL | Status: DC
  Administered 2015-11-30 – 2015-12-01 (×2): 17 g via ORAL
  Filled 2015-11-28 (×2): qty 1

## 2015-11-28 MED ORDER — DEXTROSE 50 % IV SOLN
INTRAVENOUS | Status: AC
  Administered 2015-11-28: 25 mL
  Filled 2015-11-28: qty 50

## 2015-11-28 MED ORDER — MILK AND MOLASSES ENEMA: 1.0000 | Freq: Once | RECTAL | Status: DC

## 2015-11-28 MED ORDER — DEXTROSE 10 % IV SOLN
INTRAVENOUS | Status: DC
  Administered 2015-11-28: 17:00:00 via INTRAVENOUS

## 2015-11-28 MED ORDER — METOCLOPRAMIDE HCL 5 MG/ML IJ SOLN
5.0000 mg | Freq: Four times a day (QID) | INTRAMUSCULAR | Status: DC
  Administered 2015-11-28 – 2015-12-01 (×8): 5 mg via INTRAVENOUS
  Filled 2015-11-28 (×9): qty 2

## 2015-11-28 NOTE — Progress Notes (Signed)
Subjective: Patient denies any difficulty breathing. He has some nausea but no vomiting. Patient has episode of hypoglycemia.  Objective: Vital signs in last 24 hours: Temp:  [98.2 F (36.8 C)] 98.2 F (36.8 C) (02/12 0606) Pulse Rate:  [92-97] 92 (02/12 0606) Resp:  [18] 18 (02/12 0606) BP: (101-102)/(73-81) 101/73 mmHg (02/12 0606) SpO2:  [95 %-99 %] 97 % (02/12 0844) Weight:  [123 lb 14.4 oz (56.201 kg)] 123 lb 14.4 oz (56.201 kg) (02/12 0606)  Intake/Output from previous day: 02/11 0701 - 02/12 0700 In: 720 [P.O.:720] Out: -  Intake/Output this shift:     Recent Labs  11/26/15 0651  HGB 11.7*    Recent Labs  11/26/15 0651  WBC 7.1  RBC 3.18*  HCT 34.8*  PLT 129*    Recent Labs  11/26/15 0651 11/27/15 0657  NA 136 133*  K 3.7 4.1  CL 99* 98*  CO2 25 21*  BUN 34* 45*  CREATININE 6.07* 7.40*  GLUCOSE 119* 101*  CALCIUM 7.8* 7.8*   No results for input(s): LABPT, INR in the last 72 hours.  Patient is alert and in no acute distress Chest is clear to auscultation Heart exam regular rate and rhythm no murmur Extremities no edema   Assessment/Plan: Problem #1 staph aureus bacteremia. Presently he is a febrile. Patient is on Zosyn and vancomycin. Presently patient is a febrile. Presently he is a febrile with normal white blood cell count. His catheter tip culture is pending. Problem #2 end-stage renal disease: Patient is status post hemodialysis on Thursday. Patient denies any nausea or vomiting. Presently his blood work is pending. Problem #3 hyperkalemia: His potassium is normal from yesterday. His basic metabolic panel presently is pending Problem #4 anemia: His hemoglobin is above our target goal. Patient is off  Epogen. Problem #5. Metabolic bone disease: Calcium is range. Patient presently is not on any binder. Problem #6 fluid management: Patient does not have any significant sign of fluid overload. Problem #7 HIV positive Plan: 1] patient does not  require dialysis today. 2] we'll send patient for tunneled catheter placement tomorrow. Patient adamantly doesn't want to have any femoral catheter for any recent. If his potassium is high we may give him some Kayexalate. 3] we'll check his basic metabolic panel  in the morning.   Sharif Rendell S 11/28/2015, 9:07 AM

## 2015-11-28 NOTE — Progress Notes (Signed)
[facility] PROGRESS NOTE  TELLIS DOSSEY NID:[id number] DOB: 05/23/1991 DOA: 11/23/2015 PCP: [name] [name], MD  Assessment/Plan: 1. Sepsis due to MRSA bacteremia. Patient had 1/2 sets of blood cultures positive for MRSA on 2/7. Repeat blood cultures on 2/8 have shown no growth.  Sensitivities reveal it is MRSA bacteremia. Will continue Vancomycin. Ancef has been discontinued. He does not have any evidence of cellulitis/ back pain. CXR does not show any evidence of pna. Source is likely his dialysis catheter which was removed on 2/10 by general surgery. Catheter tip has been sent for cultures. He is hemodynamically stable and afebrile. He will likely need a prolonged course of IV abx for 4-6 weeks. TTE negative for vegetations.  2. HIV, continue anti-retroviral therapy. Last CD-4 from 08/2015 was 230.  3. ESRD, Nephrology following. Patient has an AV fistula, but apparently this is non-functional. Dialysis completed without complication on 2/09. If hemodynamics and potassium levels stay stable, will plan on new tunneled catheter to be placed on 1/13.  4. 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 No evidence of decompensated CHF at this point. Will need to follow volume status very closely in the setting of hydration due to sepsis. ECHO as below. Will continue metroprolol. Patient is scheduled for outpatient cardiac MRI. 5. Hyperkalemia, related to renal disease, appears resolved. ACE discontinued 6. Hypoglycemia, patient had a hypoglycemic event this morning. Likely related to decreased PO intake. Blood sugars are now improved. Patient says he is unable to eat due to persistent nausea and vomiting. Will check abdominal xray, continue antiemetics and give a trial of Reglan.   Code Status: Full DVT prophylaxis: SCD's Family Communication: No family at bedside Disposition Plan: Anticipate discharge when improved   Consultants:  Nephrology  General  surgery  Procedures:  Hemo-Dialysis, 2 liters removed on 2/09  ECHO Study Conclusions  - Left ventricle: The cavity size was moderately dilated. Wall thickness was normal. Systolic function was severely reduced. The estimated ejection fraction was in the range of 20% to 25%. Diffuse hypokinesis. Features are consistent with a pseudonormal left ventricular filling pattern, with concomitant abnormal relaxation and increased filling pressure (grade 2 diastolic dysfunction). Doppler parameters are consistent with high ventricular filling pressure. 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 There was moderate to severe regurgitation. - Left atrium: The atrium was moderately dilated. - Right ventricle: The cavity size was mildly dilated. Systolic function was severely reduced. - Right atrium: The atrium was moderately dilated. - Tricuspid valve: There was moderate regurgitation. - Pulmonary arteries: Systolic pressure was mildly increased. PA peak pressure: 39 mm Hg (S). - Pericardium, extracardiac: A small pericardial effusion was identified.  Antibiotics:  Vancomycin 2/7 >>  Zosyn 2/7 >> 2/9  Ancef 2/7>>2/10  HPI/Subjective: Cant keep anything down because of nausea. Last BM was last night. Breathing is okay.   Objective: Filed Vitals:   11/27/15 1349 11/28/15 0606  BP: 102/81 101/73  Pulse: 97 92  Temp:  98.2 F (36.8 C)  Resp: 18 18    Intake/Output Summary (Last 24 hours) at 11/28/15 0755 Last data filed at 11/27/15 1730  Gross per 24 hour  Intake    720 ml  Output      0 ml  Net    720 ml   Filed Weights   11/26/15 0521 11/27/15 0729 11/28/15 0606  Weight: 57.244 kg (126 lb 3.2 oz) 57.471 kg (126 lb 11.2 oz) 56.201 kg (123 lb 14.4 oz)  Exam:  General: NAD, looks comfortable Cardiovascular: RRR, S1, S2  Respiratory: clear bilaterally, No wheezing, rales or rhonchi Abdomen: soft, mild  tenderness around periumbilical region, no distention , bowel sounds sluggish Musculoskeletal: No edema b/l   Data Reviewed: Basic Metabolic Panel:  Recent Labs Lab 11/23/15 1325 11/24/15 0839 11/25/15 0655 11/26/15 0651 11/27/15 0653 11/27/15 0657  NA 136 135 134* 136  --  133*  K 4.5 6.0* 5.5* 3.7  --  4.1  CL 100* 98* 96* 99*  --  98*  CO2 20* 18* 18* 25  --  21*  GLUCOSE 107* 89 90 119*  --  101*  BUN 60* 72* 45* 34*  --  45*  CREATININE 10.61* 11.78* 7.94* 6.07*  --  7.40*  CALCIUM 8.8* 8.9 8.4* 7.8*  --  7.8*  PHOS  --   --   --   --  4.3  --    Liver Function Tests:  Recent Labs Lab 11/23/15 1325 11/24/15 0839  AST 37 62*  ALT 20 34  ALKPHOS 68 65  BILITOT 1.6* 3.9*  PROT 6.9 7.0  ALBUMIN 3.2* 3.3*   CBC:  Recent Labs Lab 11/23/15 1325 11/24/15 0839 11/25/15 0655 11/26/15 0651  WBC 12.9* 15.5* 10.3 7.1  NEUTROABS 11.7*  --   --   --   HGB 12.5* 14.0 13.0 11.7*  HCT 37.7* 42.5 39.2 34.8*  MCV 112.2* 114.6* 112.0* 109.4*  PLT 181 147* 141* 129*   BNP (last 3 results)  Recent Labs  09/11/15 0150  BNP >4500.0*      Recent Results (from the past 240 hour(s))  Culture, blood (routine x 2)     Status: None   Collection Time: 11/23/15  1:29 PM  Result Value Ref Range Status   Specimen Description BLOOD RIGHT ANTECUBITAL  Final   Special Requests   Final    BOTTLES DRAWN AEROBIC AND ANAEROBIC AEB=8CC ANA=6CC   Culture  Setup Time   Final    GRAM POSITIVE COCCI IN CLUSTERS RECOVERED FROM BOTH BOTTLES Gram Stain Report Called to,Read Back By and Verified With: THOMAS K AT 0406 ON [id number] BY FORSYTH K Performed at [facility]    Culture   Final    METHICILLIN RESISTANT STAPHYLOCOCCUS AUREUS Performed at [facility]    Report Status 11/26/2015 FINAL  Final   Organism ID, Bacteria METHICILLIN RESISTANT STAPHYLOCOCCUS AUREUS  Final      Susceptibility   Methicillin resistant staphylococcus aureus - MIC*    CIPROFLOXACIN >=8  RESISTANT Resistant     ERYTHROMYCIN >=8 RESISTANT Resistant     GENTAMICIN <=0.5 SENSITIVE Sensitive     OXACILLIN >=4 RESISTANT Resistant     TETRACYCLINE <=1 SENSITIVE Sensitive     VANCOMYCIN <=0.5 SENSITIVE Sensitive     TRIMETH/SULFA <=10 SENSITIVE Sensitive     CLINDAMYCIN <=0.25 SENSITIVE Sensitive     RIFAMPIN <=0.5 SENSITIVE Sensitive     Inducible Clindamycin NEGATIVE Sensitive     * METHICILLIN RESISTANT STAPHYLOCOCCUS AUREUS  MRSA PCR Screening     Status: Abnormal   Collection Time: 11/23/15  6:44 PM  Result Value Ref Range Status   MRSA by PCR POSITIVE (A) NEGATIVE Final    Comment:        The GeneXpert MRSA Assay (FDA approved for NASAL specimens only), is one component of a comprehensive MRSA colonization surveillance program. It is not intended to diagnose MRSA infection nor to guide or monitor treatment for MRSA infections. RESULT  CALLED TO, READ BACK BY AND VERIFIED WITH: Vincent Peyer AT 2208 ON [id number] BY FORSYTH K   Culture, blood (routine x 2)     Status: None (Preliminary result)   Collection Time: 11/24/15  8:39 AM  Result Value Ref Range Status   Specimen Description RIGHT ANTECUBITAL  Final   Special Requests BOTTLES DRAWN AEROBIC ONLY 6CC  Final   Culture NO GROWTH 3 DAYS  Final   Report Status PENDING  Incomplete  Culture, blood (x 2)     Status: None (Preliminary result)   Collection Time: 11/24/15  2:40 PM  Result Value Ref Range Status   Specimen Description BLOOD HEMODIALYSIS CATHETER VENOUS PORT  Final   Special Requests   Final    BOTTLES DRAWN AEROBIC AND ANAEROBIC AEB=10CC ANA=8CC   Culture NO GROWTH 3 DAYS  Final   Report Status PENDING  Incomplete  Culture, blood (x 2)     Status: None (Preliminary result)   Collection Time: 11/24/15  3:30 PM  Result Value Ref Range Status   Specimen Description BLOOD HD ART PORT DRAWN BY RN  Final   Special Requests   Final    BOTTLES DRAWN AEROBIC AND ANAEROBIC AEB=9CC ANA=6CC   Culture NO GROWTH 3  DAYS  Final   Report Status PENDING  Incomplete  Cath Tip Culture     Status: None (Preliminary result)   Collection Time: 11/26/15  9:28 AM  Result Value Ref Range Status   Specimen Description HEMODIALYSIS CATHETER  Final   Special Requests NONE  Final   Culture   Final    Culture reincubated for better growth Performed at [facility]    Report Status PENDING  Incomplete     Studies:  Scheduled Meds: . Chlorhexidine Gluconate Cloth  6 each Topical Q0600  . emtricitabine  200 mg Oral Once per day on Mon Thu  . etravirine  200 mg Oral Q12H  . guaiFENesin  1,200 mg Oral BID  . ipratropium-albuterol  3 mL Nebulization TID  . metoprolol succinate  50 mg Oral BID  . mupirocin ointment  1 application Nasal BID  . pantoprazole  40 mg Oral Daily  . raltegravir  400 mg Oral BID  . sulfamethoxazole-trimethoprim  0.5 tablet Oral Once per day on Mon Wed Fri  . tenofovir  300 mg Oral Weekly  . vancomycin  500 mg Intravenous Q M,W,F-HD   Continuous Infusions: . sodium chloride 10 mL/hr (11/24/15 2308)    Active Problems:   HIV (human immunodeficiency virus infection) (HCC)   ESRD on hemodialysis (HCC)   MRSA bacteremia   Fever   Nonischemic cardiomyopathy (HCC)    Time spent: 25 minutes   [name], MD.  [facility] Pager [phone]. If 7PM-7AM, please contact night-coverage at [email or web address], password [facility] 11/28/2015, 7:55 AM  LOS: 4 days     By signing my name below, I, Burnett Harry, attest that this documentation has been prepared under the direction and in the presence of [facility]. MD Electronically Signed: Burnett Harry, Scribe. 11/28/2015 12:27pm   I, Dr. [name], personally performed the services described in this documentaiton. All medical record entries made by the scribe were at my direction and in my presence. I have reviewed the chart and agree that the record reflects my personal performance and is accurate and  complete  [name], MD, 11/28/2015 12:38 PM

## 2015-11-28 NOTE — Progress Notes (Signed)
Hypoglycemic Event  CBG: 44  Treatment: Apple Juice with sugar approx. 8 oz  Symptoms: "feeling hot", nausea,  Mild confusion, hypothermia   Follow-up CBG: Time: 0900 CBG Result: 70's  Treatment: 25 ml D50  Follow-up CBG: Time: 0915    CBG Result: 129   Possible Reasons for Event: poor po intake possibly  Comments/MD notified:  Patient was found on the floor by respiratory therapist. Patient stated that he did not fall, but laid on the floor because he was so hot.  Dr. Kerry Hough was notified and new orders were given and followed.  After the [facility] given with the recheck the patient stated that he felt better and he was not nauseous.  He refused nausea med at this time.  I will continue to monitor him.    Diona Foley

## 2015-11-28 NOTE — Accreditation Note (Signed)
Hypoglycemic Event  CBG: 45  Treatment: 25 ml D50  Symptoms: c/o not feeling well  Follow-up CBG: Time 1700  CBG Result: 135  Possible Reasons for Event: poor appetite and vomiting  Comments/MD notified: Dr. Kerry Hough text paged about the event.  Made him aware the patient vomited x 2 today.  He is unable to eat anything.  He did drink some juice today.  Also notified MD that the xray results were available.    Diona Foley

## 2015-11-28 NOTE — Progress Notes (Signed)
   11/28/15 0845  Vitals  Temp (!) 94.1 F (34.5 C) (MD aware)  Temp Source Oral  BP 110/79 mmHg  BP Location Right Arm  BP Method Automatic  Patient Position (if appropriate) Lying  Pulse Rate 91  Pulse Rate Source Dinamap  Oxygen Therapy  SpO2 98 %  O2 Device Room Air

## 2015-11-28 NOTE — Progress Notes (Signed)
Patient refused the Milk and Molasses enema.  Will text page MD.

## 2015-11-28 NOTE — Progress Notes (Signed)
Transportation for [facility] to have the patient transported to the hospital by 0900 has been arranged by Patent attorney.  This was confirmed with her.

## 2015-11-29 ENCOUNTER — Inpatient Hospital Stay (HOSPITAL_COMMUNITY): Payer: Medicare Other

## 2015-11-29 ENCOUNTER — Encounter (HOSPITAL_COMMUNITY): Payer: Self-pay

## 2015-11-29 ENCOUNTER — Ambulatory Visit (HOSPITAL_COMMUNITY)
Admit: 2015-11-29 | Discharge: 2015-11-29 | Disposition: A | Discharge status: Home / Self Care | Payer: Medicare Other | Attending: Nephrology | Admitting: Nephrology

## 2015-11-29 DIAGNOSIS — J45909 Unspecified asthma, uncomplicated: Secondary | ICD-10-CM | POA: Insufficient documentation

## 2015-11-29 DIAGNOSIS — F17210 Nicotine dependence, cigarettes, uncomplicated: Secondary | ICD-10-CM | POA: Insufficient documentation

## 2015-11-29 DIAGNOSIS — Z992 Dependence on renal dialysis: Secondary | ICD-10-CM | POA: Insufficient documentation

## 2015-11-29 DIAGNOSIS — B20 Human immunodeficiency virus [HIV] disease: Secondary | ICD-10-CM | POA: Insufficient documentation

## 2015-11-29 DIAGNOSIS — Z4901 Encounter for fitting and adjustment of extracorporeal dialysis catheter: Secondary | ICD-10-CM | POA: Diagnosis not present

## 2015-11-29 DIAGNOSIS — Z936 Other artificial openings of urinary tract status: Secondary | ICD-10-CM

## 2015-11-29 DIAGNOSIS — I429 Cardiomyopathy, unspecified: Secondary | ICD-10-CM | POA: Insufficient documentation

## 2015-11-29 DIAGNOSIS — A4902 Methicillin resistant Staphylococcus aureus infection, unspecified site: Secondary | ICD-10-CM

## 2015-11-29 DIAGNOSIS — N186 End stage renal disease: Secondary | ICD-10-CM

## 2015-11-29 LAB — CBC
HEMATOCRIT: 41.2 % (ref 39.0–52.0)
HEMATOCRIT: 40 % (ref 39.0–52.0)
HEMOGLOBIN: 14.3 g/dL (ref 13.0–17.0)
HEMOGLOBIN: 13.6 g/dL (ref 13.0–17.0)
MCH: 37.2 pg — AB (ref 26.0–34.0)
MCH: 37.2 pg — ABNORMAL HIGH (ref 26.0–34.0)
MCHC: 34.7 g/dL (ref 30.0–36.0)
MCHC: 34 g/dL (ref 30.0–36.0)
MCV: 107.3 fL — AB (ref 78.0–100.0)
MCV: 109.3 fL — ABNORMAL HIGH (ref 78.0–100.0)
Platelets: 149 10*3/uL — ABNORMAL LOW (ref 150–400)
Platelets: 143 10*3/uL — ABNORMAL LOW (ref 150–400)
RBC: 3.84 MIL/uL — AB (ref 4.22–5.81)
RBC: 3.66 MIL/uL — ABNORMAL LOW (ref 4.22–5.81)
RDW: 15.3 % (ref 11.5–15.5)
RDW: 15.5 % (ref 11.5–15.5)
WBC: 15 10*3/uL — ABNORMAL HIGH (ref 4.0–10.5)
WBC: 11.5 10*3/uL — AB (ref 4.0–10.5)

## 2015-11-29 LAB — HEPATIC FUNCTION PANEL
ALT: 30 U/L (ref 17–63)
AST: 262 U/L — ABNORMAL HIGH (ref 15–41)
Albumin: 2.6 g/dL — ABNORMAL LOW (ref 3.5–5.0)
Alkaline Phosphatase: 69 U/L (ref 38–126)
BILIRUBIN DIRECT: 1.9 mg/dL — AB (ref 0.1–0.5)
BILIRUBIN INDIRECT: 1.9 mg/dL — AB (ref 0.3–0.9)
BILIRUBIN TOTAL: 3.8 mg/dL — AB (ref 0.3–1.2)
Total Protein: 6.1 g/dL — ABNORMAL LOW (ref 6.5–8.1)

## 2015-11-29 LAB — CULTURE, BLOOD (ROUTINE X 2)
CULTURE: NO GROWTH
Culture: NO GROWTH
Culture: NO GROWTH

## 2015-11-29 LAB — RENAL FUNCTION PANEL
ANION GAP: 23 — AB (ref 5–15)
Albumin: 2.4 g/dL — ABNORMAL LOW (ref 3.5–5.0)
BUN: 82 mg/dL — ABNORMAL HIGH (ref 6–20)
CALCIUM: 8.1 mg/dL — AB (ref 8.9–10.3)
CHLORIDE: 93 mmol/L — AB (ref 101–111)
CO2: 13 mmol/L — AB (ref 22–32)
Creatinine, Ser: 11.32 mg/dL — ABNORMAL HIGH (ref 0.61–1.24)
GFR calc non Af Amer: 5 mL/min — ABNORMAL LOW (ref >60)
GFR, EST AFRICAN AMERICAN: 6 mL/min — AB (ref >60)
GLUCOSE: 106 mg/dL — AB (ref 65–99)
POTASSIUM: 5.7 mmol/L — AB (ref 3.5–5.1)
Phosphorus: 8.4 mg/dL — ABNORMAL HIGH (ref 2.5–4.6)
Sodium: 129 mmol/L — ABNORMAL LOW (ref 135–145)

## 2015-11-29 LAB — GLUCOSE, CAPILLARY
GLUCOSE-CAPILLARY: 93 mg/dL (ref 65–99)
Glucose-Capillary: 103 mg/dL — ABNORMAL HIGH (ref 65–99)
Glucose-Capillary: 100 mg/dL — ABNORMAL HIGH (ref 65–99)
Glucose-Capillary: 104 mg/dL — ABNORMAL HIGH (ref 65–99)
Glucose-Capillary: 108 mg/dL — ABNORMAL HIGH (ref 65–99)
Glucose-Capillary: 103 mg/dL — ABNORMAL HIGH (ref 65–99)
Glucose-Capillary: 90 mg/dL (ref 65–99)

## 2015-11-29 LAB — BASIC METABOLIC PANEL
ANION GAP: 21 — AB (ref 5–15)
BUN: 82 mg/dL — ABNORMAL HIGH (ref 6–20)
CO2: 15 mmol/L — ABNORMAL LOW (ref 22–32)
Calcium: 8.1 mg/dL — ABNORMAL LOW (ref 8.9–10.3)
Chloride: 93 mmol/L — ABNORMAL LOW (ref 101–111)
Creatinine, Ser: 11.03 mg/dL — ABNORMAL HIGH (ref 0.61–1.24)
GFR calc Af Amer: 7 mL/min — ABNORMAL LOW (ref >60)
GFR, EST NON AFRICAN AMERICAN: 6 mL/min — AB (ref >60)
GLUCOSE: 109 mg/dL — AB (ref 65–99)
POTASSIUM: 5.7 mmol/L — AB (ref 3.5–5.1)
Sodium: 129 mmol/L — ABNORMAL LOW (ref 135–145)

## 2015-11-29 LAB — CATH TIP CULTURE

## 2015-11-29 LAB — LIPASE, BLOOD: LIPASE: 284 U/L — AB (ref 11–51)

## 2015-11-29 LAB — PROTIME-INR
INR: 1.8 — ABNORMAL HIGH (ref 0.00–1.49)
PROTHROMBIN TIME: 20.9 s — AB (ref 11.6–15.2)

## 2015-11-29 MED ORDER — HEPARIN SODIUM (PORCINE) 1000 UNIT/ML IJ SOLN
INTRAMUSCULAR | Status: AC
  Filled 2015-11-29: qty 1

## 2015-11-29 MED ORDER — IOHEXOL 300 MG/ML  SOLN
100.0000 mL | Freq: Once | INTRAMUSCULAR | Status: AC | PRN
Start: 2015-11-29 — End: 2015-11-29
  Administered 2015-11-29: 100 mL via INTRAVENOUS

## 2015-11-29 MED ORDER — FAMOTIDINE 10 MG PO TABS
10.0000 mg | ORAL_TABLET | Freq: Once | ORAL | Status: AC
  Administered 2015-11-29: 10 mg via ORAL
  Filled 2015-11-29: qty 1

## 2015-11-29 MED ORDER — DIATRIZOATE MEGLUMINE & SODIUM 66-10 % PO SOLN
ORAL | Status: AC
  Filled 2015-11-29: qty 30

## 2015-11-29 MED ORDER — LIDOCAINE HCL 1 % IJ SOLN
INTRAMUSCULAR | Status: AC
  Filled 2015-11-29: qty 20

## 2015-11-29 MED ORDER — SODIUM CHLORIDE 0.9 % IV SOLN: 100.0000 mL | INTRAVENOUS | Status: DC | PRN

## 2015-11-29 MED ORDER — HEPARIN SODIUM (PORCINE) 1000 UNIT/ML IJ SOLN
INTRAMUSCULAR | Status: AC
  Filled 2015-11-29: qty 5

## 2015-11-29 NOTE — Progress Notes (Signed)
Subjective: Patient denies any nausea or vomiting. He denies also any difficulty breathing.  Objective: Vital signs in last 24 hours: Temp:  [94.1 F (34.5 C)-97.5 F (36.4 C)] 97.3 F (36.3 C) (02/13 0525) Pulse Rate:  [88-92] 88 (02/13 0525) Resp:  [18-20] 20 (02/13 0525) BP: (100-112)/(70-80) 104/72 mmHg (02/13 0525) SpO2:  [97 %-100 %] 97 % (02/13 0812)  Intake/Output from previous day:   Intake/Output this shift:    No results for input(s): HGB in the last 72 hours. No results for input(s): WBC, RBC, HCT, PLT in the last 72 hours.  Recent Labs  11/27/15 0657 11/28/15 0949  NA 133* 133*  K 4.1 5.0  CL 98* 95*  CO2 21* 17*  BUN 45* 61*  CREATININE 7.40* 9.41*  GLUCOSE 101* 136*  CALCIUM 7.8* 8.1*   No results for input(s): LABPT, INR in the last 72 hours.  Patient is alert and in no acute distress Chest is clear to auscultation Heart exam regular rate and rhythm no murmur Extremities no edema   Assessment/Plan: Problem #1 MRSA infection. Presently patient is on antibiotics and his catheter is removed. Patient is a febrile. Problem #2 end-stage renal disease: Patient is status post hemodialysis on Thursday. Patient denies any nausea or vomiting. Patient will get dialysis after catheter placement today. Problem #3 hyperkalemia: His potassium is normal from yesterday. They were unable to get blood work today. Problem #4 anemia: His hemoglobin is above our target goal. Patient is off  Epogen. Problem #5. Metabolic bone disease: Calcium is range. Patient presently is not on any binder. Problem #6 fluid management: Patient does not have any significant sign of fluid overload. Problem #7 HIV positive Plan: 1] we'll dialyze patient for 4 hours when he comes back 2] would remove about 3 L if his blood pressure tolerates. 3] will continue his other medications as before.   Nelsy Madonna S 11/29/2015, 8:36 AM

## 2015-11-29 NOTE — Procedures (Signed)
Placement of left jugular trialysis catheter.  Tip at SVC/RA junction.  Ready to use.  No immediate complication.

## 2015-11-29 NOTE — Consult Note (Signed)
Chief Complaint: ESRD needs HD access Referring Physician: Dr. Fran Lowes HPI: Dave Estrada is an 25 y.o. male with a history of an MI a couple of years ago with subsequent renal failure.  He has been dialysis dependent since 2014.  He has a significant cardiomyopathy with an EF of 10-15%.  He was admitted to [facility] with fevers and noted to have MRSA bacteremia from his prior right-sided tunneled catheter.  This was removed by Dr. Arnoldo Morale on 2/10.  The tip was sent for culture and results are as noted above.  He has been treated for the last 3 days with vancomycin.  A request has been made for a new perm cath so he can resume his HD.  Past Medical History:  Past Medical History  Diagnosis Date  . HIV (human immunodeficiency virus infection) ([place])   . ESRD (end stage renal disease) ([place])   . Staphylococcus aureus bacteremia with sepsis ([place]) 09/09/2014  . A-V fistula ([place])     Placed on 10/27/2014 at [facility]; for future HD use.   . Systolic dysfunction, left ventricle 12/20/2014    EF 20%   . Asthma     Past Surgical History:  Past Surgical History  Procedure Laterality Date  . Port a cath revision    . Tee without cardioversion N/A 09/14/2014    Procedure: TRANSESOPHAGEAL ECHOCARDIOGRAM (TEE);  Surgeon: Larey Dresser, MD;  Location: [facility] ENDOSCOPY;  Service: Cardiovascular;  Laterality: N/A;  will be at [facility] by mon  . Insertion of dialysis catheter Left 09/15/2014    Procedure: INSERTION OF DIALYSIS CATHETER;  Surgeon: Serafina Mitchell, MD;  Location: [facility] OR;  Service: Vascular;  Laterality: Left;    Family History:  Family History  Problem Relation Age of Onset  . Seizures Father     Social History:  reports that he has been smoking Cigarettes.  He started smoking about 12 years ago. He has a 11 pack-year smoking history. He has never used smokeless tobacco. He reports that he uses illicit drugs (Marijuana). He reports that he does not drink alcohol.  Allergies:  Allergies    Allergen Reactions  . Bee Venom Anaphylaxis  . Other Anaphylaxis    mushrooms  . Aspirin Other (See Comments)    Reaction is unknown    Medications:   Medication List    Notice    This visit is during an admission. Changes to the med list made in this visit will be reflected in the After Visit Summary of the admission.      Please HPI for pertinent positives, otherwise complete 10 system ROS negative, except for recent fevers secondary to his bacteremia.  + heart burn, + weakness  Mallampati Score: MD Evaluation Airway: WNL Heart: WNL Abdomen: WNL Chest/ Lungs: WNL ASA  Classification: 4 Mallampati/Airway Score: Two  Physical Exam: There were no vitals taken for this visit. There is no weight on file to calculate BMI.  General: pleasant, WD, WN black male who is laying in bed in NAD HEENT: head is normocephalic, atraumatic.  Sclera are noninjected.  PERRL.  Ears and nose without any masses or lesions.  Mouth is pink, but dry.  Tongue has a coating of white yeast present Heart: regular, rate, and rhythm.  Normal s1,s2. No obvious murmurs, gallops, or rubs noted.  Palpable radial and pedal pulses bilaterally Lungs: CTAB, no wheezes, rhonchi, or rales noted.  Respiratory effort nonlabored Abd: soft, NT, ND, +BS, no masses, hernias, or organomegaly  MS: all 4 extremities are symmetrical with no cyanosis, clubbing.  +1 pedal edema Skin: warm and dry with no masses, lesions, or rashes, multiple tattoos  Psych: A&Ox3 with an appropriate affect.   Labs: Results for orders placed or performed during the hospital encounter of 11/23/15 (from the past 48 hour(s))  Glucose, capillary     Status: Abnormal   Collection Time: 11/28/15  8:45 AM  Result Value Ref Range   Glucose-Capillary 44 (LL) 65 - 99 mg/dL  Glucose, capillary     Status: None   Collection Time: 11/28/15  9:29 AM  Result Value Ref Range   Glucose-Capillary 72 65 - 99 mg/dL  Basic metabolic panel     Status:  Abnormal   Collection Time: 11/28/15  9:49 AM  Result Value Ref Range   Sodium 133 (L) 135 - 145 mmol/L   Potassium 5.0 3.5 - 5.1 mmol/L    Comment: DELTA CHECK NOTED   Chloride 95 (L) 101 - 111 mmol/L   CO2 17 (L) 22 - 32 mmol/L   Glucose, Bld 136 (H) 65 - 99 mg/dL   BUN 61 (H) 6 - 20 mg/dL   Creatinine, Ser 9.41 (H) 0.61 - 1.24 mg/dL   Calcium 8.1 (L) 8.9 - 10.3 mg/dL   GFR calc non Af Amer 7 (L) >60 mL/min   GFR calc Af Amer 8 (L) >60 mL/min    Comment: (NOTE) The eGFR has been calculated using the CKD EPI equation. This calculation has not been validated in all clinical situations. eGFR's persistently <60 mL/min signify possible Chronic Kidney Disease.    Anion gap 21 (H) 5 - 15  Glucose, capillary     Status: Abnormal   Collection Time: 11/28/15  9:51 AM  Result Value Ref Range   Glucose-Capillary 129 (H) 65 - 99 mg/dL  Glucose, capillary     Status: None   Collection Time: 11/28/15 12:25 PM  Result Value Ref Range   Glucose-Capillary 86 65 - 99 mg/dL  Glucose, capillary     Status: Abnormal   Collection Time: 11/28/15  4:37 PM  Result Value Ref Range   Glucose-Capillary 45 (L) 65 - 99 mg/dL   Comment 1 Notify RN    Comment 2 Document in Chart    Comment 3 Repeat Test   Glucose, capillary     Status: Abnormal   Collection Time: 11/28/15  5:04 PM  Result Value Ref Range   Glucose-Capillary 135 (H) 65 - 99 mg/dL  Cortisol     Status: None   Collection Time: 11/28/15  6:42 PM  Result Value Ref Range   Cortisol, Plasma 32.1 ug/dL    Comment: (NOTE) AM    6.7 - 22.6 ug/dL PM   <10.0       ug/dL Performed at [facility]   Glucose, capillary     Status: None   Collection Time: 11/28/15  8:09 PM  Result Value Ref Range   Glucose-Capillary 94 65 - 99 mg/dL   Comment 1 Notify RN    Comment 2 Document in Chart   Glucose, capillary     Status: None   Collection Time: 11/28/15 10:37 PM  Result Value Ref Range   Glucose-Capillary 93 65 - 99 mg/dL  Glucose,  capillary     Status: Abnormal   Collection Time: 11/29/15 12:22 AM  Result Value Ref Range   Glucose-Capillary 100 (H) 65 - 99 mg/dL  Glucose, capillary     Status: Abnormal  Collection Time: 11/29/15  2:37 AM  Result Value Ref Range   Glucose-Capillary 104 (H) 65 - 99 mg/dL  Glucose, capillary     Status: Abnormal   Collection Time: 11/29/15  5:18 AM  Result Value Ref Range   Glucose-Capillary 103 (H) 65 - 99 mg/dL   Comment 1 Notify RN    Comment 2 Document in Chart   Glucose, capillary     Status: Abnormal   Collection Time: 11/29/15  8:01 AM  Result Value Ref Range   Glucose-Capillary 108 (H) 65 - 99 mg/dL   Comment 1 Notify RN     Imaging: Dg Abd Portable 1v  11/28/2015  CLINICAL DATA:  Pain with nausea and vomiting EXAM: PORTABLE ABDOMEN - 1 VIEW COMPARISON:  None. FINDINGS: There is a paucity of gas in the abdomen and pelvis. There is no bowel dilatation or air-fluid level suggesting obstruction. No free air. Lung bases are clear. There is a small phlebolith in the pelvis. IMPRESSION: Paucity of gas in the abdomen or pelvis. While this finding may be seen normally, it raises question of ileus or enteritis. Obstruction not felt to be likely. No free air is seen on this supine examination. Lung bases clear. Electronically Signed   By: Lowella Grip III M.D.   On: 11/28/2015 15:36    Assessment/Plan 1. ESRD, needs access after prior catheter removed for MRSA bacteremia -we will plan to place a new tunneled perm cath today.  Given his most recent one was on the right side, we will likely try to place this on the other side, but that decision will be made by Dr. Anselm Pancoast. -cbc just returned.  His platelets are ok, but his WBC is up from 9 to 15K.  INR still pending.  This was 1.92 5 days ago. -The risks and complications has been d/w the patient, including bleeding, infection, injury to the vessel, heart or lung complications due to sedation.  The patient understands and is  agreeable to proceed.  Thank you for this interesting consult.  I greatly enjoyed meeting Dave Estrada and look forward to participating in their care.  A copy of this report was sent to the requesting provider on this date.  Electronically Signed: Henreitta Cea 11/29/2015, 9:52 AM   I spent a total of 20 Minutes   in face to face in clinical consultation, greater than 50% of which was counseling/coordinating care for ESRD need for access via perm cath.

## 2015-11-29 NOTE — Progress Notes (Signed)
Returned to room via [facility] with new left IJ  Permacath.  Assisted to bathroom. Scheduled for dialysis today.

## 2015-11-29 NOTE — Sedation Documentation (Addendum)
Labs drawn, PA to see in holding area.  Pt aware of procedure today, questions answered.  Awaiting Ir room availability and decision about temp vs tunnelled due to WBC elevation today.

## 2015-11-29 NOTE — Procedures (Signed)
   HEMODIALYSIS TREATMENT NOTE:  4 hour heparin-free dialysis completed via newly placed temporary L IJ catheter.  Goal met: 3 liters removed without interruption in ultrafiltration. Vancomycin 537m given at end of HD. All blood was returned. Report given to Dave Estrada and SVita Barley primary nursing team.  ARockwell Alexandria RN, CDN

## 2015-11-29 NOTE — Progress Notes (Signed)
Pharmacy Antibiotic Note  Dave Estrada is a 25 y.o. male admitted on 11/23/2015 with bacteremia and fever.  Pharmacy consulted for Vancomycin MRSA bacteremia Presently is afebrile. Patient tx narrowed to Vancomycin. Source of infection at this moment is not clear however possibility of induced infection from his dialysis catheter which was removed 2/10. Placed in [place] folder. TEE negative for vegetations. Tunneled catheter place today and planned dialysis.  Plan: Vancomycin 500mg  IV after every HD(usually TTHSat, now MWF)   Check pre HD level prior to next HD on 2/15.  Height: 5\' 5"  (165.1 cm) Weight: 123 lb 14.4 oz (56.201 kg) IBW/kg (Calculated) : 61.5  Temp (24hrs), Avg:97.3 F (36.3 C), Min:97.3 F (36.3 C), Max:97.3 F (36.3 C)   Recent Labs Lab 11/23/15 1325 11/23/15 1337 11/24/15 0839 11/24/15 1440 11/24/15 1737 11/25/15 0100 11/25/15 0655 11/26/15 0651 11/27/15 0657 11/28/15 0949 11/29/15 0932  WBC 12.9*  --  15.5*  --   --   --  10.3 7.1  --   --  15.0*  CREATININE 10.61*  --  11.78*  --   --   --  7.94* 6.07* 7.40* 9.41*  --   LATICACIDVEN  --  2.55*  --  9.3* 2.7* 6.8* 6.7*  --   --   --   --     Estimated Creatinine Clearance: 9.5 mL/min (by C-G formula based on Cr of 9.41).    Allergies  Allergen Reactions  . Bee Venom Anaphylaxis  . Other Anaphylaxis    mushrooms  . Aspirin Other (See Comments)    Reaction is unknown    Antimicrobials this admission: Vancomcyin 11/23/15  >>  Zosyn 11/23/15 >>11/29/2015  Ancef 11/25/15 >>11/26/2015  Dose adjustments this admission: Currently n/a  Microbiology results: 2/7 BCx: MRSA  2/8 Repeat BCx: negative 2/7 MRSA PCR: (+) 2/10 Cath tip + MRSA.   Thank you for allowing pharmacy to be a part of this patient's care. Elder Cyphers, BS Loura Back, [place] Clinical Pharmacist Pager [phone] 11/29/2015 3:34 PM

## 2015-11-29 NOTE — Progress Notes (Signed)
Mid level paged, to notify her regarding patient's diet. He received his dialysis catheter and had been dialysed. Back in his room and drinking fluids while NPO. Awaiting for order.

## 2015-11-29 NOTE — Progress Notes (Signed)
[facility] PROGRESS NOTE  Dave Estrada UYQ:[id number] DOB: 1991/09/26 DOA: 11/23/2015 PCP: [name] [name], MD  Assessment/Plan: 1. Sepsis due to MRSA bacteremia. Patient had 1/2 sets of blood cultures positive for MRSA on 2/7. Repeat blood cultures on 2/8 have shown no growth. Will continue Vancomycin. He does not have any evidence of cellulitis/ back pain. CXR does not show any evidence of pna. Source is likely his dialysis catheter which was removed on 2/10 by general surgery. Catheter tip cultures revealed MRSA. He is hemodynamically stable and afebrile. He will likely need a prolonged course of IV abx for 4-6 weeks. TTE negative for vegetations.  2. HIV, continue anti-retroviral therapy. Last CD-4 from 08/2015 was 230.  3. ESRD, Nephrology following. Patient has an AV fistula, but apparently this is non-functional. Dialysis completed without complication on 2/09 and 2/13. New tunneled catheter placed today.  4. 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 No evidence of decompensated CHF at this point. Will need to follow volume status very closely in the setting of hydration due to sepsis. ECHO as below. Will continue metroprolol. Patient is scheduled for outpatient cardiac MRI. 5. Hyperkalemia, related to renal disease, appears resolved. ACE discontinued 6. Hypoglycemia, patient had a hypoglycemic 2/12. Possibly related to decreased PO intake. Patient started on dextrose infusion with improvement of his blood sugars. Blood sugars are now improved.  7. Abdominal pain. Xray indicated possible ileus. Patient does report bowel movement overnight.. Due to persistent  pain, nausea, and poor PO intake will order CT A/P and check  lipase and LFTs.  Code Status: Full DVT prophylaxis: SCD's Family Communication: No family at bedside Disposition Plan: Anticipate discharge when improved   Consultants:  Nephrology  General surgery  Procedures:  Hemo-Dialysis, 2 liters removed  on 2/09  ECHO Study Conclusions  - Left ventricle: The cavity size was moderately dilated. Wall thickness was normal. Systolic function was severely reduced. The estimated ejection fraction was in the range of 20% to 25%. Diffuse hypokinesis. Features are consistent with a pseudonormal left ventricular filling pattern, with concomitant abnormal relaxation and increased filling pressure (grade 2 diastolic dysfunction). Doppler parameters are consistent with high ventricular filling pressure. 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 There was moderate to severe regurgitation. - Left atrium: The atrium was moderately dilated. - Right ventricle: The cavity size was mildly dilated. Systolic function was severely reduced. - Right atrium: The atrium was moderately dilated. - Tricuspid valve: There was moderate regurgitation. - Pulmonary arteries: Systolic pressure was mildly increased. PA peak pressure: 39 mm Hg (S). - Pericardium, extracardiac: A small pericardial effusion was identified.  Antibiotics:  Vancomycin 2/7 >>  Zosyn 2/7 >> 2/9  Ancef 2/7>>2/10  HPI/Subjective: Persistent abdominal pain. Has not been able to eat. Denies nausea and vomiting. Bowel movement last night was normal. Mouth is dry.   Objective: Filed Vitals:   11/28/15 2015 11/29/15 0525  BP: 112/80 104/72  Pulse: 92 88  Temp: 97.3 F (36.3 C) 97.3 F (36.3 C)  Resp: 18 20    Intake/Output Summary (Last 24 hours) at 11/29/15 1346 Last data filed at 11/28/15 1730  Gross per 24 hour  Intake      0 ml  Output      0 ml  Net      0 ml   Filed Weights   11/26/15 0521 11/27/15 0729 11/28/15 0606  Weight: 57.244 kg (126 lb 3.2 oz) 57.471 kg (126 lb 11.2 oz) 56.201  kg (123 lb 14.4 oz)    Exam: General: NAD Cardiovascular: RRR, S1, S2  Respiratory: clear bilaterally, No wheezing, rales or rhonchi Abdomen: soft, mild tenderness in  epigastric region, no distention , bowel sounds sluggish Musculoskeletal: No edema b/l   Data Reviewed: Basic Metabolic Panel:  Recent Labs Lab 11/24/15 0839 11/25/15 0655 11/26/15 0651 11/27/15 0653 11/27/15 0657 11/28/15 0949  NA 135 134* 136  --  133* 133*  K 6.0* 5.5* 3.7  --  4.1 5.0  CL 98* 96* 99*  --  98* 95*  CO2 18* 18* 25  --  21* 17*  GLUCOSE 89 90 119*  --  101* 136*  BUN 72* 45* 34*  --  45* 61*  CREATININE 11.78* 7.94* 6.07*  --  7.40* 9.41*  CALCIUM 8.9 8.4* 7.8*  --  7.8* 8.1*  PHOS  --   --   --  4.3  --   --    Liver Function Tests:  Recent Labs Lab 11/23/15 1325 11/24/15 0839  AST 37 62*  ALT 20 34  ALKPHOS 68 65  BILITOT 1.6* 3.9*  PROT 6.9 7.0  ALBUMIN 3.2* 3.3*   CBC:  Recent Labs Lab 11/23/15 1325 11/24/15 0839 11/25/15 0655 11/26/15 0651 11/29/15 0932  WBC 12.9* 15.5* 10.3 7.1 15.0*  NEUTROABS 11.7*  --   --   --   --   HGB 12.5* 14.0 13.0 11.7* 14.3  HCT 37.7* 42.5 39.2 34.8* 41.2  MCV 112.2* 114.6* 112.0* 109.4* 107.3*  PLT 181 147* 141* 129* 149*   BNP (last 3 results)  Recent Labs  09/11/15 0150  BNP >4500.0*      Recent Results (from the past 240 hour(s))  Culture, blood (routine x 2)     Status: None   Collection Time: 11/23/15  1:29 PM  Result Value Ref Range Status   Specimen Description BLOOD RIGHT ANTECUBITAL  Final   Special Requests   Final    BOTTLES DRAWN AEROBIC AND ANAEROBIC AEB=8CC ANA=6CC   Culture  Setup Time   Final    GRAM POSITIVE COCCI IN CLUSTERS RECOVERED FROM BOTH BOTTLES Gram Stain Report Called to,Read Back By and Verified With: THOMAS K AT 0406 ON [id number] BY FORSYTH K Performed at [facility]    Culture   Final    METHICILLIN RESISTANT STAPHYLOCOCCUS AUREUS Performed at [facility]    Report Status 11/26/2015 FINAL  Final   Organism ID, Bacteria METHICILLIN RESISTANT STAPHYLOCOCCUS AUREUS  Final      Susceptibility   Methicillin resistant staphylococcus aureus - MIC*     CIPROFLOXACIN >=8 RESISTANT Resistant     ERYTHROMYCIN >=8 RESISTANT Resistant     GENTAMICIN <=0.5 SENSITIVE Sensitive     OXACILLIN >=4 RESISTANT Resistant     TETRACYCLINE <=1 SENSITIVE Sensitive     VANCOMYCIN <=0.5 SENSITIVE Sensitive     TRIMETH/SULFA <=10 SENSITIVE Sensitive     CLINDAMYCIN <=0.25 SENSITIVE Sensitive     RIFAMPIN <=0.5 SENSITIVE Sensitive     Inducible Clindamycin NEGATIVE Sensitive     * METHICILLIN RESISTANT STAPHYLOCOCCUS AUREUS  MRSA PCR Screening     Status: Abnormal   Collection Time: 11/23/15  6:44 PM  Result Value Ref Range Status   MRSA by PCR POSITIVE (A) NEGATIVE Final    Comment:        The GeneXpert MRSA Assay (FDA approved for NASAL specimens only), is one component of a comprehensive MRSA colonization surveillance program. It is not  intended to diagnose MRSA infection nor to guide or monitor treatment for MRSA infections. RESULT CALLED TO, READ BACK BY AND VERIFIED WITH: THOMAS K AT 2208 ON [id number] BY FORSYTH K   Culture, blood (routine x 2)     Status: None   Collection Time: 11/24/15  8:39 AM  Result Value Ref Range Status   Specimen Description RIGHT ANTECUBITAL  Final   Special Requests BOTTLES DRAWN AEROBIC ONLY 6CC  Final   Culture NO GROWTH 5 DAYS  Final   Report Status 11/29/2015 FINAL  Final  Culture, blood (x 2)     Status: None   Collection Time: 11/24/15  2:40 PM  Result Value Ref Range Status   Specimen Description BLOOD HEMODIALYSIS CATHETER VENOUS PORT  Final   Special Requests   Final    BOTTLES DRAWN AEROBIC AND ANAEROBIC AEB=10CC ANA=8CC   Culture NO GROWTH 5 DAYS  Final   Report Status 11/29/2015 FINAL  Final  Culture, blood (x 2)     Status: None   Collection Time: 11/24/15  3:30 PM  Result Value Ref Range Status   Specimen Description BLOOD HD ART PORT DRAWN BY RN  Final   Special Requests   Final    BOTTLES DRAWN AEROBIC AND ANAEROBIC AEB=9CC ANA=6CC   Culture NO GROWTH 5 DAYS  Final   Report Status  11/29/2015 FINAL  Final  Cath Tip Culture     Status: None   Collection Time: 11/26/15  9:28 AM  Result Value Ref Range Status   Specimen Description HEMODIALYSIS CATHETER  Final   Special Requests NONE  Final   Culture   Final    >100 COLONIES METHICILLIN RESISTANT STAPHYLOCOCCUS AUREUS Note: RIFAMPIN AND GENTAMICIN SHOULD NOT BE USED AS SINGLE DRUGS FOR TREATMENT OF STAPH INFECTIONS. This organism DOES NOT demonstrate inducible Clindamycin resistance in vitro. CRITICAL RESULT CALLED TO, READ BACK BY AND VERIFIED WITH: Anson Crofts 2/13  @0900  BY REAMM Performed at [facility]    Report Status 11/29/2015 FINAL  Final   Organism ID, Bacteria METHICILLIN RESISTANT STAPHYLOCOCCUS AUREUS  Final      Susceptibility   Methicillin resistant staphylococcus aureus - MIC*    CLINDAMYCIN <=0.25 SENSITIVE Sensitive     ERYTHROMYCIN >=8 RESISTANT Resistant     GENTAMICIN <=0.5 SENSITIVE Sensitive     LEVOFLOXACIN 4 INTERMEDIATE Intermediate     OXACILLIN >=4 RESISTANT Resistant     RIFAMPIN <=0.5 SENSITIVE Sensitive     TRIMETH/SULFA <=10 SENSITIVE Sensitive     VANCOMYCIN 1 SENSITIVE Sensitive     TETRACYCLINE <=1 SENSITIVE Sensitive     * >100 COLONIES METHICILLIN RESISTANT STAPHYLOCOCCUS AUREUS     Studies:  Scheduled Meds: . emtricitabine  200 mg Oral Once per day on Mon Thu  . etravirine  200 mg Oral Q12H  . guaiFENesin  1,200 mg Oral BID  . ipratropium-albuterol  3 mL Nebulization TID  . metoCLOPramide (REGLAN) injection  5 mg Intravenous 4 times per day  . metoprolol succinate  50 mg Oral BID  . milk and molasses  1 enema Rectal Once  . pantoprazole  40 mg Oral Daily  . polyethylene glycol  17 g Oral Daily  . raltegravir  400 mg Oral BID  . sulfamethoxazole-trimethoprim  0.5 tablet Oral Once per day on Mon Wed Fri  . tenofovir  300 mg Oral Weekly  . vancomycin  500 mg Intravenous Q M,W,F-HD   Continuous Infusions: . sodium chloride 10 mL/hr (11/24/15 2308)  .  dextrose  30 mL/hr at 11/28/15 1715    Active Problems:   HIV (human immunodeficiency virus infection) (HCC)   ESRD on hemodialysis (HCC)   MRSA bacteremia   Fever   Nonischemic cardiomyopathy (HCC)    Time spent: 25 minutes   [name], MD.  [facility] Pager [phone]. If 7PM-7AM, please contact night-coverage at [email or web address], password [facility] 11/29/2015, 1:46 PM  LOS: 5 days      By signing my name below, I, [name], attest that this documentation has been prepared under the direction and in the presence of [name], MD. Electronically signed: [name], Scribe.11/29/2015 1:45pm   I, Dr. [name], personally performed the services described in this documentaiton. All medical record entries made by the scribe were at my direction and in my presence. I have reviewed the chart and agree that the record reflects my personal performance and is accurate and complete  [name], MD, 11/29/2015 2:06 PM

## 2015-11-30 LAB — GLUCOSE, CAPILLARY
GLUCOSE-CAPILLARY: 137 mg/dL — AB (ref 65–99)
GLUCOSE-CAPILLARY: 146 mg/dL — AB (ref 65–99)
GLUCOSE-CAPILLARY: 156 mg/dL — AB (ref 65–99)
GLUCOSE-CAPILLARY: 178 mg/dL — AB (ref 65–99)
Glucose-Capillary: 137 mg/dL — ABNORMAL HIGH (ref 65–99)
Glucose-Capillary: 145 mg/dL — ABNORMAL HIGH (ref 65–99)

## 2015-11-30 NOTE — Progress Notes (Signed)
Pt CBG 137.

## 2015-11-30 NOTE — Progress Notes (Signed)
[facility] PROGRESS NOTE  Dave Estrada WUJ:[id number] DOB: 1991/05/08 DOA: 11/23/2015 PCP: [name] [name], MD Summary:   83 yom with history of HIV, end-stage renal disease on hemodialysis, presents to the hospital with fever and sepsis. He was found to have MRSA bacteremia likely related to an infected dialysis catheter. He was started on appropriate antibiotics and dialysis catheter was removed on 2/11. Cultures from 2/8 have shown no growth. New dialysis catheter was placed on 2/13. 2-D echo did not show any evidence of vegetations. He will need a prolonged course of intravenous antibiotics which may be administered with dialysis. His course was complicated by development of persistent hypoglycemia. Patient required several interventions with D50 and was ultimately started on a D10 infusion. His hypoglycemia may have been related to decreased by mouth intake in the setting of nausea and vomiting which may have been related to uremia. After receiving new dialysis catheter and undergoing hemodialysis, his nausea and vomiting appear to have improved and he started tolerating a solid diet. Plan is to discontinue D10 infusion and monitor blood sugars. He will likely be discharged home in the next 1-2 days.  Assessment/Plan: 1. Sepsis due to MRSA bacteremia. Patient had 1/2 sets of blood cultures positive for MRSA on 2/7. Repeat blood cultures on 2/8 have shown no growth. Will continue Vancomycin. He does not have any evidence of cellulitis. CXR does not show any evidence of pna. Source is likely his dialysis catheter which was removed on 2/10 by general surgery. Catheter tip cultures revealed MRSA. He is hemodynamically stable and afebrile. He will need to continue vancomycin in until 01/05/16. TTE negative for vegetations.  2. HIV, continue anti-retroviral therapy. Last CD-4 from 08/2015 was 230.  3. ESRD, Nephrology following. Patient has an AV fistula, but apparently this is non-functional.  Dialysis completed without complication on 2/09 and 2/13. New dialysis catheter placed on 2/13 by interventional radiology  4. 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 No evidence of decompensated CHF at this point. Will need to follow volume status very closely in the setting of hydration due to sepsis. ECHO as below. Will continue metroprolol. Patient is scheduled for outpatient cardiac MRI. 5. Hyperkalemia, related to renal disease, appears resolved. ACE discontinued 6. Hypoglycemia, patient had persistent hypoglycemic episodes that required repeated interventions with D50 and was ultimately started on D10 infusion. Possibly related to decreased PO intake in the setting of nausea vomiting and abdominal pain. Blood sugars are now improving and he appears to be tolerating a solid diet. Will discontinue D10 and observe. 7. Abdominal pain. CT abdomen did not show any acute findings. LFTs and lipase are elevated, but it is unclear if this is a true pancreatitis or biliary source. This may be related to decreased clearance in renal failure and lack of dialysis. On imaging, hepatobiliary tree appears normal as does pancreas. Without any specific intervention (other than dialysis) his symptoms appear to be improving and he is now tolerating a solid diet. Will repeat labs in AM. His nausea may have been related to uremia, which has improved with dialysis. 8. Chronic back pain. Patient reports intermittent low back pain that has been occuring since he was started on dialysis. May need physical therapy.  Code Status: Full DVT prophylaxis: SCD's Family Communication: No family at bedside Disposition Plan: Anticipate discharge when improved   Consultants:  Nephrology  General surgery  Procedures:  Hemo-Dialysis  ECHO Study Conclusions  - Left ventricle: The cavity size was moderately dilated. Wall thickness  was normal. Systolic function was severely reduced. The estimated ejection  fraction was in the range of 20% to 25%. Diffuse hypokinesis. Features are consistent with a pseudonormal left ventricular filling pattern, with concomitant abnormal relaxation and increased filling pressure (grade 2 diastolic dysfunction). Doppler parameters are consistent with high ventricular filling pressure. 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 There was moderate to severe regurgitation. - Left atrium: The atrium was moderately dilated. - Right ventricle: The cavity size was mildly dilated. Systolic function was severely reduced. - Right atrium: The atrium was moderately dilated. - Tricuspid valve: There was moderate regurgitation. - Pulmonary arteries: Systolic pressure was mildly increased. PA peak pressure: 39 mm Hg (S). - Pericardium, extracardiac: A small pericardial effusion was identified.  Antibiotics:  Vancomycin 2/7 >>  Zosyn 2/7 >> 2/9  Ancef 2/7>>2/10  HPI/Subjective: Complaining of chronic intermittent back pain, abdominal pain improving. No nausea or vomiting and is tolerating a solid diet.  Objective: Filed Vitals:   11/30/15 0451 11/30/15 1149  BP: 118/66 118/87  Pulse: 84 93  Temp: 98.2 F (36.8 C)   Resp: 18     Intake/Output Summary (Last 24 hours) at 11/30/15 1232 Last data filed at 11/30/15 1610  Gross per 24 hour  Intake    240 ml  Output   3002 ml  Net  -2762 ml   Filed Weights   11/26/15 0521 11/27/15 0729 11/28/15 0606  Weight: 57.244 kg (126 lb 3.2 oz) 57.471 kg (126 lb 11.2 oz) 56.201 kg (123 lb 14.4 oz)    Exam: General: NAD Cardiovascular: RRR, S1, S2  Respiratory: clear bilaterally, No wheezing, rales or rhonchi Abdomen: soft, mild tenderness in epigastric region, no distention , bowel sounds active Musculoskeletal: No edema b/l   Data Reviewed: Basic Metabolic Panel:  Recent Labs Lab 11/25/15 0655 11/26/15 0651 11/27/15 0653 11/27/15 0657 11/28/15 0949  11/29/15 1618  NA 134* 136  --  133* 133* 129*  129*  K 5.5* 3.7  --  4.1 5.0 5.7*  5.7*  CL 96* 99*  --  98* 95* 93*  93*  CO2 18* 25  --  21* 17* 13*  15*  GLUCOSE 90 119*  --  101* 136* 106*  109*  BUN 45* 34*  --  45* 61* 82*  82*  CREATININE 7.94* 6.07*  --  7.40* 9.41* 11.32*  11.03*  CALCIUM 8.4* 7.8*  --  7.8* 8.1* 8.1*  8.1*  PHOS  --   --  4.3  --   --  8.4*   Liver Function Tests:  Recent Labs Lab 11/23/15 1325 11/24/15 0839 11/29/15 1618  AST 37 62* 262*  ALT 20 34 30  ALKPHOS 68 65 69  BILITOT 1.6* 3.9* 3.8*  PROT 6.9 7.0 6.1*  ALBUMIN 3.2* 3.3* 2.4*  2.6*   CBC:  Recent Labs Lab 11/23/15 1325 11/24/15 0839 11/25/15 0655 11/26/15 0651 11/29/15 0932 11/29/15 1618  WBC 12.9* 15.5* 10.3 7.1 15.0* 11.5*  NEUTROABS 11.7*  --   --   --   --   --   HGB 12.5* 14.0 13.0 11.7* 14.3 13.6  HCT 37.7* 42.5 39.2 34.8* 41.2 40.0  MCV 112.2* 114.6* 112.0* 109.4* 107.3* 109.3*  PLT 181 147* 141* 129* 149* 143*   BNP (last 3 results)  Recent Labs  09/11/15 0150  BNP >4500.0*      Recent Results (from the past 240 hour(s))  Culture, blood (routine x 2)  Status: None   Collection Time: 11/23/15  1:29 PM  Result Value Ref Range Status   Specimen Description BLOOD RIGHT ANTECUBITAL  Final   Special Requests   Final    BOTTLES DRAWN AEROBIC AND ANAEROBIC AEB=8CC ANA=6CC   Culture  Setup Time   Final    GRAM POSITIVE COCCI IN CLUSTERS RECOVERED FROM BOTH BOTTLES Gram Stain Report Called to,Read Back By and Verified With: THOMAS K AT 0406 ON [id number] BY FORSYTH K Performed at [facility]    Culture   Final    METHICILLIN RESISTANT STAPHYLOCOCCUS AUREUS Performed at [facility]    Report Status 11/26/2015 FINAL  Final   Organism ID, Bacteria METHICILLIN RESISTANT STAPHYLOCOCCUS AUREUS  Final      Susceptibility   Methicillin resistant staphylococcus aureus - MIC*    CIPROFLOXACIN >=8 RESISTANT Resistant     ERYTHROMYCIN >=8  RESISTANT Resistant     GENTAMICIN <=0.5 SENSITIVE Sensitive     OXACILLIN >=4 RESISTANT Resistant     TETRACYCLINE <=1 SENSITIVE Sensitive     VANCOMYCIN <=0.5 SENSITIVE Sensitive     TRIMETH/SULFA <=10 SENSITIVE Sensitive     CLINDAMYCIN <=0.25 SENSITIVE Sensitive     RIFAMPIN <=0.5 SENSITIVE Sensitive     Inducible Clindamycin NEGATIVE Sensitive     * METHICILLIN RESISTANT STAPHYLOCOCCUS AUREUS  MRSA PCR Screening     Status: Abnormal   Collection Time: 11/23/15  6:44 PM  Result Value Ref Range Status   MRSA by PCR POSITIVE (A) NEGATIVE Final    Comment:        The GeneXpert MRSA Assay (FDA approved for NASAL specimens only), is one component of a comprehensive MRSA colonization surveillance program. It is not intended to diagnose MRSA infection nor to guide or monitor treatment for MRSA infections. RESULT CALLED TO, READ BACK BY AND VERIFIED WITH: THOMAS K AT 2208 ON [id number] BY FORSYTH K   Culture, blood (routine x 2)     Status: None   Collection Time: 11/24/15  8:39 AM  Result Value Ref Range Status   Specimen Description RIGHT ANTECUBITAL  Final   Special Requests BOTTLES DRAWN AEROBIC ONLY 6CC  Final   Culture NO GROWTH 5 DAYS  Final   Report Status 11/29/2015 FINAL  Final  Culture, blood (x 2)     Status: None   Collection Time: 11/24/15  2:40 PM  Result Value Ref Range Status   Specimen Description BLOOD HEMODIALYSIS CATHETER VENOUS PORT  Final   Special Requests   Final    BOTTLES DRAWN AEROBIC AND ANAEROBIC AEB=10CC ANA=8CC   Culture NO GROWTH 5 DAYS  Final   Report Status 11/29/2015 FINAL  Final  Culture, blood (x 2)     Status: None   Collection Time: 11/24/15  3:30 PM  Result Value Ref Range Status   Specimen Description BLOOD HD ART PORT DRAWN BY RN  Final   Special Requests   Final    BOTTLES DRAWN AEROBIC AND ANAEROBIC AEB=9CC ANA=6CC   Culture NO GROWTH 5 DAYS  Final   Report Status 11/29/2015 FINAL  Final  Cath Tip Culture     Status: None    Collection Time: 11/26/15  9:28 AM  Result Value Ref Range Status   Specimen Description HEMODIALYSIS CATHETER  Final   Special Requests NONE  Final   Culture   Final    >100 COLONIES METHICILLIN RESISTANT STAPHYLOCOCCUS AUREUS Note: RIFAMPIN AND GENTAMICIN SHOULD NOT BE USED AS SINGLE DRUGS FOR  TREATMENT OF STAPH INFECTIONS. This organism DOES NOT demonstrate inducible Clindamycin resistance in vitro. CRITICAL RESULT CALLED TO, READ BACK BY AND VERIFIED WITH: Anson Crofts 2/13  @0900  BY REAMM Performed at [facility]    Report Status 11/29/2015 FINAL  Final   Organism ID, Bacteria METHICILLIN RESISTANT STAPHYLOCOCCUS AUREUS  Final      Susceptibility   Methicillin resistant staphylococcus aureus - MIC*    CLINDAMYCIN <=0.25 SENSITIVE Sensitive     ERYTHROMYCIN >=8 RESISTANT Resistant     GENTAMICIN <=0.5 SENSITIVE Sensitive     LEVOFLOXACIN 4 INTERMEDIATE Intermediate     OXACILLIN >=4 RESISTANT Resistant     RIFAMPIN <=0.5 SENSITIVE Sensitive     TRIMETH/SULFA <=10 SENSITIVE Sensitive     VANCOMYCIN 1 SENSITIVE Sensitive     TETRACYCLINE <=1 SENSITIVE Sensitive     * >100 COLONIES METHICILLIN RESISTANT STAPHYLOCOCCUS AUREUS     Studies:  Scheduled Meds: . emtricitabine  200 mg Oral Once per day on Mon Thu  . etravirine  200 mg Oral Q12H  . guaiFENesin  1,200 mg Oral BID  . ipratropium-albuterol  3 mL Nebulization TID  . metoCLOPramide (REGLAN) injection  5 mg Intravenous 4 times per day  . metoprolol succinate  50 mg Oral BID  . milk and molasses  1 enema Rectal Once  . pantoprazole  40 mg Oral Daily  . polyethylene glycol  17 g Oral Daily  . raltegravir  400 mg Oral BID  . sulfamethoxazole-trimethoprim  0.5 tablet Oral Once per day on Mon Wed Fri  . tenofovir  300 mg Oral Weekly  . vancomycin  500 mg Intravenous Q M,W,F-HD   Continuous Infusions: . sodium chloride 10 mL/hr (11/24/15 2308)  . dextrose 30 mL/hr at 11/28/15 1715    Active Problems:   HIV (human  immunodeficiency virus infection) (HCC)   ESRD on hemodialysis (HCC)   MRSA bacteremia   Fever   Nonischemic cardiomyopathy (HCC)    Time spent: 25 minutes   [name], MD.  [facility] Pager [phone]. If 7PM-7AM, please contact night-coverage at [email or web address], password [facility] 11/30/2015, 12:32 PM  LOS: 6 days

## 2015-11-30 NOTE — Progress Notes (Signed)
Reinforced dressing to left internal jugular dialysis cath.  Will continue to monitor.

## 2015-11-30 NOTE — Progress Notes (Signed)
Pt refused all medications. He states that he had his last dose of all medications at 6:00 p.m. And states he is taking medications too close together. He states his medications should be 12 hours apart. Educated pt and reviewed medications and times with pt. Pt verbalized understanding and refuses medications.

## 2015-11-30 NOTE — Progress Notes (Signed)
Subjective: Patient complains of some joint pain including his back. He doesn't have any nausea or vomiting.  Objective: Vital signs in last 24 hours: Temp:  [97.7 F (36.5 C)-98.2 F (36.8 C)] 98.2 F (36.8 C) (02/14 0451) Pulse Rate:  [78-88] 84 (02/14 0451) Resp:  [16-20] 18 (02/14 0451) BP: (113-133)/(63-93) 118/66 mmHg (02/14 0451) SpO2:  [95 %-99 %] 99 % (02/14 0818)  Intake/Output from previous day: 02/13 0701 - 02/14 0700 In: -  Out: 3002 [Stool:2] Intake/Output this shift:     Recent Labs  11/29/15 0932 11/29/15 1618  HGB 14.3 13.6    Recent Labs  11/29/15 0932 11/29/15 1618  WBC 15.0* 11.5*  RBC 3.84* 3.66*  HCT 41.2 40.0  PLT 149* 143*    Recent Labs  11/28/15 0949 11/29/15 1618  NA 133* 129*  129*  K 5.0 5.7*  5.7*  CL 95* 93*  93*  CO2 17* 13*  15*  BUN 61* 82*  82*  CREATININE 9.41* 11.32*  11.03*  GLUCOSE 136* 106*  109*  CALCIUM 8.1* 8.1*  8.1*    Recent Labs  11/29/15 0932  INR 1.80*    Patient is alert and in no acute distress Chest is clear to auscultation Heart exam regular rate and rhythm no murmur Extremities no edema   Assessment/Plan: Problem #1 MRSA infection. Catheter-tipped also grows MRSA. Presently patient is on vancomycin. Since his white blood cell count was high a temporary catheter was placed yesterday. Once patient's show some improvement is catheter may need to be changed into tunneled catheter possibly has an outpatient. Problem #2 end-stage renal disease: Patient is status post hemodialysis yesterday. Presently he is asymptomatic. Problem #3 hyperkalemia: His potassium was slightly high yesterday predialysis. However since patient didn't get dialysis for 3 days that potassium was expected. Problem #4 anemia: His hemoglobin is above our target goal. Patient is off  Epogen. Problem #5. Metabolic bone disease: Calcium is range. Patient presently is not on any binder. Problem #6 fluid management: Patient  does not have any significant sign of fluid overload. Problem #7 HIV positive Problem #8 hyponatremia. Most likely from freewater intake Plan: 1] we'll dialyze patient for 4 hours tomorrow 2] would remove about 3 L if his blood pressure tolerates. 3] we'll check his basic metabolic panel, phosphorus and CBC in the morning   Dave Estrada S 11/30/2015, 8:37 AM

## 2015-11-30 NOTE — Plan of Care (Signed)
Problem: Safety: Goal: Ability to remain free from injury will improve Outcome: Progressing Pt remains free of injury this shift.   Problem: Pain Managment: Goal: General experience of comfort will improve Outcome: Progressing Pt has been complaining of pain in his back Pt states the pain medication helps relieve his pain.   Problem: Physical Regulation: Goal: Ability to maintain clinical measurements within normal limits will improve Outcome: Progressing See flowsheet.

## 2015-12-01 DIAGNOSIS — Z21 Asymptomatic human immunodeficiency virus [HIV] infection status: Secondary | ICD-10-CM

## 2015-12-01 DIAGNOSIS — B9562 Methicillin resistant Staphylococcus aureus infection as the cause of diseases classified elsewhere: Secondary | ICD-10-CM

## 2015-12-01 DIAGNOSIS — I429 Cardiomyopathy, unspecified: Secondary | ICD-10-CM

## 2015-12-01 DIAGNOSIS — N186 End stage renal disease: Secondary | ICD-10-CM

## 2015-12-01 DIAGNOSIS — Z992 Dependence on renal dialysis: Secondary | ICD-10-CM

## 2015-12-01 DIAGNOSIS — R7881 Bacteremia: Secondary | ICD-10-CM

## 2015-12-01 LAB — HEPATIC FUNCTION PANEL
ALBUMIN: 2.7 g/dL — AB (ref 3.5–5.0)
ALT: 13 U/L — ABNORMAL LOW (ref 17–63)
AST: 135 U/L — ABNORMAL HIGH (ref 15–41)
Alkaline Phosphatase: 81 U/L (ref 38–126)
BILIRUBIN TOTAL: 3.6 mg/dL — AB (ref 0.3–1.2)
Bilirubin, Direct: 1.8 mg/dL — ABNORMAL HIGH (ref 0.1–0.5)
Indirect Bilirubin: 1.8 mg/dL — ABNORMAL HIGH (ref 0.3–0.9)
TOTAL PROTEIN: 6.5 g/dL (ref 6.5–8.1)

## 2015-12-01 LAB — BASIC METABOLIC PANEL
ANION GAP: 14 (ref 5–15)
BUN: 47 mg/dL — ABNORMAL HIGH (ref 6–20)
CALCIUM: 8.2 mg/dL — AB (ref 8.9–10.3)
CO2: 24 mmol/L (ref 22–32)
Chloride: 97 mmol/L — ABNORMAL LOW (ref 101–111)
Creatinine, Ser: 8.92 mg/dL — ABNORMAL HIGH (ref 0.61–1.24)
GFR, EST AFRICAN AMERICAN: 8 mL/min — AB (ref >60)
GFR, EST NON AFRICAN AMERICAN: 7 mL/min — AB (ref >60)
Glucose, Bld: 109 mg/dL — ABNORMAL HIGH (ref 65–99)
POTASSIUM: 3.6 mmol/L (ref 3.5–5.1)
Sodium: 135 mmol/L (ref 135–145)

## 2015-12-01 LAB — GLUCOSE, CAPILLARY
GLUCOSE-CAPILLARY: 116 mg/dL — AB (ref 65–99)
GLUCOSE-CAPILLARY: 117 mg/dL — AB (ref 65–99)
Glucose-Capillary: 109 mg/dL — ABNORMAL HIGH (ref 65–99)
Glucose-Capillary: 89 mg/dL (ref 65–99)

## 2015-12-01 LAB — PHOSPHORUS: PHOSPHORUS: 5.1 mg/dL — AB (ref 2.5–4.6)

## 2015-12-01 LAB — CBC
HEMATOCRIT: 36.4 % — AB (ref 39.0–52.0)
HEMOGLOBIN: 12.6 g/dL — AB (ref 13.0–17.0)
MCH: 37 pg — ABNORMAL HIGH (ref 26.0–34.0)
MCHC: 34.6 g/dL (ref 30.0–36.0)
MCV: 106.7 fL — ABNORMAL HIGH (ref 78.0–100.0)
PLATELETS: 113 10*3/uL — AB (ref 150–400)
RBC: 3.41 MIL/uL — AB (ref 4.22–5.81)
RDW: 15.8 % — AB (ref 11.5–15.5)
WBC: 8.3 10*3/uL (ref 4.0–10.5)

## 2015-12-01 LAB — LIPASE, BLOOD: LIPASE: 307 U/L — AB (ref 11–51)

## 2015-12-01 LAB — VANCOMYCIN, RANDOM: Vancomycin Rm: 19 ug/mL

## 2015-12-01 MED ORDER — SODIUM CHLORIDE 0.9% FLUSH
10.0000 mL | INTRAVENOUS | Status: DC | PRN
  Administered 2015-12-01: 10 mL
  Filled 2015-12-01: qty 10

## 2015-12-01 MED ORDER — HEPARIN SODIUM (PORCINE) 1000 UNIT/ML IJ SOLN
INTRAMUSCULAR | Status: AC
  Administered 2015-12-01: 2800 [IU] via INTRAVENOUS_CENTRAL
  Filled 2015-12-01: qty 5

## 2015-12-01 MED ORDER — IPRATROPIUM-ALBUTEROL 0.5-2.5 (3) MG/3ML IN SOLN: 3.0000 mL | Freq: Four times a day (QID) | RESPIRATORY_TRACT | Status: DC | PRN

## 2015-12-01 MED ORDER — SODIUM CHLORIDE 0.9 % IV SOLN: 100.0000 mL | INTRAVENOUS | Status: DC | PRN

## 2015-12-01 MED ORDER — SODIUM CHLORIDE 0.9% FLUSH
INTRAVENOUS | Status: AC
  Administered 2015-12-01: 10 mL
  Filled 2015-12-01: qty 60

## 2015-12-01 MED ORDER — POLYETHYLENE GLYCOL 3350 17 G PO PACK
17.0000 g | PACK | Freq: Three times a day (TID) | ORAL | Status: DC
  Administered 2015-12-01: 17 g via ORAL
  Filled 2015-12-01 (×2): qty 1

## 2015-12-01 MED ORDER — BISACODYL 5 MG PO TBEC
10.0000 mg | DELAYED_RELEASE_TABLET | Freq: Once | ORAL | Status: AC
  Administered 2015-12-01: 10 mg via ORAL
  Filled 2015-12-01: qty 2

## 2015-12-01 NOTE — Progress Notes (Signed)
KACE HARTJE  MRN: [id number]  DOB/AGE: 1991-09-04 25 y.o.  Primary Care Physician:JASON [name], MD  Admit date: 11/23/2015  Chief Complaint:  Chief Complaint  Patient presents with  . Fever    S-Pt presented on  11/23/2015 with  Chief Complaint  Patient presents with  . Fever  .    Pt today feels better  Meds . emtricitabine  200 mg Oral Once per day on Mon Thu  . etravirine  200 mg Oral Q12H  . guaiFENesin  1,200 mg Oral BID  . ipratropium-albuterol  3 mL Nebulization TID  . metoCLOPramide (REGLAN) injection  5 mg Intravenous 4 times per day  . metoprolol succinate  50 mg Oral BID  . milk and molasses  1 enema Rectal Once  . pantoprazole  40 mg Oral Daily  . polyethylene glycol  17 g Oral Daily  . raltegravir  400 mg Oral BID  . sulfamethoxazole-trimethoprim  0.5 tablet Oral Once per day on Mon Wed Fri  . tenofovir  300 mg Oral Weekly  . vancomycin  500 mg Intravenous Q M,W,F-HD        Physical Exam: Vital signs in last 24 hours: Temp:  [98.2 F (36.8 C)-98.5 F (36.9 C)] 98.2 F (36.8 C) (02/15 0622) Pulse Rate:  [78-93] 78 (02/15 0622) Resp:  [18] 18 (02/15 0622) BP: (115-122)/(64-88) 122/64 mmHg (02/15 0622) SpO2:  [98 %-100 %] 100 % (02/15 0622) Weight change:  Last BM Date: 11/29/15  Intake/Output from previous day: 02/14 0701 - 02/15 0700 In: 720 [P.O.:720] Out: 3 [Urine:3]     Physical Exam: General- pt is awake,alert, oriented to time place and person Resp- No acute REsp distress, CTA B/L NO Rhonchi CVS- S1S2 regular in rate and rhythm GIT- BS+, soft, NT, ND EXT- NO LE Edema, Cyanosis Access-  Left IJ  Lab Results: CBC  Recent Labs  11/29/15 1618 12/01/15 0706  WBC 11.5* 8.3  HGB 13.6 12.6*  HCT 40.0 36.4*  PLT 143* 113*    BMET  Recent Labs  11/29/15 1618 12/01/15 0706  NA 129*  129* 135  K 5.7*  5.7* 3.6  CL 93*  93* 97*  CO2 13*  15* 24  GLUCOSE 106*  109* 109*  BUN 82*  82* 47*  CREATININE 11.32*   11.03* 8.92*  CALCIUM 8.1*  8.1* 8.2*    MICRO Recent Results (from the past 240 hour(s))  Culture, blood (routine x 2)     Status: None   Collection Time: 11/23/15  1:29 PM  Result Value Ref Range Status   Specimen Description BLOOD RIGHT ANTECUBITAL  Final   Special Requests   Final    BOTTLES DRAWN AEROBIC AND ANAEROBIC AEB=8CC ANA=6CC   Culture  Setup Time   Final    GRAM POSITIVE COCCI IN CLUSTERS RECOVERED FROM BOTH BOTTLES Gram Stain Report Called to,Read Back By and Verified With: THOMAS K AT 0406 ON [id number] BY FORSYTH K Performed at [facility]    Culture   Final    METHICILLIN RESISTANT STAPHYLOCOCCUS AUREUS Performed at [facility]    Report Status 11/26/2015 FINAL  Final   Organism ID, Bacteria METHICILLIN RESISTANT STAPHYLOCOCCUS AUREUS  Final      Susceptibility   Methicillin resistant staphylococcus aureus - MIC*    CIPROFLOXACIN >=8 RESISTANT Resistant     ERYTHROMYCIN >=8 RESISTANT Resistant     GENTAMICIN <=0.5 SENSITIVE Sensitive     OXACILLIN >=4 RESISTANT Resistant  TETRACYCLINE <=1 SENSITIVE Sensitive     VANCOMYCIN <=0.5 SENSITIVE Sensitive     TRIMETH/SULFA <=10 SENSITIVE Sensitive     CLINDAMYCIN <=0.25 SENSITIVE Sensitive     RIFAMPIN <=0.5 SENSITIVE Sensitive     Inducible Clindamycin NEGATIVE Sensitive     * METHICILLIN RESISTANT STAPHYLOCOCCUS AUREUS  MRSA PCR Screening     Status: Abnormal   Collection Time: 11/23/15  6:44 PM  Result Value Ref Range Status   MRSA by PCR POSITIVE (A) NEGATIVE Final    Comment:        The GeneXpert MRSA Assay (FDA approved for NASAL specimens only), is one component of a comprehensive MRSA colonization surveillance program. It is not intended to diagnose MRSA infection nor to guide or monitor treatment for MRSA infections. RESULT CALLED TO, READ BACK BY AND VERIFIED WITH: THOMAS K AT 2208 ON [id number] BY FORSYTH K   Culture, blood (routine x 2)     Status: None   Collection Time:  11/24/15  8:39 AM  Result Value Ref Range Status   Specimen Description RIGHT ANTECUBITAL  Final   Special Requests BOTTLES DRAWN AEROBIC ONLY 6CC  Final   Culture NO GROWTH 5 DAYS  Final   Report Status 11/29/2015 FINAL  Final  Culture, blood (x 2)     Status: None   Collection Time: 11/24/15  2:40 PM  Result Value Ref Range Status   Specimen Description BLOOD HEMODIALYSIS CATHETER VENOUS PORT  Final   Special Requests   Final    BOTTLES DRAWN AEROBIC AND ANAEROBIC AEB=10CC ANA=8CC   Culture NO GROWTH 5 DAYS  Final   Report Status 11/29/2015 FINAL  Final  Culture, blood (x 2)     Status: None   Collection Time: 11/24/15  3:30 PM  Result Value Ref Range Status   Specimen Description BLOOD HD ART PORT DRAWN BY RN  Final   Special Requests   Final    BOTTLES DRAWN AEROBIC AND ANAEROBIC AEB=9CC ANA=6CC   Culture NO GROWTH 5 DAYS  Final   Report Status 11/29/2015 FINAL  Final  Cath Tip Culture     Status: None   Collection Time: 11/26/15  9:28 AM  Result Value Ref Range Status   Specimen Description HEMODIALYSIS CATHETER  Final   Special Requests NONE  Final   Culture   Final    >100 COLONIES METHICILLIN RESISTANT STAPHYLOCOCCUS AUREUS Note: RIFAMPIN AND GENTAMICIN SHOULD NOT BE USED AS SINGLE DRUGS FOR TREATMENT OF STAPH INFECTIONS. This organism DOES NOT demonstrate inducible Clindamycin resistance in vitro. CRITICAL RESULT CALLED TO, READ BACK BY AND VERIFIED WITH: Anson Crofts 2/13  @0900  BY REAMM Performed at [facility]    Report Status 11/29/2015 FINAL  Final   Organism ID, Bacteria METHICILLIN RESISTANT STAPHYLOCOCCUS AUREUS  Final      Susceptibility   Methicillin resistant staphylococcus aureus - MIC*    CLINDAMYCIN <=0.25 SENSITIVE Sensitive     ERYTHROMYCIN >=8 RESISTANT Resistant     GENTAMICIN <=0.5 SENSITIVE Sensitive     LEVOFLOXACIN 4 INTERMEDIATE Intermediate     OXACILLIN >=4 RESISTANT Resistant     RIFAMPIN <=0.5 SENSITIVE Sensitive     TRIMETH/SULFA  <=10 SENSITIVE Sensitive     VANCOMYCIN 1 SENSITIVE Sensitive     TETRACYCLINE <=1 SENSITIVE Sensitive     * >100 COLONIES METHICILLIN RESISTANT STAPHYLOCOCCUS AUREUS      Lab Results  Component Value Date   CALCIUM 8.2* 12/01/2015   PHOS 5.1* 12/01/2015  Impression: 1)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  Pt is On Tues/Thurs/Satur schedule as outpt  As inpt last HD session was on Monday  Will dialyze today  2)HTN BP stable   3)Anemia In ESRD the goal for HGb is 9--11. NO need of Epo   4)CKD Mineral-Bone Disorder Calcium Near to goal when corrected for low albumin Phos not at goal      Non adherent to medications  5)ID- hx of HIV Admitted with fever Blood culture positive for G Positive cocci It showed MRSA Cath tip also was MRSA Pt with temporary cath Pt will need PC replacement . On IV vanco    6)Electrolytes  Hyperkalemic     Better  Hypoonatremic     Better  7)Acid base Co2 now at goal NON AG acidosis sec to ESRD    Plan:  Will dialyze today Pt wbc now better Will ask for tunneled cath in am if possible  Addendum Pt seen on HD Pt tolerating tx well. I did discuss the need of tunneled cath with primary team. Pt will have to go to [facility] intervention[facility] radiology for placement   Rutledge Selsor S 12/01/2015, 8:59 AM

## 2015-12-01 NOTE — Progress Notes (Signed)
PROGRESS NOTE  Dave Estrada WGN:[id number] DOB: 06-18-1991 DOA: 11/23/2015 PCP: [name] [name], MD  Summary: 78 yom with a hx of HIV and  ESRD on hemodialysis, presents with fever and sepsis. He was found to have MRSA bacteremia likely due to infected dialysis catheter. Patient was started on abx and catheter was removed 2/11. A new catheter was placed 2.13. His course was complicated by persistent hypoglycemia, but has improved with D10 infusions. Hypoglycemia may have been the result of decreased PO intake due to nausea, but since undergoing hemodialysis, his nausea and vomiting appear to have improved.   Assessment/Plan: 1. Sepsis due to MRSA bacteremia, resolved. Repeat blood cultures 2/8 no growth, final. No evidence of pneumonia or cellulitis. Secondary to line infection. MRSA was found on catheter tip. 2. HIV/AIDS. Continue antiretroviral therapy. 3. End-stage renal disease. Completed dialysis today. Temporary catheter in place. This was placed because of elevated white blood cell count. Will need tunneled catheter in the near future. 4. Nonischemic cardiomyopathy, LVEF 20-25 percent. Bilateral lower extremity edema noted. 5. Hypoglycemia, likely secondary to poor oral intake. Resolved. 6. Hyperkalemia. Resolved. ACE inhibitor discontinued. 7. Epigastric and generalized abdominal pain. Unclear etiology. No bowel movement reported in several days. Possibly related to constipation. Consider pancreatitis.  8. Elevated lipase and LFTs of unclear significance. CT abdomen and pelvis 2/13 with unremarkable appearance of the liver and pancreas. Consider pancreatitis. Consider biliary source. May be related to end-stage renal disease. 9. Chronic back pain. Appears stable at this point. 10. Thrombocytopenia.   Overall appears stable. Plan to continue Vancomycin until 01/05/16. No vegetation seen on TTE.  Repeat CMP and CBC in the morning. GI consultation for further recommendations. Check  abdominal ultrasound.  Bowel regimen.  Continue anti-retroviral therapy   Code Status: Full DVT prophylaxis: SCD's Family Communication: none  Disposition Plan: Anticipate discharge when improved  Brendia Sacks, MD  [facility]  Pager [phone] If 7PM-7AM, please contact night-coverage at [email or web address], password [facility] 12/01/2015, 6:54 AM  LOS: 7 days   Consultants:  Nephro  GI  Procedures:  Removal of permacath 2/10 by general surgery  2/13 Placement of left jugular trialysis catheter. Tip at SVC/RA junction. Ready to use. No immediate complication  ECHO Study Conclusions  - Left ventricle: The cavity size was moderately dilated. Wall thickness was normal. Systolic function was severely reduced. The estimated ejection fraction was in the range of 20% to 25%. Diffuse hypokinesis. Features are consistent with a pseudonormal left ventricular filling pattern, with concomitant abnormal relaxation and increased filling pressure (grade 2 diastolic dysfunction). Doppler parameters are consistent with high ventricular filling pressure. 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 There was moderate to severe regurgitation. - Left atrium: The atrium was moderately dilated. - Right ventricle: The cavity size was mildly dilated. Systolic function was severely reduced. - Right atrium: The atrium was moderately dilated. - Tricuspid valve: There was moderate regurgitation. - Pulmonary arteries: Systolic pressure was mildly increased. PA peak pressure: 39 mm Hg (S). - Pericardium, extracardiac: A small pericardial effusion was identified.  Antibiotics:  Vancomycin 2/7 >>  Zosyn 2/7 >> 2/9  Ancef 2/7>>2/10  HPI/Subjective: Main complaint is some mild abdominal pain and back pain. Has chronic back pain but this seems to be worse. He's been able to tolerate some food. Some nausea. No  vomiting.  Objective: Filed Vitals:   11/30/15 1432 11/30/15 1434 11/30/15 2005 12/01/15 0622  BP:  115/88 120/74 122/64  Pulse:  90 88  78  Temp:   98.5 F (36.9 C) 98.2 F (36.8 C)  TempSrc:   Oral Oral  Resp:  18 18 18   Height:      Weight:      SpO2: 98% 100% 100% 100%    Intake/Output Summary (Last 24 hours) at 12/01/15 0654 Last data filed at 11/30/15 1915  Gross per 24 hour  Intake    720 ml  Output      3 ml  Net    717 ml     Filed Weights   11/26/15 0521 11/27/15 0729 11/28/15 0606  Weight: 57.244 kg (126 lb 3.2 oz) 57.471 kg (126 lb 11.2 oz) 56.201 kg (123 lb 14.4 oz)    Exam:    Afebrile, VSS, not hypoxic General:  Appears calm and comfortable, sitting in chair eating dinner Cardiovascular: RRR, no m/r/g 2+ bilateral lower extremity edema. Telemetry: SR, no arrhythmias  Respiratory: CTA bilaterally, no w/r/r. Normal respiratory effort. Abdomen: soft, distended, mild generalized tenderness, predominantly epigastric Psychiatric: grossly normal mood and affect, speech fluent and appropriate  New data reviewed:  Basic metabolic panel consistent with end-stage renal disease. Potassium normal. Lipase slightly elevated through 7. AST decreased, 135. ALT unremarkable. Total bilirubin without significant change: 3.6  Platelet count 113. Remainder CBC unremarkable.  Pending data:  none  Scheduled Meds: . emtricitabine  200 mg Oral Once per day on Mon Thu  . etravirine  200 mg Oral Q12H  . guaiFENesin  1,200 mg Oral BID  . ipratropium-albuterol  3 mL Nebulization TID  . metoCLOPramide (REGLAN) injection  5 mg Intravenous 4 times per day  . metoprolol succinate  50 mg Oral BID  . milk and molasses  1 enema Rectal Once  . pantoprazole  40 mg Oral Daily  . polyethylene glycol  17 g Oral Daily  . raltegravir  400 mg Oral BID  . sulfamethoxazole-trimethoprim  0.5 tablet Oral Once per day on Mon Wed Fri  . tenofovir  300 mg Oral Weekly  . vancomycin  500 mg  Intravenous Q M,W,F-HD   Continuous Infusions: . sodium chloride 10 mL/hr (11/24/15 2308)    Active Problems:   HIV (human immunodeficiency virus infection) (HCC)   ESRD on hemodialysis (HCC)   MRSA bacteremia   Fever   Nonischemic cardiomyopathy (HCC)   Time spent 25 minutes  By signing my name below, I, Adron Bene, attest that this documentation has been prepared under the direction and in the presence of Amdrew Oboyle P. Irene Limbo, MD. Electronically Signed: Adron Bene, Scribe.  12/01/2015  10:45am   I personally performed the services described in this documentation. All medical record entries made by the scribe were at my direction. I have reviewed the chart and agree that the record reflects my personal performance and is accurate and complete. Brendia Sacks, MD

## 2015-12-01 NOTE — Progress Notes (Signed)
Pharmacy Antibiotic Note  Dave Estrada is a 25 y.o. male admitted on 11/23/2015 with bacteremia and fever.  Pharmacy consulted for Vancomycin MRSA bacteremia Presently is afebrile. Patient tx narrowed to Vancomycin. Source of infection at this moment is not clear however possibility of induced infection from his dialysis catheter which was removed 2/10. ID [facility] team assisting with recommendations. Pre-HD Vancomycin level this morning @ goal. Plan: Continue Vancomycin 500mg  IV after every HD(usually TTHSat, now MWF), planning on 4-6 weeks of IV vancomycin  Height: 5\' 5"  (165.1 cm) Weight: 123 lb 14.4 oz (56.201 kg) IBW/kg (Calculated) : 61.5  Temp (24hrs), Avg:98.4 F (36.9 C), Min:98.2 F (36.8 C), Max:98.5 F (36.9 C)   Recent Labs Lab 11/24/15 1440 11/24/15 1737 11/25/15 0100  11/25/15 0655 11/26/15 0651 11/27/15 0657 11/28/15 0949 11/29/15 0932 11/29/15 1618 12/01/15 0706  WBC  --   --   --   --  10.3 7.1  --   --  15.0* 11.5* 8.3  CREATININE  --   --   --   < > 7.94* 6.07* 7.40* 9.41*  --  11.32*  11.03* 8.92*  LATICACIDVEN 9.3* 2.7* 6.8*  --  6.7*  --   --   --   --   --   --   VANCORANDOM  --   --   --   --   --   --   --   --   --   --  19  < > = values in this interval not displayed.  Estimated Creatinine Clearance: 10 mL/min (by C-G formula based on Cr of 8.92).    Allergies  Allergen Reactions  . Bee Venom Anaphylaxis  . Other Anaphylaxis    mushrooms  . Aspirin Other (See Comments)    Reaction is unknown    Antimicrobials this admission: Vancomcyin 11/23/15  >>  Zosyn 11/23/15 >>12/01/2015  Ancef 11/25/15 >>11/26/2015  Dose adjustments this admission: Currently n/a  Microbiology results: 2/7 BCx: MRSA  2/8 Repeat BCx: negative 2/7 MRSA PCR: (+) 2/10 Cath tip + MRSA.   Thank you for allowing pharmacy to be a part of this patient's care. Mady Gemma, [facility]   12/01/2015 8:49 AM

## 2015-12-01 NOTE — Procedures (Signed)
   HEMODIALYSIS TREATMENT NOTE:  4 hour heparin-free dialysis completed via left IJ temporary/non-tunneled catheter. Exit site unremarkable. Goal met: 3 liters removed without interruption in ultrafiltration. Vancomycin 57m given at end of HD session. Hemodynamically stable. All blood was returned. Pt expressed concerns about being discharged with temporary catheter in place. "I can't go out on the street with this thing sticking out my neck like this."  Inquired about plans for permanent catheter placement. Pt's concerns were communicated to Dr. BTheador Hawthorne [facility]  ARockwell Alexandria RN, CDN

## 2015-12-02 ENCOUNTER — Inpatient Hospital Stay (HOSPITAL_COMMUNITY): Payer: Medicare Other

## 2015-12-02 DIAGNOSIS — R109 Unspecified abdominal pain: Secondary | ICD-10-CM

## 2015-12-02 DIAGNOSIS — R7989 Other specified abnormal findings of blood chemistry: Secondary | ICD-10-CM

## 2015-12-02 DIAGNOSIS — A419 Sepsis, unspecified organism: Secondary | ICD-10-CM

## 2015-12-02 DIAGNOSIS — B20 Human immunodeficiency virus [HIV] disease: Secondary | ICD-10-CM

## 2015-12-02 DIAGNOSIS — A4101 Sepsis due to Methicillin susceptible Staphylococcus aureus: Secondary | ICD-10-CM

## 2015-12-02 LAB — COMPREHENSIVE METABOLIC PANEL
ALBUMIN: 2.8 g/dL — AB (ref 3.5–5.0)
ALK PHOS: 99 U/L (ref 38–126)
ALT: 10 U/L — ABNORMAL LOW (ref 17–63)
ANION GAP: 14 (ref 5–15)
AST: 100 U/L — ABNORMAL HIGH (ref 15–41)
BUN: 30 mg/dL — ABNORMAL HIGH (ref 6–20)
CALCIUM: 7.9 mg/dL — AB (ref 8.9–10.3)
CO2: 25 mmol/L (ref 22–32)
Chloride: 97 mmol/L — ABNORMAL LOW (ref 101–111)
Creatinine, Ser: 6.38 mg/dL — ABNORMAL HIGH (ref 0.61–1.24)
GFR calc Af Amer: 13 mL/min — ABNORMAL LOW (ref >60)
GFR calc non Af Amer: 11 mL/min — ABNORMAL LOW (ref >60)
GLUCOSE: 99 mg/dL (ref 65–99)
Potassium: 3.6 mmol/L (ref 3.5–5.1)
SODIUM: 136 mmol/L (ref 135–145)
Total Bilirubin: 3.4 mg/dL — ABNORMAL HIGH (ref 0.3–1.2)
Total Protein: 6.8 g/dL (ref 6.5–8.1)

## 2015-12-02 LAB — CBC
HEMATOCRIT: 36.1 % — AB (ref 39.0–52.0)
HEMOGLOBIN: 12.7 g/dL — AB (ref 13.0–17.0)
MCH: 37.7 pg — AB (ref 26.0–34.0)
MCHC: 35.2 g/dL (ref 30.0–36.0)
MCV: 107.1 fL — ABNORMAL HIGH (ref 78.0–100.0)
Platelets: 131 10*3/uL — ABNORMAL LOW (ref 150–400)
RBC: 3.37 MIL/uL — ABNORMAL LOW (ref 4.22–5.81)
RDW: 16 % — ABNORMAL HIGH (ref 11.5–15.5)
WBC: 8 10*3/uL (ref 4.0–10.5)

## 2015-12-02 LAB — GLUCOSE, CAPILLARY
Glucose-Capillary: 108 mg/dL — ABNORMAL HIGH (ref 65–99)
Glucose-Capillary: 121 mg/dL — ABNORMAL HIGH (ref 65–99)

## 2015-12-02 LAB — LIPASE, BLOOD: Lipase: 150 U/L — ABNORMAL HIGH (ref 11–51)

## 2015-12-02 NOTE — Progress Notes (Signed)
Patient states he refuses all future accuchecks

## 2015-12-02 NOTE — Progress Notes (Signed)
PROGRESS NOTE  Dave Estrada BMW:[id number] DOB: 09-27-1991 DOA: 11/23/2015 PCP: [name] [name], MD  Summary: 72 yom with a hx of HIV and  ESRD on hemodialysis, presents with fever and sepsis. He was found to have MRSA bacteremia likely due to infected dialysis catheter. Patient was started on abx and catheter was removed 2/11. A new catheter was placed 2.13. His course was complicated by persistent hypoglycemia, but has improved with D10 infusions. Hypoglycemia may have been the result of decreased PO intake due to nausea, but since undergoing hemodialysis, his nausea and vomiting appear to have improved.   Assessment/Plan: 1. Sepsis due to MRSA bacteremia. Sepsis resolved. Source catheter. No evidence of pneumonia or cellulitis. 2. MRSA bacteremia. Secondary to line infection. Continues to improve. Afebrile. No leukocytosis. 3. End-stage renal disease. Stable. 4. Analysis 2/17. Temporary catheter in place. Plan for tunneled catheter placement 2/20. 5. HIV/AIDS. Stable. Continue antiretroviral therapy. 6. End-stage renal disease. Completed dialysis 2/15. Temporary catheter in place. This was placed because of elevated white blood cell count. Will need tunneled catheter in the near future, possibly 2/20. 7. Nonischemic cardiomyopathy, LVEF 20-25 percent. Stable. 8. Hypoglycemia, likely secondary to poor oral intake. Resolved. 9. Hyperkalemia. Resolved. ACE inhibitor discontinued. 10. Epigastric and generalized abdominal pain, resolved. Likely related to constipation. Significance of elevated LFTs unclear. 11. Elevated lipase and LFTs of unclear significance. No abdominal pain. Lipase, AST trending down. Total bilirubin trending down. CT abdomen and pelvis 2/13 with unremarkable appearance of the liver and pancreas. Abdominal US shows minimal ascites and nonspecific gallbladder wall thickening.   12. Chronic back pain. Appears stable at this point. Likely aggravated by  bed. 13. Thrombocytopenia, trending up.   Overall appears stable. Plan to continue Vancomycin until 01/05/16. No vegetation seen on TTE.  Follow up GI recommendations.   Continue anti-retroviral therapy.  Consider discharge tomorrow if continues to improve.   Plan for a tunneled dialysis catheter on 2/20.   Code Status: Full DVT prophylaxis: SCD's Family Communication: none  Disposition Plan: Consider discharge tomorrow if continues to improve.   Dave Sacks, MD  [facility]  Pager [phone] If 7PM-7AM, please contact night-coverage at [email or web address], password [facility] 12/02/2015, 7:25 AM  LOS: 8 days   Consultants:  Nephro  GI  Procedures:  Removal of permacath 2/10 by general surgery  2/13 Placement of left jugular trialysis catheter. Tip at SVC/RA junction. Ready to use. No immediate complication  ECHO Study Conclusions  - Left ventricle: The cavity size was moderately dilated. Wall thickness was normal. Systolic function was severely reduced. The estimated ejection fraction was in the range of 20% to 25%. Diffuse hypokinesis. Features are consistent with a pseudonormal left ventricular filling pattern, with concomitant abnormal relaxation and increased filling pressure (grade 2 diastolic dysfunction). Doppler parameters are consistent with high ventricular filling pressure. 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 There was moderate to severe regurgitation. - Left atrium: The atrium was moderately dilated. - Right ventricle: The cavity size was mildly dilated. Systolic function was severely reduced. - Right atrium: The atrium was moderately dilated. - Tricuspid valve: There was moderate regurgitation. - Pulmonary arteries: Systolic pressure was mildly increased. PA peak pressure: 39 mm Hg (S). - Pericardium, extracardiac: A small pericardial effusion  was identified.  Antibiotics:  Vancomycin 2/7 >>  Zosyn 2/7 >> 2/9  Ancef 2/7>>2/10  HPI/Subjective: Feels well. Had a decent size BM this morning. Denies any abdominal pain, nausea, or vomiting. Still has some back  pain which he thinks is from the bed.   Objective: Filed Vitals:   12/01/15 1518 12/01/15 1530 12/01/15 2147 12/02/15 0633  BP: 108/77 112/79 118/76 98/70  Pulse: 89 88    Temp:  97.9 F (36.6 C) 98.3 F (36.8 C) 98 F (36.7 C)  TempSrc:  Oral Oral Oral  Resp:  16 18 18   Height:      Weight:    54.931 kg (121 lb 1.6 oz)  SpO2:   98% 98%    Intake/Output Summary (Last 24 hours) at 12/02/15 0725 Last data filed at 12/01/15 1518  Gross per 24 hour  Intake    240 ml  Output   3100 ml  Net  -2860 ml     Filed Weights   11/27/15 0729 11/28/15 0606 12/02/15 0633  Weight: 57.471 kg (126 lb 11.2 oz) 56.201 kg (123 lb 14.4 oz) 54.931 kg (121 lb 1.6 oz)    Exam:    Afebrile, VSS, not hypoxic General: Appears comfortable, calm. Cardiovascular: Regular rate and rhythm, no murmur, rub or gallop. No lower extremity edema. Respiratory: Clear to auscultation bilaterally, no wheezes, rales or rhonchi. Normal respiratory effort. Abdomen: soft, ntnd Psychiatric: grossly normal mood and affect, speech fluent and appropriate  New data reviewed:  Basic metabolic panel consistent with end-stage renal disease. Potassium normal.   Lipase down to 150. AST decreased, 135. ALT unremarkable. Total bilirubin without significant change: 3.4  Platelets up to 131. WBC 8.0.  Pending data:  none  Scheduled Meds: . emtricitabine  200 mg Oral Once per day on Mon Thu  . etravirine  200 mg Oral Q12H  . guaiFENesin  1,200 mg Oral BID  . metoprolol succinate  50 mg Oral BID  . milk and molasses  1 enema Rectal Once  . pantoprazole  40 mg Oral Daily  . polyethylene glycol  17 g Oral TID  . raltegravir  400 mg Oral BID  . sulfamethoxazole-trimethoprim  0.5 tablet Oral Once  per day on Mon Wed Fri  . tenofovir  300 mg Oral Weekly  . vancomycin  500 mg Intravenous Q M,W,F-HD   Continuous Infusions: . sodium chloride 10 mL/hr (11/24/15 2308)    Active Problems:   HIV (human immunodeficiency virus infection) (HCC)   ESRD on hemodialysis (HCC)   MRSA bacteremia   Fever   Nonischemic cardiomyopathy (HCC)   Time spent 25 minutes  By signing my name below, I, Burnett Harry attest that this documentation has been prepared under the direction and in the presence of Dave Sacks, MD Electronically signed: Burnett Harry, Scribe.  12/02/2015 2:50pm  I personally performed the services described in this documentation. All medical record entries made by the scribe were at my direction. I have reviewed the chart and agree that the record reflects my personal performance and is accurate and complete. Dave Sacks, MD

## 2015-12-02 NOTE — Care Management Note (Signed)
Case Management Note  Patient Details  Name: Dave Estrada MRN: [id number] Date of Birth: November 18, 1990  Expected Discharge Date:                  Expected Discharge Plan:  Home/Self Care  In-House Referral:  NA  Discharge planning Services  CM Consult  Post Acute Care Choice:    Choice offered to:     DME Arranged:    DME Agency:     HH Arranged:    HH Agency:     Status of Service:  In process, will continue to follow  Medicare Important Message Given:  Yes Date Medicare IM Given:    Medicare IM give by:    Date Additional Medicare IM Given:    Additional Medicare Important Message give by:     If discussed at Long Length of Stay Meetings, dates discussed:    Additional Comments: Pt receives HD T/Th/Sa from [facility] in [place] HD center contacted and made aware pt would need IV abx with HD after DC from hospital. At DC pt will need abx order and DC summary faxed to Lupita Leash at [phone]. [facility] HD center will need to be called to verify receipt of this information prior to DC to ensure they may accommodate pt's needs.   Malcolm Metro, RN 12/02/2015, 2:45 PM

## 2015-12-02 NOTE — Consult Note (Signed)
Reason for Consult: elevated liver enzymes Referring Physician: Hospitalist  Dave Estrada is an 25 y.o. male.  Admitted thru the ED a week ago with fever and sepsis.  He was found to have [place] likely due to infected dialysis catheter.  Catheter replaced in [place] 2 days ago.  Hx of HIV since birth.   We were asked to see patient due to elevated liver enzymes.  Patient states his liver enzymes have been elevated before. No hx of IV drugs.  Has 8-9 tattoos.  He says he has been tested for Hep C and was negative.    Hx of ESRD and takes dialysis T-Thur-Saturday.  Has been been on dialysis since 2014. States he had an MI in 2013 and hospitalized in [place].  He denies any pain at this time.  When he has abdominal pain he describes as epigastric radiating into his back.  No prior hx pancreatitis. Appetite has been okay. No weight loss.  BMs are normal. No melena or BRRB CBC    Component Value Date/Time   WBC 8.3 12/01/2015 0706   RBC 3.41* 12/01/2015 0706   HGB 12.6* 12/01/2015 0706   HCT 36.4* 12/01/2015 0706   PLT 113* 12/01/2015 0706   MCV 106.7* 12/01/2015 0706   MCH 37.0* 12/01/2015 0706   MCHC 34.6 12/01/2015 0706   RDW 15.8* 12/01/2015 0706   LYMPHSABS 0.6* 11/23/2015 1325   MONOABS 0.5 11/23/2015 1325   EOSABS 0.0 11/23/2015 1325   BASOSABS 0.0 11/23/2015 1325      Hepatic Function Panel     Component Value Date/Time   PROT 6.8 12/02/2015 0532   ALBUMIN 2.8* 12/02/2015 0532   AST 100* 12/02/2015 0532   ALT 10* 12/02/2015 0532   ALKPHOS 99 12/02/2015 0532   BILITOT 3.4* 12/02/2015 0532   BILIDIR 1.8* 12/01/2015 0706   IBILI 1.8* 12/01/2015 0706     Past Medical History  Diagnosis Date  . HIV (human immunodeficiency virus infection) ([place])   . ESRD (end stage renal disease) ([place])   . Staphylococcus aureus bacteremia with sepsis ([place]) 09/09/2014  . A-V fistula ([place])     Placed on 10/27/2014 at [facility]; for future HD use.   . 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 Asthma     Past Surgical History  Procedure Laterality Date  . Port a cath revision    . Tee without cardioversion N/A 09/14/2014    Procedure: TRANSESOPHAGEAL ECHOCARDIOGRAM (TEE);  Surgeon: Larey Dresser, MD;  Location: [facility] ENDOSCOPY;  Service: Cardiovascular;  Laterality: N/A;  will be at [facility] by mon  . Insertion of dialysis catheter Left 09/15/2014    Procedure: INSERTION OF DIALYSIS CATHETER;  Surgeon: Serafina Mitchell, MD;  Location: [facility] OR;  Service: Vascular;  Laterality: Left;    Family History  Problem Relation Age of Onset  . Seizures Father     Social History:  reports that he has been smoking Cigarettes.  He started smoking about 12 years ago. He has a 11 pack-year smoking history. He has never used smokeless tobacco. He reports that he uses illicit drugs (Marijuana). He reports that he does not drink alcohol.  Allergies:  Allergies  Allergen Reactions  . Bee Venom Anaphylaxis  . Other Anaphylaxis    mushrooms  . Aspirin Other (See Comments)    Reaction is unknown    Medications: I have reviewed the patient's current medications.  Results for orders placed or performed  during the hospital encounter of 11/23/15 (from the past 48 hour(s))  Glucose, capillary     Status: Abnormal   Collection Time: 11/30/15 10:55 AM  Result Value Ref Range   Glucose-Capillary 137 (H) 65 - 99 mg/dL   Comment 1 Notify RN   Glucose, capillary     Status: Abnormal   Collection Time: 11/30/15  5:03 PM  Result Value Ref Range   Glucose-Capillary 137 (H) 65 - 99 mg/dL   Comment 1 Notify RN   Glucose, capillary     Status: Abnormal   Collection Time: 11/30/15  8:03 PM  Result Value Ref Range   Glucose-Capillary 145 (H) 65 - 99 mg/dL   Comment 1 Notify RN    Comment 2 Document in Chart   Glucose, capillary     Status: Abnormal   Collection Time: 12/01/15  6:19 AM  Result Value Ref Range   Glucose-Capillary 116 (H) 65 - 99 mg/dL   Comment 1 Notify RN     Comment 2 Document in Chart   CBC     Status: Abnormal   Collection Time: 12/01/15  7:06 AM  Result Value Ref Range   WBC 8.3 4.0 - 10.5 K/uL   RBC 3.41 (L) 4.22 - 5.81 MIL/uL   Hemoglobin 12.6 (L) 13.0 - 17.0 g/dL   HCT 36.4 (L) 39.0 - 52.0 %   MCV 106.7 (H) 78.0 - 100.0 fL   MCH 37.0 (H) 26.0 - 34.0 pg   MCHC 34.6 30.0 - 36.0 g/dL   RDW 15.8 (H) 11.5 - 15.5 %   Platelets 113 (L) 150 - 400 K/uL    Comment: SPECIMEN CHECKED FOR CLOTS PLATELET COUNT CONFIRMED BY SMEAR   Basic metabolic panel     Status: Abnormal   Collection Time: 12/01/15  7:06 AM  Result Value Ref Range   Sodium 135 135 - 145 mmol/L   Potassium 3.6 3.5 - 5.1 mmol/L    Comment: DELTA CHECK NOTED   Chloride 97 (L) 101 - 111 mmol/L   CO2 24 22 - 32 mmol/L   Glucose, Bld 109 (H) 65 - 99 mg/dL   BUN 47 (H) 6 - 20 mg/dL   Creatinine, Ser 8.92 (H) 0.61 - 1.24 mg/dL   Calcium 8.2 (L) 8.9 - 10.3 mg/dL   GFR calc non Af Amer 7 (L) >60 mL/min   GFR calc Af Amer 8 (L) >60 mL/min    Comment: (NOTE) The eGFR has been calculated using the CKD EPI equation. This calculation has not been validated in all clinical situations. eGFR's persistently <60 mL/min signify possible Chronic Kidney Disease.    Anion gap 14 5 - 15  Vancomycin, random     Status: None   Collection Time: 12/01/15  7:06 AM  Result Value Ref Range   Vancomycin Rm 19 ug/mL    Comment:        Random Vancomycin therapeutic range is dependent on dosage and time of specimen collection. A peak range is 20.0-40.0 ug/mL A trough range is 5.0-15.0 ug/mL          Hepatic function panel     Status: Abnormal   Collection Time: 12/01/15  7:06 AM  Result Value Ref Range   Total Protein 6.5 6.5 - 8.1 g/dL   Albumin 2.7 (L) 3.5 - 5.0 g/dL   AST 135 (H) 15 - 41 U/L   ALT 13 (L) 17 - 63 U/L   Alkaline Phosphatase 81 38 - 126 U/L  Total Bilirubin 3.6 (H) 0.3 - 1.2 mg/dL   Bilirubin, Direct 1.8 (H) 0.1 - 0.5 mg/dL   Indirect Bilirubin 1.8 (H) 0.3 - 0.9  mg/dL  Lipase, blood     Status: Abnormal   Collection Time: 12/01/15  7:06 AM  Result Value Ref Range   Lipase 307 (H) 11 - 51 U/L  Phosphorus     Status: Abnormal   Collection Time: 12/01/15  7:06 AM  Result Value Ref Range   Phosphorus 5.1 (H) 2.5 - 4.6 mg/dL  Glucose, capillary     Status: Abnormal   Collection Time: 12/01/15  7:25 AM  Result Value Ref Range   Glucose-Capillary 109 (H) 65 - 99 mg/dL   Comment 1 Notify RN   Glucose, capillary     Status: None   Collection Time: 12/01/15  4:58 PM  Result Value Ref Range   Glucose-Capillary 89 65 - 99 mg/dL   Comment 1 Notify RN   Glucose, capillary     Status: Abnormal   Collection Time: 12/01/15 10:19 PM  Result Value Ref Range   Glucose-Capillary 117 (H) 65 - 99 mg/dL  Comprehensive metabolic panel     Status: Abnormal   Collection Time: 12/02/15  5:32 AM  Result Value Ref Range   Sodium 136 135 - 145 mmol/L   Potassium 3.6 3.5 - 5.1 mmol/L   Chloride 97 (L) 101 - 111 mmol/L   CO2 25 22 - 32 mmol/L   Glucose, Bld 99 65 - 99 mg/dL   BUN 30 (H) 6 - 20 mg/dL   Creatinine, Ser 6.38 (H) 0.61 - 1.24 mg/dL   Calcium 7.9 (L) 8.9 - 10.3 mg/dL   Total Protein 6.8 6.5 - 8.1 g/dL   Albumin 2.8 (L) 3.5 - 5.0 g/dL   AST 100 (H) 15 - 41 U/L   ALT 10 (L) 17 - 63 U/L   Alkaline Phosphatase 99 38 - 126 U/L   Total Bilirubin 3.4 (H) 0.3 - 1.2 mg/dL   GFR calc non Af Amer 11 (L) >60 mL/min   GFR calc Af Amer 13 (L) >60 mL/min    Comment: (NOTE) The eGFR has been calculated using the CKD EPI equation. This calculation has not been validated in all clinical situations. eGFR's persistently <60 mL/min signify possible Chronic Kidney Disease.    Anion gap 14 5 - 15  Lipase, blood     Status: Abnormal   Collection Time: 12/02/15  5:32 AM  Result Value Ref Range   Lipase 150 (H) 11 - 51 U/L  Glucose, capillary     Status: Abnormal   Collection Time: 12/02/15  7:28 AM  Result Value Ref Range   Glucose-Capillary 108 (H) 65 - 99 mg/dL    Comment 1 Notify RN     No results found.  ROS Blood pressure 98/70, pulse 88, temperature 98 F (36.7 C), temperature source Oral, resp. rate 18, height 5' 5"  (1.651 m), weight 121 lb 1.6 oz (54.931 kg), SpO2 98 %. Physical Exam   Alert and oriented. Skin warm and dry. Oral mucosa is moist.   . Sclera anicteric, conjunctivae is pink. Thyroid not enlarged. No cervical lymphadenopathy. Lungs clear. Heart regular rate and rhythm.  Abdomen is soft. Bowel sounds are positive. No hepatomegaly. No abdominal masses felt. Slight tenderness to epigastric region.  2+ edema to lower extremities.  Left dialysis catheter in place.    Assessment/Plan: Elevated liver enzymes. Elevated lipase. ? Etiology. Will get acute Hep C antibody.  [place] RUQ pending.  SETZER,TERRI W 12/02/2015, 8:08 AM    GI attending note: Patient interviewed and examined. Lab studies as well as abdominopelvic CT and ultrasound reviewed. Condition reviewed with Dr. Sarajane Jews earlier today. 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 Dialysis catheter has been changed. While in the hospital patient noted to have elevated bilirubin and transaminases and he is also complaining of upper abdominal pain. Noted to have elevated serum lipase. Abdominopelvic CT revealed bilateral pleural effusions right greater than left interstitial groundglass opacities felt to be consistent with pulmonary edema cardiomegaly and mild ascites. Normal appearing pancreas. Ultrasound earlier today revealed gallbladder wall thickening but no evidence of cholelithiasis. Patient has repeatedly tested negative for hepatitis B surface antigen. He also assessed and negative  for HCV. He has received hepatitis A and B vaccination. Examination is pertinent for thin male who appears to be chronically ill. He has enlarged tender liver was smooth surface. Liver is pulsatile.  GI issues can be summarized as below.: #1. Elevated transaminases and bilirubin most likely secondary to congestive hepatopathy. Imaging studies do not reveal hepatic abscess or evidence of obstructive process. Other possible explanation for elevated transaminases could be HIV but it is difficult to ignore other diagnosis in the presence of enlarged pulsatile liver and evidence of congestive process. #2. Upper abdominal pain appears to be due to large liver secondary to hepatic congestion. Patient does not have CT evidence of pancreatitis. Elevated serum lipase most likely due to chronic kidney disease.  Recommendations:  Optimize treatment of cardiomyopathy. Symptomatic therapy for abdominal pain. Check hepatitis B surface antibody. We are also checking HCV antibody given that he is on hemodialysis and had increased risk. #1.

## 2015-12-02 NOTE — Progress Notes (Signed)
Subjective: Patient feels much better. He denies any cough, fevers chills or sweating. His appetite is good and no nausea or vomiting.  Objective: Vital signs in last 24 hours: Temp:  [97.9 F (36.6 C)-98.3 F (36.8 C)] 98 F (36.7 C) (02/16 1610) Pulse Rate:  [86-94] 88 (02/15 1530) Resp:  [16-18] 18 (02/16 0633) BP: (98-120)/(51-84) 98/70 mmHg (02/16 0633) SpO2:  [98 %-100 %] 98 % (02/16 9604) Weight:  [121 lb 1.6 oz (54.931 kg)] 121 lb 1.6 oz (54.931 kg) (02/16 5409)  Intake/Output from previous day: 02/15 0701 - 02/16 0700 In: 240 [P.O.:240] Out: 3100  Intake/Output this shift:     Recent Labs  11/29/15 0932 11/29/15 1618 12/01/15 0706  HGB 14.3 13.6 12.6*    Recent Labs  11/29/15 1618 12/01/15 0706  WBC 11.5* 8.3  RBC 3.66* 3.41*  HCT 40.0 36.4*  PLT 143* 113*    Recent Labs  12/01/15 0706 12/02/15 0532  NA 135 136  K 3.6 3.6  CL 97* 97*  CO2 24 25  BUN 47* 30*  CREATININE 8.92* 6.38*  GLUCOSE 109* 99  CALCIUM 8.2* 7.9*    Recent Labs  11/29/15 0932  INR 1.80*    Patient is alert and in no acute distress Chest is clear to auscultation Heart exam regular rate and rhythm no murmur Extremities no edema   Assessment/Plan: Problem #1 MRSA infection. Catheter-tipped also grew MRSA. Patient presently with temporary catheter. He is a febrile and his white blood cell count is normal. Problem #2 end-stage renal disease: He is status post hemodialysis yesterday. Presently patient is asymptomatic. Problem #3 hyperkalemia: His potassium is normal. Problem #4 anemia: His hemoglobin remained above our target goal. Patient is off  Epogen. Problem #5. Metabolic bone disease: Calcium is range. Patient presently is not on any binder. Problem #6 fluid management: Patient does not have any significant sign of fluid overload. Problem #7 HIV positive Problem #8 hyponatremia. His sodium has corrected. Plan: 1] we'll dialyze patient for 4 hours tomorrow 2]  would remove about 3 L if his blood pressure tolerates. 3] patient may require a tunneled catheter placement which could be done as an outpatient or possibly before his discharge.   Anne Boltz S 12/02/2015, 8:27 AM

## 2015-12-03 ENCOUNTER — Other Ambulatory Visit: Payer: Self-pay | Admitting: Radiology

## 2015-12-03 LAB — RENAL FUNCTION PANEL
Albumin: 2.9 g/dL — ABNORMAL LOW (ref 3.5–5.0)
Anion gap: 17 — ABNORMAL HIGH (ref 5–15)
BUN: 45 mg/dL — AB (ref 6–20)
CALCIUM: 8.2 mg/dL — AB (ref 8.9–10.3)
CHLORIDE: 97 mmol/L — AB (ref 101–111)
CO2: 21 mmol/L — AB (ref 22–32)
CREATININE: 8.18 mg/dL — AB (ref 0.61–1.24)
GFR calc Af Amer: 9 mL/min — ABNORMAL LOW (ref >60)
GFR calc non Af Amer: 8 mL/min — ABNORMAL LOW (ref >60)
Glucose, Bld: 94 mg/dL (ref 65–99)
Phosphorus: 5.5 mg/dL — ABNORMAL HIGH (ref 2.5–4.6)
Potassium: 4.4 mmol/L (ref 3.5–5.1)
SODIUM: 135 mmol/L (ref 135–145)

## 2015-12-03 LAB — CBC
HCT: 37.1 % — ABNORMAL LOW (ref 39.0–52.0)
Hemoglobin: 13.6 g/dL (ref 13.0–17.0)
MCH: 38.3 pg — ABNORMAL HIGH (ref 26.0–34.0)
MCHC: 36.7 g/dL — ABNORMAL HIGH (ref 30.0–36.0)
MCV: 104.5 fL — ABNORMAL HIGH (ref 78.0–100.0)
PLATELETS: 137 10*3/uL — AB (ref 150–400)
RBC: 3.55 MIL/uL — AB (ref 4.22–5.81)
RDW: 15.9 % — AB (ref 11.5–15.5)
WBC: 7.8 10*3/uL (ref 4.0–10.5)

## 2015-12-03 LAB — HEPATITIS C ANTIBODY

## 2015-12-03 LAB — GLUCOSE, CAPILLARY: Glucose-Capillary: 103 mg/dL — ABNORMAL HIGH (ref 65–99)

## 2015-12-03 MED ORDER — HEPARIN SODIUM (PORCINE) 1000 UNIT/ML IJ SOLN
INTRAMUSCULAR | Status: AC
  Administered 2015-12-03: 2800 [IU] via INTRAVENOUS_CENTRAL
  Filled 2015-12-03: qty 6

## 2015-12-03 MED ORDER — SODIUM CHLORIDE 0.9 % IV SOLN: 100.0000 mL | INTRAVENOUS | Status: DC | PRN

## 2015-12-03 MED ORDER — VANCOMYCIN HCL 500 MG IV SOLR: 500.0000 mg | INTRAVENOUS | Status: DC

## 2015-12-03 NOTE — Care Management (Signed)
Discharge summary faxed to [facility] with script for Haskell Riling [phone] confirmation page received.

## 2015-12-03 NOTE — Progress Notes (Signed)
Pt discharged with family, belongings, and IV removed and intact.

## 2015-12-03 NOTE — Procedures (Signed)
   HEMODIALYSIS TREATMENT NOTE:  4 hour heparin-free dialysis completed via left IJ non-tunneled catheter. Exit site unremarkable. Goal met: 2.8 liters removed without interruption in ultrafiltration. All blood was returned. Hemodynamically stable throughout session. Report called to Janan Ridge, RN.  Rockwell Alexandria, RN, CDN

## 2015-12-03 NOTE — Progress Notes (Signed)
Subjective: Patient offers no complaint today. Patient says that his cough is better and no nausea or vomiting. Overall is feeling much better.  Objective: Vital signs in last 24 hours: Temp:  [97.5 F (36.4 C)-98.2 F (36.8 C)] 98.2 F (36.8 C) (02/17 0656) Pulse Rate:  [53-94] 94 (02/17 0656) Resp:  [16-20] 16 (02/17 0656) BP: (97-113)/(72-83) 111/83 mmHg (02/17 0656) SpO2:  [100 %] 100 % (02/17 0656)  Intake/Output from previous day: 02/16 0701 - 02/17 0700 In: 240 [P.O.:240] Out: -  Intake/Output this shift:     Recent Labs  12/01/15 0706 12/02/15 0532  HGB 12.6* 12.7*    Recent Labs  12/01/15 0706 12/02/15 0532  WBC 8.3 8.0  RBC 3.41* 3.37*  HCT 36.4* 36.1*  PLT 113* 131*    Recent Labs  12/01/15 0706 12/02/15 0532  NA 135 136  K 3.6 3.6  CL 97* 97*  CO2 24 25  BUN 47* 30*  CREATININE 8.92* 6.38*  GLUCOSE 109* 99  CALCIUM 8.2* 7.9*   No results for input(s): LABPT, INR in the last 72 hours.  Patient is alert and in no acute distress Chest is clear to auscultation Heart exam regular rate and rhythm no murmur Extremities no edema   Assessment/Plan: Problem #1 MRSA infection. Catheter-tip also grew MRSA. Patient continued to be on antibiotics. Presently he has a temperature catheter which needed to be replaced to tell catheter as an outpatient. Patient presently is a febrile and his white blood cell count is normal. Problem #2 end-stage renal disease: He is status post hemodialysis on Wednesday. Presently patient does not have any nausea or vomiting. His appetite is good. Problem #3 hyperkalemia: His potassium is normal. Problem #4 anemia: His hemoglobin remained above our target goal. Patient is off  Epogen. Problem #5. Metabolic bone disease: Calcium is range. Patient presently is not on any binder. Problem #6 fluid management: Patient does not have any significant sign of fluid overload. Problem #7 HIV positive Problem #8 hyponatremia. His  sodium has corrected. Plan: 1] we'll dialyze patient for 4 hours today 2] we will remove about 3 L if his blood pressure tolerates. 3] his next dialysis would be on Monday which is his regular schedule. If patient is going to be discharged it would be done as an outpatient.   Ketura Sirek S 12/03/2015, 7:55 AM

## 2015-12-03 NOTE — Discharge Summary (Signed)
Physician Discharge Summary  Dave Estrada ZOX:[id number] DOB: [birth year] DOA: 11/23/2015  PCP: [name] [name], MD  Admit date: 11/23/2015 Discharge date: 12/03/2015  Recommendations for Outpatient Follow-up:  1. Follow up with PCP in 2 weeks. 2. Follow up with interventional radiology on 2/20 as scheduled for tunneled catheter placement. Please catheter as well as blood cultures are negative. 3. Continue IV vancomycin with hemodialysis for MRSA bacteremia    Follow-up Information    Follow up with TURPIN,PAMELA A, PA-C.   Specialty:  Radiology   Why:  Arrive at [facility] at 6:30 am on Mon morning.  nothing to eat or drink after midnight on Sunday night   Contact information:   [address] [place] [address] [phone]       Follow up with JASON [name], MD In 2 weeks.   Specialty:  Infectious Diseases   Contact information:   [address] [facility] [address] [place] [address] [phone]      Discharge Diagnoses:  1. Sepsis due to MRSA bacteremia.  2. MRSA bacteremia secondary to line infection.  3. End-stage renal disease.  4. HIV/AIDS.  5. Nonischemic cardiomyopathy 6. Hyperkalemia. 7. Epigastric and generalized abdominal pain 8. Elevated lipase and LFTs  9. Chronic back pain 10. Thrombocytopenia  Discharge Condition: Improved Disposition: home  Diet recommendation: Heart healthy  Filed Weights   11/27/15 0729 11/28/15 0606 12/02/15 4132  Weight: 57.471 kg (126 lb 11.2 oz) 56.201 kg (123 lb 14.4 oz) 54.931 kg (121 lb 1.6 oz)    History of present illness:  25 yom PMH HIV/AIDS, ESRD on hemodialysis presented with sepsis.  Hospital Course:  Placed on broad-spectrum antibiotics. Blood cultures grew out MRSA and antibiotic therapy was tailored. Dialysis catheter was removed and culture of tip notable for MRSA. Sepsis/bacteremia secondary to line infection. Patient improved and a temporary dialysis catheter was placed. Repeat blood cultures are  pending at this time and there are plans for tunneled catheter placement 2/20 and blood cultures remain negative. Case was discussed with Dr. Ninetta Lights infectious disease. Plan total 6 weeks IV antibiotics. Hospitalization was prolonged by hypoglycemia secondary to poor oral intake which subsequently resolved. Also noted elevated LFTs and lipase thought to be secondary to hepatic congestion. Gastric neurology evaluated the patient and recommended supportive care.  Individual issues as below:  1. Sepsis due to MRSA bacteremia. Sepsis resolved. Source catheter. No evidence of pneumonia or cellulitis. 2. MRSA bacteremia. Secondary to line infection. Continues to improve. Repeat cultures pending, no growth. Discussed with infectious disease. Plan long-term antibiotics. Plan tunneled dialysis catheter 2/20 as long as blood cultures are negative. 3. End-stage renal disease. Completed dialysis 2/15. Temporary catheter in place. This was placed because of elevated white blood cell count.  4. HIV/AIDS. Stable. Continue antiretroviral therapy. 5. Nonischemic cardiomyopathy, LVEF 20-25 percent. Remains stable. 6. Hypoglycemia, likely secondary to poor oral intake. Resolved. 7. Hyperkalemia. Resolved. ACE inhibitor discontinued. 8. Epigastric and generalized abdominal pain, resolved. Likely related to constipation.  9. Elevated lipase and LFTs thought to be secondary to congestive hepatopathy. Lipase, AST trending down. Total bilirubin trending down. CT abdomen and pelvis 2/13 with unremarkable appearance of the liver and pancreas. Abdominal US shows minimal ascites and nonspecific gallbladder wall thickening.  10. Chronic back pain. Appears stable at this point. Likely aggravated by bed. 11. Thrombocytopenia, trending up.  Consultants:  Nephrology  GI  Procedures:  Removal of permacath 2/10 by general surgery  2/13 Placement of left jugular  trialysis catheter.  ECHO Study Conclusions  - Left  ventricle: The cavity size was moderately dilated. Wall thickness was normal. Systolic function was severely reduced. The estimated ejection fraction was in the range of 20% to 25%. Diffuse hypokinesis. Features are consistent with a pseudonormal left ventricular filling pattern, with concomitant abnormal relaxation and increased filling pressure (grade 2 diastolic dysfunction). Doppler parameters are consistent with high ventricular filling pressure. 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 There was moderate to severe regurgitation. - Left atrium: The atrium was moderately dilated. - Right ventricle: The cavity size was mildly dilated. Systolic function was severely reduced. - Right atrium: The atrium was moderately dilated. - Tricuspid valve: There was moderate regurgitation. - Pulmonary arteries: Systolic pressure was mildly increased. PA peak pressure: 39 mm Hg (S). - Pericardium, extracardiac: A small pericardial effusion was identified.  Antibiotics:  Vancomycin 2/7 >> 3/22  Zosyn 2/7 >> 2/9  Ancef 2/7>>2/10  Discharge Instructions Discharge Instructions    Activity as tolerated - No restrictions    Complete by:  As directed      Diet - low sodium heart healthy    Complete by:  As directed      Discharge instructions    Complete by:  As directed   Call your physician or seek immediate medical attention for fever, pain, shortness of breath, swelling or worsening of condition.            Current Discharge Medication List    START taking these medications   Details  vancomycin 500 mg in sodium chloride 0.9 % 100 mL Inject 500 mg into the vein Every Tuesday,Thursday,and Saturday with dialysis. Last dose 3/22.      CONTINUE these medications which have NOT CHANGED   Details  albuterol (PROVENTIL HFA;VENTOLIN HFA) 108 (90 BASE) MCG/ACT inhaler Inhale 1-2 puffs into the lungs every 6 (six) hours as needed  for wheezing or shortness of breath.    emtricitabine (EMTRIVA) 200 MG capsule Take 200 mg by mouth 2 (two) times a week. Taken after dialysis on Tuesday and Thursday    etravirine (INTELENCE) 100 MG tablet Take 200 mg by mouth every 12 (twelve) hours.    ibuprofen (ADVIL,MOTRIN) 200 MG tablet Take 200 mg by mouth every 6 (six) hours as needed for fever or moderate pain.    lisinopril (PRINIVIL,ZESTRIL) 2.5 MG tablet Take 1 tablet (2.5 mg total) by mouth daily. Qty: 90 tablet, Refills: 3    metoprolol succinate (TOPROL-XL) 50 MG 24 hr tablet Take 1 tablet (50 mg total) by mouth 2 (two) times daily. Take with or immediately following a meal. Qty: 180 tablet, Refills: 3    raltegravir (ISENTRESS) 400 MG tablet Take 400 mg by mouth 2 (two) times daily.    sulfamethoxazole-trimethoprim (BACTRIM,SEPTRA) 400-80 MG tablet Take 1 tablet by mouth 3 (three) times a week. To take after dialysis days    tenofovir (VIREAD) 300 MG tablet Take 300 mg by mouth once a week. To be taken on Tuesday after dialysis       Allergies  Allergen Reactions  . Bee Venom Anaphylaxis  . Other Anaphylaxis    mushrooms  . Aspirin Other (See Comments)    Reaction is unknown    The results of significant diagnostics from this hospitalization (including imaging, microbiology, ancillary and laboratory) are listed below for reference.    Significant Diagnostic Studies: Ct Abdomen Pelvis W Contrast  11/29/2015  CLINICAL DATA:  Abdominal pain with nausea  vomiting for 2-3 days EXAM: CT ABDOMEN AND PELVIS WITH CONTRAST TECHNIQUE: Multidetector CT imaging of the abdomen and pelvis was performed using the standard protocol following bolus administration of intravenous contrast. CONTRAST:  OMNIPAQUE IOHEXOL 300 MG/ML  SOLN COMPARISON:  None. FINDINGS: Lower chest: Bilateral small pleural effusions, right greater than left. Bilateral interstitial thickening ground-glass opacities. Cardiomegaly. Small pericardial  effusion. Hepatobiliary: Normal liver. Pancreas: Normal. Spleen: Normal. Adrenals/Urinary Tract: Normal adrenal glands. Atrophic kidneys. No urolithiasis or obstructive uropathy. Partially decompressed bladder. Stomach/Bowel: No bowel dilatation. No pneumatosis, pneumoperitoneum or portal venous gas. Moderate amount of abdominal and pelvic free fluid. Vascular/Lymphatic: Normal caliber abdominal aorta. No lymphadenopathy. Other: No focal fluid collection or hematoma.  Generalized anasarca. Musculoskeletal: No lytic or sclerotic osseous lesion. No acute osseous abnormality. IMPRESSION: 1. Findings most concerning for pulmonary edema. 2. Anasarca. 3. Moderate abdominal/pelvic free fluid. 4. Atrophic kidneys. Electronically Signed   By: Elige Ko   On: 11/29/2015 16:11   Ir Fluoro Guide Cv Line Left  11/29/2015  INDICATION: 25 year old with end-stage renal disease and MRSA bacteremia. A right chest PermCath was recently removed. Patient was sent to [facility] for placement of a new tunneled dialysis catheter. Upon arrival, patient had new labs obtained. WBC was elevated, 15.0. INR was elevated, 1.80. These findings were discussed with the nephrologist Dr.Befekadu. After review of the labs, we decided to place a non tunneled dialysis catheter. EXAM: FLUOROSCOPIC AND ULTRASOUND GUIDED PLACEMENT OF A NON TUNNELED DIALYSIS CATHETER Physician: Rachelle Hora. Lowella Dandy, MD MEDICATIONS: Patient is already on antibiotics. ANESTHESIA/SEDATION: None FLUOROSCOPY TIME:  Fluoroscopy Time: 12 seconds, 0.5 mGy COMPLICATIONS: None immediate. PROCEDURE: Informed consent was obtained for placement of a non tunneled dialysis catheter. The patient was placed supine on the interventional table. Ultrasound confirmed a patent left internal jugular vein. Ultrasound images were obtained for documentation. The left side of the neck was prepped and draped in a sterile fashion. The left side of the neck was anesthetized with 1% lidocaine. Maximal  barrier sterile technique was utilized including caps, mask, sterile gowns, sterile gloves, sterile drape, hand hygiene and skin antiseptic. A small incision was made with #11 blade scalpel. 18 gauge needle was directed into the left internal jugular vein with ultrasound guidance. J wire was advanced into the central veins and IVC. The tract was dilated to accommodate a 20 cm Trialysis catheter. Catheter tip placed at the superior cavoatrial junction. Both dialysis lumens were found to aspirate and flush well. The proper amount of heparin was flushed in both lumens. The central lumen was flushed with normal saline. The catheter was secured to the skin using Prolene suture. FINDINGS: Bilateral internal jugular veins are patent by ultrasound. Catheter tip at the superior cavoatrial junction. IMPRESSION: Successful placement of a non tunneled dialysis catheter using ultrasound and fluoroscopic guidance. Electronically Signed   By: Richarda Overlie M.D.   On: 11/29/2015 14:42   Ir US Guide Vasc Access Left  11/29/2015  INDICATION: 25 year old with end-stage renal disease and MRSA bacteremia. A right chest PermCath was recently removed. Patient was sent to [facility] for placement of a new tunneled dialysis catheter. Upon arrival, patient had new labs obtained. WBC was elevated, 15.0. INR was elevated, 1.80. These findings were discussed with the nephrologist Dr.Befekadu. After review of the labs, we decided to place a non tunneled dialysis catheter. EXAM: FLUOROSCOPIC AND ULTRASOUND GUIDED PLACEMENT OF A NON TUNNELED DIALYSIS CATHETER Physician: Rachelle Hora. Lowella Dandy, MD MEDICATIONS: Patient is already on antibiotics. ANESTHESIA/SEDATION: None FLUOROSCOPY  TIME:  Fluoroscopy Time: 12 seconds, 0.5 mGy COMPLICATIONS: None immediate. PROCEDURE: Informed consent was obtained for placement of a non tunneled dialysis catheter. The patient was placed supine on the interventional table. Ultrasound confirmed a patent left internal jugular  vein. Ultrasound images were obtained for documentation. The left side of the neck was prepped and draped in a sterile fashion. The left side of the neck was anesthetized with 1% lidocaine. Maximal barrier sterile technique was utilized including caps, mask, sterile gowns, sterile gloves, sterile drape, hand hygiene and skin antiseptic. A small incision was made with #11 blade scalpel. 18 gauge needle was directed into the left internal jugular vein with ultrasound guidance. J wire was advanced into the central veins and IVC. The tract was dilated to accommodate a 20 cm Trialysis catheter. Catheter tip placed at the superior cavoatrial junction. Both dialysis lumens were found to aspirate and flush well. The proper amount of heparin was flushed in both lumens. The central lumen was flushed with normal saline. The catheter was secured to the skin using Prolene suture. FINDINGS: Bilateral internal jugular veins are patent by ultrasound. Catheter tip at the superior cavoatrial junction. IMPRESSION: Successful placement of a non tunneled dialysis catheter using ultrasound and fluoroscopic guidance. Electronically Signed   By: Richarda Overlie M.D.   On: 11/29/2015 14:42   Dg Chest Portable 1 View  11/23/2015  CLINICAL DATA:  Cough and congestion.  Chronic renal failure EXAM: PORTABLE CHEST 1 VIEW COMPARISON:  Chest radiograph and chest CT September 11, 2015 FINDINGS: There is no apparent edema or consolidation. There is generalized cardiac enlargement with pulmonary vascularity within normal limits. No adenopathy. Central catheter tip is in the superior vena cava. No pneumothorax. IMPRESSION: Generalized cardiac enlargement. Cannot exclude underlying pericardial effusion. No edema or consolidation. No pneumothorax. Electronically Signed   By: Bretta Bang III M.D.   On: 11/23/2015 13:49   Dg Abd Portable 1v  11/28/2015  CLINICAL DATA:  Pain with nausea and vomiting EXAM: PORTABLE ABDOMEN - 1 VIEW COMPARISON:  None.  FINDINGS: There is a paucity of gas in the abdomen and pelvis. There is no bowel dilatation or air-fluid level suggesting obstruction. No free air. Lung bases are clear. There is a small phlebolith in the pelvis. IMPRESSION: Paucity of gas in the abdomen or pelvis. While this finding may be seen normally, it raises question of ileus or enteritis. Obstruction not felt to be likely. No free air is seen on this supine examination. Lung bases clear. Electronically Signed   By: Bretta Bang III M.D.   On: 11/28/2015 15:36   US Abdomen Limited Ruq  12/02/2015  CLINICAL DATA:  Elevated liver function tests, history of HIV and end-stage renal disease EXAM: US ABDOMEN LIMITED - RIGHT UPPER QUADRANT COMPARISON:  None. FINDINGS: Gallbladder: There is gallbladder wall thickening to about 1 cm. There is pericholecystic fluid. There is sludge within the gallbladder. There is no sonographic Murphy sign. There is no shadowing calculus. Common bile duct: Diameter: 3 mm Liver: No focal lesion identified. Within normal limits in parenchymal echogenicity. Minimal ascites is present. Right kidney is noted to be of increased echogenicity. IMPRESSION: 1. Gallbladder wall thickening. In the presence of ascites this is a nonspecific finding. The possibility of acalculous cholecystitis is not excluded. 2. Very small volume of ascites 3. Medical renal disease Electronically Signed   By: Esperanza Heir M.D.   On: 12/02/2015 14:02    Microbiology: Recent Results (from the past 240 hour(s))  Culture,  blood (routine x 2)     Status: None   Collection Time: 11/23/15  1:29 PM  Result Value Ref Range Status   Specimen Description BLOOD RIGHT ANTECUBITAL  Final   Special Requests   Final    BOTTLES DRAWN AEROBIC AND ANAEROBIC AEB=8CC ANA=6CC   Culture  Setup Time   Final    GRAM POSITIVE COCCI IN CLUSTERS RECOVERED FROM BOTH BOTTLES Gram Stain Report Called to,Read Back By and Verified With: THOMAS K AT 0406 ON [id number] BY FORSYTH  K Performed at [facility]    Culture   Final    METHICILLIN RESISTANT STAPHYLOCOCCUS AUREUS Performed at [facility]    Report Status 11/26/2015 FINAL  Final   Organism ID, Bacteria METHICILLIN RESISTANT STAPHYLOCOCCUS AUREUS  Final      Susceptibility   Methicillin resistant staphylococcus aureus - MIC*    CIPROFLOXACIN >=8 RESISTANT Resistant     ERYTHROMYCIN >=8 RESISTANT Resistant     GENTAMICIN <=0.5 SENSITIVE Sensitive     OXACILLIN >=4 RESISTANT Resistant     TETRACYCLINE <=1 SENSITIVE Sensitive     VANCOMYCIN <=0.5 SENSITIVE Sensitive     TRIMETH/SULFA <=10 SENSITIVE Sensitive     CLINDAMYCIN <=0.25 SENSITIVE Sensitive     RIFAMPIN <=0.5 SENSITIVE Sensitive     Inducible Clindamycin NEGATIVE Sensitive     * METHICILLIN RESISTANT STAPHYLOCOCCUS AUREUS  MRSA PCR Screening     Status: Abnormal   Collection Time: 11/23/15  6:44 PM  Result Value Ref Range Status   MRSA by PCR POSITIVE (A) NEGATIVE Final    Comment:        The GeneXpert MRSA Assay (FDA approved for NASAL specimens only), is one component of a comprehensive MRSA colonization surveillance program. It is not intended to diagnose MRSA infection nor to guide or monitor treatment for MRSA infections. RESULT CALLED TO, READ BACK BY AND VERIFIED WITH: THOMAS K AT 2208 ON [id number] BY FORSYTH K   Culture, blood (routine x 2)     Status: None   Collection Time: 11/24/15  8:39 AM  Result Value Ref Range Status   Specimen Description RIGHT ANTECUBITAL  Final   Special Requests BOTTLES DRAWN AEROBIC ONLY 6CC  Final   Culture NO GROWTH 5 DAYS  Final   Report Status 11/29/2015 FINAL  Final  Culture, blood (x 2)     Status: None   Collection Time: 11/24/15  2:40 PM  Result Value Ref Range Status   Specimen Description BLOOD HEMODIALYSIS CATHETER VENOUS PORT  Final   Special Requests   Final    BOTTLES DRAWN AEROBIC AND ANAEROBIC AEB=10CC ANA=8CC   Culture NO GROWTH 5 DAYS  Final   Report Status  11/29/2015 FINAL  Final  Culture, blood (x 2)     Status: None   Collection Time: 11/24/15  3:30 PM  Result Value Ref Range Status   Specimen Description BLOOD HD ART PORT DRAWN BY RN  Final   Special Requests   Final    BOTTLES DRAWN AEROBIC AND ANAEROBIC AEB=9CC ANA=6CC   Culture NO GROWTH 5 DAYS  Final   Report Status 11/29/2015 FINAL  Final  Cath Tip Culture     Status: None   Collection Time: 11/26/15  9:28 AM  Result Value Ref Range Status   Specimen Description HEMODIALYSIS CATHETER  Final   Special Requests NONE  Final   Culture   Final    >100 COLONIES METHICILLIN RESISTANT STAPHYLOCOCCUS AUREUS Note: RIFAMPIN AND GENTAMICIN  SHOULD NOT BE USED AS SINGLE DRUGS FOR TREATMENT OF STAPH INFECTIONS. This organism DOES NOT demonstrate inducible Clindamycin resistance in vitro. CRITICAL RESULT CALLED TO, READ BACK BY AND VERIFIED WITH: Anson Crofts 2/13  @0900  BY REAMM Performed at [facility]    Report Status 11/29/2015 FINAL  Final   Organism ID, Bacteria METHICILLIN RESISTANT STAPHYLOCOCCUS AUREUS  Final      Susceptibility   Methicillin resistant staphylococcus aureus - MIC*    CLINDAMYCIN <=0.25 SENSITIVE Sensitive     ERYTHROMYCIN >=8 RESISTANT Resistant     GENTAMICIN <=0.5 SENSITIVE Sensitive     LEVOFLOXACIN 4 INTERMEDIATE Intermediate     OXACILLIN >=4 RESISTANT Resistant     RIFAMPIN <=0.5 SENSITIVE Sensitive     TRIMETH/SULFA <=10 SENSITIVE Sensitive     VANCOMYCIN 1 SENSITIVE Sensitive     TETRACYCLINE <=1 SENSITIVE Sensitive     * >100 COLONIES METHICILLIN RESISTANT STAPHYLOCOCCUS AUREUS  Culture, blood (routine x 2)     Status: None (Preliminary result)   Collection Time: 12/02/15  9:05 AM  Result Value Ref Range Status   Specimen Description BLOOD RIGHT ANTECUBITAL  Final   Special Requests BOTTLES DRAWN AEROBIC AND ANAEROBIC 6CC EACH  Final   Culture NO GROWTH < 24 HOURS  Final   Report Status PENDING  Incomplete  Culture, blood (routine x 2)      Status: None (Preliminary result)   Collection Time: 12/02/15  9:15 AM  Result Value Ref Range Status   Specimen Description BLOOD RIGHT HAND  Final   Special Requests BOTTLES DRAWN AEROBIC ONLY 5CC  Final   Culture NO GROWTH < 24 HOURS  Final   Report Status PENDING  Incomplete     Labs: Basic Metabolic Panel:  Recent Labs Lab 11/27/15 0653  11/28/15 0949 11/29/15 1618 12/01/15 0706 12/02/15 0532 12/03/15 0827  NA  --   < > 133* 129*  129* 135 136 135  K  --   < > 5.0 5.7*  5.7* 3.6 3.6 4.4  CL  --   < > 95* 93*  93* 97* 97* 97*  CO2  --   < > 17* 13*  15* 24 25 21*  GLUCOSE  --   < > 136* 106*  109* 109* 99 94  BUN  --   < > 61* 82*  82* 47* 30* 45*  CREATININE  --   < > 9.41* 11.32*  11.03* 8.92* 6.38* 8.18*  CALCIUM  --   < > 8.1* 8.1*  8.1* 8.2* 7.9* 8.2*  PHOS 4.3  --   --  8.4* 5.1*  --  5.5*  < > = values in this interval not displayed. Liver Function Tests:  Recent Labs Lab 11/29/15 1618 12/01/15 0706 12/02/15 0532 12/03/15 0827  AST 262* 135* 100*  --   ALT 30 13* 10*  --   ALKPHOS 69 81 99  --   BILITOT 3.8* 3.6* 3.4*  --   PROT 6.1* 6.5 6.8  --   ALBUMIN 2.4*  2.6* 2.7* 2.8* 2.9*    Recent Labs Lab 11/29/15 1618 12/01/15 0706 12/02/15 0532  LIPASE 284* 307* 150*    CBC:  Recent Labs Lab 11/29/15 0932 11/29/15 1618 12/01/15 0706 12/02/15 0532 12/03/15 0827  WBC 15.0* 11.5* 8.3 8.0 7.8  HGB 14.3 13.6 12.6* 12.7* 13.6  HCT 41.2 40.0 36.4* 36.1* 37.1*  MCV 107.3* 109.3* 106.7* 107.1* 104.5*  PLT 149* 143* 113* 131* 137*  CBG:  Recent Labs Lab 12/01/15 1658 12/01/15 2219 12/02/15 0728 12/02/15 2130 12/03/15 0809  GLUCAP 89 117* 108* 121* 103*    Principal Problem:   MRSA bacteremia Active Problems:   HIV (human immunodeficiency virus infection) (HCC)   ESRD on hemodialysis (HCC)   Staphylococcus aureus bacteremia with sepsis (HCC)   AIDS (HCC)   Nonischemic cardiomyopathy (HCC)   Time coordinating discharge:  35 minutes  Signed:  Brendia Sacks, MD [facility] 12/03/2015, 7:48 AM    By signing my name below, I, Burnett Harry attest that this documentation has been prepared under the direction and in the presence of Brendia Sacks, MD Electronically signed: Burnett Harry, Scribe.  12/03/2015   I personally performed the services described in this documentation. All medical record entries made by the scribe were at my direction. I have reviewed the chart and agree that the record reflects my personal performance and is accurate and complete. Brendia Sacks, MD

## 2015-12-03 NOTE — Progress Notes (Signed)
PROGRESS NOTE  Dave Estrada LOV:[id number] DOB: [birth year] DOA: 11/23/2015 PCP: [name] [name], MD  Summary: 2 yom with a hx of HIV and  ESRD on hemodialysis, presents with fever and sepsis. He was found to have MRSA bacteremia likely due to infected dialysis catheter. Patient was started on abx and catheter was removed 2/11. A new catheter was placed 2.13. His course was complicated by persistent hypoglycemia, but has improved with D10 infusions. Hypoglycemia may have been the result of decreased PO intake due to nausea, but since undergoing hemodialysis, his nausea and vomiting appear to have improved.   Assessment/Plan: 1. Sepsis due to MRSA bacteremia. Sepsis resolved. Source catheter. No evidence of pneumonia or cellulitis. 2. MRSA bacteremia. Secondary to line infection. Continues to improve. Repeat cultures pending, no growth. Discussed with infectious disease. Plan long-term antibiotics. Plan tunneled dialysis catheter 2/20 as long as blood cultures are negative. 3. End-stage renal disease. Completed dialysis 2/15. Temporary catheter in place. This was placed because of elevated white blood cell count.  4. HIV/AIDS. Stable. Continue antiretroviral therapy. 5. Nonischemic cardiomyopathy, LVEF 20-25 percent. Remains stable. 6. Hypoglycemia, likely secondary to poor oral intake. Resolved. 7. Hyperkalemia. Resolved. ACE inhibitor discontinued. 8. Epigastric and generalized abdominal pain, resolved. Likely related to constipation.  9. Elevated lipase and LFTs thought to be secondary to congestive hepatopathy. Lipase, AST trending down. Total bilirubin trending down. CT abdomen and pelvis 2/13 with unremarkable appearance of the liver and pancreas. Abdominal US shows minimal ascites and nonspecific gallbladder wall thickening.   10. Chronic back pain. Appears stable at this point. Likely aggravated by bed. 11. Thrombocytopenia, trending up.   Overall appears stable. Plan to continue  Vancomycin until 01/05/16. No vegetation seen on TTE.  Continue anti-retroviral therapy.  Discharge home today.   Plan for a tunneled dialysis catheter on 2/20.   Code Status: Full DVT prophylaxis: SCD's Family Communication: none  Disposition Plan: Discharge home today.   Brendia Sacks, MD  [facility]  Pager [phone] If 7PM-7AM, please contact night-coverage at [email or web address], password [facility] 12/03/2015, 7:45 AM  LOS: 9 days   Consultants:  Nephro  GI  Procedures:  Removal of permacath 2/10 by general surgery  2/13 Placement of left jugular trialysis catheter. Tip at SVC/RA junction. Ready to use. No immediate complication  ECHO Study Conclusions  - Left ventricle: The cavity size was moderately dilated. Wall thickness was normal. Systolic function was severely reduced. The estimated ejection fraction was in the range of 20% to 25%. Diffuse hypokinesis. Features are consistent with a pseudonormal left ventricular filling pattern, with concomitant abnormal relaxation and increased filling pressure (grade 2 diastolic dysfunction). Doppler parameters are consistent with high ventricular filling pressure. 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 There was moderate to severe regurgitation. - Left atrium: The atrium was moderately dilated. - Right ventricle: The cavity size was mildly dilated. Systolic function was severely reduced. - Right atrium: The atrium was moderately dilated. - Tricuspid valve: There was moderate regurgitation. - Pulmonary arteries: Systolic pressure was mildly increased. PA peak pressure: 39 mm Hg (S). - Pericardium, extracardiac: A small pericardial effusion was identified.  Antibiotics:  Vancomycin 2/7 >> 3/22  Zosyn 2/7 >> 2/9  Ancef 2/7>>2/10  HPI/Subjective: Feeling well. No complaints. Go home.  Objective: Filed Vitals:   12/02/15 0929 12/02/15 1451 12/02/15  2209 12/03/15 0656  BP: 113/83 105/72 97/74 111/83  Pulse:  87 53 94  Temp:  97.6 F (36.4 C) 97.5 F (  36.4 C) 98.2 F (36.8 C)  TempSrc:  Oral Oral Oral  Resp:  Height:      Weight:      SpO2:  100% 100% 100%    Intake/Output Summary (Last 24 hours) at 12/03/15 0745 Last data filed at 12/02/15 2045  Gross per 24 hour  Intake    240 ml  Output      0 ml  Net    240 ml     Filed Weights   11/27/15 0729 11/28/15 0606 12/02/15 0633  Weight: 57.471 kg (126 lb 11.2 oz) 56.201 kg (123 lb 14.4 oz) 54.931 kg (121 lb 1.6 oz)    Exam:    Afebrile, VSS, not hypoxic General:  Appears calm and comfortable, ambulating in hallway Cardiovascular: RRR, no m/r/g. 2+ bilateral LE edema. Respiratory: CTA bilaterally, no w/r/r. Normal respiratory effort. Abdomen: soft, ntnd Psychiatric: grossly normal mood and affect, speech fluent and appropriate  New data reviewed:  BMP consistent with ESRD.   Platelets improved 137, remainder of CBC unremarkable  CBG stable.   Pending data:  none  Scheduled Meds: . emtricitabine  200 mg Oral Once per day on Mon Thu  . etravirine  200 mg Oral Q12H  . guaiFENesin  1,200 mg Oral BID  . metoprolol succinate  50 mg Oral BID  . milk and molasses  1 enema Rectal Once  . pantoprazole  40 mg Oral Daily  . polyethylene glycol  17 g Oral TID  . raltegravir  400 mg Oral BID  . sulfamethoxazole-trimethoprim  0.5 tablet Oral Once per day on Mon Wed Fri  . tenofovir  300 mg Oral Weekly  . vancomycin  500 mg Intravenous Q M,W,F-HD   Continuous Infusions: . sodium chloride 10 mL/hr (11/24/15 2308)    Principal Problem:   MRSA bacteremia Active Problems:   HIV (human immunodeficiency virus infection) (HCC)   ESRD on hemodialysis (HCC)   Staphylococcus aureus bacteremia with sepsis (HCC)   AIDS (HCC)   Nonischemic cardiomyopathy (HCC)    By signing my name below, I, Burnett Harry attest that this documentation has been prepared  under the direction and in the presence of Brendia Sacks, MD Electronically signed: Burnett Harry, Scribe.  12/03/2015   I personally performed the services described in this documentation. All medical record entries made by the scribe were at my direction. I have reviewed the chart and agree that the record reflects my personal performance and is accurate and complete. Brendia Sacks, MD

## 2015-12-04 LAB — HEPATITIS B SURFACE ANTIBODY, QUANTITATIVE: HEPATITIS B-POST: 4.6 m[IU]/mL — AB

## 2015-12-06 ENCOUNTER — Ambulatory Visit (HOSPITAL_COMMUNITY)
Admit: 2015-12-06 | Discharge: 2015-12-06 | Disposition: A | Discharge status: Home / Self Care | Payer: Medicare Other | Source: Ambulatory Visit | Attending: Family Medicine | Admitting: Family Medicine

## 2015-12-06 ENCOUNTER — Ambulatory Visit (HOSPITAL_COMMUNITY)
Admission: RE | Admit: 2015-12-06 | Discharge: 2015-12-06 | Disposition: A | Discharge status: Home / Self Care | Payer: Medicare Other | Source: Ambulatory Visit | Attending: Cardiovascular Disease | Admitting: Cardiovascular Disease

## 2015-12-06 ENCOUNTER — Other Ambulatory Visit: Payer: Self-pay | Admitting: Cardiovascular Disease

## 2015-12-06 ENCOUNTER — Encounter (HOSPITAL_COMMUNITY): Payer: Self-pay

## 2015-12-06 DIAGNOSIS — Z992 Dependence on renal dialysis: Secondary | ICD-10-CM | POA: Diagnosis not present

## 2015-12-06 DIAGNOSIS — N186 End stage renal disease: Secondary | ICD-10-CM | POA: Diagnosis not present

## 2015-12-06 DIAGNOSIS — Z886 Allergy status to analgesic agent status: Secondary | ICD-10-CM | POA: Diagnosis not present

## 2015-12-06 DIAGNOSIS — B20 Human immunodeficiency virus [HIV] disease: Secondary | ICD-10-CM | POA: Insufficient documentation

## 2015-12-06 DIAGNOSIS — Z8614 Personal history of Methicillin resistant Staphylococcus aureus infection: Secondary | ICD-10-CM | POA: Diagnosis not present

## 2015-12-06 DIAGNOSIS — Z79899 Other long term (current) drug therapy: Secondary | ICD-10-CM | POA: Diagnosis not present

## 2015-12-06 DIAGNOSIS — F17210 Nicotine dependence, cigarettes, uncomplicated: Secondary | ICD-10-CM | POA: Diagnosis not present

## 2015-12-06 DIAGNOSIS — Z4901 Encounter for fitting and adjustment of extracorporeal dialysis catheter: Secondary | ICD-10-CM | POA: Diagnosis not present

## 2015-12-06 DIAGNOSIS — J45909 Unspecified asthma, uncomplicated: Secondary | ICD-10-CM | POA: Diagnosis not present

## 2015-12-06 DIAGNOSIS — B958 Unspecified staphylococcus as the cause of diseases classified elsewhere: Secondary | ICD-10-CM | POA: Diagnosis not present

## 2015-12-06 LAB — BASIC METABOLIC PANEL
Anion gap: 11 (ref 5–15)
BUN: 41 mg/dL — ABNORMAL HIGH (ref 6–20)
CHLORIDE: 108 mmol/L (ref 101–111)
CO2: 20 mmol/L — ABNORMAL LOW (ref 22–32)
CREATININE: 9.11 mg/dL — AB (ref 0.61–1.24)
Calcium: 7.9 mg/dL — ABNORMAL LOW (ref 8.9–10.3)
GFR, EST AFRICAN AMERICAN: 8 mL/min — AB (ref >60)
GFR, EST NON AFRICAN AMERICAN: 7 mL/min — AB (ref >60)
Glucose, Bld: 114 mg/dL — ABNORMAL HIGH (ref 65–99)
POTASSIUM: 4.1 mmol/L (ref 3.5–5.1)
SODIUM: 139 mmol/L (ref 135–145)

## 2015-12-06 LAB — CBC
HCT: 36.8 % — ABNORMAL LOW (ref 39.0–52.0)
Hemoglobin: 12.5 g/dL — ABNORMAL LOW (ref 13.0–17.0)
MCH: 37.8 pg — ABNORMAL HIGH (ref 26.0–34.0)
MCHC: 34 g/dL (ref 30.0–36.0)
MCV: 111.2 fL — AB (ref 78.0–100.0)
PLATELETS: 175 10*3/uL (ref 150–400)
RBC: 3.31 MIL/uL — AB (ref 4.22–5.81)
RDW: 17.1 % — ABNORMAL HIGH (ref 11.5–15.5)
WBC: 10.2 10*3/uL (ref 4.0–10.5)

## 2015-12-06 LAB — PROTIME-INR
INR: 1.19 (ref 0.00–1.49)
Prothrombin Time: 15.3 seconds — ABNORMAL HIGH (ref 11.6–15.2)

## 2015-12-06 LAB — APTT: APTT: 31 s (ref 24–37)

## 2015-12-06 MED ORDER — MIDAZOLAM HCL 2 MG/2ML IJ SOLN
INTRAMUSCULAR | Status: AC | PRN
  Administered 2015-12-06 (×2): 1 mg via INTRAVENOUS

## 2015-12-06 MED ORDER — FENTANYL CITRATE (PF) 100 MCG/2ML IJ SOLN
INTRAMUSCULAR | Status: AC
  Filled 2015-12-06: qty 4

## 2015-12-06 MED ORDER — FENTANYL CITRATE (PF) 100 MCG/2ML IJ SOLN
INTRAMUSCULAR | Status: AC | PRN
  Administered 2015-12-06 (×2): 50 ug via INTRAVENOUS

## 2015-12-06 MED ORDER — CEFAZOLIN SODIUM-DEXTROSE 2-3 GM-% IV SOLR
INTRAVENOUS | Status: AC
  Administered 2015-12-06: 2 g via INTRAVENOUS
  Filled 2015-12-06: qty 50

## 2015-12-06 MED ORDER — LIDOCAINE HCL 1 % IJ SOLN
INTRAMUSCULAR | Status: AC
  Filled 2015-12-06: qty 20

## 2015-12-06 MED ORDER — SODIUM CHLORIDE 0.9 % IV SOLN
Freq: Once | INTRAVENOUS | Status: AC
  Administered 2015-12-06: 09:00:00 via INTRAVENOUS

## 2015-12-06 MED ORDER — CEFAZOLIN SODIUM-DEXTROSE 2-3 GM-% IV SOLR
2.0000 g | INTRAVENOUS | Status: AC
  Administered 2015-12-06: 2 g via INTRAVENOUS

## 2015-12-06 MED ORDER — HEPARIN SODIUM (PORCINE) 1000 UNIT/ML IJ SOLN
INTRAMUSCULAR | Status: AC
  Filled 2015-12-06: qty 1

## 2015-12-06 MED ORDER — IOHEXOL 300 MG/ML  SOLN
50.0000 mL | Freq: Once | INTRAMUSCULAR | Status: AC | PRN
  Administered 2015-12-06: 10 mL via INTRAVENOUS

## 2015-12-06 MED ORDER — MIDAZOLAM HCL 2 MG/2ML IJ SOLN
INTRAMUSCULAR | Status: AC
  Filled 2015-12-06: qty 4

## 2015-12-06 NOTE — Discharge Instructions (Signed)
Call [phone] if any problems,questions, or concerns; call if any bleeding,drainage,fever, pain,swelling or redness at dialysis catheter site

## 2015-12-06 NOTE — Sedation Documentation (Signed)
Patient denies pain and is resting comfortably.  

## 2015-12-06 NOTE — Sedation Documentation (Signed)
Patient denies pain and is resting comfortably. Pt denies any concerns at this time, will continue to monitor

## 2015-12-06 NOTE — Sedation Documentation (Signed)
Patient denies pain and is resting comfortably. Pt denies any concerns at this time

## 2015-12-06 NOTE — Sedation Documentation (Signed)
Patient is resting comfortably. Pt denies any concerns at this time

## 2015-12-06 NOTE — Progress Notes (Signed)
Ok to discharge at 11am per Marlena Clipper

## 2015-12-06 NOTE — H&P (Signed)
Chief Complaint: Patient was seen in consultation today for Tunneled hemodialysis catheter placement at the request of Goodrich,Daniel P  Referring Physician(s): Goodrich,Daniel P  History of Present Illness: Dave Estrada is a 25 y.o. male   ESRD HIV Hx MRSA bacteremia 08/2014 More recent Staph bacteremia 11/28/2015---PC removed then  Hemodialysis dependent since 2014 Left upper arm dialysis fistula placed 10/2014----used few times once matured--clotted and has not used now in months...for re evaluation and revision soon at [facility] cath was removed 2/12 for staph bacteremia; temporary catheter placed by Dr Lowella Dandy 11/29/15 Now scheduled for replacement to tunneled HD catheter   Past Medical History  Diagnosis Date  . HIV (human immunodeficiency virus infection) (HCC)   . ESRD (end stage renal disease) (HCC)   . Staphylococcus aureus bacteremia with sepsis (HCC) 09/09/2014  . A-V fistula (HCC)     Placed on 10/27/2014 at [facility]; for future HD use.   . Systolic dysfunction, left ventricle 12/20/2014    EF 20%   . Asthma     Past Surgical History  Procedure Laterality Date  . Port a cath revision    . Tee without cardioversion N/A 09/14/2014    Procedure: TRANSESOPHAGEAL ECHOCARDIOGRAM (TEE);  Surgeon: Laurey Morale, MD;  Location: [facility] ENDOSCOPY;  Service: Cardiovascular;  Laterality: N/A;  will be at [facility] by mon  . Insertion of dialysis catheter Left 09/15/2014    Procedure: INSERTION OF DIALYSIS CATHETER;  Surgeon: Nada Libman, MD;  Location: [facility] OR;  Service: Vascular;  Laterality: Left;    Allergies: Bee venom; Other; and Aspirin  Medications: Prior to Admission medications   Medication Sig Start Date End Date Taking? Authorizing Provider  albuterol (PROVENTIL HFA;VENTOLIN HFA) 108 (90 BASE) MCG/ACT inhaler Inhale 1-2 puffs into the lungs every 6 (six) hours as needed for wheezing or shortness of breath.   Yes Historical Provider, MD  emtricitabine  (EMTRIVA) 200 MG capsule Take 200 mg by mouth 2 (two) times a week. Taken after dialysis on Tuesday and Thursday   Yes Historical Provider, MD  etravirine (INTELENCE) 100 MG tablet Take 200 mg by mouth every 12 (twelve) hours.   Yes Historical Provider, MD  ibuprofen (ADVIL,MOTRIN) 200 MG tablet Take 200 mg by mouth every 6 (six) hours as needed for fever or moderate pain.   Yes Historical Provider, MD  lisinopril (PRINIVIL,ZESTRIL) 2.5 MG tablet Take 1 tablet (2.5 mg total) by mouth daily. 11/19/15  Yes Laqueta Linden, MD  metoprolol succinate (TOPROL-XL) 50 MG 24 hr tablet Take 1 tablet (50 mg total) by mouth 2 (two) times daily. Take with or immediately following a meal. 11/19/15  Yes Laqueta Linden, MD  raltegravir (ISENTRESS) 400 MG tablet Take 400 mg by mouth 2 (two) times daily.   Yes Historical Provider, MD  sulfamethoxazole-trimethoprim (BACTRIM,SEPTRA) 400-80 MG tablet Take 1 tablet by mouth 3 (three) times a week. To take after dialysis days   Yes Historical Provider, MD  tenofovir (VIREAD) 300 MG tablet Take 300 mg by mouth once a week. To be taken on Tuesday after dialysis   Yes Historical Provider, MD  vancomycin 500 mg in sodium chloride 0.9 % 100 mL Inject 500 mg into the vein Every Tuesday,Thursday,and Saturday with dialysis. Last dose 3/22. 12/03/15  Yes Standley Brooking, MD     Family History  Problem Relation Age of Onset  . Seizures Father     Social History   Social History  . Marital Status: Single  Spouse Name: N/A  . Number of Children: N/A  . Years of Education: N/A   Social History Main Topics  . Smoking status: Current Every Day Smoker -- 1.00 packs/day for 11 years    Types: Cigarettes    Start date: 01/28/2003  . Smokeless tobacco: Never Used  . Alcohol Use: No  . Drug Use: Yes    Special: Marijuana  . Sexual Activity: Not Currently   Other Topics Concern  . None   Social History Narrative     Review of Systems: A 12 point ROS discussed  and pertinent positives are indicated in the HPI above.  All other systems are negative.  Review of Systems  Constitutional: Negative for fever, activity change, appetite change and fatigue.  Respiratory: Negative for shortness of breath.   Gastrointestinal: Negative for abdominal pain.  Neurological: Negative for weakness.  Psychiatric/Behavioral: Negative for behavioral problems and confusion.    Vital Signs: BP 135/97 mmHg  Pulse 109  Temp(Src) 98.4 F (36.9 C)  Resp 18  Ht 5\' 5"  (1.651 m)  Wt 124 lb (56.246 kg)  BMI 20.63 kg/m2  SpO2 100%  Physical Exam  Constitutional: He is oriented to person, place, and time. He appears well-nourished.  Cardiovascular: Normal rate, regular rhythm and normal heart sounds.   Pulmonary/Chest: Effort normal and breath sounds normal. He has no wheezes.  Abdominal: Soft. Bowel sounds are normal.  Musculoskeletal: Normal range of motion.  Left upper arm dialysis fistula; no pulse; no thrill  Neurological: He is alert and oriented to person, place, and time.  Skin: Skin is warm and dry.  Psychiatric: He has a normal mood and affect. His behavior is normal. Judgment and thought content normal.  Nursing note and vitals reviewed.   Mallampati Score:  MD Evaluation Airway: WNL Heart: WNL Abdomen: WNL Chest/ Lungs: WNL ASA  Classification: 3 Mallampati/Airway Score: One  Imaging: Ct Abdomen Pelvis W Contrast  11/29/2015  CLINICAL DATA:  Abdominal pain with nausea vomiting for 2-3 days EXAM: CT ABDOMEN AND PELVIS WITH CONTRAST TECHNIQUE: Multidetector CT imaging of the abdomen and pelvis was performed using the standard protocol following bolus administration of intravenous contrast. CONTRAST:  OMNIPAQUE IOHEXOL 300 MG/ML  SOLN COMPARISON:  None. FINDINGS: Lower chest: Bilateral small pleural effusions, right greater than left. Bilateral interstitial thickening ground-glass opacities. Cardiomegaly. Small pericardial effusion.  Hepatobiliary: Normal liver. Pancreas: Normal. Spleen: Normal. Adrenals/Urinary Tract: Normal adrenal glands. Atrophic kidneys. No urolithiasis or obstructive uropathy. Partially decompressed bladder. Stomach/Bowel: No bowel dilatation. No pneumatosis, pneumoperitoneum or portal venous gas. Moderate amount of abdominal and pelvic free fluid. Vascular/Lymphatic: Normal caliber abdominal aorta. No lymphadenopathy. Other: No focal fluid collection or hematoma.  Generalized anasarca. Musculoskeletal: No lytic or sclerotic osseous lesion. No acute osseous abnormality. IMPRESSION: 1. Findings most concerning for pulmonary edema. 2. Anasarca. 3. Moderate abdominal/pelvic free fluid. 4. Atrophic kidneys. Electronically Signed   By: Elige Ko   On: 11/29/2015 16:11   Ir Fluoro Guide Cv Line Left  11/29/2015  INDICATION: 25 year old with end-stage renal disease and MRSA bacteremia. A right chest PermCath was recently removed. Patient was sent to [facility] for placement of a new tunneled dialysis catheter. Upon arrival, patient had new labs obtained. WBC was elevated, 15.0. INR was elevated, 1.80. These findings were discussed with the nephrologist Dr.Befekadu. After review of the labs, we decided to place a non tunneled dialysis catheter. EXAM: FLUOROSCOPIC AND ULTRASOUND GUIDED PLACEMENT OF A NON TUNNELED DIALYSIS CATHETER Physician: Rachelle Hora. Lowella Dandy, MD  MEDICATIONS: Patient is already on antibiotics. ANESTHESIA/SEDATION: None FLUOROSCOPY TIME:  Fluoroscopy Time: 12 seconds, 0.5 mGy COMPLICATIONS: None immediate. PROCEDURE: Informed consent was obtained for placement of a non tunneled dialysis catheter. The patient was placed supine on the interventional table. Ultrasound confirmed a patent left internal jugular vein. Ultrasound images were obtained for documentation. The left side of the neck was prepped and draped in a sterile fashion. The left side of the neck was anesthetized with 1% lidocaine. Maximal barrier  sterile technique was utilized including caps, mask, sterile gowns, sterile gloves, sterile drape, hand hygiene and skin antiseptic. A small incision was made with #11 blade scalpel. 18 gauge needle was directed into the left internal jugular vein with ultrasound guidance. J wire was advanced into the central veins and IVC. The tract was dilated to accommodate a 20 cm Trialysis catheter. Catheter tip placed at the superior cavoatrial junction. Both dialysis lumens were found to aspirate and flush well. The proper amount of heparin was flushed in both lumens. The central lumen was flushed with normal saline. The catheter was secured to the skin using Prolene suture. FINDINGS: Bilateral internal jugular veins are patent by ultrasound. Catheter tip at the superior cavoatrial junction. IMPRESSION: Successful placement of a non tunneled dialysis catheter using ultrasound and fluoroscopic guidance. Electronically Signed   By: Richarda Overlie M.D.   On: 11/29/2015 14:42   Ir US Guide Vasc Access Left  11/29/2015  INDICATION: 25 year old with end-stage renal disease and MRSA bacteremia. A right chest PermCath was recently removed. Patient was sent to [facility] for placement of a new tunneled dialysis catheter. Upon arrival, patient had new labs obtained. WBC was elevated, 15.0. INR was elevated, 1.80. These findings were discussed with the nephrologist Dr.Befekadu. After review of the labs, we decided to place a non tunneled dialysis catheter. EXAM: FLUOROSCOPIC AND ULTRASOUND GUIDED PLACEMENT OF A NON TUNNELED DIALYSIS CATHETER Physician: Rachelle Hora. Lowella Dandy, MD MEDICATIONS: Patient is already on antibiotics. ANESTHESIA/SEDATION: None FLUOROSCOPY TIME:  Fluoroscopy Time: 12 seconds, 0.5 mGy COMPLICATIONS: None immediate. PROCEDURE: Informed consent was obtained for placement of a non tunneled dialysis catheter. The patient was placed supine on the interventional table. Ultrasound confirmed a patent left internal jugular vein.  Ultrasound images were obtained for documentation. The left side of the neck was prepped and draped in a sterile fashion. The left side of the neck was anesthetized with 1% lidocaine. Maximal barrier sterile technique was utilized including caps, mask, sterile gowns, sterile gloves, sterile drape, hand hygiene and skin antiseptic. A small incision was made with #11 blade scalpel. 18 gauge needle was directed into the left internal jugular vein with ultrasound guidance. J wire was advanced into the central veins and IVC. The tract was dilated to accommodate a 20 cm Trialysis catheter. Catheter tip placed at the superior cavoatrial junction. Both dialysis lumens were found to aspirate and flush well. The proper amount of heparin was flushed in both lumens. The central lumen was flushed with normal saline. The catheter was secured to the skin using Prolene suture. FINDINGS: Bilateral internal jugular veins are patent by ultrasound. Catheter tip at the superior cavoatrial junction. IMPRESSION: Successful placement of a non tunneled dialysis catheter using ultrasound and fluoroscopic guidance. Electronically Signed   By: Richarda Overlie M.D.   On: 11/29/2015 14:42   Dg Chest Portable 1 View  11/23/2015  CLINICAL DATA:  Cough and congestion.  Chronic renal failure EXAM: PORTABLE CHEST 1 VIEW COMPARISON:  Chest radiograph and chest CT September 11, 2015 FINDINGS: There is no apparent edema or consolidation. There is generalized cardiac enlargement with pulmonary vascularity within normal limits. No adenopathy. Central catheter tip is in the superior vena cava. No pneumothorax. IMPRESSION: Generalized cardiac enlargement. Cannot exclude underlying pericardial effusion. No edema or consolidation. No pneumothorax. Electronically Signed   By: Bretta Bang III M.D.   On: 11/23/2015 13:49   Dg Abd Portable 1v  11/28/2015  CLINICAL DATA:  Pain with nausea and vomiting EXAM: PORTABLE ABDOMEN - 1 VIEW COMPARISON:  None.  FINDINGS: There is a paucity of gas in the abdomen and pelvis. There is no bowel dilatation or air-fluid level suggesting obstruction. No free air. Lung bases are clear. There is a small phlebolith in the pelvis. IMPRESSION: Paucity of gas in the abdomen or pelvis. While this finding may be seen normally, it raises question of ileus or enteritis. Obstruction not felt to be likely. No free air is seen on this supine examination. Lung bases clear. Electronically Signed   By: Bretta Bang III M.D.   On: 11/28/2015 15:36   US Abdomen Limited Ruq  12/02/2015  CLINICAL DATA:  Elevated liver function tests, history of HIV and end-stage renal disease EXAM: US ABDOMEN LIMITED - RIGHT UPPER QUADRANT COMPARISON:  None. FINDINGS: Gallbladder: There is gallbladder wall thickening to about 1 cm. There is pericholecystic fluid. There is sludge within the gallbladder. There is no sonographic Murphy sign. There is no shadowing calculus. Common bile duct: Diameter: 3 mm Liver: No focal lesion identified. Within normal limits in parenchymal echogenicity. Minimal ascites is present. Right kidney is noted to be of increased echogenicity. IMPRESSION: 1. Gallbladder wall thickening. In the presence of ascites this is a nonspecific finding. The possibility of acalculous cholecystitis is not excluded. 2. Very small volume of ascites 3. Medical renal disease Electronically Signed   By: Esperanza Heir M.D.   On: 12/02/2015 14:02    Labs:  CBC:  Recent Labs  12/01/15 0706 12/02/15 0532 12/03/15 0827 12/06/15 0709  WBC 8.3 8.0 7.8 10.2  HGB 12.6* 12.7* 13.6 12.5*  HCT 36.4* 36.1* 37.1* 36.8*  PLT 113* 131* 137* 175    COAGS:  Recent Labs  03/01/15 1738 09/11/15 0512 11/24/15 1440 11/29/15 0932 12/06/15 0709  INR 1.21 1.21 1.92* 1.80* 1.19  APTT 31 31 35  --  31    BMP:  Recent Labs  12/01/15 0706 12/02/15 0532 12/03/15 0827 12/06/15 0709  NA 135 136 135 139  K 3.6 3.6 4.4 4.1  CL 97* 97* 97*  108  CO2 24 25 21* 20*  GLUCOSE 109* 99 94 114*  BUN 47* 30* 45* 41*  CALCIUM 8.2* 7.9* 8.2* 7.9*  CREATININE 8.92* 6.38* 8.18* 9.11*  GFRNONAA 7* 11* 8* 7*  GFRAA 8* 13* 9* 8*    LIVER FUNCTION TESTS:  Recent Labs  11/24/15 0839 11/29/15 1618 12/01/15 0706 12/02/15 0532 12/03/15 0827  BILITOT 3.9* 3.8* 3.6* 3.4*  --   AST 62* 262* 135* 100*  --   ALT 34 30 13* 10*  --   ALKPHOS 65 69 81 99  --   PROT 7.0 6.1* 6.5 6.8  --   ALBUMIN 3.3* 2.4*  2.6* 2.7* 2.8* 2.9*    TUMOR MARKERS: No results for input(s): AFPTM, CEA, CA199, CHROMGRNA in the last 8760 hours.  Assessment and Plan:  ESRD HIV Recent staph aureus bacteremia---PC removed 2/12 New temp cath placed 2/13 in IR--Dr [facility] Now for tunneled cath placement Risks and  Benefits discussed with the patient including, but not limited to bleeding, infection, vascular injury, pneumothorax which may require chest tube placement, air embolism or even death All of the patient's questions were answered, patient is agreeable to proceed. Consent signed and in chart.   Thank you for this interesting consult.  I greatly enjoyed meeting Dave Estrada and look forward to participating in their care.  A copy of this report was sent to the requesting provider on this date.  Electronically Signed: Ralene Muskrat A 12/06/2015, 7:55 AM   I spent a total of  30 Minutes   in face to face in clinical consultation, greater than 50% of which was counseling/coordinating care for HD Catheter placement

## 2015-12-06 NOTE — Procedures (Signed)
Interventional Radiology Procedure Note  Procedure: Placement of a right IJ approach 19cm tip to cuff HD catheter.  Tip is positioned at the superior cavoatrial junction and catheter is ready for immediate use.  Complications: None Recommendations:  - Ok to use catheter - Do not submerge for 7 days - Routine line care   Signed,  Yvone Neu. Loreta Ave, DO

## 2015-12-06 NOTE — Sedation Documentation (Addendum)
Patient is resting comfortably. Pt reporting discomfort with Numbing, will give additional medication as discussed with Dr. Loreta Ave

## 2015-12-07 DIAGNOSIS — L299 Pruritus, unspecified: Secondary | ICD-10-CM | POA: Diagnosis not present

## 2015-12-07 DIAGNOSIS — A4902 Methicillin resistant Staphylococcus aureus infection, unspecified site: Secondary | ICD-10-CM | POA: Diagnosis not present

## 2015-12-07 DIAGNOSIS — N2581 Secondary hyperparathyroidism of renal origin: Secondary | ICD-10-CM | POA: Diagnosis not present

## 2015-12-07 DIAGNOSIS — D631 Anemia in chronic kidney disease: Secondary | ICD-10-CM | POA: Diagnosis not present

## 2015-12-07 DIAGNOSIS — N186 End stage renal disease: Secondary | ICD-10-CM | POA: Diagnosis not present

## 2015-12-07 LAB — CULTURE, BLOOD (ROUTINE X 2)
CULTURE: NO GROWTH
Culture: NO GROWTH

## 2015-12-08 ENCOUNTER — Telehealth: Payer: Self-pay | Admitting: Cardiovascular Disease

## 2015-12-08 NOTE — Telephone Encounter (Signed)
New Message   Pt requested to speak w/ RN. Dr Kirtland Bouchard ordered an MRI, and the pt has since been to the hopsital- he wants to know if he needs to still have the test done. Please call back and discuss.

## 2015-12-08 NOTE — Telephone Encounter (Signed)
Spoke with pt and informed that he will still need MRI to be done

## 2015-12-09 DIAGNOSIS — A4902 Methicillin resistant Staphylococcus aureus infection, unspecified site: Secondary | ICD-10-CM | POA: Diagnosis not present

## 2015-12-09 DIAGNOSIS — D631 Anemia in chronic kidney disease: Secondary | ICD-10-CM | POA: Diagnosis not present

## 2015-12-09 DIAGNOSIS — N186 End stage renal disease: Secondary | ICD-10-CM | POA: Diagnosis not present

## 2015-12-09 DIAGNOSIS — N2581 Secondary hyperparathyroidism of renal origin: Secondary | ICD-10-CM | POA: Diagnosis not present

## 2015-12-09 DIAGNOSIS — L299 Pruritus, unspecified: Secondary | ICD-10-CM | POA: Diagnosis not present

## 2015-12-11 DIAGNOSIS — A4902 Methicillin resistant Staphylococcus aureus infection, unspecified site: Secondary | ICD-10-CM | POA: Diagnosis not present

## 2015-12-11 DIAGNOSIS — N186 End stage renal disease: Secondary | ICD-10-CM | POA: Diagnosis not present

## 2015-12-11 DIAGNOSIS — D631 Anemia in chronic kidney disease: Secondary | ICD-10-CM | POA: Diagnosis not present

## 2015-12-11 DIAGNOSIS — L299 Pruritus, unspecified: Secondary | ICD-10-CM | POA: Diagnosis not present

## 2015-12-11 DIAGNOSIS — N2581 Secondary hyperparathyroidism of renal origin: Secondary | ICD-10-CM | POA: Diagnosis not present

## 2015-12-14 DIAGNOSIS — A4902 Methicillin resistant Staphylococcus aureus infection, unspecified site: Secondary | ICD-10-CM | POA: Diagnosis not present

## 2015-12-14 DIAGNOSIS — Z992 Dependence on renal dialysis: Secondary | ICD-10-CM | POA: Diagnosis not present

## 2015-12-14 DIAGNOSIS — N186 End stage renal disease: Secondary | ICD-10-CM | POA: Diagnosis not present

## 2015-12-14 DIAGNOSIS — D631 Anemia in chronic kidney disease: Secondary | ICD-10-CM | POA: Diagnosis not present

## 2015-12-14 DIAGNOSIS — L299 Pruritus, unspecified: Secondary | ICD-10-CM | POA: Diagnosis not present

## 2015-12-14 DIAGNOSIS — N2581 Secondary hyperparathyroidism of renal origin: Secondary | ICD-10-CM | POA: Diagnosis not present

## 2015-12-16 DIAGNOSIS — N2581 Secondary hyperparathyroidism of renal origin: Secondary | ICD-10-CM | POA: Diagnosis not present

## 2015-12-16 DIAGNOSIS — D509 Iron deficiency anemia, unspecified: Secondary | ICD-10-CM | POA: Diagnosis not present

## 2015-12-16 DIAGNOSIS — N186 End stage renal disease: Secondary | ICD-10-CM | POA: Diagnosis not present

## 2015-12-16 DIAGNOSIS — A4902 Methicillin resistant Staphylococcus aureus infection, unspecified site: Secondary | ICD-10-CM | POA: Diagnosis not present

## 2015-12-16 DIAGNOSIS — I10 Essential (primary) hypertension: Secondary | ICD-10-CM | POA: Diagnosis not present

## 2015-12-16 DIAGNOSIS — Z23 Encounter for immunization: Secondary | ICD-10-CM | POA: Diagnosis not present

## 2015-12-16 DIAGNOSIS — L299 Pruritus, unspecified: Secondary | ICD-10-CM | POA: Diagnosis not present

## 2015-12-16 DIAGNOSIS — D696 Thrombocytopenia, unspecified: Secondary | ICD-10-CM | POA: Diagnosis not present

## 2015-12-16 DIAGNOSIS — D631 Anemia in chronic kidney disease: Secondary | ICD-10-CM | POA: Diagnosis not present

## 2015-12-17 ENCOUNTER — Telehealth: Payer: Self-pay | Admitting: Cardiovascular Disease

## 2015-12-17 NOTE — Telephone Encounter (Signed)
Dena a nurse @ [facility] called and lvm stating that they're needing a cardiac clearance written on this pt so he can have a fistula removed from his arm on 3/24. Fax # [phone] and  Tele # [phone]

## 2015-12-17 NOTE — Telephone Encounter (Signed)
Will forward to Dr. Koneswaran  

## 2015-12-18 DIAGNOSIS — N186 End stage renal disease: Secondary | ICD-10-CM | POA: Diagnosis not present

## 2015-12-18 DIAGNOSIS — L299 Pruritus, unspecified: Secondary | ICD-10-CM | POA: Diagnosis not present

## 2015-12-18 DIAGNOSIS — A4902 Methicillin resistant Staphylococcus aureus infection, unspecified site: Secondary | ICD-10-CM | POA: Diagnosis not present

## 2015-12-18 DIAGNOSIS — Z23 Encounter for immunization: Secondary | ICD-10-CM | POA: Diagnosis not present

## 2015-12-18 DIAGNOSIS — N2581 Secondary hyperparathyroidism of renal origin: Secondary | ICD-10-CM | POA: Diagnosis not present

## 2015-12-20 NOTE — Telephone Encounter (Signed)
Call Documentation      Laqueta Linden, MD at 12/20/2015 10:11 AM     Status: Signed       Expand All Collapse All   Can proceed. Awaiting cardiac MRI. Elevated perioperative risk due to severely reduced LV function.           Faxed as requested to [facility] letter

## 2015-12-20 NOTE — Telephone Encounter (Signed)
Can proceed. Awaiting cardiac MRI. Elevated perioperative risk due to severely reduced LV function.

## 2015-12-21 ENCOUNTER — Telehealth: Payer: Self-pay | Admitting: Cardiovascular Disease

## 2015-12-21 DIAGNOSIS — N186 End stage renal disease: Secondary | ICD-10-CM | POA: Diagnosis not present

## 2015-12-21 DIAGNOSIS — Z23 Encounter for immunization: Secondary | ICD-10-CM | POA: Diagnosis not present

## 2015-12-21 DIAGNOSIS — N2581 Secondary hyperparathyroidism of renal origin: Secondary | ICD-10-CM | POA: Diagnosis not present

## 2015-12-21 DIAGNOSIS — L299 Pruritus, unspecified: Secondary | ICD-10-CM | POA: Diagnosis not present

## 2015-12-21 DIAGNOSIS — A4902 Methicillin resistant Staphylococcus aureus infection, unspecified site: Secondary | ICD-10-CM | POA: Diagnosis not present

## 2015-12-21 NOTE — Telephone Encounter (Signed)
pls call Loraine Leriche @ [facility] [phone] concerning the pt's surgery

## 2015-12-21 NOTE — Telephone Encounter (Signed)
I spoke with mark at [facility] confirmed that we sent pre-op note which he later found already scanned in system

## 2015-12-23 DIAGNOSIS — L299 Pruritus, unspecified: Secondary | ICD-10-CM | POA: Diagnosis not present

## 2015-12-23 DIAGNOSIS — N2581 Secondary hyperparathyroidism of renal origin: Secondary | ICD-10-CM | POA: Diagnosis not present

## 2015-12-23 DIAGNOSIS — A4902 Methicillin resistant Staphylococcus aureus infection, unspecified site: Secondary | ICD-10-CM | POA: Diagnosis not present

## 2015-12-23 DIAGNOSIS — N186 End stage renal disease: Secondary | ICD-10-CM | POA: Diagnosis not present

## 2015-12-23 DIAGNOSIS — Z23 Encounter for immunization: Secondary | ICD-10-CM | POA: Diagnosis not present

## 2015-12-25 DIAGNOSIS — N2581 Secondary hyperparathyroidism of renal origin: Secondary | ICD-10-CM | POA: Diagnosis not present

## 2015-12-25 DIAGNOSIS — Z23 Encounter for immunization: Secondary | ICD-10-CM | POA: Diagnosis not present

## 2015-12-25 DIAGNOSIS — A4902 Methicillin resistant Staphylococcus aureus infection, unspecified site: Secondary | ICD-10-CM | POA: Diagnosis not present

## 2015-12-25 DIAGNOSIS — L299 Pruritus, unspecified: Secondary | ICD-10-CM | POA: Diagnosis not present

## 2015-12-25 DIAGNOSIS — N186 End stage renal disease: Secondary | ICD-10-CM | POA: Diagnosis not present

## 2015-12-28 DIAGNOSIS — N2581 Secondary hyperparathyroidism of renal origin: Secondary | ICD-10-CM | POA: Diagnosis not present

## 2015-12-28 DIAGNOSIS — A4902 Methicillin resistant Staphylococcus aureus infection, unspecified site: Secondary | ICD-10-CM | POA: Diagnosis not present

## 2015-12-28 DIAGNOSIS — N186 End stage renal disease: Secondary | ICD-10-CM | POA: Diagnosis not present

## 2015-12-28 DIAGNOSIS — Z23 Encounter for immunization: Secondary | ICD-10-CM | POA: Diagnosis not present

## 2015-12-28 DIAGNOSIS — L299 Pruritus, unspecified: Secondary | ICD-10-CM | POA: Diagnosis not present

## 2015-12-30 DIAGNOSIS — A4902 Methicillin resistant Staphylococcus aureus infection, unspecified site: Secondary | ICD-10-CM | POA: Diagnosis not present

## 2015-12-30 DIAGNOSIS — N186 End stage renal disease: Secondary | ICD-10-CM | POA: Diagnosis not present

## 2015-12-30 DIAGNOSIS — N2581 Secondary hyperparathyroidism of renal origin: Secondary | ICD-10-CM | POA: Diagnosis not present

## 2015-12-30 DIAGNOSIS — L299 Pruritus, unspecified: Secondary | ICD-10-CM | POA: Diagnosis not present

## 2015-12-30 DIAGNOSIS — Z23 Encounter for immunization: Secondary | ICD-10-CM | POA: Diagnosis not present

## 2016-01-01 DIAGNOSIS — N186 End stage renal disease: Secondary | ICD-10-CM | POA: Diagnosis not present

## 2016-01-01 DIAGNOSIS — A4902 Methicillin resistant Staphylococcus aureus infection, unspecified site: Secondary | ICD-10-CM | POA: Diagnosis not present

## 2016-01-01 DIAGNOSIS — N2581 Secondary hyperparathyroidism of renal origin: Secondary | ICD-10-CM | POA: Diagnosis not present

## 2016-01-01 DIAGNOSIS — Z23 Encounter for immunization: Secondary | ICD-10-CM | POA: Diagnosis not present

## 2016-01-01 DIAGNOSIS — L299 Pruritus, unspecified: Secondary | ICD-10-CM | POA: Diagnosis not present

## 2016-01-04 DIAGNOSIS — Z23 Encounter for immunization: Secondary | ICD-10-CM | POA: Diagnosis not present

## 2016-01-04 DIAGNOSIS — N186 End stage renal disease: Secondary | ICD-10-CM | POA: Diagnosis not present

## 2016-01-04 DIAGNOSIS — L299 Pruritus, unspecified: Secondary | ICD-10-CM | POA: Diagnosis not present

## 2016-01-04 DIAGNOSIS — N2581 Secondary hyperparathyroidism of renal origin: Secondary | ICD-10-CM | POA: Diagnosis not present

## 2016-01-04 DIAGNOSIS — A4902 Methicillin resistant Staphylococcus aureus infection, unspecified site: Secondary | ICD-10-CM | POA: Diagnosis not present

## 2016-01-06 DIAGNOSIS — N186 End stage renal disease: Secondary | ICD-10-CM | POA: Diagnosis not present

## 2016-01-06 DIAGNOSIS — N2581 Secondary hyperparathyroidism of renal origin: Secondary | ICD-10-CM | POA: Diagnosis not present

## 2016-01-06 DIAGNOSIS — A4902 Methicillin resistant Staphylococcus aureus infection, unspecified site: Secondary | ICD-10-CM | POA: Diagnosis not present

## 2016-01-06 DIAGNOSIS — L299 Pruritus, unspecified: Secondary | ICD-10-CM | POA: Diagnosis not present

## 2016-01-06 DIAGNOSIS — Z23 Encounter for immunization: Secondary | ICD-10-CM | POA: Diagnosis not present

## 2016-01-08 DIAGNOSIS — A4902 Methicillin resistant Staphylococcus aureus infection, unspecified site: Secondary | ICD-10-CM | POA: Diagnosis not present

## 2016-01-08 DIAGNOSIS — N2581 Secondary hyperparathyroidism of renal origin: Secondary | ICD-10-CM | POA: Diagnosis not present

## 2016-01-08 DIAGNOSIS — Z23 Encounter for immunization: Secondary | ICD-10-CM | POA: Diagnosis not present

## 2016-01-08 DIAGNOSIS — N186 End stage renal disease: Secondary | ICD-10-CM | POA: Diagnosis not present

## 2016-01-08 DIAGNOSIS — L299 Pruritus, unspecified: Secondary | ICD-10-CM | POA: Diagnosis not present

## 2016-01-11 DIAGNOSIS — Z23 Encounter for immunization: Secondary | ICD-10-CM | POA: Diagnosis not present

## 2016-01-11 DIAGNOSIS — N186 End stage renal disease: Secondary | ICD-10-CM | POA: Diagnosis not present

## 2016-01-11 DIAGNOSIS — L299 Pruritus, unspecified: Secondary | ICD-10-CM | POA: Diagnosis not present

## 2016-01-11 DIAGNOSIS — A4902 Methicillin resistant Staphylococcus aureus infection, unspecified site: Secondary | ICD-10-CM | POA: Diagnosis not present

## 2016-01-11 DIAGNOSIS — N2581 Secondary hyperparathyroidism of renal origin: Secondary | ICD-10-CM | POA: Diagnosis not present

## 2016-01-12 ENCOUNTER — Encounter (HOSPITAL_COMMUNITY): Payer: Self-pay | Admitting: Internal Medicine

## 2016-01-12 ENCOUNTER — Ambulatory Visit (HOSPITAL_COMMUNITY)
Admission: RE | Admit: 2016-01-12 | Discharge: 2016-01-12 | Disposition: A | Discharge status: Home / Self Care | Payer: Medicare Other | Source: Ambulatory Visit | Attending: Internal Medicine | Admitting: Internal Medicine

## 2016-01-12 VITALS — BP 120/90 | HR 117 | Wt 116.0 lb

## 2016-01-12 DIAGNOSIS — I5022 Chronic systolic (congestive) heart failure: Secondary | ICD-10-CM | POA: Diagnosis not present

## 2016-01-12 DIAGNOSIS — Z79899 Other long term (current) drug therapy: Secondary | ICD-10-CM | POA: Insufficient documentation

## 2016-01-12 DIAGNOSIS — J45909 Unspecified asthma, uncomplicated: Secondary | ICD-10-CM | POA: Diagnosis not present

## 2016-01-12 DIAGNOSIS — I428 Other cardiomyopathies: Secondary | ICD-10-CM

## 2016-01-12 DIAGNOSIS — F17210 Nicotine dependence, cigarettes, uncomplicated: Secondary | ICD-10-CM | POA: Insufficient documentation

## 2016-01-12 DIAGNOSIS — N186 End stage renal disease: Secondary | ICD-10-CM | POA: Insufficient documentation

## 2016-01-12 DIAGNOSIS — Z884 Allergy status to anesthetic agent status: Secondary | ICD-10-CM | POA: Insufficient documentation

## 2016-01-12 DIAGNOSIS — B20 Human immunodeficiency virus [HIV] disease: Secondary | ICD-10-CM | POA: Insufficient documentation

## 2016-01-12 DIAGNOSIS — I429 Cardiomyopathy, unspecified: Secondary | ICD-10-CM | POA: Diagnosis not present

## 2016-01-12 DIAGNOSIS — Z992 Dependence on renal dialysis: Secondary | ICD-10-CM | POA: Insufficient documentation

## 2016-01-12 MED ORDER — METOPROLOL SUCCINATE ER 50 MG PO TB24: ORAL_TABLET | ORAL | Status: DC

## 2016-01-12 NOTE — Patient Instructions (Signed)
INCREASE Toprol to 75 mg (one and one half tab) twice a day  Your physician has requested that you have an echocardiogram. Echocardiography is a painless test that uses sound waves to create images of your heart. It provides your doctor with information about the size and shape of your heart and how well your heart's chambers and valves are working. This procedure takes approximately one hour. There are no restrictions for this procedure.  Your physician recommends that you schedule a follow-up appointment as needed

## 2016-01-12 NOTE — Progress Notes (Signed)
Patient ID: Dave Estrada, male   DOB: 1991-09-26, 25 y.o.   MRN: [id number] Patient ID: Dave Estrada, male   DOB: 1991/07/10, 25 y.o.   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 He is referred for HF consultation by Dr. Purvis Sheffield.   He has been followed by infectious disease at [facility] on 4 drug therapy.  He was diagnosed with HF by Dr. Earna Coder in [place] in 2014. Did not have heart cath. Did wear LifeVest for 90 days. Subsequently has been followed by Dr. Purvis Sheffield  Has had extensive with infections with HD access. Had with vegetation noted on dialysis catheter in 08/2014 by TEE for which he received 6 weeks of IV vancomycin and had previous HD catheter removed. EF40% at that time. Had another catheter placed on 09/15/14 and AV graft placed in 10/2014. In March 2016 he was admitted with Staph Aureus bacteremia (MRSA). ID had evaluated pt and initially recommended a TEE be repeated to exclude valvular vegetations. However, since 6 weeks of IV antimicrobial therapy was planned and the catheter was removed, it was decided not to proceed with a TEE as it would not significantly change management. TTE at that time with EF 20%.    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  He was readmitted 2/17 with bacteremia and received IV abx. Echo nt  performed.  Says he does ok. Says he can walk a mile slowly without too much problem. However family member came in and said he is very fatigued and can't do much. No orthopnea or PND. Edema controlled with HD. Compliant with meds. Doesn't weight himself. No chest pain. Occasional chest wall pain but no angina. BP usually around 115. Doesn't drop much in HD. Says he may have had slight fever recently. No chills. Denies IVDA or ETOH. Regular marijuana use.   Review of Systems: As per "subjective", otherwise negative.  Allergies  Allergen Reactions  . Bee Venom Anaphylaxis  . Other Anaphylaxis    mushrooms  . Aspirin Other (See Comments)    Reaction is unknown    Current Outpatient Prescriptions  Medication Sig Dispense Refill  . albuterol (PROVENTIL HFA;VENTOLIN HFA) 108 (90 BASE) MCG/ACT inhaler Inhale 1-2 puffs into the lungs every 6 (six) hours as needed for wheezing or shortness of breath.    Marland Kitchen emtricitabine (EMTRIVA) 200 MG capsule Take 200 mg by mouth 2 (two) times a week. Taken after dialysis on Tuesday and Thursday    . etravirine (INTELENCE) 100 MG tablet Take 200 mg by mouth every 12 (twelve) hours.    Marland Kitchen ibuprofen (ADVIL,MOTRIN) 200 MG tablet Take 200 mg by mouth every 6 (six) hours as needed for fever or moderate pain.    Marland Kitchen lisinopril (PRINIVIL,ZESTRIL) 2.5 MG tablet Take 1 tablet (2.5 mg total) by mouth daily. 90 tablet 3  . metoprolol succinate (TOPROL-XL) 50 MG 24 hr tablet Take 1 tablet (50 mg total) by mouth 2 (two) times daily. Take with or immediately following a meal.  180 tablet 3  . raltegravir (ISENTRESS) 400 MG tablet Take 400 mg by mouth 2 (two) times daily.    Marland Kitchen sulfamethoxazole-trimethoprim (BACTRIM,SEPTRA) 400-80 MG tablet Take 1 tablet by mouth 3 (three) times a week. To take after dialysis days    . tenofovir (VIREAD) 300 MG tablet Take 300 mg by mouth once a week. To be taken on Tuesday after dialysis    . vancomycin 500 mg in sodium chloride 0.9 % 100 mL Inject  500 mg into the vein Every Tuesday,Thursday,and Saturday with dialysis. Last dose 3/22.     No current facility-administered medications for this encounter.    Past Medical History  Diagnosis Date  . HIV (human immunodeficiency virus infection) (HCC)   . ESRD (end stage renal disease) (HCC)   . Staphylococcus aureus bacteremia with sepsis (HCC) 09/09/2014  . A-V fistula (HCC)     Placed on 10/27/2014 at [facility]; for future HD use.   . Systolic dysfunction, left ventricle 12/20/2014    EF 20%   . Asthma     Past Surgical History  Procedure Laterality Date  . Port a cath revision    . Tee without cardioversion N/A 09/14/2014    Procedure: TRANSESOPHAGEAL ECHOCARDIOGRAM (TEE);  Surgeon: Laurey Morale, MD;  Location: [facility] ENDOSCOPY;  Service: Cardiovascular;  Laterality: N/A;  will be at [facility] by mon  . Insertion of dialysis catheter Left 09/15/2014    Procedure: INSERTION OF DIALYSIS CATHETER;  Surgeon: Nada Libman, MD;  Location: [facility] OR;  Service: Vascular;  Laterality: Left;    Social History   Social History  . Marital Status: Single    Spouse Name: N/A  . Number of Children: N/A  . Years of Education: N/A   Occupational History  . Not on file.   Social History Main Topics  . Smoking status: Current Every Day Smoker -- 1.00 packs/day for 11 years    Types: Cigarettes    Start date: 01/28/2003  . Smokeless tobacco: Never Used  . Alcohol Use: No  . Drug Use: Yes    Special: Marijuana  . Sexual Activity: Not Currently   Other Topics Concern  . Not on file   Social History Narrative     Filed Vitals:   01/12/16 1104  BP: 120/90  Pulse: 117  Weight: 116 lb (52.617 kg)  SpO2: 99%    PHYSICAL EXAM General: NAD. Thin chronically ill-appearing HEENT: Normal. Neck: Supple JVP 7, no thyromegaly. Lungs: clear CV: PMI laterally displaced. Tachy regular. No s3. Right upper chest perm cath Abdomen: Soft, nontender, no distention.  Neurologic: Alert and oriented.   Psych: Normal affect. Skin: Lower extremity stasis dermatitis. Musculoskeletal: No gross deformities.  ECG: Sinus tach      ASSESSMENT AND PLAN: 1.Chronic systolic heart failure, EF 10-15%: Agree with Dr. Purvis Sheffield that this is probably nonischemic and related to HIV. MRI is being planned but given ESRD cannot use gadolinium so will be of limited use. No clear evidence of non-compaction on echo -> will repeat. -He reports NYHA I-II symptoms but based on family member's description more NYHA III -Volume status controlled with HD -Remains tachycardic which is poor prognostic factor.  - Will increase Toprol-XL to 75 mg bid. Continue lisinopril 2.5 mg daily - would not push ACE hard given high risk of hyperkalemia with HD. - Can consider ivabradine - has been shown to be safe in ESRD patients - Long talk about advanced HF options but given ESRD and advanced HIV he  is not candidate for VAD or transplant (even dual organ) - Given propensity for infection, likely a poor candidate for ICD.  2. Chronic HIV disease: Followed by [facility], on four drug therapy.  3. ESRD, on hemodialysis: Followed by Dr. Kristian Covey.   4. Recent bacteremia - If develops recurrent fevers will need TEE  Given lack of advanced HF options and inability to titrate meds much further will have him f/u in [facility] Clinic PRN.  Can consider ivabradine at some point. Prognosis unfortunately seems poor.   Lesly Joslyn,MD 1:10 AM

## 2016-01-13 DIAGNOSIS — L299 Pruritus, unspecified: Secondary | ICD-10-CM | POA: Diagnosis not present

## 2016-01-13 DIAGNOSIS — N2581 Secondary hyperparathyroidism of renal origin: Secondary | ICD-10-CM | POA: Diagnosis not present

## 2016-01-13 DIAGNOSIS — Z23 Encounter for immunization: Secondary | ICD-10-CM | POA: Diagnosis not present

## 2016-01-13 DIAGNOSIS — I5022 Chronic systolic (congestive) heart failure: Secondary | ICD-10-CM | POA: Insufficient documentation

## 2016-01-13 DIAGNOSIS — A4902 Methicillin resistant Staphylococcus aureus infection, unspecified site: Secondary | ICD-10-CM | POA: Diagnosis not present

## 2016-01-13 DIAGNOSIS — N186 End stage renal disease: Secondary | ICD-10-CM | POA: Diagnosis not present

## 2016-01-14 DIAGNOSIS — N186 End stage renal disease: Secondary | ICD-10-CM | POA: Diagnosis not present

## 2016-01-14 DIAGNOSIS — Z992 Dependence on renal dialysis: Secondary | ICD-10-CM | POA: Diagnosis not present

## 2016-01-18 DIAGNOSIS — D509 Iron deficiency anemia, unspecified: Secondary | ICD-10-CM | POA: Diagnosis not present

## 2016-01-18 DIAGNOSIS — D696 Thrombocytopenia, unspecified: Secondary | ICD-10-CM | POA: Diagnosis not present

## 2016-01-18 DIAGNOSIS — T827XXA Infection and inflammatory reaction due to other cardiac and vascular devices, implants and grafts, initial encounter: Secondary | ICD-10-CM | POA: Diagnosis not present

## 2016-01-18 DIAGNOSIS — I10 Essential (primary) hypertension: Secondary | ICD-10-CM | POA: Diagnosis not present

## 2016-01-18 DIAGNOSIS — D631 Anemia in chronic kidney disease: Secondary | ICD-10-CM | POA: Diagnosis not present

## 2016-01-18 DIAGNOSIS — N2581 Secondary hyperparathyroidism of renal origin: Secondary | ICD-10-CM | POA: Diagnosis not present

## 2016-01-18 DIAGNOSIS — N186 End stage renal disease: Secondary | ICD-10-CM | POA: Diagnosis not present

## 2016-01-19 ENCOUNTER — Ambulatory Visit (HOSPITAL_COMMUNITY)
Admission: RE | Admit: 2016-01-19 | Discharge: 2016-01-19 | Disposition: A | Discharge status: Home / Self Care | Payer: Medicare Other | Source: Ambulatory Visit | Attending: Internal Medicine | Admitting: Internal Medicine

## 2016-01-19 DIAGNOSIS — B20 Human immunodeficiency virus [HIV] disease: Secondary | ICD-10-CM | POA: Insufficient documentation

## 2016-01-19 DIAGNOSIS — I429 Cardiomyopathy, unspecified: Secondary | ICD-10-CM | POA: Diagnosis not present

## 2016-01-19 DIAGNOSIS — Z720 Tobacco use: Secondary | ICD-10-CM | POA: Diagnosis not present

## 2016-01-19 DIAGNOSIS — I340 Nonrheumatic mitral (valve) insufficiency: Secondary | ICD-10-CM | POA: Insufficient documentation

## 2016-01-19 DIAGNOSIS — I351 Nonrheumatic aortic (valve) insufficiency: Secondary | ICD-10-CM | POA: Insufficient documentation

## 2016-01-19 DIAGNOSIS — I371 Nonrheumatic pulmonary valve insufficiency: Secondary | ICD-10-CM | POA: Insufficient documentation

## 2016-01-19 DIAGNOSIS — I517 Cardiomegaly: Secondary | ICD-10-CM | POA: Diagnosis not present

## 2016-01-19 DIAGNOSIS — I509 Heart failure, unspecified: Secondary | ICD-10-CM | POA: Diagnosis not present

## 2016-01-19 DIAGNOSIS — I313 Pericardial effusion (noninflammatory): Secondary | ICD-10-CM | POA: Insufficient documentation

## 2016-01-19 DIAGNOSIS — I428 Other cardiomyopathies: Secondary | ICD-10-CM

## 2016-01-19 DIAGNOSIS — I071 Rheumatic tricuspid insufficiency: Secondary | ICD-10-CM | POA: Diagnosis not present

## 2016-01-20 DIAGNOSIS — D631 Anemia in chronic kidney disease: Secondary | ICD-10-CM | POA: Diagnosis not present

## 2016-01-20 DIAGNOSIS — D509 Iron deficiency anemia, unspecified: Secondary | ICD-10-CM | POA: Diagnosis not present

## 2016-01-20 DIAGNOSIS — T827XXA Infection and inflammatory reaction due to other cardiac and vascular devices, implants and grafts, initial encounter: Secondary | ICD-10-CM | POA: Diagnosis not present

## 2016-01-20 DIAGNOSIS — N2581 Secondary hyperparathyroidism of renal origin: Secondary | ICD-10-CM | POA: Diagnosis not present

## 2016-01-20 DIAGNOSIS — N186 End stage renal disease: Secondary | ICD-10-CM | POA: Diagnosis not present

## 2016-01-22 DIAGNOSIS — D509 Iron deficiency anemia, unspecified: Secondary | ICD-10-CM | POA: Diagnosis not present

## 2016-01-22 DIAGNOSIS — T827XXA Infection and inflammatory reaction due to other cardiac and vascular devices, implants and grafts, initial encounter: Secondary | ICD-10-CM | POA: Diagnosis not present

## 2016-01-22 DIAGNOSIS — D631 Anemia in chronic kidney disease: Secondary | ICD-10-CM | POA: Diagnosis not present

## 2016-01-22 DIAGNOSIS — N2581 Secondary hyperparathyroidism of renal origin: Secondary | ICD-10-CM | POA: Diagnosis not present

## 2016-01-22 DIAGNOSIS — N186 End stage renal disease: Secondary | ICD-10-CM | POA: Diagnosis not present

## 2016-01-24 DIAGNOSIS — B20 Human immunodeficiency virus [HIV] disease: Secondary | ICD-10-CM | POA: Diagnosis not present

## 2016-01-24 DIAGNOSIS — Z79899 Other long term (current) drug therapy: Secondary | ICD-10-CM | POA: Diagnosis not present

## 2016-01-24 DIAGNOSIS — N186 End stage renal disease: Secondary | ICD-10-CM | POA: Diagnosis not present

## 2016-01-24 DIAGNOSIS — Z992 Dependence on renal dialysis: Secondary | ICD-10-CM | POA: Diagnosis not present

## 2016-01-24 DIAGNOSIS — Z719 Counseling, unspecified: Secondary | ICD-10-CM | POA: Diagnosis not present

## 2016-01-25 DIAGNOSIS — N2581 Secondary hyperparathyroidism of renal origin: Secondary | ICD-10-CM | POA: Diagnosis not present

## 2016-01-25 DIAGNOSIS — D631 Anemia in chronic kidney disease: Secondary | ICD-10-CM | POA: Diagnosis not present

## 2016-01-25 DIAGNOSIS — N186 End stage renal disease: Secondary | ICD-10-CM | POA: Diagnosis not present

## 2016-01-25 DIAGNOSIS — D509 Iron deficiency anemia, unspecified: Secondary | ICD-10-CM | POA: Diagnosis not present

## 2016-01-25 DIAGNOSIS — T827XXA Infection and inflammatory reaction due to other cardiac and vascular devices, implants and grafts, initial encounter: Secondary | ICD-10-CM | POA: Diagnosis not present

## 2016-01-27 DIAGNOSIS — D509 Iron deficiency anemia, unspecified: Secondary | ICD-10-CM | POA: Diagnosis not present

## 2016-01-27 DIAGNOSIS — N186 End stage renal disease: Secondary | ICD-10-CM | POA: Diagnosis not present

## 2016-01-27 DIAGNOSIS — T827XXA Infection and inflammatory reaction due to other cardiac and vascular devices, implants and grafts, initial encounter: Secondary | ICD-10-CM | POA: Diagnosis not present

## 2016-01-27 DIAGNOSIS — D631 Anemia in chronic kidney disease: Secondary | ICD-10-CM | POA: Diagnosis not present

## 2016-01-27 DIAGNOSIS — N2581 Secondary hyperparathyroidism of renal origin: Secondary | ICD-10-CM | POA: Diagnosis not present

## 2016-01-29 DIAGNOSIS — N2581 Secondary hyperparathyroidism of renal origin: Secondary | ICD-10-CM | POA: Diagnosis not present

## 2016-01-29 DIAGNOSIS — T827XXA Infection and inflammatory reaction due to other cardiac and vascular devices, implants and grafts, initial encounter: Secondary | ICD-10-CM | POA: Diagnosis not present

## 2016-01-29 DIAGNOSIS — D631 Anemia in chronic kidney disease: Secondary | ICD-10-CM | POA: Diagnosis not present

## 2016-01-29 DIAGNOSIS — N186 End stage renal disease: Secondary | ICD-10-CM | POA: Diagnosis not present

## 2016-01-29 DIAGNOSIS — D509 Iron deficiency anemia, unspecified: Secondary | ICD-10-CM | POA: Diagnosis not present

## 2016-02-01 DIAGNOSIS — D631 Anemia in chronic kidney disease: Secondary | ICD-10-CM | POA: Diagnosis not present

## 2016-02-01 DIAGNOSIS — D509 Iron deficiency anemia, unspecified: Secondary | ICD-10-CM | POA: Diagnosis not present

## 2016-02-01 DIAGNOSIS — T827XXA Infection and inflammatory reaction due to other cardiac and vascular devices, implants and grafts, initial encounter: Secondary | ICD-10-CM | POA: Diagnosis not present

## 2016-02-01 DIAGNOSIS — N186 End stage renal disease: Secondary | ICD-10-CM | POA: Diagnosis not present

## 2016-02-01 DIAGNOSIS — N2581 Secondary hyperparathyroidism of renal origin: Secondary | ICD-10-CM | POA: Diagnosis not present

## 2016-02-03 DIAGNOSIS — D509 Iron deficiency anemia, unspecified: Secondary | ICD-10-CM | POA: Diagnosis not present

## 2016-02-03 DIAGNOSIS — N2581 Secondary hyperparathyroidism of renal origin: Secondary | ICD-10-CM | POA: Diagnosis not present

## 2016-02-03 DIAGNOSIS — T827XXA Infection and inflammatory reaction due to other cardiac and vascular devices, implants and grafts, initial encounter: Secondary | ICD-10-CM | POA: Diagnosis not present

## 2016-02-03 DIAGNOSIS — D631 Anemia in chronic kidney disease: Secondary | ICD-10-CM | POA: Diagnosis not present

## 2016-02-03 DIAGNOSIS — N186 End stage renal disease: Secondary | ICD-10-CM | POA: Diagnosis not present

## 2016-02-04 DIAGNOSIS — F17210 Nicotine dependence, cigarettes, uncomplicated: Secondary | ICD-10-CM | POA: Diagnosis not present

## 2016-02-04 DIAGNOSIS — I252 Old myocardial infarction: Secondary | ICD-10-CM | POA: Diagnosis not present

## 2016-02-04 DIAGNOSIS — Z992 Dependence on renal dialysis: Secondary | ICD-10-CM | POA: Diagnosis not present

## 2016-02-04 DIAGNOSIS — I429 Cardiomyopathy, unspecified: Secondary | ICD-10-CM | POA: Diagnosis not present

## 2016-02-04 DIAGNOSIS — I2510 Atherosclerotic heart disease of native coronary artery without angina pectoris: Secondary | ICD-10-CM | POA: Diagnosis not present

## 2016-02-04 DIAGNOSIS — L409 Psoriasis, unspecified: Secondary | ICD-10-CM | POA: Diagnosis not present

## 2016-02-04 DIAGNOSIS — N186 End stage renal disease: Secondary | ICD-10-CM | POA: Diagnosis not present

## 2016-02-04 DIAGNOSIS — I120 Hypertensive chronic kidney disease with stage 5 chronic kidney disease or end stage renal disease: Secondary | ICD-10-CM | POA: Diagnosis not present

## 2016-02-05 DIAGNOSIS — T827XXA Infection and inflammatory reaction due to other cardiac and vascular devices, implants and grafts, initial encounter: Secondary | ICD-10-CM | POA: Diagnosis not present

## 2016-02-05 DIAGNOSIS — N2581 Secondary hyperparathyroidism of renal origin: Secondary | ICD-10-CM | POA: Diagnosis not present

## 2016-02-05 DIAGNOSIS — D631 Anemia in chronic kidney disease: Secondary | ICD-10-CM | POA: Diagnosis not present

## 2016-02-05 DIAGNOSIS — N186 End stage renal disease: Secondary | ICD-10-CM | POA: Diagnosis not present

## 2016-02-05 DIAGNOSIS — D509 Iron deficiency anemia, unspecified: Secondary | ICD-10-CM | POA: Diagnosis not present

## 2016-02-10 DIAGNOSIS — D631 Anemia in chronic kidney disease: Secondary | ICD-10-CM | POA: Diagnosis not present

## 2016-02-10 DIAGNOSIS — N186 End stage renal disease: Secondary | ICD-10-CM | POA: Diagnosis not present

## 2016-02-10 DIAGNOSIS — D509 Iron deficiency anemia, unspecified: Secondary | ICD-10-CM | POA: Diagnosis not present

## 2016-02-10 DIAGNOSIS — T827XXA Infection and inflammatory reaction due to other cardiac and vascular devices, implants and grafts, initial encounter: Secondary | ICD-10-CM | POA: Diagnosis not present

## 2016-02-10 DIAGNOSIS — N2581 Secondary hyperparathyroidism of renal origin: Secondary | ICD-10-CM | POA: Diagnosis not present

## 2016-02-12 DIAGNOSIS — T827XXA Infection and inflammatory reaction due to other cardiac and vascular devices, implants and grafts, initial encounter: Secondary | ICD-10-CM | POA: Diagnosis not present

## 2016-02-12 DIAGNOSIS — D631 Anemia in chronic kidney disease: Secondary | ICD-10-CM | POA: Diagnosis not present

## 2016-02-12 DIAGNOSIS — N2581 Secondary hyperparathyroidism of renal origin: Secondary | ICD-10-CM | POA: Diagnosis not present

## 2016-02-12 DIAGNOSIS — N186 End stage renal disease: Secondary | ICD-10-CM | POA: Diagnosis not present

## 2016-02-12 DIAGNOSIS — D509 Iron deficiency anemia, unspecified: Secondary | ICD-10-CM | POA: Diagnosis not present

## 2016-02-13 DIAGNOSIS — N186 End stage renal disease: Secondary | ICD-10-CM | POA: Diagnosis not present

## 2016-02-13 DIAGNOSIS — Z992 Dependence on renal dialysis: Secondary | ICD-10-CM | POA: Diagnosis not present

## 2016-02-15 DIAGNOSIS — N2581 Secondary hyperparathyroidism of renal origin: Secondary | ICD-10-CM | POA: Diagnosis not present

## 2016-02-15 DIAGNOSIS — D696 Thrombocytopenia, unspecified: Secondary | ICD-10-CM | POA: Diagnosis not present

## 2016-02-15 DIAGNOSIS — D509 Iron deficiency anemia, unspecified: Secondary | ICD-10-CM | POA: Diagnosis not present

## 2016-02-15 DIAGNOSIS — I10 Essential (primary) hypertension: Secondary | ICD-10-CM | POA: Diagnosis not present

## 2016-02-15 DIAGNOSIS — N186 End stage renal disease: Secondary | ICD-10-CM | POA: Diagnosis not present

## 2016-02-15 DIAGNOSIS — D631 Anemia in chronic kidney disease: Secondary | ICD-10-CM | POA: Diagnosis not present

## 2016-02-17 DIAGNOSIS — I10 Essential (primary) hypertension: Secondary | ICD-10-CM | POA: Diagnosis not present

## 2016-02-17 DIAGNOSIS — N186 End stage renal disease: Secondary | ICD-10-CM | POA: Diagnosis not present

## 2016-02-17 DIAGNOSIS — N2581 Secondary hyperparathyroidism of renal origin: Secondary | ICD-10-CM | POA: Diagnosis not present

## 2016-02-17 DIAGNOSIS — D631 Anemia in chronic kidney disease: Secondary | ICD-10-CM | POA: Diagnosis not present

## 2016-02-17 DIAGNOSIS — D696 Thrombocytopenia, unspecified: Secondary | ICD-10-CM | POA: Diagnosis not present

## 2016-02-19 DIAGNOSIS — D696 Thrombocytopenia, unspecified: Secondary | ICD-10-CM | POA: Diagnosis not present

## 2016-02-19 DIAGNOSIS — N2581 Secondary hyperparathyroidism of renal origin: Secondary | ICD-10-CM | POA: Diagnosis not present

## 2016-02-19 DIAGNOSIS — D631 Anemia in chronic kidney disease: Secondary | ICD-10-CM | POA: Diagnosis not present

## 2016-02-19 DIAGNOSIS — I10 Essential (primary) hypertension: Secondary | ICD-10-CM | POA: Diagnosis not present

## 2016-02-19 DIAGNOSIS — N186 End stage renal disease: Secondary | ICD-10-CM | POA: Diagnosis not present

## 2016-02-22 DIAGNOSIS — I10 Essential (primary) hypertension: Secondary | ICD-10-CM | POA: Diagnosis not present

## 2016-02-22 DIAGNOSIS — N2581 Secondary hyperparathyroidism of renal origin: Secondary | ICD-10-CM | POA: Diagnosis not present

## 2016-02-22 DIAGNOSIS — D631 Anemia in chronic kidney disease: Secondary | ICD-10-CM | POA: Diagnosis not present

## 2016-02-22 DIAGNOSIS — N186 End stage renal disease: Secondary | ICD-10-CM | POA: Diagnosis not present

## 2016-02-22 DIAGNOSIS — D696 Thrombocytopenia, unspecified: Secondary | ICD-10-CM | POA: Diagnosis not present

## 2016-02-24 DIAGNOSIS — N186 End stage renal disease: Secondary | ICD-10-CM | POA: Diagnosis not present

## 2016-02-24 DIAGNOSIS — D631 Anemia in chronic kidney disease: Secondary | ICD-10-CM | POA: Diagnosis not present

## 2016-02-24 DIAGNOSIS — D696 Thrombocytopenia, unspecified: Secondary | ICD-10-CM | POA: Diagnosis not present

## 2016-02-24 DIAGNOSIS — I10 Essential (primary) hypertension: Secondary | ICD-10-CM | POA: Diagnosis not present

## 2016-02-24 DIAGNOSIS — N2581 Secondary hyperparathyroidism of renal origin: Secondary | ICD-10-CM | POA: Diagnosis not present

## 2016-02-25 DIAGNOSIS — Z992 Dependence on renal dialysis: Secondary | ICD-10-CM | POA: Diagnosis not present

## 2016-02-25 DIAGNOSIS — N186 End stage renal disease: Secondary | ICD-10-CM | POA: Diagnosis not present

## 2016-02-26 DIAGNOSIS — D696 Thrombocytopenia, unspecified: Secondary | ICD-10-CM | POA: Diagnosis not present

## 2016-02-26 DIAGNOSIS — D631 Anemia in chronic kidney disease: Secondary | ICD-10-CM | POA: Diagnosis not present

## 2016-02-26 DIAGNOSIS — N186 End stage renal disease: Secondary | ICD-10-CM | POA: Diagnosis not present

## 2016-02-26 DIAGNOSIS — N2581 Secondary hyperparathyroidism of renal origin: Secondary | ICD-10-CM | POA: Diagnosis not present

## 2016-02-26 DIAGNOSIS — I10 Essential (primary) hypertension: Secondary | ICD-10-CM | POA: Diagnosis not present

## 2016-02-29 DIAGNOSIS — N186 End stage renal disease: Secondary | ICD-10-CM | POA: Diagnosis not present

## 2016-02-29 DIAGNOSIS — D696 Thrombocytopenia, unspecified: Secondary | ICD-10-CM | POA: Diagnosis not present

## 2016-02-29 DIAGNOSIS — N2581 Secondary hyperparathyroidism of renal origin: Secondary | ICD-10-CM | POA: Diagnosis not present

## 2016-02-29 DIAGNOSIS — I10 Essential (primary) hypertension: Secondary | ICD-10-CM | POA: Diagnosis not present

## 2016-02-29 DIAGNOSIS — D631 Anemia in chronic kidney disease: Secondary | ICD-10-CM | POA: Diagnosis not present

## 2016-03-02 DIAGNOSIS — D631 Anemia in chronic kidney disease: Secondary | ICD-10-CM | POA: Diagnosis not present

## 2016-03-02 DIAGNOSIS — N2581 Secondary hyperparathyroidism of renal origin: Secondary | ICD-10-CM | POA: Diagnosis not present

## 2016-03-02 DIAGNOSIS — N186 End stage renal disease: Secondary | ICD-10-CM | POA: Diagnosis not present

## 2016-03-02 DIAGNOSIS — D696 Thrombocytopenia, unspecified: Secondary | ICD-10-CM | POA: Diagnosis not present

## 2016-03-02 DIAGNOSIS — I10 Essential (primary) hypertension: Secondary | ICD-10-CM | POA: Diagnosis not present

## 2016-03-07 DIAGNOSIS — N2581 Secondary hyperparathyroidism of renal origin: Secondary | ICD-10-CM | POA: Diagnosis not present

## 2016-03-07 DIAGNOSIS — N186 End stage renal disease: Secondary | ICD-10-CM | POA: Diagnosis not present

## 2016-03-07 DIAGNOSIS — D696 Thrombocytopenia, unspecified: Secondary | ICD-10-CM | POA: Diagnosis not present

## 2016-03-07 DIAGNOSIS — I10 Essential (primary) hypertension: Secondary | ICD-10-CM | POA: Diagnosis not present

## 2016-03-07 DIAGNOSIS — D631 Anemia in chronic kidney disease: Secondary | ICD-10-CM | POA: Diagnosis not present

## 2016-03-09 DIAGNOSIS — N186 End stage renal disease: Secondary | ICD-10-CM | POA: Diagnosis not present

## 2016-03-09 DIAGNOSIS — D696 Thrombocytopenia, unspecified: Secondary | ICD-10-CM | POA: Diagnosis not present

## 2016-03-09 DIAGNOSIS — N2581 Secondary hyperparathyroidism of renal origin: Secondary | ICD-10-CM | POA: Diagnosis not present

## 2016-03-09 DIAGNOSIS — I10 Essential (primary) hypertension: Secondary | ICD-10-CM | POA: Diagnosis not present

## 2016-03-09 DIAGNOSIS — D631 Anemia in chronic kidney disease: Secondary | ICD-10-CM | POA: Diagnosis not present

## 2016-03-11 DIAGNOSIS — I10 Essential (primary) hypertension: Secondary | ICD-10-CM | POA: Diagnosis not present

## 2016-03-11 DIAGNOSIS — N186 End stage renal disease: Secondary | ICD-10-CM | POA: Diagnosis not present

## 2016-03-11 DIAGNOSIS — D631 Anemia in chronic kidney disease: Secondary | ICD-10-CM | POA: Diagnosis not present

## 2016-03-11 DIAGNOSIS — N2581 Secondary hyperparathyroidism of renal origin: Secondary | ICD-10-CM | POA: Diagnosis not present

## 2016-03-11 DIAGNOSIS — D696 Thrombocytopenia, unspecified: Secondary | ICD-10-CM | POA: Diagnosis not present

## 2016-03-14 DIAGNOSIS — N2581 Secondary hyperparathyroidism of renal origin: Secondary | ICD-10-CM | POA: Diagnosis not present

## 2016-03-14 DIAGNOSIS — I10 Essential (primary) hypertension: Secondary | ICD-10-CM | POA: Diagnosis not present

## 2016-03-14 DIAGNOSIS — D696 Thrombocytopenia, unspecified: Secondary | ICD-10-CM | POA: Diagnosis not present

## 2016-03-14 DIAGNOSIS — N186 End stage renal disease: Secondary | ICD-10-CM | POA: Diagnosis not present

## 2016-03-14 DIAGNOSIS — D631 Anemia in chronic kidney disease: Secondary | ICD-10-CM | POA: Diagnosis not present

## 2016-03-15 DIAGNOSIS — B20 Human immunodeficiency virus [HIV] disease: Secondary | ICD-10-CM | POA: Diagnosis not present

## 2016-03-15 DIAGNOSIS — Z4901 Encounter for fitting and adjustment of extracorporeal dialysis catheter: Secondary | ICD-10-CM | POA: Diagnosis not present

## 2016-03-15 DIAGNOSIS — Z8614 Personal history of Methicillin resistant Staphylococcus aureus infection: Secondary | ICD-10-CM | POA: Diagnosis not present

## 2016-03-15 DIAGNOSIS — N186 End stage renal disease: Secondary | ICD-10-CM | POA: Diagnosis not present

## 2016-03-15 DIAGNOSIS — Z79899 Other long term (current) drug therapy: Secondary | ICD-10-CM | POA: Diagnosis not present

## 2016-03-15 DIAGNOSIS — I120 Hypertensive chronic kidney disease with stage 5 chronic kidney disease or end stage renal disease: Secondary | ICD-10-CM | POA: Diagnosis not present

## 2016-03-15 DIAGNOSIS — Z992 Dependence on renal dialysis: Secondary | ICD-10-CM | POA: Diagnosis not present

## 2016-03-15 DIAGNOSIS — F17210 Nicotine dependence, cigarettes, uncomplicated: Secondary | ICD-10-CM | POA: Diagnosis not present

## 2016-03-16 DIAGNOSIS — E875 Hyperkalemia: Secondary | ICD-10-CM | POA: Diagnosis not present

## 2016-03-16 DIAGNOSIS — N2581 Secondary hyperparathyroidism of renal origin: Secondary | ICD-10-CM | POA: Diagnosis not present

## 2016-03-16 DIAGNOSIS — D509 Iron deficiency anemia, unspecified: Secondary | ICD-10-CM | POA: Diagnosis not present

## 2016-03-16 DIAGNOSIS — I10 Essential (primary) hypertension: Secondary | ICD-10-CM | POA: Diagnosis not present

## 2016-03-16 DIAGNOSIS — Z4931 Encounter for adequacy testing for hemodialysis: Secondary | ICD-10-CM | POA: Diagnosis not present

## 2016-03-16 DIAGNOSIS — D631 Anemia in chronic kidney disease: Secondary | ICD-10-CM | POA: Diagnosis not present

## 2016-03-16 DIAGNOSIS — N186 End stage renal disease: Secondary | ICD-10-CM | POA: Diagnosis not present

## 2016-03-16 DIAGNOSIS — D696 Thrombocytopenia, unspecified: Secondary | ICD-10-CM | POA: Diagnosis not present

## 2016-03-18 DIAGNOSIS — N186 End stage renal disease: Secondary | ICD-10-CM | POA: Diagnosis not present

## 2016-03-18 DIAGNOSIS — Z4931 Encounter for adequacy testing for hemodialysis: Secondary | ICD-10-CM | POA: Diagnosis not present

## 2016-03-18 DIAGNOSIS — D631 Anemia in chronic kidney disease: Secondary | ICD-10-CM | POA: Diagnosis not present

## 2016-03-18 DIAGNOSIS — E875 Hyperkalemia: Secondary | ICD-10-CM | POA: Diagnosis not present

## 2016-03-18 DIAGNOSIS — N2581 Secondary hyperparathyroidism of renal origin: Secondary | ICD-10-CM | POA: Diagnosis not present

## 2016-03-21 DIAGNOSIS — N2581 Secondary hyperparathyroidism of renal origin: Secondary | ICD-10-CM | POA: Diagnosis not present

## 2016-03-21 DIAGNOSIS — N186 End stage renal disease: Secondary | ICD-10-CM | POA: Diagnosis not present

## 2016-03-21 DIAGNOSIS — E875 Hyperkalemia: Secondary | ICD-10-CM | POA: Diagnosis not present

## 2016-03-21 DIAGNOSIS — D631 Anemia in chronic kidney disease: Secondary | ICD-10-CM | POA: Diagnosis not present

## 2016-03-21 DIAGNOSIS — Z4931 Encounter for adequacy testing for hemodialysis: Secondary | ICD-10-CM | POA: Diagnosis not present

## 2016-03-23 DIAGNOSIS — E875 Hyperkalemia: Secondary | ICD-10-CM | POA: Diagnosis not present

## 2016-03-23 DIAGNOSIS — N2581 Secondary hyperparathyroidism of renal origin: Secondary | ICD-10-CM | POA: Diagnosis not present

## 2016-03-23 DIAGNOSIS — Z4931 Encounter for adequacy testing for hemodialysis: Secondary | ICD-10-CM | POA: Diagnosis not present

## 2016-03-23 DIAGNOSIS — N186 End stage renal disease: Secondary | ICD-10-CM | POA: Diagnosis not present

## 2016-03-23 DIAGNOSIS — D631 Anemia in chronic kidney disease: Secondary | ICD-10-CM | POA: Diagnosis not present

## 2016-03-25 DIAGNOSIS — E875 Hyperkalemia: Secondary | ICD-10-CM | POA: Diagnosis not present

## 2016-03-25 DIAGNOSIS — N2581 Secondary hyperparathyroidism of renal origin: Secondary | ICD-10-CM | POA: Diagnosis not present

## 2016-03-25 DIAGNOSIS — N186 End stage renal disease: Secondary | ICD-10-CM | POA: Diagnosis not present

## 2016-03-25 DIAGNOSIS — D631 Anemia in chronic kidney disease: Secondary | ICD-10-CM | POA: Diagnosis not present

## 2016-03-25 DIAGNOSIS — Z4931 Encounter for adequacy testing for hemodialysis: Secondary | ICD-10-CM | POA: Diagnosis not present

## 2016-03-29 DIAGNOSIS — R197 Diarrhea, unspecified: Secondary | ICD-10-CM | POA: Diagnosis not present

## 2016-03-29 DIAGNOSIS — R109 Unspecified abdominal pain: Secondary | ICD-10-CM | POA: Diagnosis not present

## 2016-03-29 DIAGNOSIS — Z886 Allergy status to analgesic agent status: Secondary | ICD-10-CM | POA: Diagnosis not present

## 2016-03-29 DIAGNOSIS — R112 Nausea with vomiting, unspecified: Secondary | ICD-10-CM | POA: Diagnosis not present

## 2016-03-30 DIAGNOSIS — N186 End stage renal disease: Secondary | ICD-10-CM | POA: Diagnosis not present

## 2016-03-30 DIAGNOSIS — Z4931 Encounter for adequacy testing for hemodialysis: Secondary | ICD-10-CM | POA: Diagnosis not present

## 2016-03-30 DIAGNOSIS — N2581 Secondary hyperparathyroidism of renal origin: Secondary | ICD-10-CM | POA: Diagnosis not present

## 2016-03-30 DIAGNOSIS — E875 Hyperkalemia: Secondary | ICD-10-CM | POA: Diagnosis not present

## 2016-03-30 DIAGNOSIS — D631 Anemia in chronic kidney disease: Secondary | ICD-10-CM | POA: Diagnosis not present

## 2016-04-01 DIAGNOSIS — N186 End stage renal disease: Secondary | ICD-10-CM | POA: Diagnosis not present

## 2016-04-01 DIAGNOSIS — N2581 Secondary hyperparathyroidism of renal origin: Secondary | ICD-10-CM | POA: Diagnosis not present

## 2016-04-01 DIAGNOSIS — D631 Anemia in chronic kidney disease: Secondary | ICD-10-CM | POA: Diagnosis not present

## 2016-04-01 DIAGNOSIS — E875 Hyperkalemia: Secondary | ICD-10-CM | POA: Diagnosis not present

## 2016-04-01 DIAGNOSIS — Z4931 Encounter for adequacy testing for hemodialysis: Secondary | ICD-10-CM | POA: Diagnosis not present

## 2016-04-04 DIAGNOSIS — N186 End stage renal disease: Secondary | ICD-10-CM | POA: Diagnosis not present

## 2016-04-04 DIAGNOSIS — Z4931 Encounter for adequacy testing for hemodialysis: Secondary | ICD-10-CM | POA: Diagnosis not present

## 2016-04-04 DIAGNOSIS — N2581 Secondary hyperparathyroidism of renal origin: Secondary | ICD-10-CM | POA: Diagnosis not present

## 2016-04-04 DIAGNOSIS — E875 Hyperkalemia: Secondary | ICD-10-CM | POA: Diagnosis not present

## 2016-04-04 DIAGNOSIS — D631 Anemia in chronic kidney disease: Secondary | ICD-10-CM | POA: Diagnosis not present

## 2016-04-06 DIAGNOSIS — E875 Hyperkalemia: Secondary | ICD-10-CM | POA: Diagnosis not present

## 2016-04-06 DIAGNOSIS — N2581 Secondary hyperparathyroidism of renal origin: Secondary | ICD-10-CM | POA: Diagnosis not present

## 2016-04-06 DIAGNOSIS — N186 End stage renal disease: Secondary | ICD-10-CM | POA: Diagnosis not present

## 2016-04-06 DIAGNOSIS — D631 Anemia in chronic kidney disease: Secondary | ICD-10-CM | POA: Diagnosis not present

## 2016-04-06 DIAGNOSIS — Z4931 Encounter for adequacy testing for hemodialysis: Secondary | ICD-10-CM | POA: Diagnosis not present

## 2016-04-08 DIAGNOSIS — N2581 Secondary hyperparathyroidism of renal origin: Secondary | ICD-10-CM | POA: Diagnosis not present

## 2016-04-08 DIAGNOSIS — E875 Hyperkalemia: Secondary | ICD-10-CM | POA: Diagnosis not present

## 2016-04-08 DIAGNOSIS — D631 Anemia in chronic kidney disease: Secondary | ICD-10-CM | POA: Diagnosis not present

## 2016-04-08 DIAGNOSIS — Z4931 Encounter for adequacy testing for hemodialysis: Secondary | ICD-10-CM | POA: Diagnosis not present

## 2016-04-08 DIAGNOSIS — N186 End stage renal disease: Secondary | ICD-10-CM | POA: Diagnosis not present

## 2016-04-11 DIAGNOSIS — N186 End stage renal disease: Secondary | ICD-10-CM | POA: Diagnosis not present

## 2016-04-11 DIAGNOSIS — E875 Hyperkalemia: Secondary | ICD-10-CM | POA: Diagnosis not present

## 2016-04-11 DIAGNOSIS — D631 Anemia in chronic kidney disease: Secondary | ICD-10-CM | POA: Diagnosis not present

## 2016-04-11 DIAGNOSIS — Z4931 Encounter for adequacy testing for hemodialysis: Secondary | ICD-10-CM | POA: Diagnosis not present

## 2016-04-11 DIAGNOSIS — N2581 Secondary hyperparathyroidism of renal origin: Secondary | ICD-10-CM | POA: Diagnosis not present

## 2016-04-13 DIAGNOSIS — Z4931 Encounter for adequacy testing for hemodialysis: Secondary | ICD-10-CM | POA: Diagnosis not present

## 2016-04-13 DIAGNOSIS — D631 Anemia in chronic kidney disease: Secondary | ICD-10-CM | POA: Diagnosis not present

## 2016-04-13 DIAGNOSIS — E875 Hyperkalemia: Secondary | ICD-10-CM | POA: Diagnosis not present

## 2016-04-13 DIAGNOSIS — N186 End stage renal disease: Secondary | ICD-10-CM | POA: Diagnosis not present

## 2016-04-13 DIAGNOSIS — N2581 Secondary hyperparathyroidism of renal origin: Secondary | ICD-10-CM | POA: Diagnosis not present

## 2016-04-14 DIAGNOSIS — Z992 Dependence on renal dialysis: Secondary | ICD-10-CM | POA: Diagnosis not present

## 2016-04-14 DIAGNOSIS — N186 End stage renal disease: Secondary | ICD-10-CM | POA: Diagnosis not present

## 2016-04-15 DIAGNOSIS — N2581 Secondary hyperparathyroidism of renal origin: Secondary | ICD-10-CM | POA: Diagnosis not present

## 2016-04-15 DIAGNOSIS — I10 Essential (primary) hypertension: Secondary | ICD-10-CM | POA: Diagnosis not present

## 2016-04-15 DIAGNOSIS — D696 Thrombocytopenia, unspecified: Secondary | ICD-10-CM | POA: Diagnosis not present

## 2016-04-15 DIAGNOSIS — N186 End stage renal disease: Secondary | ICD-10-CM | POA: Diagnosis not present

## 2016-04-15 DIAGNOSIS — D631 Anemia in chronic kidney disease: Secondary | ICD-10-CM | POA: Diagnosis not present

## 2016-04-15 DIAGNOSIS — D509 Iron deficiency anemia, unspecified: Secondary | ICD-10-CM | POA: Diagnosis not present

## 2016-04-20 DIAGNOSIS — D631 Anemia in chronic kidney disease: Secondary | ICD-10-CM | POA: Diagnosis not present

## 2016-04-20 DIAGNOSIS — D696 Thrombocytopenia, unspecified: Secondary | ICD-10-CM | POA: Diagnosis not present

## 2016-04-20 DIAGNOSIS — N2581 Secondary hyperparathyroidism of renal origin: Secondary | ICD-10-CM | POA: Diagnosis not present

## 2016-04-20 DIAGNOSIS — N186 End stage renal disease: Secondary | ICD-10-CM | POA: Diagnosis not present

## 2016-04-20 DIAGNOSIS — D509 Iron deficiency anemia, unspecified: Secondary | ICD-10-CM | POA: Diagnosis not present

## 2016-04-24 DIAGNOSIS — D696 Thrombocytopenia, unspecified: Secondary | ICD-10-CM | POA: Diagnosis not present

## 2016-04-24 DIAGNOSIS — D509 Iron deficiency anemia, unspecified: Secondary | ICD-10-CM | POA: Diagnosis not present

## 2016-04-24 DIAGNOSIS — D631 Anemia in chronic kidney disease: Secondary | ICD-10-CM | POA: Diagnosis not present

## 2016-04-24 DIAGNOSIS — N2581 Secondary hyperparathyroidism of renal origin: Secondary | ICD-10-CM | POA: Diagnosis not present

## 2016-04-24 DIAGNOSIS — N186 End stage renal disease: Secondary | ICD-10-CM | POA: Diagnosis not present

## 2016-04-25 DIAGNOSIS — Z992 Dependence on renal dialysis: Secondary | ICD-10-CM | POA: Diagnosis not present

## 2016-04-25 DIAGNOSIS — Z113 Encounter for screening for infections with a predominantly sexual mode of transmission: Secondary | ICD-10-CM | POA: Diagnosis not present

## 2016-04-25 DIAGNOSIS — Z719 Counseling, unspecified: Secondary | ICD-10-CM | POA: Diagnosis not present

## 2016-04-25 DIAGNOSIS — Z298 Encounter for other specified prophylactic measures: Secondary | ICD-10-CM | POA: Diagnosis not present

## 2016-04-25 DIAGNOSIS — Z21 Asymptomatic human immunodeficiency virus [HIV] infection status: Secondary | ICD-10-CM | POA: Diagnosis not present

## 2016-04-25 DIAGNOSIS — Z79899 Other long term (current) drug therapy: Secondary | ICD-10-CM | POA: Diagnosis not present

## 2016-04-25 DIAGNOSIS — N186 End stage renal disease: Secondary | ICD-10-CM | POA: Diagnosis not present

## 2016-04-27 DIAGNOSIS — D696 Thrombocytopenia, unspecified: Secondary | ICD-10-CM | POA: Diagnosis not present

## 2016-04-27 DIAGNOSIS — D509 Iron deficiency anemia, unspecified: Secondary | ICD-10-CM | POA: Diagnosis not present

## 2016-04-27 DIAGNOSIS — N186 End stage renal disease: Secondary | ICD-10-CM | POA: Diagnosis not present

## 2016-04-27 DIAGNOSIS — N2581 Secondary hyperparathyroidism of renal origin: Secondary | ICD-10-CM | POA: Diagnosis not present

## 2016-04-27 DIAGNOSIS — D631 Anemia in chronic kidney disease: Secondary | ICD-10-CM | POA: Diagnosis not present

## 2016-04-29 DIAGNOSIS — D509 Iron deficiency anemia, unspecified: Secondary | ICD-10-CM | POA: Diagnosis not present

## 2016-04-29 DIAGNOSIS — N2581 Secondary hyperparathyroidism of renal origin: Secondary | ICD-10-CM | POA: Diagnosis not present

## 2016-04-29 DIAGNOSIS — D696 Thrombocytopenia, unspecified: Secondary | ICD-10-CM | POA: Diagnosis not present

## 2016-04-29 DIAGNOSIS — N186 End stage renal disease: Secondary | ICD-10-CM | POA: Diagnosis not present

## 2016-04-29 DIAGNOSIS — D631 Anemia in chronic kidney disease: Secondary | ICD-10-CM | POA: Diagnosis not present

## 2016-05-02 DIAGNOSIS — D631 Anemia in chronic kidney disease: Secondary | ICD-10-CM | POA: Diagnosis not present

## 2016-05-02 DIAGNOSIS — D509 Iron deficiency anemia, unspecified: Secondary | ICD-10-CM | POA: Diagnosis not present

## 2016-05-02 DIAGNOSIS — N186 End stage renal disease: Secondary | ICD-10-CM | POA: Diagnosis not present

## 2016-05-02 DIAGNOSIS — N2581 Secondary hyperparathyroidism of renal origin: Secondary | ICD-10-CM | POA: Diagnosis not present

## 2016-05-02 DIAGNOSIS — D696 Thrombocytopenia, unspecified: Secondary | ICD-10-CM | POA: Diagnosis not present

## 2016-05-04 DIAGNOSIS — N2581 Secondary hyperparathyroidism of renal origin: Secondary | ICD-10-CM | POA: Diagnosis not present

## 2016-05-04 DIAGNOSIS — D696 Thrombocytopenia, unspecified: Secondary | ICD-10-CM | POA: Diagnosis not present

## 2016-05-04 DIAGNOSIS — D509 Iron deficiency anemia, unspecified: Secondary | ICD-10-CM | POA: Diagnosis not present

## 2016-05-04 DIAGNOSIS — N186 End stage renal disease: Secondary | ICD-10-CM | POA: Diagnosis not present

## 2016-05-04 DIAGNOSIS — D631 Anemia in chronic kidney disease: Secondary | ICD-10-CM | POA: Diagnosis not present

## 2016-05-06 DIAGNOSIS — D696 Thrombocytopenia, unspecified: Secondary | ICD-10-CM | POA: Diagnosis not present

## 2016-05-06 DIAGNOSIS — N2581 Secondary hyperparathyroidism of renal origin: Secondary | ICD-10-CM | POA: Diagnosis not present

## 2016-05-06 DIAGNOSIS — N186 End stage renal disease: Secondary | ICD-10-CM | POA: Diagnosis not present

## 2016-05-06 DIAGNOSIS — D509 Iron deficiency anemia, unspecified: Secondary | ICD-10-CM | POA: Diagnosis not present

## 2016-05-06 DIAGNOSIS — D631 Anemia in chronic kidney disease: Secondary | ICD-10-CM | POA: Diagnosis not present

## 2016-05-08 DIAGNOSIS — N186 End stage renal disease: Secondary | ICD-10-CM | POA: Diagnosis not present

## 2016-05-08 DIAGNOSIS — D509 Iron deficiency anemia, unspecified: Secondary | ICD-10-CM | POA: Diagnosis not present

## 2016-05-08 DIAGNOSIS — D696 Thrombocytopenia, unspecified: Secondary | ICD-10-CM | POA: Diagnosis not present

## 2016-05-08 DIAGNOSIS — D631 Anemia in chronic kidney disease: Secondary | ICD-10-CM | POA: Diagnosis not present

## 2016-05-08 DIAGNOSIS — N2581 Secondary hyperparathyroidism of renal origin: Secondary | ICD-10-CM | POA: Diagnosis not present

## 2016-05-09 DIAGNOSIS — N186 End stage renal disease: Secondary | ICD-10-CM | POA: Diagnosis not present

## 2016-05-09 DIAGNOSIS — D509 Iron deficiency anemia, unspecified: Secondary | ICD-10-CM | POA: Diagnosis not present

## 2016-05-09 DIAGNOSIS — N2581 Secondary hyperparathyroidism of renal origin: Secondary | ICD-10-CM | POA: Diagnosis not present

## 2016-05-09 DIAGNOSIS — D631 Anemia in chronic kidney disease: Secondary | ICD-10-CM | POA: Diagnosis not present

## 2016-05-09 DIAGNOSIS — D696 Thrombocytopenia, unspecified: Secondary | ICD-10-CM | POA: Diagnosis not present

## 2016-05-11 DIAGNOSIS — D696 Thrombocytopenia, unspecified: Secondary | ICD-10-CM | POA: Diagnosis not present

## 2016-05-11 DIAGNOSIS — N2581 Secondary hyperparathyroidism of renal origin: Secondary | ICD-10-CM | POA: Diagnosis not present

## 2016-05-11 DIAGNOSIS — D509 Iron deficiency anemia, unspecified: Secondary | ICD-10-CM | POA: Diagnosis not present

## 2016-05-11 DIAGNOSIS — D631 Anemia in chronic kidney disease: Secondary | ICD-10-CM | POA: Diagnosis not present

## 2016-05-11 DIAGNOSIS — N186 End stage renal disease: Secondary | ICD-10-CM | POA: Diagnosis not present

## 2016-05-15 DIAGNOSIS — Z992 Dependence on renal dialysis: Secondary | ICD-10-CM | POA: Diagnosis not present

## 2016-05-15 DIAGNOSIS — N186 End stage renal disease: Secondary | ICD-10-CM | POA: Diagnosis not present

## 2016-05-16 DIAGNOSIS — D696 Thrombocytopenia, unspecified: Secondary | ICD-10-CM | POA: Diagnosis not present

## 2016-05-16 DIAGNOSIS — D509 Iron deficiency anemia, unspecified: Secondary | ICD-10-CM | POA: Diagnosis not present

## 2016-05-16 DIAGNOSIS — I10 Essential (primary) hypertension: Secondary | ICD-10-CM | POA: Diagnosis not present

## 2016-05-16 DIAGNOSIS — D631 Anemia in chronic kidney disease: Secondary | ICD-10-CM | POA: Diagnosis not present

## 2016-05-16 DIAGNOSIS — N2581 Secondary hyperparathyroidism of renal origin: Secondary | ICD-10-CM | POA: Diagnosis not present

## 2016-05-16 DIAGNOSIS — N186 End stage renal disease: Secondary | ICD-10-CM | POA: Diagnosis not present

## 2016-05-18 DIAGNOSIS — N2581 Secondary hyperparathyroidism of renal origin: Secondary | ICD-10-CM | POA: Diagnosis not present

## 2016-05-18 DIAGNOSIS — D696 Thrombocytopenia, unspecified: Secondary | ICD-10-CM | POA: Diagnosis not present

## 2016-05-18 DIAGNOSIS — D631 Anemia in chronic kidney disease: Secondary | ICD-10-CM | POA: Diagnosis not present

## 2016-05-18 DIAGNOSIS — D509 Iron deficiency anemia, unspecified: Secondary | ICD-10-CM | POA: Diagnosis not present

## 2016-05-18 DIAGNOSIS — N186 End stage renal disease: Secondary | ICD-10-CM | POA: Diagnosis not present

## 2016-05-20 DIAGNOSIS — D696 Thrombocytopenia, unspecified: Secondary | ICD-10-CM | POA: Diagnosis not present

## 2016-05-20 DIAGNOSIS — D509 Iron deficiency anemia, unspecified: Secondary | ICD-10-CM | POA: Diagnosis not present

## 2016-05-20 DIAGNOSIS — N2581 Secondary hyperparathyroidism of renal origin: Secondary | ICD-10-CM | POA: Diagnosis not present

## 2016-05-20 DIAGNOSIS — N186 End stage renal disease: Secondary | ICD-10-CM | POA: Diagnosis not present

## 2016-05-20 DIAGNOSIS — D631 Anemia in chronic kidney disease: Secondary | ICD-10-CM | POA: Diagnosis not present

## 2016-05-23 DIAGNOSIS — D631 Anemia in chronic kidney disease: Secondary | ICD-10-CM | POA: Diagnosis not present

## 2016-05-23 DIAGNOSIS — D509 Iron deficiency anemia, unspecified: Secondary | ICD-10-CM | POA: Diagnosis not present

## 2016-05-23 DIAGNOSIS — N2581 Secondary hyperparathyroidism of renal origin: Secondary | ICD-10-CM | POA: Diagnosis not present

## 2016-05-23 DIAGNOSIS — N186 End stage renal disease: Secondary | ICD-10-CM | POA: Diagnosis not present

## 2016-05-23 DIAGNOSIS — D696 Thrombocytopenia, unspecified: Secondary | ICD-10-CM | POA: Diagnosis not present

## 2016-05-25 DIAGNOSIS — D631 Anemia in chronic kidney disease: Secondary | ICD-10-CM | POA: Diagnosis not present

## 2016-05-25 DIAGNOSIS — D509 Iron deficiency anemia, unspecified: Secondary | ICD-10-CM | POA: Diagnosis not present

## 2016-05-25 DIAGNOSIS — N186 End stage renal disease: Secondary | ICD-10-CM | POA: Diagnosis not present

## 2016-05-25 DIAGNOSIS — N2581 Secondary hyperparathyroidism of renal origin: Secondary | ICD-10-CM | POA: Diagnosis not present

## 2016-05-25 DIAGNOSIS — D696 Thrombocytopenia, unspecified: Secondary | ICD-10-CM | POA: Diagnosis not present

## 2016-05-27 DIAGNOSIS — N2581 Secondary hyperparathyroidism of renal origin: Secondary | ICD-10-CM | POA: Diagnosis not present

## 2016-05-27 DIAGNOSIS — D696 Thrombocytopenia, unspecified: Secondary | ICD-10-CM | POA: Diagnosis not present

## 2016-05-27 DIAGNOSIS — N186 End stage renal disease: Secondary | ICD-10-CM | POA: Diagnosis not present

## 2016-05-27 DIAGNOSIS — D509 Iron deficiency anemia, unspecified: Secondary | ICD-10-CM | POA: Diagnosis not present

## 2016-05-27 DIAGNOSIS — D631 Anemia in chronic kidney disease: Secondary | ICD-10-CM | POA: Diagnosis not present

## 2016-05-30 ENCOUNTER — Encounter (HOSPITAL_COMMUNITY): Payer: Self-pay | Admitting: *Deleted

## 2016-05-30 ENCOUNTER — Emergency Department (HOSPITAL_COMMUNITY)
Admission: EM | Admit: 2016-05-30 | Discharge: 2016-05-30 | Disposition: A | Discharge status: Home / Self Care | Payer: Medicare Other | Attending: Emergency Medicine | Admitting: Emergency Medicine

## 2016-05-30 DIAGNOSIS — K5900 Constipation, unspecified: Secondary | ICD-10-CM

## 2016-05-30 DIAGNOSIS — N186 End stage renal disease: Secondary | ICD-10-CM | POA: Insufficient documentation

## 2016-05-30 DIAGNOSIS — N2581 Secondary hyperparathyroidism of renal origin: Secondary | ICD-10-CM | POA: Diagnosis not present

## 2016-05-30 DIAGNOSIS — F17210 Nicotine dependence, cigarettes, uncomplicated: Secondary | ICD-10-CM | POA: Diagnosis not present

## 2016-05-30 DIAGNOSIS — K649 Unspecified hemorrhoids: Secondary | ICD-10-CM | POA: Insufficient documentation

## 2016-05-30 DIAGNOSIS — J45909 Unspecified asthma, uncomplicated: Secondary | ICD-10-CM | POA: Insufficient documentation

## 2016-05-30 DIAGNOSIS — D631 Anemia in chronic kidney disease: Secondary | ICD-10-CM | POA: Diagnosis not present

## 2016-05-30 DIAGNOSIS — I5023 Acute on chronic systolic (congestive) heart failure: Secondary | ICD-10-CM | POA: Insufficient documentation

## 2016-05-30 DIAGNOSIS — D696 Thrombocytopenia, unspecified: Secondary | ICD-10-CM | POA: Diagnosis not present

## 2016-05-30 DIAGNOSIS — Z79899 Other long term (current) drug therapy: Secondary | ICD-10-CM | POA: Diagnosis not present

## 2016-05-30 DIAGNOSIS — D509 Iron deficiency anemia, unspecified: Secondary | ICD-10-CM | POA: Diagnosis not present

## 2016-05-30 DIAGNOSIS — Z992 Dependence on renal dialysis: Secondary | ICD-10-CM | POA: Diagnosis not present

## 2016-05-30 MED ORDER — DOCUSATE SODIUM 100 MG PO CAPS: 100.0000 mg | ORAL_CAPSULE | Freq: Two times a day (BID) | ORAL | 0 refills | Status: DC

## 2016-05-30 MED ORDER — HYDROCORTISONE ACETATE 25 MG RE SUPP: 25.0000 mg | Freq: Two times a day (BID) | RECTAL | 0 refills | Status: DC

## 2016-05-30 NOTE — ED Triage Notes (Signed)
Pt comes in for constipation. He is unsure of the last time that he made a BM, he states it was the end of last week. Pt states he has a hemorrhoid and that he feels like it is obstructing him from making a BM.

## 2016-05-30 NOTE — ED Provider Notes (Signed)
[facility]-EMERGENCY DEPT Provider Note   CSN: [id number] Arrival date & time: 05/30/16  1832  By signing my name below, I, Vista Mink, attest that this documentation has been prepared under the direction and in the presence of Jacalyn Lefevre, MD. Electronically signed, Vista Mink, ED Scribe. 05/30/16. 10:02 PM.   History   Chief Complaint Chief Complaint  Patient presents with  . Constipation    HPI HPI Comments: Dave Estrada is a 25 y.o. Male with a PMHx of HIV, ESRD, who presents to the Emergency Department complaining of persistent constipation for approximately five days. Pt is unsure of the last time he had a BM but reports it was at the end of last week. Pt also reports that he has a hemorrhoid and states that he feels like it is obstructing him from having a BM. Pt receives dialysis on Tuesdays, Thursdays and Saturdays; states he has not missed a treatment.   The history is provided by the patient. No language interpreter was used.  Constipation     Past Medical History:  Diagnosis Date  . A-V fistula (HCC)    Placed on 10/27/2014 at [facility]; for future HD use.   . Asthma   . ESRD (end stage renal disease) (HCC)   . HIV (human immunodeficiency virus infection) (HCC)   . Staphylococcus aureus bacteremia with sepsis (HCC) 09/09/2014  . Systolic dysfunction, left ventricle 12/20/2014   EF 20%     Patient Active Problem List   Diagnosis Date Noted  . Chronic systolic heart failure (HCC) 01/13/2016  . Nonischemic cardiomyopathy (HCC) 11/27/2015  . Hemoptysis 09/11/2015  . Cough 09/11/2015  . SOB (shortness of breath) 09/11/2015  . Acute respiratory failure (HCC) 09/11/2015  . Elevated troponin 09/11/2015  . Protein-calorie malnutrition, severe (HCC) 09/11/2015  . Pneumonia 09/11/2015  . Community acquired pneumonia   . Acute on chronic systolic CHF (congestive heart failure) (HCC) 12/20/2014  . A-V fistula (HCC) 12/18/2014  . Thrombocytopenia, acquired 12/18/2014  .  Other infectious complication of vascular dialysis catheter 12/17/2014  . 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 Pyrexia   . Vomiting and diarrhea   . AIDS (HCC)   . Macrocytic anemia 09/10/2014  . Cigarette smoker 09/10/2014  . Liver lesion 09/10/2014  . Asthma, chronic 09/10/2014  . History of MRSA infection 09/10/2014  . MRSA bacteremia 09/10/2014  . Staphylococcus aureus bacteremia with sepsis (HCC) 09/09/2014  . HIV (human immunodeficiency virus infection) (HCC) 09/08/2014  . ESRD on hemodialysis (HCC) 09/08/2014    Past Surgical History:  Procedure Laterality Date  . INSERTION OF DIALYSIS CATHETER Left 09/15/2014   Procedure: INSERTION OF DIALYSIS CATHETER;  Surgeon: Nada Libman, MD;  Location: [facility] OR;  Service: Vascular;  Laterality: Left;  . PORT A CATH REVISION    . TEE WITHOUT CARDIOVERSION N/A 09/14/2014   Procedure: TRANSESOPHAGEAL ECHOCARDIOGRAM (TEE);  Surgeon: Laurey Morale, MD;  Location: [facility] ENDOSCOPY;  Service: Cardiovascular;  Laterality: N/A;  will be at [facility] by mon       Home Medications    Prior to Admission medications   Medication Sig Start Date End Date Taking? Authorizing Provider  albuterol (PROVENTIL HFA;VENTOLIN HFA) 108 (90 BASE) MCG/ACT inhaler Inhale 1-2 puffs into the lungs every 6 (six) hours as needed for wheezing or shortness of breath.    Historical Provider, MD  docusate sodium (COLACE) 100 MG capsule Take 1 capsule (100 mg total) by mouth every 12 (twelve) hours. 05/30/16  Jacalyn LefevreJulie Roark Rufo, MD  emtricitabine (EMTRIVA) 200 MG capsule Take 200 mg by mouth 2 (two) times a week. Taken after dialysis on Tuesday and Thursday    Historical Provider, MD  etravirine (INTELENCE) 100 MG tablet Take 200 mg by mouth every 12 (twelve) hours.    Historical Provider, MD  hydrocortisone (ANUSOL-HC) 25 MG suppository Place 1 suppository (25 mg total) rectally 2 (two) times daily. 05/30/16   Jacalyn LefevreJulie Catricia Scheerer, MD  ibuprofen  (ADVIL,MOTRIN) 200 MG tablet Take 200 mg by mouth every 6 (six) hours as needed for fever or moderate pain.    Historical Provider, MD  lisinopril (PRINIVIL,ZESTRIL) 2.5 MG tablet Take 1 tablet (2.5 mg total) by mouth daily. 11/19/15   Laqueta LindenSuresh A Koneswaran, MD  metoprolol succinate (TOPROL-XL) 50 MG 24 hr tablet Take one and one half tab twice a day Take with or immediately following a meal. 01/12/16   Dolores Pattyaniel R Bensimhon, MD  raltegravir (ISENTRESS) 400 MG tablet Take 400 mg by mouth 2 (two) times daily.    Historical Provider, MD  sulfamethoxazole-trimethoprim (BACTRIM,SEPTRA) 400-80 MG tablet Take 1 tablet by mouth 3 (three) times a week. To take after dialysis days    Historical Provider, MD  tenofovir (VIREAD) 300 MG tablet Take 300 mg by mouth once a week. To be taken on Tuesday after dialysis    Historical Provider, MD  vancomycin 500 mg in sodium chloride 0.9 % 100 mL Inject 500 mg into the vein Every Tuesday,Thursday,and Saturday with dialysis. Last dose 3/22. 12/03/15   Standley Brookinganiel P Goodrich, MD    Family History Family History  Problem Relation Age of Onset  . Seizures Father     Social History Social History  Substance Use Topics  . Smoking status: Current Every Day Smoker    Packs/day: 1.00    Years: 11.00    Types: Cigarettes    Start date: 01/28/2003  . Smokeless tobacco: Never Used  . Alcohol use No     Allergies   Bee venom; Other; and Aspirin   Review of Systems Review of Systems  Constitutional: Negative for fever.  Gastrointestinal: Positive for constipation.  All other systems reviewed and are negative.    Physical Exam Updated Vital Signs BP 118/81 (BP Location: Left Arm)   Pulse 120   Temp 98.8 F (37.1 C) (Oral)   Resp 14   Ht 5\' 5"  (1.651 m)   Wt 135 lb (61.2 kg)   SpO2 99%   BMI 22.47 kg/m   Physical Exam  Constitutional: He is oriented to person, place, and time. He appears well-developed and well-nourished. No distress.  HENT:  Head:  Normocephalic and atraumatic.  Neck: Normal range of motion.  Cardiovascular: Regular rhythm.   Good thrill in left upper arm  Pulmonary/Chest: Effort normal.  Abdominal:  Decreased bowel sounds  Genitourinary: Rectal exam shows internal hemorrhoid.     Neurological: He is alert and oriented to person, place, and time.  Skin: Skin is warm and dry. He is not diaphoretic.  Psychiatric: He has a normal mood and affect. Judgment normal.  Nursing note and vitals reviewed.    ED Treatments / Results  DIAGNOSTIC STUDIES: Oxygen Saturation is 99% on RA, normal by my interpretation.  COORDINATION OF CARE: 9:36 PM-Discussed treatment plan with pt at bedside and pt agreed to plan.   Labs (all labs ordered are listed, but only abnormal results are displayed) Labs Reviewed - No data to display  EKG  EKG Interpretation None  Radiology No results found.  Procedures Procedures (including critical care time)  Medications Ordered in ED Medications - No data to display   Initial Impression / Assessment and Plan / ED Course  I have reviewed the triage vital signs and the nursing notes.  Pertinent labs & imaging results that were available during my care of the patient were reviewed by me and considered in my medical decision making (see chart for details).  Clinical Course   Pt describes something coming out of bottom that is not stool when he strains, so I suspect an internal hemorrhoid.  Pt will be started on anusol and colace.  He knows to return if worse and to f/u with pcp.  Final Clinical Impressions(s) / ED Diagnoses   Final diagnoses:  Hemorrhoids, unspecified hemorrhoid type  Constipation, unspecified constipation type    New Prescriptions New Prescriptions   DOCUSATE SODIUM (COLACE) 100 MG CAPSULE    Take 1 capsule (100 mg total) by mouth every 12 (twelve) hours.   HYDROCORTISONE (ANUSOL-HC) 25 MG SUPPOSITORY    Place 1 suppository (25 mg total) rectally 2  (two) times daily.  I personally performed the services described in this documentation, which was scribed in my presence. The recorded information has been reviewed and is accurate.    Jacalyn Lefevre, MD 05/30/16 2209

## 2016-06-01 DIAGNOSIS — D509 Iron deficiency anemia, unspecified: Secondary | ICD-10-CM | POA: Diagnosis not present

## 2016-06-01 DIAGNOSIS — D696 Thrombocytopenia, unspecified: Secondary | ICD-10-CM | POA: Diagnosis not present

## 2016-06-01 DIAGNOSIS — D631 Anemia in chronic kidney disease: Secondary | ICD-10-CM | POA: Diagnosis not present

## 2016-06-01 DIAGNOSIS — N2581 Secondary hyperparathyroidism of renal origin: Secondary | ICD-10-CM | POA: Diagnosis not present

## 2016-06-01 DIAGNOSIS — N186 End stage renal disease: Secondary | ICD-10-CM | POA: Diagnosis not present

## 2016-06-06 DIAGNOSIS — N2581 Secondary hyperparathyroidism of renal origin: Secondary | ICD-10-CM | POA: Diagnosis not present

## 2016-06-06 DIAGNOSIS — N186 End stage renal disease: Secondary | ICD-10-CM | POA: Diagnosis not present

## 2016-06-06 DIAGNOSIS — D509 Iron deficiency anemia, unspecified: Secondary | ICD-10-CM | POA: Diagnosis not present

## 2016-06-06 DIAGNOSIS — D696 Thrombocytopenia, unspecified: Secondary | ICD-10-CM | POA: Diagnosis not present

## 2016-06-06 DIAGNOSIS — D631 Anemia in chronic kidney disease: Secondary | ICD-10-CM | POA: Diagnosis not present

## 2016-06-10 ENCOUNTER — Encounter (HOSPITAL_COMMUNITY): Payer: Self-pay | Admitting: *Deleted

## 2016-06-10 ENCOUNTER — Observation Stay (HOSPITAL_COMMUNITY)
Admission: EM | Admit: 2016-06-10 | Discharge: 2016-06-12 | Disposition: A | Discharge status: Home / Self Care | Payer: Medicare Other | Attending: Internal Medicine | Admitting: Internal Medicine

## 2016-06-10 ENCOUNTER — Emergency Department (HOSPITAL_COMMUNITY): Payer: Medicare Other

## 2016-06-10 DIAGNOSIS — Z992 Dependence on renal dialysis: Secondary | ICD-10-CM | POA: Insufficient documentation

## 2016-06-10 DIAGNOSIS — F17210 Nicotine dependence, cigarettes, uncomplicated: Secondary | ICD-10-CM | POA: Diagnosis not present

## 2016-06-10 DIAGNOSIS — R0600 Dyspnea, unspecified: Secondary | ICD-10-CM | POA: Diagnosis present

## 2016-06-10 DIAGNOSIS — N186 End stage renal disease: Secondary | ICD-10-CM | POA: Diagnosis not present

## 2016-06-10 DIAGNOSIS — B20 Human immunodeficiency virus [HIV] disease: Secondary | ICD-10-CM | POA: Diagnosis not present

## 2016-06-10 DIAGNOSIS — Z79899 Other long term (current) drug therapy: Secondary | ICD-10-CM | POA: Diagnosis not present

## 2016-06-10 DIAGNOSIS — J9801 Acute bronchospasm: Secondary | ICD-10-CM | POA: Insufficient documentation

## 2016-06-10 DIAGNOSIS — Z8614 Personal history of Methicillin resistant Staphylococcus aureus infection: Secondary | ICD-10-CM | POA: Diagnosis present

## 2016-06-10 DIAGNOSIS — Z21 Asymptomatic human immunodeficiency virus [HIV] infection status: Secondary | ICD-10-CM | POA: Diagnosis present

## 2016-06-10 DIAGNOSIS — R002 Palpitations: Secondary | ICD-10-CM | POA: Diagnosis not present

## 2016-06-10 DIAGNOSIS — R0602 Shortness of breath: Secondary | ICD-10-CM | POA: Diagnosis present

## 2016-06-10 HISTORY — DX: Acute myocardial infarction, unspecified: I21.9

## 2016-06-10 LAB — BASIC METABOLIC PANEL
Anion gap: 13 (ref 5–15)
BUN: 65 mg/dL — AB (ref 6–20)
CALCIUM: 8.8 mg/dL — AB (ref 8.9–10.3)
CO2: 22 mmol/L (ref 22–32)
CREATININE: 12.54 mg/dL — AB (ref 0.61–1.24)
Chloride: 101 mmol/L (ref 101–111)
GFR calc non Af Amer: 5 mL/min — ABNORMAL LOW (ref >60)
GFR, EST AFRICAN AMERICAN: 6 mL/min — AB (ref >60)
GLUCOSE: 92 mg/dL (ref 65–99)
Potassium: 4.5 mmol/L (ref 3.5–5.1)
Sodium: 136 mmol/L (ref 135–145)

## 2016-06-10 LAB — CBC WITH DIFFERENTIAL/PLATELET
BASOS PCT: 1 %
Basophils Absolute: 0.1 10*3/uL (ref 0.0–0.1)
EOS ABS: 0.2 10*3/uL (ref 0.0–0.7)
EOS PCT: 3 %
HCT: 35.6 % — ABNORMAL LOW (ref 39.0–52.0)
Hemoglobin: 12.9 g/dL — ABNORMAL LOW (ref 13.0–17.0)
Lymphocytes Relative: 21 %
Lymphs Abs: 1.2 10*3/uL (ref 0.7–4.0)
MCH: 38.9 pg — ABNORMAL HIGH (ref 26.0–34.0)
MCHC: 36.2 g/dL — AB (ref 30.0–36.0)
MCV: 107.2 fL — ABNORMAL HIGH (ref 78.0–100.0)
MONO ABS: 0.5 10*3/uL (ref 0.1–1.0)
MONOS PCT: 9 %
Neutro Abs: 3.8 10*3/uL (ref 1.7–7.7)
Neutrophils Relative %: 66 %
Platelets: 216 10*3/uL (ref 150–400)
RBC: 3.32 MIL/uL — ABNORMAL LOW (ref 4.22–5.81)
RDW: 16.9 % — AB (ref 11.5–15.5)
WBC: 5.7 10*3/uL (ref 4.0–10.5)

## 2016-06-10 MED ORDER — ALBUTEROL SULFATE (2.5 MG/3ML) 0.083% IN NEBU
2.5000 mg | INHALATION_SOLUTION | Freq: Once | RESPIRATORY_TRACT | Status: AC
Start: 2016-06-11 — End: 2016-06-11
  Administered 2016-06-11: 2.5 mg via RESPIRATORY_TRACT
  Filled 2016-06-10: qty 3

## 2016-06-10 MED ORDER — ALBUTEROL SULFATE (2.5 MG/3ML) 0.083% IN NEBU: 5.0000 mg | INHALATION_SOLUTION | Freq: Once | RESPIRATORY_TRACT | Status: DC

## 2016-06-10 MED ORDER — IPRATROPIUM BROMIDE 0.02 % IN SOLN: 0.5000 mg | Freq: Once | RESPIRATORY_TRACT | Status: DC

## 2016-06-10 MED ORDER — IPRATROPIUM-ALBUTEROL 0.5-2.5 (3) MG/3ML IN SOLN
3.0000 mL | Freq: Once | RESPIRATORY_TRACT | Status: AC
  Administered 2016-06-11: 3 mL via RESPIRATORY_TRACT
  Filled 2016-06-10: qty 3

## 2016-06-10 NOTE — ED Provider Notes (Signed)
[facility]-EMERGENCY DEPT Provider Note   CSN: [id number] Arrival date & time: 06/10/16  2040  By signing my name below, I, Modena Jansky, attest that this documentation has been prepared under the direction and in the presence of Devoria Albe, MD . Electronically Signed: Modena Jansky, Scribe. 06/10/2016. 11:38 PM.  Time Seen 23:30 PM  History   Chief Complaint Chief Complaint  Patient presents with  . Palpitations   The history is provided by the patient.   HPI Comments: Dave Estrada is a 25 y.o. male with a hx of MI in 2014 due to CHF and asthma who presents to the Emergency Department complaining of intermittent moderate SOB that started earlier today. He states that has also been having intermittent heart fluttering over the past 6 months, with episodes lasting a few seconds, and more frequent episodes today.He reports constant moderate substernal chest pain, which he describes as burning, and radiating to his throat like heartburn he has had before. He states that he is currently on dialysis since 2014, going on Tuesdays, Thursdays, and Saturdays. He reports that he skipped his session today (Saturday) due to transportation issues. He reports having leg sweling that started yesterday. He states he smokes 0.5 PPD. He denies any breathing treatment PTA, hx of alcohol use, or PCP. Dialysis is at [facility] in [place].  PCP none Nephrologist ? [place], [place]  Past Medical History:  Diagnosis Date  . A-V fistula (HCC)    Placed on 10/27/2014 at [facility]; for future HD use.   . Asthma   . ESRD (end stage renal disease) (HCC)   . Heart attack (HCC)   . HIV (human immunodeficiency virus infection) (HCC)   . Staphylococcus aureus bacteremia with sepsis (HCC) 09/09/2014  . Systolic dysfunction, left ventricle 12/20/2014   EF 20%     Patient Active Problem List   Diagnosis Date Noted  . ESRD needing dialysis (HCC) 06/11/2016  . Chronic systolic heart failure (HCC) 01/13/2016  .  Nonischemic cardiomyopathy (HCC) 11/27/2015  . Hemoptysis 09/11/2015  . Cough 09/11/2015  . SOB (shortness of breath) 09/11/2015  . Acute respiratory failure (HCC) 09/11/2015  . Elevated troponin 09/11/2015  . Protein-calorie malnutrition, severe (HCC) 09/11/2015  . Pneumonia 09/11/2015  . Community acquired pneumonia   . Acute on chronic systolic CHF (congestive heart failure) (HCC) 12/20/2014  . A-V fistula (HCC) 12/18/2014  . Thrombocytopenia, acquired 12/18/2014  . Other infectious complication of vascular dialysis catheter 12/17/2014  . 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 Pyrexia   . Vomiting and diarrhea   . AIDS (HCC)   . Macrocytic anemia 09/10/2014  . Cigarette smoker 09/10/2014  . Liver lesion 09/10/2014  . Asthma, chronic 09/10/2014  . History of MRSA infection 09/10/2014  . MRSA bacteremia 09/10/2014  . Staphylococcus aureus bacteremia with sepsis (HCC) 09/09/2014  . HIV (human immunodeficiency virus infection) (HCC) 09/08/2014  . ESRD on hemodialysis (HCC) 09/08/2014    Past Surgical History:  Procedure Laterality Date  . INSERTION OF DIALYSIS CATHETER Left 09/15/2014   Procedure: INSERTION OF DIALYSIS CATHETER;  Surgeon: Nada Libman, MD;  Location: [facility] OR;  Service: Vascular;  Laterality: Left;  . PORT A CATH REVISION    . TEE WITHOUT CARDIOVERSION N/A 09/14/2014   Procedure: TRANSESOPHAGEAL ECHOCARDIOGRAM (TEE);  Surgeon: Laurey Morale, MD;  Location: [facility] ENDOSCOPY;  Service: Cardiovascular;  Laterality: N/A;  will be at [facility] by mon       Home Medications  Prior to Admission medications   Medication Sig Start Date End Date Taking? Authorizing Provider  albuterol (PROVENTIL HFA;VENTOLIN HFA) 108 (90 BASE) MCG/ACT inhaler Inhale 1-2 puffs into the lungs every 6 (six) hours as needed for wheezing or shortness of breath.    Historical Provider, MD  docusate sodium (COLACE) 100 MG capsule Take 1 capsule (100 mg total) by mouth  every 12 (twelve) hours. 05/30/16   Jacalyn LefevreJulie Haviland, MD  emtricitabine (EMTRIVA) 200 MG capsule Take 200 mg by mouth 2 (two) times a week. Taken after dialysis on Tuesday and Thursday    Historical Provider, MD  etravirine (INTELENCE) 100 MG tablet Take 200 mg by mouth every 12 (twelve) hours.    Historical Provider, MD  hydrocortisone (ANUSOL-HC) 25 MG suppository Place 1 suppository (25 mg total) rectally 2 (two) times daily. 05/30/16   Jacalyn LefevreJulie Haviland, MD  ibuprofen (ADVIL,MOTRIN) 200 MG tablet Take 200 mg by mouth every 6 (six) hours as needed for fever or moderate pain.    Historical Provider, MD  lisinopril (PRINIVIL,ZESTRIL) 2.5 MG tablet Take 1 tablet (2.5 mg total) by mouth daily. 11/19/15   Laqueta LindenSuresh A Koneswaran, MD  metoprolol succinate (TOPROL-XL) 50 MG 24 hr tablet Take one and one half tab twice a day Take with or immediately following a meal. 01/12/16   Dolores Pattyaniel R Bensimhon, MD  raltegravir (ISENTRESS) 400 MG tablet Take 400 mg by mouth 2 (two) times daily.    Historical Provider, MD  sulfamethoxazole-trimethoprim (BACTRIM,SEPTRA) 400-80 MG tablet Take 1 tablet by mouth 3 (three) times a week. To take after dialysis days    Historical Provider, MD  tenofovir (VIREAD) 300 MG tablet Take 300 mg by mouth once a week. To be taken on Tuesday after dialysis    Historical Provider, MD  vancomycin 500 mg in sodium chloride 0.9 % 100 mL Inject 500 mg into the vein Every Tuesday,Thursday,and Saturday with dialysis. Last dose 3/22. 12/03/15   Standley Brookinganiel P Goodrich, MD    Family History Family History  Problem Relation Age of Onset  . Seizures Father     Social History Social History  Substance Use Topics  . Smoking status: Current Every Day Smoker    Packs/day: 0.50    Years: 11.00    Types: Cigarettes    Start date: 01/28/2003  . Smokeless tobacco: Never Used  . Alcohol use No  on disability since started dialysis   Allergies   Bee venom; Other; and Aspirin   Review of Systems Review of  Systems  Respiratory: Positive for shortness of breath.   Cardiovascular: Positive for chest pain, palpitations and leg swelling.  All other systems reviewed and are negative.    Physical Exam Updated Vital Signs BP 111/84 (BP Location: Right Arm)   Pulse 105   Temp 98.1 F (36.7 C) (Oral)   Resp 18   Ht 5\' 5"  (1.651 m)   Wt 130 lb (59 kg)   SpO2 100%   BMI 21.63 kg/m   Vital signs normal    Physical Exam  Constitutional: He is oriented to person, place, and time.  Non-toxic appearance. He does not appear ill. No distress.  Appears chronically ill.   HENT:  Head: Normocephalic and atraumatic.  Right Ear: External ear normal.  Left Ear: External ear normal.  Nose: Nose normal. No mucosal edema or rhinorrhea.  Mouth/Throat: Oropharynx is clear and moist and mucous membranes are normal. No dental abscesses or uvula swelling.  Eyes: Conjunctivae and EOM are normal. Pupils are  equal, round, and reactive to light.  Neck: Normal range of motion and full passive range of motion without pain. Neck supple.  Cardiovascular: Regular rhythm and normal heart sounds.  Tachycardia present.  Exam reveals no gallop and no friction rub.   No murmur heard. Pulmonary/Chest: Effort normal. No respiratory distress. He has wheezes (Diffuse). He has no rhonchi. He has no rales. He exhibits no tenderness and no crepitus.  Abdominal: Soft. Normal appearance and bowel sounds are normal. He exhibits no distension. There is no tenderness. There is no rebound and no guarding.  Musculoskeletal: Normal range of motion. He exhibits edema. He exhibits no tenderness.  Trace pitting edema of ankles.   Neurological: He is alert and oriented to person, place, and time. He has normal strength. No cranial nerve deficit.  Skin: Skin is warm, dry and intact. No rash noted. No erythema. No pallor.  Psychiatric: He has a normal mood and affect. His speech is normal and behavior is normal. His mood appears not anxious.    Nursing note and vitals reviewed.    ED Treatments / Results  Labs (all labs ordered are listed, but only abnormal results are displayed) Results for orders placed or performed during the hospital encounter of 06/10/16  Basic metabolic panel  Result Value Ref Range   Sodium 136 135 - 145 mmol/L   Potassium 4.5 3.5 - 5.1 mmol/L   Chloride 101 101 - 111 mmol/L   CO2 22 22 - 32 mmol/L   Glucose, Bld 92 65 - 99 mg/dL   BUN 65 (H) 6 - 20 mg/dL   Creatinine, Ser 16.10 (H) 0.61 - 1.24 mg/dL   Calcium 8.8 (L) 8.9 - 10.3 mg/dL   GFR calc non Af Amer 5 (L) >60 mL/min   GFR calc Af Amer 6 (L) >60 mL/min   Anion gap 13 5 - 15  CBC with Differential  Result Value Ref Range   WBC 5.7 4.0 - 10.5 K/uL   RBC 3.32 (L) 4.22 - 5.81 MIL/uL   Hemoglobin 12.9 (L) 13.0 - 17.0 g/dL   HCT 96.0 (L) 45.4 - 09.8 %   MCV 107.2 (H) 78.0 - 100.0 fL   MCH 38.9 (H) 26.0 - 34.0 pg   MCHC 36.2 (H) 30.0 - 36.0 g/dL   RDW 11.9 (H) 14.7 - 82.9 %   Platelets 216 150 - 400 K/uL   Neutrophils Relative % 66 %   Neutro Abs 3.8 1.7 - 7.7 K/uL   Lymphocytes Relative 21 %   Lymphs Abs 1.2 0.7 - 4.0 K/uL   Monocytes Relative 9 %   Monocytes Absolute 0.5 0.1 - 1.0 K/uL   Eosinophils Relative 3 %   Eosinophils Absolute 0.2 0.0 - 0.7 K/uL   Basophils Relative 1 %   Basophils Absolute 0.1 0.0 - 0.1 K/uL   Laboratory interpretation all normal except mild anemia, chronic renal failure    EKG  EKG Interpretation  Date/Time:  Saturday June 10 2016 21:00:51 EDT Ventricular Rate:  104 PR Interval:    QRS Duration: 74 QT Interval:  376 QTC Calculation: 495 R Axis:   87 Text Interpretation:  Sinus tachycardia Biatrial enlargement Borderline T abnormalities, inferior leads Prolonged QT interval Baseline wander in lead(s) II III aVF No significant change since last tracing 12 Jan 2016 Confirmed by Tymere Depuy  MD-I, Alexxa Sabet ([id number]) on 06/10/2016 11:13:35 PM       Radiology Dg Chest 2 View  Result Date:  06/10/2016 CLINICAL DATA:  Sob, palpitations  today. Missed scheduled dialysis today. History of Asthma, HIV, ESRD, A-V Fistula, heart attack. EXAM: CHEST  2 VIEW COMPARISON:  11/23/2015 FINDINGS: Mild cardiac enlargement. Pulmonary vascularity is normal. No focal airspace disease or consolidation in the lungs. No blunting of costophrenic angles. No pneumothorax. Mediastinal contours appear intact. No significant changes since previous study. IMPRESSION: Cardiac enlargement similar to prior study. No evidence of active pulmonary disease. Electronically Signed   By: Burman Nieves M.D.   On: 06/10/2016 22:04    Procedures Procedures (including critical care time)  Medications Ordered in ED Medications  ipratropium-albuterol (DUONEB) 0.5-2.5 (3) MG/3ML nebulizer solution 3 mL (3 mLs Nebulization Given 06/11/16 0002)  albuterol (PROVENTIL) (2.5 MG/3ML) 0.083% nebulizer solution 2.5 mg (2.5 mg Nebulization Given 06/11/16 0002)  acetaminophen (TYLENOL) tablet 650 mg (650 mg Oral Given 06/11/16 0117)     Initial Impression / Assessment and Plan / ED Course  I have reviewed the triage vital signs and the nursing notes.  Pertinent labs & imaging results that were available during my care of the patient were reviewed by me and considered in my medical decision making (see chart for details).  Clinical Course   After examining patient he was given a nebulizer of albuterol and Atrovent.  12:46 AM- Pt's breathing improved after treatment. His wheezing is gone. His pulse ox is 98% on room air. I'm going to have nursing staff ambulate patient to make sure he does not become hypoxic. If he does not he probably can be discharged home with the advice to use his nebulizer or inhaler for his shortness of breath and follow up if he gets worse.  Patient was ambulated by nursing staff however his pulse ox did drop to 85%. I will talk to the nephrologist about getting him dialyzed today in the hospital.  3:19 AM  Dr. Kristian Covey, nephrologist, agrees to have patient admitted and to have dialysis later today  3:35 AM Dr. Sharl Ma, hospitalist once patient admitted to telemetry, observation.    Final Clinical Impressions(s) / ED Diagnoses   Final diagnoses:  ESRD needing dialysis (HCC)  Bronchospasm   Plan admission  Devoria Albe, MD, FACEP  I personally performed the services described in this documentation, which was scribed in my presence. The recorded information has been reviewed and considered.  Devoria Albe, MD, Concha Pyo, MD 06/11/16 [phone]

## 2016-06-10 NOTE — ED Notes (Signed)
Patient ambulatory to restroom, no deficits noted. Family remains at bedside. 

## 2016-06-10 NOTE — ED Notes (Signed)
MD at bedside. 

## 2016-06-10 NOTE — ED Triage Notes (Signed)
Pt states he was unable to do to dialysis today because of car trouble. Pt states he feels sob due to extra fluid and he is having palpitations. Pt states he doesn't go back to dialysis until Tuesday.

## 2016-06-10 NOTE — ED Notes (Addendum)
Patient states he has been on dialysis since 2014, and missed dialysis today. C/o chest pressure and states "i feel like I have too much fluid" patient eating a bag of fritos prior to triage. Dialysis T, TH, SAT in [place]

## 2016-06-11 DIAGNOSIS — N186 End stage renal disease: Secondary | ICD-10-CM | POA: Diagnosis present

## 2016-06-11 DIAGNOSIS — Z21 Asymptomatic human immunodeficiency virus [HIV] infection status: Secondary | ICD-10-CM | POA: Diagnosis not present

## 2016-06-11 DIAGNOSIS — R0902 Hypoxemia: Secondary | ICD-10-CM | POA: Diagnosis not present

## 2016-06-11 DIAGNOSIS — Z8614 Personal history of Methicillin resistant Staphylococcus aureus infection: Secondary | ICD-10-CM

## 2016-06-11 DIAGNOSIS — R0600 Dyspnea, unspecified: Secondary | ICD-10-CM | POA: Diagnosis present

## 2016-06-11 DIAGNOSIS — Z992 Dependence on renal dialysis: Secondary | ICD-10-CM

## 2016-06-11 DIAGNOSIS — E8770 Fluid overload, unspecified: Secondary | ICD-10-CM | POA: Diagnosis not present

## 2016-06-11 LAB — MRSA PCR SCREENING: MRSA BY PCR: NEGATIVE

## 2016-06-11 MED ORDER — HEPARIN SODIUM (PORCINE) 5000 UNIT/ML IJ SOLN
5000.0000 [IU] | Freq: Three times a day (TID) | INTRAMUSCULAR | Status: DC
  Filled 2016-06-11: qty 1

## 2016-06-11 MED ORDER — HYDROCORTISONE ACETATE 25 MG RE SUPP
25.0000 mg | Freq: Two times a day (BID) | RECTAL | Status: DC
  Filled 2016-06-11 (×2): qty 1

## 2016-06-11 MED ORDER — SULFAMETHOXAZOLE-TRIMETHOPRIM 800-160 MG PO TABS
0.5000 | ORAL_TABLET | ORAL | Status: DC
  Administered 2016-06-11: 0.5 via ORAL
  Filled 2016-06-11: qty 1

## 2016-06-11 MED ORDER — SODIUM CHLORIDE 0.9 % IV SOLN: 100.0000 mL | INTRAVENOUS | Status: DC | PRN

## 2016-06-11 MED ORDER — SULFAMETHOXAZOLE-TRIMETHOPRIM 400-80 MG PO TABS
1.0000 | ORAL_TABLET | ORAL | Status: DC
  Filled 2016-06-11: qty 1

## 2016-06-11 MED ORDER — METOPROLOL SUCCINATE ER 50 MG PO TB24
50.0000 mg | ORAL_TABLET | Freq: Two times a day (BID) | ORAL | Status: DC
  Administered 2016-06-11: 50 mg via ORAL
  Filled 2016-06-11 (×2): qty 1

## 2016-06-11 MED ORDER — LIDOCAINE HCL (PF) 1 % IJ SOLN: 5.0000 mL | INTRAMUSCULAR | Status: DC | PRN

## 2016-06-11 MED ORDER — ONDANSETRON HCL 4 MG/2ML IJ SOLN
4.0000 mg | Freq: Four times a day (QID) | INTRAMUSCULAR | Status: DC | PRN
  Filled 2016-06-11: qty 2

## 2016-06-11 MED ORDER — PENTAFLUOROPROP-TETRAFLUOROETH EX AERO: 1.0000 "application " | INHALATION_SPRAY | CUTANEOUS | Status: DC | PRN

## 2016-06-11 MED ORDER — TENOFOVIR DISOPROXIL FUMARATE 300 MG PO TABS
300.0000 mg | ORAL_TABLET | ORAL | Status: DC
  Filled 2016-06-11: qty 1

## 2016-06-11 MED ORDER — ACETAMINOPHEN 325 MG PO TABS
650.0000 mg | ORAL_TABLET | Freq: Once | ORAL | Status: AC
  Administered 2016-06-11: 650 mg via ORAL
  Filled 2016-06-11: qty 2

## 2016-06-11 MED ORDER — LIDOCAINE-PRILOCAINE 2.5-2.5 % EX CREA: 1.0000 "application " | TOPICAL_CREAM | CUTANEOUS | Status: DC | PRN

## 2016-06-11 MED ORDER — SODIUM CHLORIDE 0.9 % IV SOLN: INTRAVENOUS | Status: DC

## 2016-06-11 MED ORDER — RALTEGRAVIR POTASSIUM 400 MG PO TABS
400.0000 mg | ORAL_TABLET | Freq: Two times a day (BID) | ORAL | Status: DC
Start: 2016-06-11 — End: 2016-06-12
  Administered 2016-06-11 (×2): 400 mg via ORAL
  Filled 2016-06-11 (×7): qty 1

## 2016-06-11 MED ORDER — EMTRICITABINE 200 MG PO CAPS
200.0000 mg | ORAL_CAPSULE | ORAL | Status: DC
  Filled 2016-06-11: qty 1

## 2016-06-11 MED ORDER — DOCUSATE SODIUM 100 MG PO CAPS
100.0000 mg | ORAL_CAPSULE | Freq: Two times a day (BID) | ORAL | Status: DC
  Filled 2016-06-11 (×2): qty 1

## 2016-06-11 MED ORDER — ETRAVIRINE 100 MG PO TABS
200.0000 mg | ORAL_TABLET | Freq: Two times a day (BID) | ORAL | Status: DC
Start: 2016-06-11 — End: 2016-06-12
  Administered 2016-06-11 (×2): 200 mg via ORAL
  Filled 2016-06-11 (×7): qty 2

## 2016-06-11 MED ORDER — SULFAMETHOXAZOLE-TRIMETHOPRIM 800-160 MG PO TABS: 0.5000 | ORAL_TABLET | ORAL | Status: DC

## 2016-06-11 MED ORDER — HEPARIN SODIUM (PORCINE) 1000 UNIT/ML DIALYSIS
20.0000 [IU]/kg | INTRAMUSCULAR | Status: DC | PRN
  Filled 2016-06-11: qty 2

## 2016-06-11 MED ORDER — ONDANSETRON HCL 4 MG PO TABS
4.0000 mg | ORAL_TABLET | Freq: Four times a day (QID) | ORAL | Status: DC | PRN
  Filled 2016-06-11: qty 1

## 2016-06-11 NOTE — ED Notes (Signed)
Patient ambulatory around nurses station, oxygen saturation started at 95% and dropped to 85% while ambulating on room air.

## 2016-06-11 NOTE — Consult Note (Signed)
Reason for Consult: Difficulty in breathing and end-stage renal disease Referring Physician: Dr. Roselee Nova is an 25 y.o. male.  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 Patient was dialyzed on Thursday but she didn't go to his regular unit yesterday because of what he calls car Complications. Presently he denies any fevers chills or sweating. When evaluated in emergency room he was found to have hypoxemia hence admitted to the hospital for dialysis. Presently patient doesn't have any nausea or vomiting.  Past Medical History:  Diagnosis Date  . A-V fistula ([place])    Placed on 10/27/2014 at [facility]; for future HD use.   . Asthma   . ESRD (end stage renal disease) ([place])   . Heart attack ([place])   . HIV (human immunodeficiency virus infection) ([place])   . Staphylococcus aureus bacteremia with sepsis ([place]) 09/09/2014  . Systolic dysfunction, left ventricle 12/20/2014   EF 20%     Past Surgical History:  Procedure Laterality Date  . INSERTION OF DIALYSIS CATHETER Left 09/15/2014   Procedure: INSERTION OF DIALYSIS CATHETER;  Surgeon: Serafina Mitchell, MD;  Location: [place];  Service: Vascular;  Laterality: Left;  . PORT A CATH REVISION    . TEE WITHOUT CARDIOVERSION N/A 09/14/2014   Procedure: TRANSESOPHAGEAL ECHOCARDIOGRAM (TEE);  Surgeon: Larey Dresser, MD;  Location: [facility] ENDOSCOPY;  Service: Cardiovascular;  Laterality: N/A;  will be at [facility] by mon    Family History  Problem Relation Age of Onset  . Seizures Father     Social History:  reports that he has been smoking Cigarettes.  He started smoking about 13 years ago. He has a 5.50 pack-year smoking history. He has never used smokeless tobacco. He reports that he uses drugs, including Marijuana. He reports that he does not drink alcohol.  Allergies:  Allergies   Allergen Reactions  . Bee Venom Anaphylaxis  . Other Anaphylaxis    mushrooms  . Aspirin Other (See Comments)    Reaction is unknown    Medications: I have reviewed the patient's current medications.  Results for orders placed or performed during the hospital encounter of 06/10/16 (from the past 48 hour(s))  Basic metabolic panel     Status: Abnormal   Collection Time: 06/10/16  9:25 PM  Result Value Ref Range   Sodium 136 135 - 145 mmol/L   Potassium 4.5 3.5 - 5.1 mmol/L   Chloride 101 101 - 111 mmol/L   CO2 22 22 - 32 mmol/L   Glucose, Bld 92 65 - 99 mg/dL   BUN 65 (H) 6 - 20 mg/dL   Creatinine, Ser 12.54 (H) 0.61 - 1.24 mg/dL   Calcium 8.8 (L) 8.9 - 10.3 mg/dL   GFR calc non Af Amer 5 (L) >60 mL/min   GFR calc Af Amer 6 (L) >60 mL/min    Comment: (NOTE) The eGFR has been calculated using the CKD EPI equation. This calculation has not been validated in all clinical situations. eGFR's persistently <60 mL/min signify possible Chronic Kidney Disease.    Anion gap 13 5 - 15  CBC with Differential     Status: Abnormal   Collection Time: 06/10/16  9:25 PM  Result Value Ref Range   WBC 5.7 4.0 - 10.5 K/uL   RBC 3.32 (L) 4.22 - 5.81 MIL/uL   Hemoglobin 12.9 (L)  13.0 - 17.0 g/dL   HCT 35.6 (L) 39.0 - 52.0 %   MCV 107.2 (H) 78.0 - 100.0 fL   MCH 38.9 (H) 26.0 - 34.0 pg   MCHC 36.2 (H) 30.0 - 36.0 g/dL   RDW 16.9 (H) 11.5 - 15.5 %   Platelets 216 150 - 400 K/uL   Neutrophils Relative % 66 %   Neutro Abs 3.8 1.7 - 7.7 K/uL   Lymphocytes Relative 21 %   Lymphs Abs 1.2 0.7 - 4.0 K/uL   Monocytes Relative 9 %   Monocytes Absolute 0.5 0.1 - 1.0 K/uL   Eosinophils Relative 3 %   Eosinophils Absolute 0.2 0.0 - 0.7 K/uL   Basophils Relative 1 %   Basophils Absolute 0.1 0.0 - 0.1 K/uL    Dg Chest 2 View  Result Date: 06/10/2016 CLINICAL DATA:  Sob, palpitations today. Missed scheduled dialysis today. History of Asthma, HIV, ESRD, A-V Fistula, heart attack. EXAM: CHEST  2 VIEW  COMPARISON:  11/23/2015 FINDINGS: Mild cardiac enlargement. Pulmonary vascularity is normal. No focal airspace disease or consolidation in the lungs. No blunting of costophrenic angles. No pneumothorax. Mediastinal contours appear intact. No significant changes since previous study. IMPRESSION: Cardiac enlargement similar to prior study. No evidence of active pulmonary disease. Electronically Signed   By: Lucienne Capers M.D.   On: 06/10/2016 22:04    Review of Systems  Constitutional: Negative for chills and fever.  HENT: Positive for congestion.   Respiratory: Positive for cough, sputum production, shortness of breath and wheezing.   Cardiovascular: Positive for orthopnea. Negative for chest pain and palpitations.  Gastrointestinal: Negative for nausea and vomiting.   Blood pressure 111/87, pulse (!) 104, temperature 97.7 F (36.5 C), temperature source Oral, resp. rate 19, height _0  (1.651 m), weight 54.8 kg (120 lb 14.4 oz), SpO2 100 %. Physical Exam  Constitutional: He is oriented to person, place, and time. No distress.  Neck: JVD present.  Cardiovascular: Exam reveals gallop.   Murmur heard. Respiratory: No respiratory distress. He has wheezes.  GI: He exhibits no distension. There is no tenderness.  Musculoskeletal: He exhibits no edema.  Neurological: He is alert and oriented to person, place, and time.    Assessment/Plan: Problem #1 difficulty in breathing: Possibly accommodation of asthma and fluid overload possibly from noncompliance with his salt and fluid intake/ dialysis. Problem #2 end-stage renal disease: As stated above patient was dialyzed Thursday. Presently he denies any nausea or vomiting. His potassium is normal. Problem #3 anemia: His hemoglobin is above our target goal Problem #4 Metabolic bone  disease: His calcium is range Problem #5  history of HIV Problem #6 history of asthma: Patient on inhaler Problem #7 history of cardiomyopathy: His blood pressure is  reasonably controlled Plan: We'll make arrangements for patient to get dialysis today. We'll dialyze him for 3-1/2 hours and try to remove about 3 L if his blood pressure tolerates When patient is discharged to go to his regular dialysis center for outpatient dialysis.  Mahira Petras S 06/11/2016, 8:32 AM

## 2016-06-11 NOTE — Procedures (Signed)
   HEMODIALYSIS TREATMENT NOTE:  3.5 hour low-heparin dialysis completed via left upper arm AVG (15g ante/retrograde). Goal met: 3.1 liters removed without interruption in ultrafiltration.  All blood was returned and hemostasis was achieved within 15 minutes. Report called to Gershon Crane, RN.  Rockwell Alexandria, RN, CDN

## 2016-06-11 NOTE — Progress Notes (Addendum)
Patient ID: Dave Estrada, male   DOB: [birth year], 25 y.o.   MRN: [id number]                                                                PROGRESS NOTE                                                                                                                                                                                                             Patient Demographics:    Dave Estrada, is a 25 y.o. male, DOB - [birth year], [id number]  Admit date - 06/10/2016   Admitting Physician Meredeth IdeGagan S Lama, MD  Outpatient Primary MD for the patient is JASON [name], MD  LOS - 0  Outpatient Specialists:    Chief Complaint  Patient presents with  . Palpitations       Brief Narrative   25 y.o. male.  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 Patient was dialyzed on Thursday but she didn't go to his regular unit yesterday because of what he calls car Complications. Presently he denies any fevers chills or sweating. When evaluated in emergency room he was found to have hypoxemia hence admitted to the hospital for dialysis. Presently patient doesn't have any nausea or vomiting.   Subjective:    Dave Estrada today has slight dyspnea.  Pt missed his dialysis yesterday.  Will be dialyzed today by [facility].     No headache, No chest pain, No abdominal pain - No Nausea, No new weakness tingling or numbness, No Cough , No fever.    Assessment  & Plan :    Active Problems:   HIV (human immunodeficiency virus infection) (HCC)   ESRD on hemodialysis (HCC)   History of MRSA infection   ESRD needing dialysis (HCC)   Dyspnea    1. Dyspnea on exertion- patient's O2 sats dropped to 80% on ambulation, currently not requiring oxygen and O2 sats 97 - 100% on room air. Likely from underlying pulmonary edema/CHF. Patient  will require hemodialysis. 2. ESRD on hemodialysis- nephrology has been consulted by the ED physician. Patient will undergo dialysis today. 3. HIV- stable: Continue antiretroviral  therapy 4. History of MRSA infection- patient takes Bactrim 1 tablet 3 times a week after hemodialysis.    Code Status : FULL CODE  Family Communication  : w patient  Disposition Plan  : home  Barriers For Discharge :   Consults  :  nephrology  Procedures  : dialysis 8/27  DVT Prophylaxis  :  SCD  Lab Results  Component Value Date   PLT 216 06/10/2016    Antibiotics  :  See below  Anti-infectives    Start     Dose/Rate Route Frequency Ordered Stop   06/13/16 1600  tenofovir (VIREAD) tablet 300 mg    Comments:  To be taken on Tuesday after dialysis     300 mg Oral Weekly 06/11/16 0455     06/12/16 0900  sulfamethoxazole-trimethoprim (BACTRIM,SEPTRA) 400-80 MG per tablet 1 tablet  Status:  Discontinued    Comments:  To take after dialysis days     1 tablet Oral Once per day on Mon Wed Fri 06/11/16 0455 06/11/16 0816   06/12/16 0900  emtricitabine (EMTRIVA) capsule 200 mg    Comments:  Taken after dialysis on Tuesday and Thursday     200 mg Oral Once per day on Mon Thu 06/11/16 0455     06/12/16 0900  sulfamethoxazole-trimethoprim (BACTRIM DS,SEPTRA DS) 800-160 MG per tablet 0.5 tablet     0.5 tablet Oral Once per day on Mon Wed Fri 06/11/16 0816     06/11/16 1000  etravirine (INTELENCE) tablet 200 mg     200 mg Oral Every 12 hours 06/11/16 0455     06/11/16 1000  raltegravir (ISENTRESS) tablet 400 mg     400 mg Oral 2 times daily 06/11/16 0455          Objective:   Vitals:   06/11/16 0245 06/11/16 0333 06/11/16 0400 06/11/16 0456  BP:  104/71 118/93 111/87  Pulse: 114 110  (!) 104  Resp:  12 19   Temp:    97.7 F (36.5 C)  TempSrc:    Oral  SpO2: 100% 97%  100%  Weight:    54.8 kg (120 lb 14.4 oz)  Height:    5\' 5"  (1.651 m)    Wt Readings from Last 3 Encounters:  06/11/16 54.8  kg (120 lb 14.4 oz)  05/30/16 61.2 kg (135 lb)  01/12/16 52.6 kg (116 lb)    No intake or output data in the 24 hours ending 06/11/16 1000   Physical Exam  Awake Alert, Oriented X 3, No new F.N deficits, Normal affect [place].AT,PERRAL Supple Neck,No JVD, No cervical lymphadenopathy appriciated.  Symmetrical Chest wall movement, Good air movement bilaterally, CTAB RRR,No Gallops,Rubs or new Murmurs, No Parasternal Heave +ve B.Sounds, Abd Soft, No tenderness, No organomegaly appriciated, No rebound - guarding or rigidity. No Cyanosis, Clubbing or edema, No new Rash or bruise   L AVF, + bruit, /thrill.     Data Review:    CBC  Recent Labs Lab 06/10/16 2125  WBC 5.7  HGB 12.9*  HCT 35.6*  PLT 216  MCV 107.2*  MCH 38.9*  MCHC 36.2*  RDW 16.9*  LYMPHSABS 1.2  MONOABS 0.5  EOSABS 0.2  BASOSABS 0.1    Chemistries   Recent Labs Lab 06/10/16 2125  NA 136  K 4.5  CL 101  CO2 22  GLUCOSE 92  BUN 65*  CREATININE 12.54*  CALCIUM 8.8*   ------------------------------------------------------------------------------------------------------------------ No results for input(s): CHOL, HDL, LDLCALC, TRIG, CHOLHDL, LDLDIRECT in the  last 72 hours.  No results found for: HGBA1C ------------------------------------------------------------------------------------------------------------------ No results for input(s): TSH, T4TOTAL, T3FREE, THYROIDAB in the last 72 hours.  Invalid input(s): FREET3 ------------------------------------------------------------------------------------------------------------------ No results for input(s): VITAMINB12, FOLATE, FERRITIN, TIBC, IRON, RETICCTPCT in the last 72 hours.  Coagulation profile No results for input(s): INR, PROTIME in the last 168 hours.  No results for input(s): DDIMER in the last 72 hours.  Cardiac Enzymes No results for input(s): CKMB, TROPONINI, MYOGLOBIN in the last 168 hours.  Invalid input(s):  CK ------------------------------------------------------------------------------------------------------------------    Component Value Date/Time   BNP >4,500.0 (H) 09/11/2015 0150    Inpatient Medications  Scheduled Meds: . docusate sodium  100 mg Oral Q12H  . [START ON 06/12/2016] emtricitabine  200 mg Oral Once per day on Mon Thu  . etravirine  200 mg Oral Q12H  . heparin  5,000 Units Subcutaneous Q8H  . hydrocortisone  25 mg Rectal BID  . metoprolol succinate  50 mg Oral BID  . raltegravir  400 mg Oral BID  . [START ON 06/12/2016] sulfamethoxazole-trimethoprim  0.5 tablet Oral Once per day on Mon Wed Fri  . [START ON 06/13/2016] tenofovir  300 mg Oral Weekly   Continuous Infusions: . sodium chloride     PRN Meds:.ondansetron **OR** ondansetron (ZOFRAN) IV  Micro Results Recent Results (from the past 240 hour(s))  MRSA PCR Screening     Status: None   Collection Time: 06/11/16  5:13 AM  Result Value Ref Range Status   MRSA by PCR NEGATIVE NEGATIVE Final    Comment:        The GeneXpert MRSA Assay (FDA approved for NASAL specimens only), is one component of a comprehensive MRSA colonization surveillance program. It is not intended to diagnose MRSA infection nor to guide or monitor treatment for MRSA infections.     Radiology Reports Dg Chest 2 View  Result Date: 06/10/2016 CLINICAL DATA:  Sob, palpitations today. Missed scheduled dialysis today. History of Asthma, HIV, ESRD, A-V Fistula, heart attack. EXAM: CHEST  2 VIEW COMPARISON:  11/23/2015 FINDINGS: Mild cardiac enlargement. Pulmonary vascularity is normal. No focal airspace disease or consolidation in the lungs. No blunting of costophrenic angles. No pneumothorax. Mediastinal contours appear intact. No significant changes since previous study. IMPRESSION: Cardiac enlargement similar to prior study. No evidence of active pulmonary disease. Electronically Signed   By: Burman Nieves M.D.   On: 06/10/2016 22:04     Time Spent in minutes  30   Pearson Grippe M.D on 06/11/2016 at 10:00 AM  Between 7am to 7pm - Pager - [phone]  After 7pm go to [email or web address] - password [email or web address]  [facility] -  Office  [phone]  Time from 10am to 10:45am

## 2016-06-11 NOTE — H&P (Signed)
TRH H&P   Patient Demographics:    Dave Estrada, is a 25 y.o. male  MRN: [id number]  DOB - 1991-02-10  Admit Date - 06/10/2016  Outpatient Primary MD for the patient is JASON [name], MD  Referring MD/NP/PA: Dr. Lynelle Doctor  Patient coming from: Home  Chief Complaint  Patient presents with  . Palpitations      HPI:    Dave Estrada  is a 25 y.o. male, *With history of HIV, ESRD on hemodialysis, asthma, MI in 2014, congestive heart failure who came to the ED with worsening shortness of breath started earlier today. Patient says that he also has been experiencing palpitations. He denies chest pain, patient is on hemodialysis Tuesday to Thursday and Saturday. Patient says that he skipped his dialysis session on Saturday usual transportation issue. He denies nausea vomiting or diarrhea. No fever or chills.    Review of systems:    In addition to the HPI above,  No Fever-chills, No Headache, No changes with Vision or hearing, No problems swallowing food or Liquids, No Abdominal pain, No Nausea or Vomiting, bowel movements are regular, No Blood in stool or Urine, No dysuria, No new skin rashes or bruises, No new joints pains-aches,  No new weakness, tingling, numbness in any extremity, No recent weight gain or loss, No polyuria, polydypsia or polyphagia, No significant Mental Stressors.  A full 10 point Review of Systems was done, except as stated above, all other Review of Systems were negative.   With Past History of the following :    Past Medical History:  Diagnosis Date  . A-V fistula (HCC)    Placed on 10/27/2014 at [facility]; for future HD use.   . Asthma   . ESRD (end stage renal disease) (HCC)   . Heart attack (HCC)   . HIV (human immunodeficiency virus infection) (HCC)   . Staphylococcus aureus bacteremia with sepsis (HCC) 09/09/2014  . Systolic dysfunction, left ventricle  12/20/2014   EF 20%       Past Surgical History:  Procedure Laterality Date  . INSERTION OF DIALYSIS CATHETER Left 09/15/2014   Procedure: INSERTION OF DIALYSIS CATHETER;  Surgeon: Nada Libman, MD;  Location: [facility] OR;  Service: Vascular;  Laterality: Left;  . PORT A CATH REVISION    . TEE WITHOUT CARDIOVERSION N/A 09/14/2014   Procedure: TRANSESOPHAGEAL ECHOCARDIOGRAM (TEE);  Surgeon: Laurey Morale, MD;  Location: [facility] ENDOSCOPY;  Service: Cardiovascular;  Laterality: N/A;  will be at [facility] by mon      Social History:     Social History  Substance Use Topics  . Smoking status: Current Every Day Smoker    Packs/day: 0.50    Years: 11.00    Types: Cigarettes    Start date: 01/28/2003  . Smokeless tobacco: Never Used  . Alcohol use No        Family History :     Family History  Problem Relation Age of Onset  . Seizures Father  Home Medications:   Prior to Admission medications   Medication Sig Start Date End Date Taking? Authorizing Provider  albuterol (PROVENTIL HFA;VENTOLIN HFA) 108 (90 BASE) MCG/ACT inhaler Inhale 1-2 puffs into the lungs every 6 (six) hours as needed for wheezing or shortness of breath.    Historical Provider, MD  docusate sodium (COLACE) 100 MG capsule Take 1 capsule (100 mg total) by mouth every 12 (twelve) hours. 05/30/16   Jacalyn Lefevre, MD  emtricitabine (EMTRIVA) 200 MG capsule Take 200 mg by mouth 2 (two) times a week. Taken after dialysis on Tuesday and Thursday    Historical Provider, MD  etravirine (INTELENCE) 100 MG tablet Take 200 mg by mouth every 12 (twelve) hours.    Historical Provider, MD  hydrocortisone (ANUSOL-HC) 25 MG suppository Place 1 suppository (25 mg total) rectally 2 (two) times daily. 05/30/16   Jacalyn Lefevre, MD  ibuprofen (ADVIL,MOTRIN) 200 MG tablet Take 200 mg by mouth every 6 (six) hours as needed for fever or moderate pain.    Historical Provider, MD  lisinopril (PRINIVIL,ZESTRIL) 2.5 MG tablet Take 1 tablet (2.5 mg  total) by mouth daily. 11/19/15   Laqueta Linden, MD  metoprolol succinate (TOPROL-XL) 50 MG 24 hr tablet Take one and one half tab twice a day Take with or immediately following a meal. 01/12/16   Dolores Patty, MD  raltegravir (ISENTRESS) 400 MG tablet Take 400 mg by mouth 2 (two) times daily.    Historical Provider, MD  sulfamethoxazole-trimethoprim (BACTRIM,SEPTRA) 400-80 MG tablet Take 1 tablet by mouth 3 (three) times a week. To take after dialysis days    Historical Provider, MD  tenofovir (VIREAD) 300 MG tablet Take 300 mg by mouth once a week. To be taken on Tuesday after dialysis    Historical Provider, MD  vancomycin 500 mg in sodium chloride 0.9 % 100 mL Inject 500 mg into the vein Every Tuesday,Thursday,and Saturday with dialysis. Last dose 3/22. 12/03/15   Standley Brooking, MD     Allergies:     Allergies  Allergen Reactions  . Bee Venom Anaphylaxis  . Other Anaphylaxis    mushrooms  . Aspirin Other (See Comments)    Reaction is unknown     Physical Exam:   Vitals  Blood pressure 104/71, pulse 110, temperature 98.1 F (36.7 C), temperature source Oral, resp. rate 12, height 5\' 5"  (1.651 m), weight 59 kg (130 lb), SpO2 97 %.   1. General African-American male* lying in bed in NAD, cooperative with exam  2. Normal affect and insight, Awake Alert, Oriented X 3.  3. No F.N deficits, ALL C.Nerves Intact, Strength 5/5 all 4 extremities, Sensation intact all 4 extremities, Plantars down going.  4. Ears and Eyes appear Normal, Conjunctivae clear, PERRLA. Moist Oral Mucosa.  5. Supple Neck, No JVD, No cervical lymphadenopathy appriciated, No Carotid Bruits.  6. Symmetrical Chest wall movement, Good air movement bilaterally, CTAB.  7. RRR, No Gallops, Rubs or Murmurs, No Parasternal Heave.No Leg edema  8. Positive Bowel Sounds, Abdomen Soft, No tenderness, No organomegaly appriciated,No rebound -guarding or rigidity.  9.  No Cyanosis, Normal Skin Turgor, No Skin  Rash or Bruise.  10. Good muscle tone,  joints appear normal , no effusions, Normal ROM.      Data Review:    CBC  Recent Labs Lab 06/10/16 2125  WBC 5.7  HGB 12.9*  HCT 35.6*  PLT 216  MCV 107.2*  MCH 38.9*  MCHC 36.2*  RDW 16.9*  LYMPHSABS 1.2  MONOABS 0.5  EOSABS 0.2  BASOSABS 0.1   ------------------------------------------------------------------------------------------------------------------  Chemistries   Recent Labs Lab 06/10/16 2125  NA 136  K 4.5  CL 101  CO2 22  GLUCOSE 92  BUN 65*  CREATININE 12.54*  CALCIUM 8.8*   ------------------------------------------------------------------------------------------------------------------  ------------------------------------------------------------------------------------------------------------------  --------------------------------------------------------------------------------------------------------------- Urine analysis:    Component Value Date/Time   COLORURINE YELLOW 12/17/2014 0017   APPEARANCEUR CLEAR 12/17/2014 0017   LABSPEC 1.020 12/17/2014 0017   PHURINE 6.5 12/17/2014 0017   GLUCOSEU NEGATIVE 12/17/2014 0017   HGBUR MODERATE (A) 12/17/2014 0017   BILIRUBINUR SMALL (A) 12/17/2014 0017   KETONESUR NEGATIVE 12/17/2014 0017   PROTEINUR >300 (A) 12/17/2014 0017   UROBILINOGEN 0.2 12/17/2014 0017   NITRITE NEGATIVE 12/17/2014 0017   LEUKOCYTESUR NEGATIVE 12/17/2014 0017      ----------------------------------------------------------------------------------------------------------------   Imaging Results:    Dg Chest 2 View  Result Date: 06/10/2016 CLINICAL DATA:  Sob, palpitations today. Missed scheduled dialysis today. History of Asthma, HIV, ESRD, A-V Fistula, heart attack. EXAM: CHEST  2 VIEW COMPARISON:  11/23/2015 FINDINGS: Mild cardiac enlargement. Pulmonary vascularity is normal. No focal airspace disease or consolidation in the lungs. No blunting of costophrenic angles. No  pneumothorax. Mediastinal contours appear intact. No significant changes since previous study. IMPRESSION: Cardiac enlargement similar to prior study. No evidence of active pulmonary disease. Electronically Signed   By: Burman NievesWilliam  Stevens M.D.   On: 06/10/2016 22:04    My personal review of EKG: Rhythm NSR, QTC 495   Assessment & Plan:    Active Problems:   HIV (human immunodeficiency virus infection) (HCC)   ESRD on hemodialysis (HCC)   History of MRSA infection   ESRD needing dialysis (HCC)   Dyspnea    1. Dyspnea on exertion- patient's O2 sats dropped to 80% on ambulation, currently not requiring oxygen and O2 sats 97 - 100% on room air. Likely from underlying pulmonary edema/CHF. Patient will require hemodialysis. 2. ESRD on hemodialysis- nephrology has been consulted by the ED physician. Patient will undergo dialysis today. 3. HIV- stable: Continue antiretroviral therapy 4. History of MRSA infection- patient takes Bactrim 1 tablet 3 times a week after hemodialysis. 5.    DVT Prophylaxis-   heparin  AM Labs Ordered, also please review Full Orders  Family Communication: Admission, patients condition and plan of care including tests being ordered have been discussed with the patient and his father and mother at bedside* who indicate understanding and agree with the plan and Code Status.  Code Status:  Full code  Admission status: Observation    Time spent in minutes : 60 minutes   LAMA,GAGAN S M.D on 06/11/2016 at 4:02 AM  Between 7am to 7pm - Pager - [phone]. After 7pm go to [email or web address] - password [email or web address]  [facility] - Office  [phone]

## 2016-06-11 NOTE — ED Notes (Signed)
Patient requesting to eat and drink. EDP has asked for patient not to eat and drink until she hears back from nephrology. Patient appears mad because he cannot eat/drink what he wants to, patient threw items in room and spilt drink on floor. Patient states understanding of why EDP does not want him to drink, family at bedside also states understanding.

## 2016-06-12 DIAGNOSIS — Z21 Asymptomatic human immunodeficiency virus [HIV] infection status: Secondary | ICD-10-CM | POA: Diagnosis not present

## 2016-06-12 DIAGNOSIS — N186 End stage renal disease: Secondary | ICD-10-CM | POA: Diagnosis not present

## 2016-06-12 DIAGNOSIS — Z992 Dependence on renal dialysis: Secondary | ICD-10-CM | POA: Diagnosis not present

## 2016-06-12 DIAGNOSIS — E8770 Fluid overload, unspecified: Secondary | ICD-10-CM | POA: Diagnosis not present

## 2016-06-12 DIAGNOSIS — R0600 Dyspnea, unspecified: Secondary | ICD-10-CM | POA: Diagnosis not present

## 2016-06-12 MED ORDER — HYDROCODONE-ACETAMINOPHEN 5-325 MG PO TABS
1.0000 | ORAL_TABLET | Freq: Once | ORAL | Status: AC
Start: 2016-06-12 — End: 2016-06-12
  Administered 2016-06-12: 1 via ORAL
  Filled 2016-06-12: qty 1

## 2016-06-12 NOTE — Care Management Obs Status (Signed)
MEDICARE OBSERVATION STATUS NOTIFICATION   Patient Details  Name: Dave Estrada MRN: [id number] Date of Birth: 03-14-1991   Medicare Observation Status Notification Given:  Yes    Miraya Cudney, Chrystine Oiler, RN 06/12/2016, 8:40 AM

## 2016-06-12 NOTE — Discharge Instructions (Signed)
PLEASE GET YOUR DIALYSIS T, T, S as scheduled by your nephrologist,  Please f/u with your nephrologist on Tuesday at Dialysis

## 2016-06-12 NOTE — Progress Notes (Signed)
Subjective: Interval History: has no complaint of difficulty in breathing. He didn't have any cough. His appetite is good and no nausea or vomiting..  Objective: Vital signs in last 24 hours: Temp:  [97.5 F (36.4 C)-98.4 F (36.9 C)] 97.5 F (36.4 C) (08/28 0600) Pulse Rate:  [80-109] 80 (08/28 0600) Resp:  [16-20] 20 (08/28 0600) BP: (100-130)/(56-87) 104/87 (08/28 0600) SpO2:  [99 %-100 %] 100 % (08/28 0600) Weight:  [52.3 kg (115 lb 4.8 oz)-55.1 kg (121 lb 7.6 oz)] 52.3 kg (115 lb 4.8 oz) (08/28 0600) Weight change: -3.867 kg (-8 lb 8.4 oz)  Intake/Output from previous day: 08/27 0701 - 08/28 0700 In: 240 [P.O.:240] Out: 3157  Intake/Output this shift: No intake/output data recorded.  Generally patient is alert and in no apparent distress Chest: Decreased breath sound bilaterally, no rales rhonchi or egophony Heart exam revealed regular rate and rhythm S 3/6 systolic ejection murmur Extremities no edema  Lab Results:  Recent Labs  06/10/16 2125  WBC 5.7  HGB 12.9*  HCT 35.6*  PLT 216   BMET:  Recent Labs  06/10/16 2125  NA 136  K 4.5  CL 101  CO2 22  GLUCOSE 92  BUN 65*  CREATININE 12.54*  CALCIUM 8.8*   No results for input(s): PTH in the last 72 hours. Iron Studies: No results for input(s): IRON, TIBC, TRANSFERRIN, FERRITIN in the last 72 hours.  Studies/Results: Dg Chest 2 View  Result Date: 06/10/2016 CLINICAL DATA:  Sob, palpitations today. Missed scheduled dialysis today. History of Asthma, HIV, ESRD, A-V Fistula, heart attack. EXAM: CHEST  2 VIEW COMPARISON:  11/23/2015 FINDINGS: Mild cardiac enlargement. Pulmonary vascularity is normal. No focal airspace disease or consolidation in the lungs. No blunting of costophrenic angles. No pneumothorax. Mediastinal contours appear intact. No significant changes since previous study. IMPRESSION: Cardiac enlargement similar to prior study. No evidence of active pulmonary disease. Electronically Signed   By:  Burman Nieves M.D.   On: 06/10/2016 22:04    I have reviewed the patient's current medications.  Assessment/Plan: Problem #1 difficulty breathing: Possibly a combination of exacerbation of his asthma and fluid overload. Patient was dialyzed in 3 L of fluid was removed. Presently he is feeling much better. Problem #2 end-stage renal disease: He doesn't have any nausea or vomiting. His potassium is normal. Problem #3 history of AIDS Problem #4 anemia: His hemoglobin is above our target goal patient is off Epogen Problem #5 hypertension: His blood pressure is reasonably controlled Problem #6 metabolic on disease: His calcium is range Plan: Patient does require dialysis today History dialysis will be tomorrow which is his regular dialysis day. If patient is going to discharge he will go to his regular unit in a.m.    LOS: 0 days   Yania Bogie S 06/12/2016,8:27 AM

## 2016-06-12 NOTE — Discharge Summary (Signed)
Dave Estrada, is a 25 y.o. male  DOB [birth year]  MRN [id number].  Admission date:  06/10/2016  Admitting Physician  Meredeth Ide, MD  Discharge Date:  06/12/2016   Primary MD  JASON [name], MD  Recommendations for primary care physician for things to follow:   ESRD on HD T, T, S Pt had dialysis on 8/27 due to missing his dialysis on 8/26 Resume regular scheduled dialysis  Dyspnea likely secondary to fluid overload from missing dialysis.    HIV Resume prior medications   Admission Diagnosis  Bronchospasm [J98.01] ESRD needing dialysis (HCC) [N18.6]   Discharge Diagnosis  Bronchospasm [J98.01] ESRD needing dialysis (HCC) [N18.6]    Active Problems:   HIV (human immunodeficiency virus infection) (HCC)   ESRD on hemodialysis (HCC)   History of MRSA infection   ESRD needing dialysis (HCC)   Dyspnea      Past Medical History:  Diagnosis Date  . A-V fistula (HCC)    Placed on 10/27/2014 at [facility]; for future HD use.   . Asthma   . ESRD (end stage renal disease) (HCC)   . Heart attack (HCC)   . HIV (human immunodeficiency virus infection) (HCC)   . Staphylococcus aureus bacteremia with sepsis (HCC) 09/09/2014  . Systolic dysfunction, left ventricle 12/20/2014   EF 20%     Past Surgical History:  Procedure Laterality Date  . INSERTION OF DIALYSIS CATHETER Left 09/15/2014   Procedure: INSERTION OF DIALYSIS CATHETER;  Surgeon: Nada Libman, MD;  Location: [facility] OR;  Service: Vascular;  Laterality: Left;  . PORT A CATH REVISION    . TEE WITHOUT CARDIOVERSION N/A 09/14/2014   Procedure: TRANSESOPHAGEAL ECHOCARDIOGRAM (TEE);  Surgeon: Laurey Morale, MD;  Location: [facility] ENDOSCOPY;  Service: Cardiovascular;  Laterality: N/A;  will be at [facility] by mon       HPI  from the history and physical done on the day of admission:    patient who has a history of her cardiomyopathy, AIDS, end-stage  renal disease on maintenance hemodialysis presently came complaints of cough, difficulty breathing and wheezing for the last 24 hours. Patient was dialyzed on Thursday but she didn't go to his regular unit yesterday because of what he calls car Complications. Presently he denies any fevers chills or sweating. When evaluated in emergency room he was found to have hypoxemia hence admitted to the hospital for dialysis. Presently patient doesn't have any nausea or vomiting.     Hospital Course:     Pt had dialysis on 8/27 , breathing better and back to baseline.  Pt states he has a means of transportation now to his dialysis.  Feels stable and would like to be discharged today to home.  Pt can't recall his dialysis/nephrologist name.     Follow UP  Follow-up Information    JASON [name], MD .   Specialty:  Infectious Diseases Contact information: [address] [place] [address] [phone]  Consults obtained - nephrology  Discharge Condition: stable  Diet and Activity recommendation: See Discharge Instructions below  Discharge Instructions    Please go to your dialysis. Please f/u with your nephrologist this week.        Discharge Medications       Medication List    TAKE these medications   albuterol 108 (90 Base) MCG/ACT inhaler Commonly known as:  PROVENTIL HFA;VENTOLIN HFA Inhale 1-2 puffs into the lungs every 6 (six) hours as needed for wheezing or shortness of breath.   emtricitabine 200 MG capsule Commonly known as:  EMTRIVA Take 200 mg by mouth 2 (two) times a week. Taken after dialysis on Tuesday and Thursday   etravirine 100 MG tablet Commonly known as:  INTELENCE Take 200 mg by mouth every 12 (twelve) hours.   lisinopril 2.5 MG tablet Commonly known as:  PRINIVIL,ZESTRIL Take 1 tablet (2.5 mg total) by mouth daily.   metoprolol succinate 50 MG 24 hr tablet Commonly known as:  TOPROL-XL Take one and one half tab  twice a day Take with or immediately following a meal.   raltegravir 400 MG tablet Commonly known as:  ISENTRESS Take 400 mg by mouth 2 (two) times daily.   sulfamethoxazole-trimethoprim 400-80 MG tablet Commonly known as:  BACTRIM,SEPTRA Take 1 tablet by mouth 3 (three) times a week. To take after dialysis days   tenofovir 300 MG tablet Commonly known as:  VIREAD Take 300 mg by mouth once a week. To be taken on Tuesday after dialysis   vancomycin 500 mg in sodium chloride 0.9 % 100 mL Inject 500 mg into the vein Every Tuesday,Thursday,and Saturday with dialysis. Last dose 3/22.       Major procedures and Radiology Reports - PLEASE review detailed and final reports for all details, in brief -      Dg Chest 2 View  Result Date: 06/10/2016 CLINICAL DATA:  Sob, palpitations today. Missed scheduled dialysis today. History of Asthma, HIV, ESRD, A-V Fistula, heart attack. EXAM: CHEST  2 VIEW COMPARISON:  11/23/2015 FINDINGS: Mild cardiac enlargement. Pulmonary vascularity is normal. No focal airspace disease or consolidation in the lungs. No blunting of costophrenic angles. No pneumothorax. Mediastinal contours appear intact. No significant changes since previous study. IMPRESSION: Cardiac enlargement similar to prior study. No evidence of active pulmonary disease. Electronically Signed   By: Burman Nieves M.D.   On: 06/10/2016 22:04    Micro Results     Recent Results (from the past 240 hour(s))  MRSA PCR Screening     Status: None   Collection Time: 06/11/16  5:13 AM  Result Value Ref Range Status   MRSA by PCR NEGATIVE NEGATIVE Final    Comment:        The GeneXpert MRSA Assay (FDA approved for NASAL specimens only), is one component of a comprehensive MRSA colonization surveillance program. It is not intended to diagnose MRSA infection nor to guide or monitor treatment for MRSA infections.        Today   Subjective    Dave Estrada today has no  headache,no chest abdominal pain,no new weakness tingling or numbness, feels much better wants to go home today.   Objective   Blood pressure 104/87, pulse 80, temperature 97.5 F (36.4 C), temperature source Oral, resp. rate 20, height 5\' 5"  (1.651 m), weight 52.3 kg (115 lb 4.8 oz), SpO2 100 %.   Intake/Output Summary (Last 24 hours) at 06/12/16 0801 Last data filed at 06/11/16 1700  Gross  per 24 hour  Intake              240 ml  Output             3157 ml  Net            -2917 ml    Exam Awake Alert, Oriented x 3, No new F.N deficits, Normal affect [place].AT,PERRAL Supple Neck,No JVD, No cervical lymphadenopathy appriciated.  Symmetrical Chest wall movement, Good air movement bilaterally, CTAB RRR,No Gallops,Rubs or new Murmurs, No Parasternal Heave +ve B.Sounds, Abd Soft, Non tender, No organomegaly appriciated, No rebound -guarding or rigidity. No Cyanosis, Clubbing or edema, No new Rash or bruise+ L AVF  Data Review   CBC w Diff:  Lab Results  Component Value Date   WBC 5.7 06/10/2016   HGB 12.9 (L) 06/10/2016   HCT 35.6 (L) 06/10/2016   PLT 216 06/10/2016   LYMPHOPCT 21 06/10/2016   MONOPCT 9 06/10/2016   EOSPCT 3 06/10/2016   BASOPCT 1 06/10/2016    CMP:  Lab Results  Component Value Date   NA 136 06/10/2016   K 4.5 06/10/2016   CL 101 06/10/2016   CO2 22 06/10/2016   BUN 65 (H) 06/10/2016   CREATININE 12.54 (H) 06/10/2016   PROT 6.8 12/02/2015   ALBUMIN 2.9 (L) 12/03/2015   BILITOT 3.4 (H) 12/02/2015   ALKPHOS 99 12/02/2015   AST 100 (H) 12/02/2015   ALT 10 (L) 12/02/2015  .   Total Time in preparing paper work, data evaluation and todays exam - 35 minutes  Pearson GrippeJames Meara Wiechman M.D on 06/12/2016 at 8:01 AM  [facility]   Office  [phone]

## 2016-06-13 DIAGNOSIS — D696 Thrombocytopenia, unspecified: Secondary | ICD-10-CM | POA: Diagnosis not present

## 2016-06-13 DIAGNOSIS — D509 Iron deficiency anemia, unspecified: Secondary | ICD-10-CM | POA: Diagnosis not present

## 2016-06-13 DIAGNOSIS — D631 Anemia in chronic kidney disease: Secondary | ICD-10-CM | POA: Diagnosis not present

## 2016-06-13 DIAGNOSIS — N186 End stage renal disease: Secondary | ICD-10-CM | POA: Diagnosis not present

## 2016-06-13 DIAGNOSIS — N2581 Secondary hyperparathyroidism of renal origin: Secondary | ICD-10-CM | POA: Diagnosis not present

## 2016-06-13 LAB — HEPATITIS B SURFACE ANTIGEN: HEP B S AG: NEGATIVE

## 2016-06-15 DIAGNOSIS — D631 Anemia in chronic kidney disease: Secondary | ICD-10-CM | POA: Diagnosis not present

## 2016-06-15 DIAGNOSIS — Z992 Dependence on renal dialysis: Secondary | ICD-10-CM | POA: Diagnosis not present

## 2016-06-15 DIAGNOSIS — N186 End stage renal disease: Secondary | ICD-10-CM | POA: Diagnosis not present

## 2016-06-15 DIAGNOSIS — N2581 Secondary hyperparathyroidism of renal origin: Secondary | ICD-10-CM | POA: Diagnosis not present

## 2016-06-15 DIAGNOSIS — D509 Iron deficiency anemia, unspecified: Secondary | ICD-10-CM | POA: Diagnosis not present

## 2016-06-15 DIAGNOSIS — D696 Thrombocytopenia, unspecified: Secondary | ICD-10-CM | POA: Diagnosis not present

## 2016-06-20 DIAGNOSIS — N2581 Secondary hyperparathyroidism of renal origin: Secondary | ICD-10-CM | POA: Diagnosis not present

## 2016-06-20 DIAGNOSIS — L299 Pruritus, unspecified: Secondary | ICD-10-CM | POA: Diagnosis not present

## 2016-06-20 DIAGNOSIS — D696 Thrombocytopenia, unspecified: Secondary | ICD-10-CM | POA: Diagnosis not present

## 2016-06-20 DIAGNOSIS — Z23 Encounter for immunization: Secondary | ICD-10-CM | POA: Diagnosis not present

## 2016-06-20 DIAGNOSIS — D509 Iron deficiency anemia, unspecified: Secondary | ICD-10-CM | POA: Diagnosis not present

## 2016-06-20 DIAGNOSIS — N186 End stage renal disease: Secondary | ICD-10-CM | POA: Diagnosis not present

## 2016-06-20 DIAGNOSIS — I10 Essential (primary) hypertension: Secondary | ICD-10-CM | POA: Diagnosis not present

## 2016-06-24 DIAGNOSIS — L299 Pruritus, unspecified: Secondary | ICD-10-CM | POA: Diagnosis not present

## 2016-06-24 DIAGNOSIS — Z23 Encounter for immunization: Secondary | ICD-10-CM | POA: Diagnosis not present

## 2016-06-24 DIAGNOSIS — N2581 Secondary hyperparathyroidism of renal origin: Secondary | ICD-10-CM | POA: Diagnosis not present

## 2016-06-24 DIAGNOSIS — I10 Essential (primary) hypertension: Secondary | ICD-10-CM | POA: Diagnosis not present

## 2016-06-24 DIAGNOSIS — N186 End stage renal disease: Secondary | ICD-10-CM | POA: Diagnosis not present

## 2016-06-27 DIAGNOSIS — N2581 Secondary hyperparathyroidism of renal origin: Secondary | ICD-10-CM | POA: Diagnosis not present

## 2016-06-27 DIAGNOSIS — L299 Pruritus, unspecified: Secondary | ICD-10-CM | POA: Diagnosis not present

## 2016-06-27 DIAGNOSIS — I10 Essential (primary) hypertension: Secondary | ICD-10-CM | POA: Diagnosis not present

## 2016-06-27 DIAGNOSIS — N186 End stage renal disease: Secondary | ICD-10-CM | POA: Diagnosis not present

## 2016-06-27 DIAGNOSIS — Z23 Encounter for immunization: Secondary | ICD-10-CM | POA: Diagnosis not present

## 2016-07-01 DIAGNOSIS — L299 Pruritus, unspecified: Secondary | ICD-10-CM | POA: Diagnosis not present

## 2016-07-01 DIAGNOSIS — I10 Essential (primary) hypertension: Secondary | ICD-10-CM | POA: Diagnosis not present

## 2016-07-01 DIAGNOSIS — Z23 Encounter for immunization: Secondary | ICD-10-CM | POA: Diagnosis not present

## 2016-07-01 DIAGNOSIS — N2581 Secondary hyperparathyroidism of renal origin: Secondary | ICD-10-CM | POA: Diagnosis not present

## 2016-07-01 DIAGNOSIS — N186 End stage renal disease: Secondary | ICD-10-CM | POA: Diagnosis not present

## 2016-07-04 DIAGNOSIS — I10 Essential (primary) hypertension: Secondary | ICD-10-CM | POA: Diagnosis not present

## 2016-07-04 DIAGNOSIS — N2581 Secondary hyperparathyroidism of renal origin: Secondary | ICD-10-CM | POA: Diagnosis not present

## 2016-07-04 DIAGNOSIS — Z23 Encounter for immunization: Secondary | ICD-10-CM | POA: Diagnosis not present

## 2016-07-04 DIAGNOSIS — L299 Pruritus, unspecified: Secondary | ICD-10-CM | POA: Diagnosis not present

## 2016-07-04 DIAGNOSIS — N186 End stage renal disease: Secondary | ICD-10-CM | POA: Diagnosis not present

## 2016-07-06 DIAGNOSIS — N186 End stage renal disease: Secondary | ICD-10-CM | POA: Diagnosis not present

## 2016-07-06 DIAGNOSIS — N2581 Secondary hyperparathyroidism of renal origin: Secondary | ICD-10-CM | POA: Diagnosis not present

## 2016-07-06 DIAGNOSIS — I10 Essential (primary) hypertension: Secondary | ICD-10-CM | POA: Diagnosis not present

## 2016-07-06 DIAGNOSIS — L299 Pruritus, unspecified: Secondary | ICD-10-CM | POA: Diagnosis not present

## 2016-07-06 DIAGNOSIS — Z23 Encounter for immunization: Secondary | ICD-10-CM | POA: Diagnosis not present

## 2016-07-08 DIAGNOSIS — N2581 Secondary hyperparathyroidism of renal origin: Secondary | ICD-10-CM | POA: Diagnosis not present

## 2016-07-08 DIAGNOSIS — N186 End stage renal disease: Secondary | ICD-10-CM | POA: Diagnosis not present

## 2016-07-08 DIAGNOSIS — L299 Pruritus, unspecified: Secondary | ICD-10-CM | POA: Diagnosis not present

## 2016-07-08 DIAGNOSIS — I10 Essential (primary) hypertension: Secondary | ICD-10-CM | POA: Diagnosis not present

## 2016-07-08 DIAGNOSIS — Z23 Encounter for immunization: Secondary | ICD-10-CM | POA: Diagnosis not present

## 2016-07-11 DIAGNOSIS — L299 Pruritus, unspecified: Secondary | ICD-10-CM | POA: Diagnosis not present

## 2016-07-11 DIAGNOSIS — I10 Essential (primary) hypertension: Secondary | ICD-10-CM | POA: Diagnosis not present

## 2016-07-11 DIAGNOSIS — N186 End stage renal disease: Secondary | ICD-10-CM | POA: Diagnosis not present

## 2016-07-11 DIAGNOSIS — Z23 Encounter for immunization: Secondary | ICD-10-CM | POA: Diagnosis not present

## 2016-07-11 DIAGNOSIS — N2581 Secondary hyperparathyroidism of renal origin: Secondary | ICD-10-CM | POA: Diagnosis not present

## 2016-07-15 DIAGNOSIS — Z992 Dependence on renal dialysis: Secondary | ICD-10-CM | POA: Diagnosis not present

## 2016-07-15 DIAGNOSIS — N2581 Secondary hyperparathyroidism of renal origin: Secondary | ICD-10-CM | POA: Diagnosis not present

## 2016-07-15 DIAGNOSIS — L299 Pruritus, unspecified: Secondary | ICD-10-CM | POA: Diagnosis not present

## 2016-07-15 DIAGNOSIS — I10 Essential (primary) hypertension: Secondary | ICD-10-CM | POA: Diagnosis not present

## 2016-07-15 DIAGNOSIS — N186 End stage renal disease: Secondary | ICD-10-CM | POA: Diagnosis not present

## 2016-07-15 DIAGNOSIS — Z23 Encounter for immunization: Secondary | ICD-10-CM | POA: Diagnosis not present

## 2016-07-17 ENCOUNTER — Emergency Department (HOSPITAL_COMMUNITY): Payer: Medicare Other

## 2016-07-17 ENCOUNTER — Encounter (HOSPITAL_COMMUNITY): Payer: Self-pay | Admitting: Emergency Medicine

## 2016-07-17 ENCOUNTER — Inpatient Hospital Stay (HOSPITAL_COMMUNITY)
Admission: EM | Admit: 2016-07-17 | Discharge: 2016-07-23 | DRG: 974 | Disposition: A | Discharge status: Home / Self Care | Payer: Medicare Other | Attending: Internal Medicine | Admitting: Internal Medicine

## 2016-07-17 DIAGNOSIS — B20 Human immunodeficiency virus [HIV] disease: Secondary | ICD-10-CM | POA: Diagnosis not present

## 2016-07-17 DIAGNOSIS — I252 Old myocardial infarction: Secondary | ICD-10-CM

## 2016-07-17 DIAGNOSIS — G43909 Migraine, unspecified, not intractable, without status migrainosus: Secondary | ICD-10-CM | POA: Diagnosis present

## 2016-07-17 DIAGNOSIS — E8889 Other specified metabolic disorders: Secondary | ICD-10-CM | POA: Diagnosis present

## 2016-07-17 DIAGNOSIS — R17 Unspecified jaundice: Secondary | ICD-10-CM | POA: Diagnosis present

## 2016-07-17 DIAGNOSIS — R590 Localized enlarged lymph nodes: Secondary | ICD-10-CM | POA: Diagnosis present

## 2016-07-17 DIAGNOSIS — E871 Hypo-osmolality and hyponatremia: Secondary | ICD-10-CM | POA: Diagnosis present

## 2016-07-17 DIAGNOSIS — E43 Unspecified severe protein-calorie malnutrition: Secondary | ICD-10-CM | POA: Diagnosis not present

## 2016-07-17 DIAGNOSIS — I132 Hypertensive heart and chronic kidney disease with heart failure and with stage 5 chronic kidney disease, or end stage renal disease: Secondary | ICD-10-CM | POA: Diagnosis not present

## 2016-07-17 DIAGNOSIS — N186 End stage renal disease: Secondary | ICD-10-CM | POA: Diagnosis not present

## 2016-07-17 DIAGNOSIS — N481 Balanitis: Secondary | ICD-10-CM | POA: Diagnosis present

## 2016-07-17 DIAGNOSIS — N471 Phimosis: Secondary | ICD-10-CM | POA: Diagnosis present

## 2016-07-17 DIAGNOSIS — F17210 Nicotine dependence, cigarettes, uncomplicated: Secondary | ICD-10-CM | POA: Diagnosis present

## 2016-07-17 DIAGNOSIS — R51 Headache: Secondary | ICD-10-CM

## 2016-07-17 DIAGNOSIS — R011 Cardiac murmur, unspecified: Secondary | ICD-10-CM | POA: Diagnosis present

## 2016-07-17 DIAGNOSIS — R519 Headache, unspecified: Secondary | ICD-10-CM

## 2016-07-17 DIAGNOSIS — J45909 Unspecified asthma, uncomplicated: Secondary | ICD-10-CM | POA: Diagnosis present

## 2016-07-17 DIAGNOSIS — K649 Unspecified hemorrhoids: Secondary | ICD-10-CM | POA: Diagnosis present

## 2016-07-17 DIAGNOSIS — E876 Hypokalemia: Secondary | ICD-10-CM | POA: Diagnosis present

## 2016-07-17 DIAGNOSIS — Z6820 Body mass index (BMI) 20.0-20.9, adult: Secondary | ICD-10-CM

## 2016-07-17 DIAGNOSIS — R369 Urethral discharge, unspecified: Secondary | ICD-10-CM | POA: Diagnosis present

## 2016-07-17 DIAGNOSIS — Z79899 Other long term (current) drug therapy: Secondary | ICD-10-CM

## 2016-07-17 DIAGNOSIS — R05 Cough: Secondary | ICD-10-CM | POA: Diagnosis not present

## 2016-07-17 DIAGNOSIS — A419 Sepsis, unspecified organism: Principal | ICD-10-CM | POA: Diagnosis present

## 2016-07-17 DIAGNOSIS — I428 Other cardiomyopathies: Secondary | ICD-10-CM | POA: Diagnosis not present

## 2016-07-17 DIAGNOSIS — Z992 Dependence on renal dialysis: Secondary | ICD-10-CM

## 2016-07-17 DIAGNOSIS — I5022 Chronic systolic (congestive) heart failure: Secondary | ICD-10-CM | POA: Diagnosis present

## 2016-07-17 DIAGNOSIS — D539 Nutritional anemia, unspecified: Secondary | ICD-10-CM | POA: Diagnosis present

## 2016-07-17 LAB — CBC WITH DIFFERENTIAL/PLATELET
BASOS PCT: 0 %
Basophils Absolute: 0 10*3/uL (ref 0.0–0.1)
Eosinophils Absolute: 0.1 10*3/uL (ref 0.0–0.7)
Eosinophils Relative: 1 %
HEMATOCRIT: 33.8 % — AB (ref 39.0–52.0)
HEMOGLOBIN: 12.1 g/dL — AB (ref 13.0–17.0)
LYMPHS ABS: 0.3 10*3/uL — AB (ref 0.7–4.0)
Lymphocytes Relative: 4 %
MCH: 37.2 pg — AB (ref 26.0–34.0)
MCHC: 35.8 g/dL (ref 30.0–36.0)
MCV: 104 fL — ABNORMAL HIGH (ref 78.0–100.0)
MONOS PCT: 1 %
Monocytes Absolute: 0.1 10*3/uL (ref 0.1–1.0)
NEUTROS ABS: 7.2 10*3/uL (ref 1.7–7.7)
NEUTROS PCT: 94 %
Platelets: 199 10*3/uL (ref 150–400)
RBC: 3.25 MIL/uL — ABNORMAL LOW (ref 4.22–5.81)
RDW: 16.2 % — ABNORMAL HIGH (ref 11.5–15.5)
WBC: 7.6 10*3/uL (ref 4.0–10.5)

## 2016-07-17 LAB — COMPREHENSIVE METABOLIC PANEL
ALBUMIN: 2.3 g/dL — AB (ref 3.5–5.0)
ALK PHOS: 125 U/L (ref 38–126)
ALT: 13 U/L — AB (ref 17–63)
ANION GAP: 13 (ref 5–15)
AST: 32 U/L (ref 15–41)
BILIRUBIN TOTAL: 7.7 mg/dL — AB (ref 0.3–1.2)
BUN: 45 mg/dL — ABNORMAL HIGH (ref 6–20)
CALCIUM: 8.4 mg/dL — AB (ref 8.9–10.3)
CO2: 26 mmol/L (ref 22–32)
CREATININE: 9.86 mg/dL — AB (ref 0.61–1.24)
Chloride: 95 mmol/L — ABNORMAL LOW (ref 101–111)
GFR calc Af Amer: 7 mL/min — ABNORMAL LOW (ref >60)
GFR calc non Af Amer: 6 mL/min — ABNORMAL LOW (ref >60)
GLUCOSE: 102 mg/dL — AB (ref 65–99)
Potassium: 3.1 mmol/L — ABNORMAL LOW (ref 3.5–5.1)
Sodium: 134 mmol/L — ABNORMAL LOW (ref 135–145)
TOTAL PROTEIN: 7.5 g/dL (ref 6.5–8.1)

## 2016-07-17 LAB — I-STAT CG4 LACTIC ACID, ED: Lactic Acid, Venous: 3.77 mmol/L (ref 0.5–1.9)

## 2016-07-17 LAB — LACTIC ACID, PLASMA: Lactic Acid, Venous: 3.3 mmol/L (ref 0.5–1.9)

## 2016-07-17 LAB — LIPASE, BLOOD: Lipase: 20 U/L (ref 11–51)

## 2016-07-17 MED ORDER — SODIUM CHLORIDE 0.9 % IV BOLUS (SEPSIS)
1000.0000 mL | Freq: Once | INTRAVENOUS | Status: AC
  Administered 2016-07-17: 1000 mL via INTRAVENOUS

## 2016-07-17 MED ORDER — PIPERACILLIN-TAZOBACTAM 3.375 G IVPB 30 MIN
3.3750 g | Freq: Once | INTRAVENOUS | Status: AC
  Administered 2016-07-17: 3.375 g via INTRAVENOUS
  Filled 2016-07-17: qty 50

## 2016-07-17 MED ORDER — FENTANYL CITRATE (PF) 100 MCG/2ML IJ SOLN
50.0000 ug | Freq: Once | INTRAMUSCULAR | Status: AC
  Administered 2016-07-17: 50 ug via INTRAVENOUS
  Filled 2016-07-17: qty 2

## 2016-07-17 MED ORDER — VANCOMYCIN HCL IN DEXTROSE 1-5 GM/200ML-% IV SOLN
1000.0000 mg | Freq: Once | INTRAVENOUS | Status: AC
  Administered 2016-07-17: 1000 mg via INTRAVENOUS
  Filled 2016-07-17: qty 200

## 2016-07-17 MED ORDER — ACETAMINOPHEN 325 MG PO TABS
650.0000 mg | ORAL_TABLET | Freq: Once | ORAL | Status: AC
  Administered 2016-07-17: 650 mg via ORAL
  Filled 2016-07-17: qty 2

## 2016-07-17 NOTE — ED Triage Notes (Signed)
Pt reports migraine that started today, chills and bleeding from hemrhoids.

## 2016-07-17 NOTE — ED Provider Notes (Signed)
[facility]-EMERGENCY DEPT Provider Note   CSN: [id number] Arrival date & time: 07/17/16  2156   By signing my name below, I, Vista Mink, attest that this documentation has been prepared under the direction and in the presence of Briscoe Deutscher, MD. Electronically signed, Vista Mink, ED Scribe. 07/17/16. 10:39 PM.   History   Chief Complaint Chief Complaint  Patient presents with  . Migraine    HPI HPI Comments: Dave Estrada is a 25 y.o. Male with a Hx of HIV/AIDS, ESRD dialysis patient, who presents to the Emergency Department complaining of a constant frontal headache, fever and chills with associated productive cough that started two days ago. Pt states that he woke up yesterday with these symptoms. Pt's fever started today when he left from school, currently 101.2 on arrival. He also reports some rectal bleeding this morning that he believes is from his hemorrhoids . He states that his headache is throbbing and "i felt like I was going to black out on the way over here". Pt's last dialysis was two days ago. Pt with AIDS and unsure of last viral count, although, he believes it has been low. No rashes.  The history is provided by the patient. No language interpreter was used.    Past Medical History:  Diagnosis Date  . A-V fistula (HCC)    Placed on 10/27/2014 at [facility]; for future HD use.   . Asthma   . ESRD (end stage renal disease) (HCC)   . Heart attack   . HIV (human immunodeficiency virus infection) (HCC)   . Staphylococcus aureus bacteremia with sepsis (HCC) 09/09/2014  . Systolic dysfunction, left ventricle 12/20/2014   EF 20%     Patient Active Problem List   Diagnosis Date Noted  . Sepsis, unspecified organism (HCC) 07/18/2016  . ESRD needing dialysis (HCC) 06/11/2016  . Dyspnea 06/11/2016  . Chronic systolic heart failure (HCC) 01/13/2016  . Nonischemic cardiomyopathy (HCC) 11/27/2015  . Hemoptysis 09/11/2015  . Cough 09/11/2015  . SOB (shortness of breath)  09/11/2015  . Acute respiratory failure (HCC) 09/11/2015  . Elevated troponin 09/11/2015  . Protein-calorie malnutrition, severe (HCC) 09/11/2015  . Pneumonia 09/11/2015  . Community acquired pneumonia   . Acute on chronic systolic CHF (congestive heart failure) (HCC) 12/20/2014  . A-V fistula (HCC) 12/18/2014  . Thrombocytopenia, acquired (HCC) 12/18/2014  . Other infectious complication of vascular dialysis catheter 12/17/2014  . 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 Pyrexia   . Vomiting and diarrhea   . AIDS (HCC)   . Macrocytic anemia 09/10/2014  . Cigarette smoker 09/10/2014  . Liver lesion 09/10/2014  . Asthma, chronic 09/10/2014  . History of MRSA infection 09/10/2014  . MRSA bacteremia 09/10/2014  . Staphylococcus aureus bacteremia with sepsis (HCC) 09/09/2014  . HIV (human immunodeficiency virus infection) (HCC) 09/08/2014  . ESRD on hemodialysis (HCC) 09/08/2014    Past Surgical History:  Procedure Laterality Date  . INSERTION OF DIALYSIS CATHETER Left 09/15/2014   Procedure: INSERTION OF DIALYSIS CATHETER;  Surgeon: Nada Libman, MD;  Location: [facility] OR;  Service: Vascular;  Laterality: Left;  . PORT A CATH REVISION    . TEE WITHOUT CARDIOVERSION N/A 09/14/2014   Procedure: TRANSESOPHAGEAL ECHOCARDIOGRAM (TEE);  Surgeon: Laurey Morale, MD;  Location: [facility] ENDOSCOPY;  Service: Cardiovascular;  Laterality: N/A;  will be at [facility] by mon     Home Medications    Prior to Admission medications   Medication Sig Start Date End  Date Taking? Authorizing Provider  albuterol (PROVENTIL HFA;VENTOLIN HFA) 108 (90 BASE) MCG/ACT inhaler Inhale 1-2 puffs into the lungs every 6 (six) hours as needed for wheezing or shortness of breath.   Yes Historical Provider, MD  emtricitabine (EMTRIVA) 200 MG capsule Take 200 mg by mouth 2 (two) times a week. Taken after dialysis on Tuesday and Thursday   Yes Historical Provider, MD  etravirine (INTELENCE) 100 MG tablet  Take 200 mg by mouth every 12 (twelve) hours.   Yes Historical Provider, MD  metoprolol succinate (TOPROL-XL) 50 MG 24 hr tablet Take one and one half tab twice a day Take with or immediately following a meal. Patient taking differently: Take 50 mg by mouth daily.  01/12/16  Yes Bevelyn Buckles Bensimhon, MD  raltegravir (ISENTRESS) 400 MG tablet Take 400 mg by mouth 2 (two) times daily.   Yes Historical Provider, MD  sulfamethoxazole-trimethoprim (BACTRIM,SEPTRA) 400-80 MG tablet Take 1 tablet by mouth 3 (three) times a week. To take after dialysis days   Yes Historical Provider, MD  tenofovir (VIREAD) 300 MG tablet Take 300 mg by mouth once a week. To be taken on Tuesday after dialysis   Yes Historical Provider, MD    Family History Family History  Problem Relation Age of Onset  . Seizures Father     Social History Social History  Substance Use Topics  . Smoking status: Current Every Day Smoker    Packs/day: 0.50    Years: 11.00    Types: Cigarettes    Start date: 01/28/2003  . Smokeless tobacco: Never Used  . Alcohol use No    Allergies   Bee venom; Other; and Aspirin   Review of Systems Review of Systems  Constitutional: Positive for chills and fever.  Respiratory: Positive for cough.   Gastrointestinal: Positive for blood in stool.  Skin: Negative for rash.  Neurological: Positive for headaches.  All other systems reviewed and are negative.    Physical Exam Updated Vital Signs BP (!) 90/50   Pulse 120   Temp 101.2 F (38.4 C) (Oral)   Resp 16   Ht 5\' 5"  (1.651 m)   Wt 120 lb (54.4 kg)   SpO2 93%   BMI 19.97 kg/m   Physical Exam  Constitutional: He is oriented to person, place, and time. He appears well-developed and well-nourished. No distress.  HENT:  Head: Normocephalic and atraumatic.  Mucosal edema in nose with rhinnorhea. Front and maxillary sinus tenderness. Posterior oropharyngeal edema  Neck: Normal range of motion.  Cardiovascular: Regular rhythm.   Exam reveals no gallop and no friction rub.   No murmur heard. Tachycardic  Pulmonary/Chest: Effort normal. He has wheezes. He has rales.  Crackles, worse on right side. Wheezing through all lung fields.  Neurological: He is alert and oriented to person, place, and time.  Skin: Skin is dry. No rash noted. He is not diaphoretic.  Skin is hot and dry  Psychiatric: He has a normal mood and affect. Judgment normal.  Nursing note and vitals reviewed.    ED Treatments / Results  DIAGNOSTIC STUDIES: Oxygen Saturation is 99% on RA, normal by my interpretation.  COORDINATION OF CARE: 10:15 PM-Will order labs and imaging. Discussed treatment plan with pt at bedside and pt agreed to plan.   Labs (all labs ordered are listed, but only abnormal results are displayed) Labs Reviewed  CBC WITH DIFFERENTIAL/PLATELET - Abnormal; Notable for the following:       Result Value   RBC 3.25 (*)  Hemoglobin 12.1 (*)    HCT 33.8 (*)    MCV 104.0 (*)    MCH 37.2 (*)    RDW 16.2 (*)    Lymphs Abs 0.3 (*)    All other components within normal limits  COMPREHENSIVE METABOLIC PANEL - Abnormal; Notable for the following:    Sodium 134 (*)    Potassium 3.1 (*)    Chloride 95 (*)    Glucose, Bld 102 (*)    BUN 45 (*)    Creatinine, Ser 9.86 (*)    Calcium 8.4 (*)    Albumin 2.3 (*)    ALT 13 (*)    Total Bilirubin 7.7 (*)    GFR calc non Af Amer 6 (*)    GFR calc Af Amer 7 (*)    All other components within normal limits  LACTIC ACID, PLASMA - Abnormal; Notable for the following:    Lactic Acid, Venous 3.3 (*)    All other components within normal limits  I-STAT CG4 LACTIC ACID, ED - Abnormal; Notable for the following:    Lactic Acid, Venous 3.77 (*)    All other components within normal limits  CULTURE, BLOOD (ROUTINE X 2)  CULTURE, BLOOD (ROUTINE X 2)  URINE CULTURE  CSF CULTURE  GRAM STAIN  HSV CULTURE AND TYPING  LIPASE, BLOOD  LACTIC ACID, PLASMA  URINALYSIS, ROUTINE W REFLEX  MICROSCOPIC (NOT AT [facility])  INFLUENZA PANEL BY PCR (TYPE A & B, H1N1)  CSF CELL COUNT WITH DIFFERENTIAL  CSF CELL COUNT WITH DIFFERENTIAL  GLUCOSE, CSF  PROTEIN, CSF  HERPES SIMPLEX VIRUS(HSV) DNA BY PCR  CRYPTOCOCCAL ANTIGEN, CSF  I-STAT CG4 LACTIC ACID, ED    EKG  EKG Interpretation  Date/Time:  Monday July 17 2016 22:24:04 EDT Ventricular Rate:  127 PR Interval:    QRS Duration: 85 QT Interval:  310 QTC Calculation: 451 R Axis:   94 Text Interpretation:  Sinus tachycardia Probable left atrial enlargement Borderline right axis deviation Borderline repolarization abnormality Confirmed by [facility] Hospital For Cancer [facility] Allied DiseasesMESNER MD, Barbara CowerJASON [phone]([id number]) on 07/17/2016 10:38:59 PM       Radiology Dg Chest 2 View  Result Date: 07/17/2016 CLINICAL DATA:  Worsening cough and fever for 1 week. History of smoker, HIV, end-stage renal disease on dialysis, asthma. EXAM: CHEST  2 VIEW COMPARISON:  06/10/2016 FINDINGS: Slightly shallow inspiration. Normal heart size and pulmonary vascularity. No focal airspace disease or consolidation in the lungs. No blunting of costophrenic angles. No pneumothorax. Mediastinal contours appear intact. IMPRESSION: No active cardiopulmonary disease. Electronically Signed   By: Burman NievesWilliam  Stevens M.D.   On: 07/17/2016 23:25    Procedures  CRITICAL CARE Performed by: Marily MemosMesner, Cas Tracz Total critical care time: 35 minutes Critical care time was exclusive of separately billable procedures and treating other patients. Critical care was necessary to treat or prevent imminent or life-threatening deterioration. Critical care was time spent personally by me on the following activities: development of treatment plan with patient and/or surrogate as well as nursing, discussions with consultants, evaluation of patient's response to treatment, examination of patient, obtaining history from patient or surrogate, ordering and performing treatments and interventions, ordering and review of laboratory studies,  ordering and review of radiographic studies, pulse oximetry and re-evaluation of patient's condition.   .Lumbar Puncture Date/Time: 07/18/2016 12:58 AM Performed by: Marily MemosMESNER, Opal Dinning Authorized by: Marily MemosMESNER, Mitcheal Sweetin   Consent:    Consent obtained:  Verbal   Consent given by:  Patient   Risks discussed:  Bleeding, headache, infection and pain  Alternatives discussed:  No treatment Pre-procedure details:    Procedure purpose:  Diagnostic   Preparation: Patient was prepped and draped in usual sterile fashion   Anesthesia (see MAR for exact dosages):    Anesthesia method:  Local infiltration   Local anesthetic:  Lidocaine 1% w/o epi Procedure details:    Lumbar space:  L3-L4 interspace   Patient position:  R lateral decubitus   Needle gauge:  22   Needle type:  Spinal needle - Quincke tip   Needle length (in):  3.5   Ultrasound guidance: no     Number of attempts:  2   Fluid appearance:  Blood-tinged then clearing   Tubes of fluid:  4   Total volume (ml):  5 Post-procedure:    Puncture site:  Adhesive bandage applied   Patient tolerance of procedure:  Tolerated well, no immediate complications   (including critical care time)  Medications Ordered in ED Medications  sodium chloride 0.9 % bolus 1,000 mL (0 mLs Intravenous Stopped 07/17/16 2344)  acetaminophen (TYLENOL) tablet 650 mg (650 mg Oral Given 07/17/16 2255)  vancomycin (VANCOCIN) IVPB 1000 mg/200 mL premix (0 mg Intravenous Stopped 07/18/16 0049)  piperacillin-tazobactam (ZOSYN) IVPB 3.375 g (0 g Intravenous Stopped 07/17/16 2344)  fentaNYL (SUBLIMAZE) injection 50 mcg (50 mcg Intravenous Given 07/17/16 2327)  sodium chloride 0.9 % bolus 1,000 mL (1,000 mLs Intravenous New Bag/Given 07/18/16 0057)     Initial Impression / Assessment and Plan / ED Course  I have reviewed the triage vital signs and the nursing notes.  Pertinent labs & imaging results that were available during my care of the patient were reviewed by me and  considered in my medical decision making (see chart for details).  Clinical Course    Sepsis likely secondary to pneumonia versus influenza. However had a severe headache and chest x-ray clear so lumbar puncture done to evaluate for meningitis. Already covered for sepsis, healthcare associated, using vancomycin and Zosyn. Also has a indurated area on his left gluteal area of fluid draining next to it but less likely to be the source of his infection.  Final Clinical Impressions(s) / ED Diagnoses   Final diagnoses:  Sepsis, due to unspecified organism (HCC)  Nonintractable headache, unspecified chronicity pattern, unspecified headache type    New Prescriptions New Prescriptions   No medications on file  I personally performed the services described in this documentation, which was scribed in my presence. The recorded information has been reviewed and is accurate.    Marily Memos, MD 07/18/16 [phone]

## 2016-07-17 NOTE — ED Notes (Signed)
CRITICAL VALUE ALERT  Critical value received:  I stat lactic 3.77  Date of notification:  07/17/16  Time of notification:  2253  Critical value read back:Yes.    Nurse who received alert:  Thornton Dales, RN  MD notified (1st page):  Mesner  Time of first page:  2253  MD notified (2nd page):  Time of second page:  Responding MD:  mesner  Time MD responded:  2253

## 2016-07-18 ENCOUNTER — Inpatient Hospital Stay (HOSPITAL_COMMUNITY): Payer: Medicare Other

## 2016-07-18 ENCOUNTER — Encounter (HOSPITAL_COMMUNITY): Payer: Self-pay | Admitting: Family Medicine

## 2016-07-18 DIAGNOSIS — E8889 Other specified metabolic disorders: Secondary | ICD-10-CM | POA: Diagnosis present

## 2016-07-18 DIAGNOSIS — D539 Nutritional anemia, unspecified: Secondary | ICD-10-CM

## 2016-07-18 DIAGNOSIS — A419 Sepsis, unspecified organism: Secondary | ICD-10-CM | POA: Diagnosis not present

## 2016-07-18 DIAGNOSIS — N471 Phimosis: Secondary | ICD-10-CM | POA: Diagnosis not present

## 2016-07-18 DIAGNOSIS — N186 End stage renal disease: Secondary | ICD-10-CM | POA: Diagnosis not present

## 2016-07-18 DIAGNOSIS — I428 Other cardiomyopathies: Secondary | ICD-10-CM | POA: Diagnosis present

## 2016-07-18 DIAGNOSIS — N4889 Other specified disorders of penis: Secondary | ICD-10-CM | POA: Diagnosis not present

## 2016-07-18 DIAGNOSIS — R369 Urethral discharge, unspecified: Secondary | ICD-10-CM | POA: Diagnosis present

## 2016-07-18 DIAGNOSIS — D631 Anemia in chronic kidney disease: Secondary | ICD-10-CM | POA: Diagnosis not present

## 2016-07-18 DIAGNOSIS — I5022 Chronic systolic (congestive) heart failure: Secondary | ICD-10-CM

## 2016-07-18 DIAGNOSIS — F17210 Nicotine dependence, cigarettes, uncomplicated: Secondary | ICD-10-CM | POA: Diagnosis present

## 2016-07-18 DIAGNOSIS — N481 Balanitis: Secondary | ICD-10-CM | POA: Diagnosis not present

## 2016-07-18 DIAGNOSIS — E871 Hypo-osmolality and hyponatremia: Secondary | ICD-10-CM | POA: Diagnosis present

## 2016-07-18 DIAGNOSIS — I252 Old myocardial infarction: Secondary | ICD-10-CM | POA: Diagnosis not present

## 2016-07-18 DIAGNOSIS — Z992 Dependence on renal dialysis: Secondary | ICD-10-CM | POA: Diagnosis not present

## 2016-07-18 DIAGNOSIS — R17 Unspecified jaundice: Secondary | ICD-10-CM | POA: Diagnosis present

## 2016-07-18 DIAGNOSIS — R011 Cardiac murmur, unspecified: Secondary | ICD-10-CM | POA: Diagnosis present

## 2016-07-18 DIAGNOSIS — Z79899 Other long term (current) drug therapy: Secondary | ICD-10-CM | POA: Diagnosis not present

## 2016-07-18 DIAGNOSIS — Z6820 Body mass index (BMI) 20.0-20.9, adult: Secondary | ICD-10-CM | POA: Diagnosis not present

## 2016-07-18 DIAGNOSIS — R590 Localized enlarged lymph nodes: Secondary | ICD-10-CM | POA: Diagnosis present

## 2016-07-18 DIAGNOSIS — G43909 Migraine, unspecified, not intractable, without status migrainosus: Secondary | ICD-10-CM | POA: Diagnosis present

## 2016-07-18 DIAGNOSIS — B20 Human immunodeficiency virus [HIV] disease: Secondary | ICD-10-CM | POA: Diagnosis not present

## 2016-07-18 DIAGNOSIS — J45909 Unspecified asthma, uncomplicated: Secondary | ICD-10-CM | POA: Diagnosis present

## 2016-07-18 DIAGNOSIS — I132 Hypertensive heart and chronic kidney disease with heart failure and with stage 5 chronic kidney disease, or end stage renal disease: Secondary | ICD-10-CM | POA: Diagnosis present

## 2016-07-18 DIAGNOSIS — E43 Unspecified severe protein-calorie malnutrition: Secondary | ICD-10-CM | POA: Diagnosis present

## 2016-07-18 DIAGNOSIS — E876 Hypokalemia: Secondary | ICD-10-CM | POA: Diagnosis present

## 2016-07-18 DIAGNOSIS — K828 Other specified diseases of gallbladder: Secondary | ICD-10-CM | POA: Diagnosis not present

## 2016-07-18 LAB — URINALYSIS, ROUTINE W REFLEX MICROSCOPIC
GLUCOSE, UA: 100 mg/dL — AB
Ketones, ur: NEGATIVE mg/dL
Nitrite: NEGATIVE
PH: 7.5 (ref 5.0–8.0)
Protein, ur: >300 mg/dL — AB
SPECIFIC GRAVITY, URINE: 1.015 (ref 1.005–1.030)

## 2016-07-18 LAB — COMPREHENSIVE METABOLIC PANEL
ALBUMIN: 2.1 g/dL — AB (ref 3.5–5.0)
ALK PHOS: 108 U/L (ref 38–126)
ALT: 11 U/L — AB (ref 17–63)
AST: 29 U/L (ref 15–41)
Anion gap: 11 (ref 5–15)
BILIRUBIN TOTAL: 8.7 mg/dL — AB (ref 0.3–1.2)
BUN: 44 mg/dL — ABNORMAL HIGH (ref 6–20)
CALCIUM: 7.7 mg/dL — AB (ref 8.9–10.3)
CO2: 23 mmol/L (ref 22–32)
CREATININE: 9.45 mg/dL — AB (ref 0.61–1.24)
Chloride: 99 mmol/L — ABNORMAL LOW (ref 101–111)
GFR calc non Af Amer: 7 mL/min — ABNORMAL LOW (ref >60)
GFR, EST AFRICAN AMERICAN: 8 mL/min — AB (ref >60)
GLUCOSE: 74 mg/dL (ref 65–99)
Potassium: 3.4 mmol/L — ABNORMAL LOW (ref 3.5–5.1)
SODIUM: 133 mmol/L — AB (ref 135–145)
Total Protein: 7 g/dL (ref 6.5–8.1)

## 2016-07-18 LAB — CSF CELL COUNT WITH DIFFERENTIAL
RBC Count, CSF: 412 /mm3 — ABNORMAL HIGH
RBC Count, CSF: 44 /mm3 — ABNORMAL HIGH
TUBE #: 1
TUBE #: 4
WBC CSF: 0 /mm3 (ref 0–5)
WBC, CSF: 0 /mm3 (ref 0–5)

## 2016-07-18 LAB — LACTIC ACID, PLASMA
LACTIC ACID, VENOUS: 2.3 mmol/L — AB (ref 0.5–1.9)
Lactic Acid, Venous: 2.2 mmol/L (ref 0.5–1.9)

## 2016-07-18 LAB — GLUCOSE, CAPILLARY: Glucose-Capillary: 91 mg/dL (ref 65–99)

## 2016-07-18 LAB — INFLUENZA PANEL BY PCR (TYPE A & B)
H1N1FLUPCR: NOT DETECTED
Influenza A By PCR: NEGATIVE
Influenza B By PCR: NEGATIVE

## 2016-07-18 LAB — CBC
HEMATOCRIT: 35.7 % — AB (ref 39.0–52.0)
HEMOGLOBIN: 12.3 g/dL — AB (ref 13.0–17.0)
MCH: 35.1 pg — AB (ref 26.0–34.0)
MCHC: 34.5 g/dL (ref 30.0–36.0)
MCV: 102 fL — AB (ref 78.0–100.0)
Platelets: 165 10*3/uL (ref 150–400)
RBC: 3.5 MIL/uL — ABNORMAL LOW (ref 4.22–5.81)
RDW: 16.4 % — ABNORMAL HIGH (ref 11.5–15.5)
WBC: 13.7 10*3/uL — ABNORMAL HIGH (ref 4.0–10.5)

## 2016-07-18 LAB — PROTEIN, CSF: Total  Protein, CSF: 20 mg/dL (ref 15–45)

## 2016-07-18 LAB — BILIRUBIN, FRACTIONATED(TOT/DIR/INDIR)
BILIRUBIN DIRECT: 5.2 mg/dL — AB (ref 0.1–0.5)
BILIRUBIN INDIRECT: 3.3 mg/dL — AB (ref 0.3–0.9)
BILIRUBIN TOTAL: 8.5 mg/dL — AB (ref 0.3–1.2)

## 2016-07-18 LAB — MRSA PCR SCREENING: MRSA by PCR: NEGATIVE

## 2016-07-18 LAB — CRYPTOCOCCAL ANTIGEN, CSF: CRYPTO AG: NEGATIVE

## 2016-07-18 LAB — PROCALCITONIN: Procalcitonin: 18.4 ng/mL

## 2016-07-18 LAB — URINE MICROSCOPIC-ADD ON

## 2016-07-18 LAB — GLUCOSE, CSF: Glucose, CSF: 63 mg/dL (ref 40–70)

## 2016-07-18 MED ORDER — RALTEGRAVIR POTASSIUM 400 MG PO TABS
400.0000 mg | ORAL_TABLET | Freq: Two times a day (BID) | ORAL | Status: DC
  Administered 2016-07-18 (×2): 400 mg via ORAL
  Filled 2016-07-18 (×4): qty 1

## 2016-07-18 MED ORDER — SODIUM CHLORIDE 0.9% FLUSH
3.0000 mL | Freq: Two times a day (BID) | INTRAVENOUS | Status: DC
Start: 2016-07-18 — End: 2016-07-23
  Administered 2016-07-18 – 2016-07-20 (×7): 3 mL via INTRAVENOUS

## 2016-07-18 MED ORDER — SODIUM CHLORIDE 0.9% FLUSH
3.0000 mL | Freq: Two times a day (BID) | INTRAVENOUS | Status: DC
  Administered 2016-07-18 – 2016-07-22 (×6): 3 mL via INTRAVENOUS

## 2016-07-18 MED ORDER — PIPERACILLIN-TAZOBACTAM 3.375 G IVPB
3.3750 g | Freq: Two times a day (BID) | INTRAVENOUS | Status: DC
  Administered 2016-07-18 – 2016-07-21 (×8): 3.375 g via INTRAVENOUS
  Filled 2016-07-18 (×7): qty 50

## 2016-07-18 MED ORDER — LIDOCAINE-PRILOCAINE 2.5-2.5 % EX CREA: 1.0000 "application " | TOPICAL_CREAM | CUTANEOUS | Status: DC | PRN

## 2016-07-18 MED ORDER — SODIUM CHLORIDE 0.9 % IV SOLN: 250.0000 mL | INTRAVENOUS | Status: DC | PRN

## 2016-07-18 MED ORDER — EMTRICITABINE 200 MG PO CAPS: 200.0000 mg | ORAL_CAPSULE | ORAL | Status: DC

## 2016-07-18 MED ORDER — PENTAFLUOROPROP-TETRAFLUOROETH EX AERO
1.0000 | INHALATION_SPRAY | CUTANEOUS | Status: DC | PRN
Start: 2016-07-18 — End: 2016-07-23

## 2016-07-18 MED ORDER — HYDROCODONE-ACETAMINOPHEN 5-325 MG PO TABS
1.0000 | ORAL_TABLET | ORAL | Status: DC | PRN
Start: 2016-07-18 — End: 2016-07-23
  Administered 2016-07-18: 1 via ORAL
  Administered 2016-07-18 – 2016-07-20 (×6): 2 via ORAL
  Administered 2016-07-21: 1 via ORAL
  Filled 2016-07-18 (×5): qty 2
  Filled 2016-07-18: qty 1
  Filled 2016-07-18 (×2): qty 2

## 2016-07-18 MED ORDER — ETRAVIRINE 100 MG PO TABS
200.0000 mg | ORAL_TABLET | Freq: Two times a day (BID) | ORAL | Status: DC
  Administered 2016-07-19 – 2016-07-21 (×5): 200 mg via ORAL
  Filled 2016-07-18 (×11): qty 2

## 2016-07-18 MED ORDER — TENOFOVIR DISOPROXIL FUMARATE 300 MG PO TABS
300.0000 mg | ORAL_TABLET | ORAL | Status: DC
  Filled 2016-07-18: qty 1

## 2016-07-18 MED ORDER — SODIUM CHLORIDE 0.9% FLUSH
3.0000 mL | INTRAVENOUS | Status: DC | PRN
  Administered 2016-07-21: 3 mL via INTRAVENOUS
  Filled 2016-07-18: qty 3

## 2016-07-18 MED ORDER — ONDANSETRON HCL 4 MG/2ML IJ SOLN: 4.0000 mg | Freq: Four times a day (QID) | INTRAMUSCULAR | Status: DC | PRN

## 2016-07-18 MED ORDER — ACETAMINOPHEN 325 MG PO TABS: 650.0000 mg | ORAL_TABLET | Freq: Four times a day (QID) | ORAL | Status: DC | PRN

## 2016-07-18 MED ORDER — ETRAVIRINE 100 MG PO TABS
200.0000 mg | ORAL_TABLET | Freq: Two times a day (BID) | ORAL | Status: DC
  Administered 2016-07-18 (×2): 200 mg via ORAL
  Filled 2016-07-18 (×4): qty 2

## 2016-07-18 MED ORDER — SODIUM CHLORIDE 0.9 % IV BOLUS (SEPSIS)
1000.0000 mL | Freq: Once | INTRAVENOUS | Status: AC
  Administered 2016-07-18: 1000 mL via INTRAVENOUS

## 2016-07-18 MED ORDER — SODIUM CHLORIDE 0.9 % IV SOLN
100.0000 mL | INTRAVENOUS | Status: DC | PRN
  Administered 2016-07-19: 250 mL via INTRAVENOUS

## 2016-07-18 MED ORDER — SULFAMETHOXAZOLE-TRIMETHOPRIM 800-160 MG PO TABS
0.5000 | ORAL_TABLET | ORAL | Status: DC
  Administered 2016-07-19 – 2016-07-21 (×2): 0.5 via ORAL
  Filled 2016-07-18 (×2): qty 1

## 2016-07-18 MED ORDER — RALTEGRAVIR POTASSIUM 400 MG PO TABS
400.0000 mg | ORAL_TABLET | Freq: Two times a day (BID) | ORAL | Status: DC
  Administered 2016-07-19 – 2016-07-21 (×5): 400 mg via ORAL
  Filled 2016-07-18 (×11): qty 1

## 2016-07-18 MED ORDER — METOPROLOL SUCCINATE ER 50 MG PO TB24
50.0000 mg | ORAL_TABLET | Freq: Every day | ORAL | Status: DC
  Administered 2016-07-21: 50 mg via ORAL
  Filled 2016-07-18 (×2): qty 1

## 2016-07-18 MED ORDER — LIDOCAINE HCL (PF) 1 % IJ SOLN: 5.0000 mL | INTRAMUSCULAR | Status: DC | PRN

## 2016-07-18 MED ORDER — VANCOMYCIN HCL 500 MG IV SOLR
500.0000 mg | INTRAVENOUS | Status: DC
  Administered 2016-07-18: 500 mg via INTRAVENOUS
  Filled 2016-07-18 (×2): qty 500

## 2016-07-18 MED ORDER — SODIUM CHLORIDE 0.9 % IV SOLN
100.0000 mL | INTRAVENOUS | Status: DC | PRN
  Administered 2016-07-20: 100 mL via INTRAVENOUS

## 2016-07-18 MED ORDER — WITCH HAZEL-GLYCERIN EX PADS: MEDICATED_PAD | CUTANEOUS | Status: DC | PRN

## 2016-07-18 MED ORDER — SULFAMETHOXAZOLE-TRIMETHOPRIM 400-80 MG PO TABS
1.0000 | ORAL_TABLET | ORAL | Status: DC
  Filled 2016-07-18: qty 1

## 2016-07-18 MED ORDER — ALBUTEROL SULFATE (2.5 MG/3ML) 0.083% IN NEBU: 3.0000 mL | INHALATION_SOLUTION | RESPIRATORY_TRACT | Status: DC | PRN

## 2016-07-18 MED ORDER — TENOFOVIR DISOPROXIL FUMARATE 300 MG PO TABS
300.0000 mg | ORAL_TABLET | ORAL | Status: DC
  Administered 2016-07-18: 300 mg via ORAL
  Filled 2016-07-18: qty 1

## 2016-07-18 MED ORDER — ACETAMINOPHEN 650 MG RE SUPP: 650.0000 mg | Freq: Four times a day (QID) | RECTAL | Status: DC | PRN

## 2016-07-18 MED ORDER — EMTRICITABINE 200 MG PO CAPS
200.0000 mg | ORAL_CAPSULE | ORAL | Status: DC
  Administered 2016-07-18 – 2016-07-20 (×2): 200 mg via ORAL
  Filled 2016-07-18 (×2): qty 1

## 2016-07-18 MED ORDER — HEPARIN SODIUM (PORCINE) 5000 UNIT/ML IJ SOLN
5000.0000 [IU] | Freq: Three times a day (TID) | INTRAMUSCULAR | Status: DC
  Filled 2016-07-18 (×3): qty 1

## 2016-07-18 MED ORDER — ONDANSETRON HCL 4 MG PO TABS: 4.0000 mg | ORAL_TABLET | Freq: Four times a day (QID) | ORAL | Status: DC | PRN

## 2016-07-18 NOTE — Progress Notes (Signed)
Patient refused to do Incentive Spirometer right now. Let him know the day shift Respiratory Therapist would be by to assist him during the day. RT will continue to monitor.

## 2016-07-18 NOTE — Progress Notes (Signed)
PROGRESS NOTE    Dave Estrada  HNG:[id number] DOB: [birth year] DOA: 07/17/2016 PCP: [name] [name], MD    Brief Narrative:  46 yom with a hx of HIV/AIDS, end stage renal disease (on HD), and chronic systolic CHF, presents with complaints of HA, chills, and malaise. Patient also reports yellow foul-smelling drainage from his penis. While in the ED, he was noted to be febrile and tachycardic, and  hypokalemic with an elevated lactic acid. He was admitted for further treatment of sepsis.  His only clinical source is urethral discharge and positive UA.  He has negative CSF studies thus far.  He is on IV Van/Zosyn. He is better and his BP has improved. He has hx of low BP anyhow.   Assessment & Plan:   Principal Problem:   Sepsis, unspecified organism (HCC) Active Problems:   ESRD on hemodialysis (HCC)   Macrocytic anemia   AIDS (HCC)   Protein-calorie malnutrition, severe (HCC)   Chronic systolic heart failure (HCC)   Inguinal adenopathy   AIDS (acquired immune deficiency syndrome) (HCC)   Hyperbilirubinemia  1. Sepsis. Suspect this is secondary to soft-tissue infection. On admission patient was febrile and tachycardic with an elevated lactic acid. UA was indicative of infection. His groin exam is benign and no fluctuant. I  suspect urinary is his source.   Will follow blood and urine cultures. Continue empirical antibiotics vancomycin and zosyn. Continue IV fluids  2. ESRD. He is on hemodialysis. His las full session was 07/15/16. Nephrology has been consulted.  3. HIV/AIDS. Acquired perinatally, follows with ID at [facility]. Continue current medications including bactrim. His count is 75.  4. Chronic systolic CHF. He was in volume overload on admission. Present TTE shows an EF of 15-20%. ON SLIV and a fluid restricted diet. Continue metoprolol PRN.  5. Macrocytic anemia. No active signs of bleeding. Hgb is 12.3  6. Hyperbilirubinemia.  Total bili 7.7 on admission. RUQ [place] has been ordered.      DVT prophylaxis: IV Heparin  Code Status: FULL CODE  Family Communication: No family bedside Disposition Plan: Discharge home once improved.    Consultants:   None   Procedures:  None   Antimicrobials:  Zosyn 10/3 >>  Vancomycin 10/3 >>   Subjective: He has a mild cough.   Objective: Vitals:   07/18/16 0400 07/18/16 0500 07/18/16 0542 07/18/16 0600  BP: (!) 109/59 (!) 93/56  (!) 97/59  Pulse:      Resp: (!) 22 17  18   Temp:   98.9 F (37.2 C)   TempSrc:   Oral   SpO2: 96% 93%  96%  Weight:   55.6 kg (122 lb 9.2 oz)   Height:        Intake/Output Summary (Last 24 hours) at 07/18/16 0633 Last data filed at 07/18/16 0400  Gross per 24 hour  Intake             1490 ml  Output                0 ml  Net             1490 ml   Filed Weights   07/17/16 2201 07/18/16 0153 07/18/16 0542  Weight: 54.4 kg (120 lb) 55.4 kg (122 lb 2.2 oz) 55.6 kg (122 lb 9.2 oz)    Examination:  General exam: Appears calm and comfortable  Respiratory system: Clear to auscultation. Respiratory effort normal. Cardiovascular system: S1 & S2 heard, RRR. No JVD, murmurs,  rubs, gallops or clicks. No pedal edema. Gastrointestinal system: Abdomen is nondistended, soft and nontender. No organomegaly or masses felt. Normal bowel sounds heard. Central nervous system: Alert and oriented. No focal neurological deficits. Extremities: Symmetric 5 x 5 power. Skin: No rashes, lesions or ulcers Psychiatry: Judgement and insight appear normal. Mood & affect appropriate.     Data Reviewed: I have personally reviewed following labs and imaging studies  CBC:  Recent Labs Lab 07/17/16 2228 07/18/16 0337  WBC 7.6 13.7*  NEUTROABS 7.2  --   HGB 12.1* 12.3*  HCT 33.8* 35.7*  MCV 104.0* 102.0*  PLT 199 165   Basic Metabolic Panel:  Recent Labs Lab 07/17/16 2228 07/18/16 0337  NA 134* 133*  K 3.1* 3.4*  CL 95* 99*  CO2 26 23  GLUCOSE 102* 74  BUN 45* 44*  CREATININE 9.86* 9.45*   CALCIUM 8.4* 7.7*   GFR: Estimated Creatinine Clearance: 9.2 mL/min (by C-G formula based on SCr of 9.45 mg/dL (H)). Liver Function Tests:  Recent Labs Lab 07/17/16 2228 07/18/16 0337 07/18/16 0415  AST 32 29  --   ALT 13* 11*  --   ALKPHOS 125 108  --   BILITOT 7.7* 8.7* 8.5*  PROT 7.5 7.0  --   ALBUMIN 2.3* 2.1*  --     Recent Labs Lab 07/17/16 2230  LIPASE 20   Sepsis Labs:  Recent Labs Lab 07/17/16 2228 07/17/16 2229 07/17/16 2236 07/18/16 0050 07/18/16 0337  PROCALCITON 18.40  --   --   --   --   LATICACIDVEN  --  3.3* 3.77* 2.3* 2.2*    Recent Results (from the past 240 hour(s))  Blood Culture (routine x 2)     Status: None (Preliminary result)   Collection Time: 07/17/16 10:30 PM  Result Value Ref Range Status   Specimen Description BLOOD RIGHT FOREARM  Final   Special Requests BOTTLES DRAWN AEROBIC AND ANAEROBIC 6CC  Final   Culture PENDING  Incomplete   Report Status PENDING  Incomplete  Blood Culture (routine x 2)     Status: None (Preliminary result)   Collection Time: 07/17/16 10:30 PM  Result Value Ref Range Status   Specimen Description RIGHT ANTECUBITAL  Final   Special Requests BOTTLES DRAWN AEROBIC AND ANAEROBIC [facility]6CC  Final   Culture PENDING  Incomplete   Report Status PENDING  Incomplete  Gram stain     Status: None   Collection Time: 07/18/16 12:15 AM  Result Value Ref Range Status   Specimen Description CSF  Final   Special Requests NONE  Final   Gram Stain NO ORGANISMS SEEN CYTOSPIN SMEAR   Final   Report Status 07/18/2016 FINAL  Final         Radiology Studies: Dg Chest 2 View  Result Date: 07/17/2016 CLINICAL DATA:  Worsening cough and fever for 1 week. History of smoker, HIV, end-stage renal disease on dialysis, asthma. EXAM: CHEST  2 VIEW COMPARISON:  06/10/2016 FINDINGS: Slightly shallow inspiration. Normal heart size and pulmonary vascularity. No focal airspace disease or consolidation in the lungs. No blunting of  costophrenic angles. No pneumothorax. Mediastinal contours appear intact. IMPRESSION: No active cardiopulmonary disease. Electronically Signed   By: Burman NievesWilliam  Stevens M.D.   On: 07/17/2016 23:25        Scheduled Meds: . [START ON 07/20/2016] emtricitabine  200 mg Oral Once per day on Mon Thu  . etravirine  200 mg Oral Q12H  . heparin  5,000 Units Subcutaneous Q8H  .  metoprolol succinate  50 mg Oral Daily  . raltegravir  400 mg Oral BID  . sodium chloride flush  3 mL Intravenous Q12H  . sodium chloride flush  3 mL Intravenous Q12H  . [START ON 07/19/2016] sulfamethoxazole-trimethoprim  1 tablet Oral Once per day on Mon Wed Fri  . tenofovir  300 mg Oral Weekly   Continuous Infusions:    LOS: 0 days   Time spent: 25 minutes   Houston Siren, MD FACP [facility] If 7PM-7AM, please contact night-coverage [email or web address] Password [email or web address] 07/18/2016, 6:33 AM   By signing my name below, I, Cynda Acres, attest that this documentation has been prepared under the direction and in the presence of Houston Siren, MD. Electronically signed: Cynda Acres, Scribe.07/18/16 8:19 AM

## 2016-07-18 NOTE — ED Notes (Signed)
Pt ambulated to restroom & returned to room w/ no complications. 

## 2016-07-18 NOTE — H&P (Addendum)
History and Physical    Dave Estrada RXY:[id number] DOB: 17-Oct-1990 DOA: 07/17/2016  PCP: Corene Cornea [name], MD   Patient coming from: Home   Chief Complaint: headache, chills, malaise   HPI: Dave Estrada is a 25 y.o. male with medical history significant for HIV/AIDS, end-stage renal disease on hemodialysis, and chronic systolic CHF who presents the emergency department for evaluation of headache, chills, and malaise. Patient reports a general sense of malaise going back several months, but describes acute worsening over the past day. This has been associated with shaking chills and a frontal headache. Patient denies change in vision or hearing, photophobia, or neck pain or stiffness. There has been no recent sick contacts or long distance travel. Patient endorses some mild dyspnea, but denies significant cough. Patient also denies chest pain or palpitations. He endorses some loose stools, but denies abdominal pain or vomiting. Patient describes some yellow foul-smelling drainage from his penis for a few days as well as swollen nodules in the groin which are draining pus. He is followed for his HIV at [facility] with a viral load of 371 and CD4 count of 176 in July of this year. He is on Bactrim for PCP prophylaxis.  ED Course: Upon arrival to the ED, patient is found to be febrile to 38.4 C, saturating well on room air, tachycardic in the 130s, and with stable blood pressure. EKG demonstrates a sinus tachycardia with rate 127 and right axis deviation. Chest x-ray is negative for acute cardiopulmonary disease. CMP is notable formild total bilirubin of 7.7, mild hyponatremia and hypokalemia and BUN of 45 with serum creatinine of 9.86.CBC features a stable macrocytic anemia with hemoglobin of 12.1. Lactic acid returns elevated at value of 3.3. Patient was given 1 L of normal saline in the emergency department blood cultures were obtained, and CSF was obtained for Gram stain and culture. CSF  Gram stain is not suggestive of bacterial infection;there are no neutrophils observed. Urinalysis remains pending. Patient was treated with Zosyn and vancomycin empirically and provided symptomatic care with Tylenol and fentanyl. Tachycardia improved with the fluid and lactate has trended down. Patient will be admitted to the stepdown unit for ongoing evaluation and management of sepsis.   Review of Systems:  All other systems reviewed and apart from HPI, are negative.  Past Medical History:  Diagnosis Date  . A-V fistula ([place])    Placed on 10/27/2014 at [facility]; for future HD use.   . Asthma   . ESRD (end stage renal disease) ([place])   . Heart attack   . HIV (human immunodeficiency virus infection) ([place])   . Staphylococcus aureus bacteremia with sepsis ([place]) 09/09/2014  . Systolic dysfunction, left ventricle 12/20/2014   EF 20%     Past Surgical History:  Procedure Laterality Date  . INSERTION OF DIALYSIS CATHETER Left 09/15/2014   Procedure: INSERTION OF DIALYSIS CATHETER;  Surgeon: Serafina Mitchell, MD;  Location: [place];  Service: Vascular;  Laterality: Left;  . PORT A CATH REVISION    . TEE WITHOUT CARDIOVERSION N/A 09/14/2014   Procedure: TRANSESOPHAGEAL ECHOCARDIOGRAM (TEE);  Surgeon: Larey Dresser, MD;  Location: [place];  Service: Cardiovascular;  Laterality: N/A;  will be at [facility] by mon     reports that he has been smoking Cigarettes.  He started smoking about 13 years ago. He has a 5.50 pack-year smoking history. He has never used smokeless tobacco. He reports that he uses drugs, including Marijuana. He reports that he does  not drink alcohol.  Allergies  Allergen Reactions  . Bee Venom Anaphylaxis  . Other Anaphylaxis    mushrooms  . Aspirin Other (See Comments)    Reaction is unknown    Family History  Problem Relation Age of Onset  . Seizures Father      Prior to Admission medications   Medication Sig Start Date End Date Taking? Authorizing Provider  albuterol  (PROVENTIL HFA;VENTOLIN HFA) 108 (90 BASE) MCG/ACT inhaler Inhale 1-2 puffs into the lungs every 6 (six) hours as needed for wheezing or shortness of breath.   Yes Historical Provider, MD  emtricitabine (EMTRIVA) 200 MG capsule Take 200 mg by mouth 2 (two) times a week. Taken after dialysis on Tuesday and Thursday   Yes Historical Provider, MD  etravirine (INTELENCE) 100 MG tablet Take 200 mg by mouth every 12 (twelve) hours.   Yes Historical Provider, MD  metoprolol succinate (TOPROL-XL) 50 MG 24 hr tablet Take one and one half tab twice a day Take with or immediately following a meal. Patient taking differently: Take 50 mg by mouth daily.  01/12/16  Yes Shaune Pascal Bensimhon, MD  raltegravir (ISENTRESS) 400 MG tablet Take 400 mg by mouth 2 (two) times daily.   Yes Historical Provider, MD  sulfamethoxazole-trimethoprim (BACTRIM,SEPTRA) 400-80 MG tablet Take 1 tablet by mouth 3 (three) times a week. To take after dialysis days   Yes Historical Provider, MD  tenofovir (VIREAD) 300 MG tablet Take 300 mg by mouth once a week. To be taken on Tuesday after dialysis   Yes Historical Provider, MD    Physical Exam: Vitals:   07/18/16 0103 07/18/16 0130 07/18/16 0153 07/18/16 0155  BP:  103/65  109/68  Pulse:  119 (!) 115   Resp:  17    Temp: 99 F (37.2 C)  98.9 F (37.2 C)   TempSrc: Oral  Oral   SpO2:  100% 97%   Weight:   55.4 kg (122 lb 2.2 oz)   Height:   5' 5"  (1.651 m)       Constitutional: NAD, calm, in apparent discomfort Eyes: PERTLA, lids normal. Icteric sclerae ENMT: Mucous membranes are moist. Posterior pharynx clear of any exudate or lesions.   Neck: normal, supple, no masses, no thyromegaly Respiratory: Bibasilar crackles. Normal respiratory effort.    Cardiovascular: S1 & S2 heard, regular rate and rhythm, grade 3 systolic murmur at apex. No extremity edema. No carotid bruits. Abdomen: No distension, no tenderness, no masses palpated. Bowel sounds normal.  Musculoskeletal: no  clubbing / cyanosis. No joint deformity upper and lower extremities. Normal muscle tone.  Skin: Swollen, tender nodules near right inguinal crease spontaneously draining purulent material. Warm, dry, well-perfused. Neurologic: CN 2-12 grossly intact. Sensation intact, DTR normal. Strength 5/5 in all 4 limbs.  Psychiatric: Normal judgment and insight. Alert and oriented x 3. Normal mood and affect.     Labs on Admission: I have personally reviewed following labs and imaging studies  CBC:  Recent Labs Lab 07/17/16 2228  WBC 7.6  NEUTROABS 7.2  HGB 12.1*  HCT 33.8*  MCV 104.0*  PLT 638   Basic Metabolic Panel:  Recent Labs Lab 07/17/16 2228  NA 134*  K 3.1*  CL 95*  CO2 26  GLUCOSE 102*  BUN 45*  CREATININE 9.86*  CALCIUM 8.4*   GFR: Estimated Creatinine Clearance: 8.8 mL/min (by C-G formula based on SCr of 9.86 mg/dL (H)). Liver Function Tests:  Recent Labs Lab 07/17/16 2228  AST 32  ALT 13*  ALKPHOS 125  BILITOT 7.7*  PROT 7.5  ALBUMIN 2.3*    Recent Labs Lab 07/17/16 2230  LIPASE 20   No results for input(s): AMMONIA in the last 168 hours. Coagulation Profile: No results for input(s): INR, PROTIME in the last 168 hours. Cardiac Enzymes: No results for input(s): CKTOTAL, CKMB, CKMBINDEX, TROPONINI in the last 168 hours. BNP (last 3 results) No results for input(s): PROBNP in the last 8760 hours. HbA1C: No results for input(s): HGBA1C in the last 72 hours. CBG: No results for input(s): GLUCAP in the last 168 hours. Lipid Profile: No results for input(s): CHOL, HDL, LDLCALC, TRIG, CHOLHDL, LDLDIRECT in the last 72 hours. Thyroid Function Tests: No results for input(s): TSH, T4TOTAL, FREET4, T3FREE, THYROIDAB in the last 72 hours. Anemia Panel: No results for input(s): VITAMINB12, FOLATE, FERRITIN, TIBC, IRON, RETICCTPCT in the last 72 hours. Urine analysis:    Component Value Date/Time   COLORURINE YELLOW 12/17/2014 0017   APPEARANCEUR CLEAR  12/17/2014 0017   LABSPEC 1.020 12/17/2014 0017   PHURINE 6.5 12/17/2014 0017   GLUCOSEU NEGATIVE 12/17/2014 0017   HGBUR MODERATE (A) 12/17/2014 0017   BILIRUBINUR SMALL (A) 12/17/2014 0017   KETONESUR NEGATIVE 12/17/2014 0017   PROTEINUR >300 (A) 12/17/2014 0017   UROBILINOGEN 0.2 12/17/2014 0017   NITRITE NEGATIVE 12/17/2014 0017   LEUKOCYTESUR NEGATIVE 12/17/2014 0017   Sepsis Labs: @LABRCNTIP (procalcitonin:4,lacticidven:4) ) Recent Results (from the past 240 hour(s))  Blood Culture (routine x 2)     Status: None (Preliminary result)   Collection Time: 07/17/16 10:30 PM  Result Value Ref Range Status   Specimen Description BLOOD RIGHT FOREARM  Final   Special Requests BOTTLES DRAWN AEROBIC AND ANAEROBIC 6CC  Final   Culture PENDING  Incomplete   Report Status PENDING  Incomplete  Blood Culture (routine x 2)     Status: None (Preliminary result)   Collection Time: 07/17/16 10:30 PM  Result Value Ref Range Status   Specimen Description RIGHT ANTECUBITAL  Final   Special Requests BOTTLES DRAWN AEROBIC AND ANAEROBIC [facility]  Final   Culture PENDING  Incomplete   Report Status PENDING  Incomplete  Gram stain     Status: None   Collection Time: 07/18/16 12:15 AM  Result Value Ref Range Status   Specimen Description CSF  Final   Special Requests NONE  Final   Gram Stain NO ORGANISMS SEEN CYTOSPIN SMEAR   Final   Report Status 07/18/2016 FINAL  Final     Radiological Exams on Admission: Dg Chest 2 View  Result Date: 07/17/2016 CLINICAL DATA:  Worsening cough and fever for 1 week. History of smoker, HIV, end-stage renal disease on dialysis, asthma. EXAM: CHEST  2 VIEW COMPARISON:  06/10/2016 FINDINGS: Slightly shallow inspiration. Normal heart size and pulmonary vascularity. No focal airspace disease or consolidation in the lungs. No blunting of costophrenic angles. No pneumothorax. Mediastinal contours appear intact. IMPRESSION: No active cardiopulmonary disease. Electronically  Signed   By: Lucienne Capers M.D.   On: 07/17/2016 23:25    EKG: Independently reviewed. Sinus tachycardia (rate 127), RAD  Assessment/Plan  1. Sepsis, suspected secondary to soft-tissue infection  - Presents with chills, malaise, HA, but no meningismus  - Febrile, tachy to 130's, and with lactic acid 3.3; tachy and lactate improved with 1 L NS in ED - UA suggests infection; CXR clear; abd exam benign - Soft-tissue infection in right groin suspected source; appears to be an abscess, but with reported frank pus from  urethra, this could represent a draining bubo of LGV  - HA, but no meningeal signs and CSF gram-stain reassuring, will follow culture  - Blood and urine cultures are also incubating  - Check urine for GC/chlamydia; sent swab of pus from inguinal node for chlamydia   - Continue empiric vancomycin and Zosyn while awaiting culture data    2. ESRD - On HD TTS; reports completing full session on 07/15/16 without incident  - Potassium okay; no uremia; no edema on CXR  - Nephrology consultation requested for maintenance HD  3. HIV/AIDS - Acquired perinatally; follows with ID at [facility]  - VL 371 and CD4 176 in July 2017  - Continue Emtriva, Intelence, Isentress, Viread  - On Bactrim for PCP ppx   4. Chronic systolic CHF - Appears slightly volume-up on admission and maintenance HD has been requested for 07/18/16  - TTE (01/19/16) with EF 15-20%, severe LAE and RAE, moderate-severe MR, moderate TR, and mild PR  - SLIV, fluid-restrict diet, continue metoprolol as tolerated    5. Macrocytic anemia - Hgb 12.1 on admission, MCV 104.0; both indices stable  - No s/s of active bleeding  6. Hyperbilirubinemia  - Total bili elevated to 7.7 on admission, much higher than priors  - No abd pain, nausea, or vomiting; no elevation in transaminases or alk phos  - Fractionate the bilirubin and check RUQ [place]    DVT prophylaxis: sq heparin  Code Status: Full  Family Communication: Discussed  with patient Disposition Plan: Admit to stepdown  Consults called: None  Admission status: Inpatient    Vianne Bulls, MD [facility] Pager [phone]  If 7PM-7AM, please contact night-coverage [email or web address] Password [email or web address]  07/18/2016, 2:23 AM

## 2016-07-18 NOTE — Consult Note (Signed)
Reason for Consult: End-stage renal disease Referring Physician: Dr. Leitha Schuller is an 25 y.o. male.  HPI: He is a patient who has history of her asthma, end-stage renal disease on maintenance hemodialysis, AIDS presently came with complaints of headache, fever, chills. Patient also stated that he has some drainage from his groin area and tenderness. When he was evaluated patient was slightly hypotensive, tachycardic and possibility of sepsis was entertained admitted to the hospital. Presently he feels somewhat better. Still he has some chills but no fever. He has mild difficulty breathing. Patient presently denies any nausea or vomiting.  Past Medical History:  Diagnosis Date  . A-V fistula ([place])    Placed on 10/27/2014 at [facility]; for future HD use.   . Asthma   . ESRD (end stage renal disease) ([place])   . Heart attack   . HIV (human immunodeficiency virus infection) ([place])   . Staphylococcus aureus bacteremia with sepsis ([place]) 09/09/2014  . Systolic dysfunction, left ventricle 12/20/2014   EF 20%     Past Surgical History:  Procedure Laterality Date  . INSERTION OF DIALYSIS CATHETER Left 09/15/2014   Procedure: INSERTION OF DIALYSIS CATHETER;  Surgeon: Serafina Mitchell, MD;  Location: [facility];  Service: Vascular;  Laterality: Left;  . PORT A CATH REVISION    . TEE WITHOUT CARDIOVERSION N/A 09/14/2014   Procedure: TRANSESOPHAGEAL ECHOCARDIOGRAM (TEE);  Surgeon: Larey Dresser, MD;  Location: [facility] ENDOSCOPY;  Service: Cardiovascular;  Laterality: N/A;  will be at [facility] by mon    Family History  Problem Relation Age of Onset  . Seizures Father     Social History:  reports that he has been smoking Cigarettes.  He started smoking about 13 years ago. He has a 5.50 pack-year smoking history. He has never used smokeless tobacco. He reports that he uses drugs, including Marijuana. He reports that he does not drink alcohol.  Allergies:  Allergies  Allergen Reactions  . Bee Venom Anaphylaxis   . Other Anaphylaxis    mushrooms  . Aspirin Other (See Comments)    Reaction is unknown    Medications: I have reviewed the patient's current medications.  Results for orders placed or performed during the hospital encounter of 07/17/16 (from the past 48 hour(s))  CBC with Differential     Status: Abnormal   Collection Time: 07/17/16 10:28 PM  Result Value Ref Range   WBC 7.6 4.0 - 10.5 K/uL   RBC 3.25 (L) 4.22 - 5.81 MIL/uL   Hemoglobin 12.1 (L) 13.0 - 17.0 g/dL   HCT 33.8 (L) 39.0 - 52.0 %   MCV 104.0 (H) 78.0 - 100.0 fL   MCH 37.2 (H) 26.0 - 34.0 pg   MCHC 35.8 30.0 - 36.0 g/dL   RDW 16.2 (H) 11.5 - 15.5 %   Platelets 199 150 - 400 K/uL   Neutrophils Relative % 94 %   Neutro Abs 7.2 1.7 - 7.7 K/uL   Lymphocytes Relative 4 %   Lymphs Abs 0.3 (L) 0.7 - 4.0 K/uL   Monocytes Relative 1 %   Monocytes Absolute 0.1 0.1 - 1.0 K/uL   Eosinophils Relative 1 %   Eosinophils Absolute 0.1 0.0 - 0.7 K/uL   Basophils Relative 0 %   Basophils Absolute 0.0 0.0 - 0.1 K/uL  Comprehensive metabolic panel     Status: Abnormal   Collection Time: 07/17/16 10:28 PM  Result Value Ref Range   Sodium 134 (L) 135 - 145 mmol/L  Potassium 3.1 (L) 3.5 - 5.1 mmol/L   Chloride 95 (L) 101 - 111 mmol/L   CO2 26 22 - 32 mmol/L   Glucose, Bld 102 (H) 65 - 99 mg/dL   BUN 45 (H) 6 - 20 mg/dL   Creatinine, Ser 9.86 (H) 0.61 - 1.24 mg/dL   Calcium 8.4 (L) 8.9 - 10.3 mg/dL   Total Protein 7.5 6.5 - 8.1 g/dL   Albumin 2.3 (L) 3.5 - 5.0 g/dL   AST 32 15 - 41 U/L   ALT 13 (L) 17 - 63 U/L   Alkaline Phosphatase 125 38 - 126 U/L   Total Bilirubin 7.7 (H) 0.3 - 1.2 mg/dL   GFR calc non Af Amer 6 (L) >60 mL/min   GFR calc Af Amer 7 (L) >60 mL/min    Comment: (NOTE) The eGFR has been calculated using the CKD EPI equation. This calculation has not been validated in all clinical situations. eGFR's persistently <60 mL/min signify possible Chronic Kidney Disease.    Anion gap 13 5 - 15  Procalcitonin      Status: None   Collection Time: 07/17/16 10:28 PM  Result Value Ref Range   Procalcitonin 18.40 ng/mL    Comment:        Interpretation: PCT >= 10 ng/mL: Important systemic inflammatory response, almost exclusively due to severe bacterial sepsis or septic shock. (NOTE)         ICU PCT Algorithm               Non ICU PCT Algorithm    ----------------------------     ------------------------------         PCT < 0.25 ng/mL                 PCT < 0.1 ng/mL     Stopping of antibiotics            Stopping of antibiotics       strongly encouraged.               strongly encouraged.    ----------------------------     ------------------------------       PCT level decrease by               PCT < 0.25 ng/mL       >= 80% from peak PCT       OR PCT 0.25 - 0.5 ng/mL          Stopping of antibiotics                                             encouraged.     Stopping of antibiotics           encouraged.    ----------------------------     ------------------------------       PCT level decrease by              PCT >= 0.25 ng/mL       < 80% from peak PCT        AND PCT >= 0.5 ng/mL             Continuing antibiotics  encouraged.       Continuing antibiotics            encouraged.    ----------------------------     ------------------------------     PCT level increase compared          PCT > 0.5 ng/mL         with peak PCT AND          PCT >= 0.5 ng/mL             Escalation of antibiotics                                          strongly encouraged.      Escalation of antibiotics        strongly encouraged.   Lactic acid, plasma     Status: Abnormal   Collection Time: 07/17/16 10:29 PM  Result Value Ref Range   Lactic Acid, Venous 3.3 (HH) 0.5 - 1.9 mmol/L    Comment: CRITICAL RESULT CALLED TO, READ BACK BY AND VERIFIED WITH: TALBOT T AT 2354 ON [id number] BY FORSYTH K   Lipase, blood     Status: None   Collection Time: 07/17/16 10:30 PM   Result Value Ref Range   Lipase 20 11 - 51 U/L  Blood Culture (routine x 2)     Status: None (Preliminary result)   Collection Time: 07/17/16 10:30 PM  Result Value Ref Range   Specimen Description BLOOD RIGHT FOREARM    Special Requests BOTTLES DRAWN AEROBIC AND ANAEROBIC 6CC    Culture PENDING    Report Status PENDING   Blood Culture (routine x 2)     Status: None (Preliminary result)   Collection Time: 07/17/16 10:30 PM  Result Value Ref Range   Specimen Description RIGHT ANTECUBITAL    Special Requests BOTTLES DRAWN AEROBIC AND ANAEROBIC 6CC    Culture PENDING    Report Status PENDING   I-Stat CG4 Lactic Acid, ED  (not at  [facility])     Status: Abnormal   Collection Time: 07/17/16 10:36 PM  Result Value Ref Range   Lactic Acid, Venous 3.77 (HH) 0.5 - 1.9 mmol/L   Comment NOTIFIED PHYSICIAN   Influenza panel by PCR (type A & B, H1N1)     Status: None   Collection Time: 07/17/16 11:38 PM  Result Value Ref Range   Influenza A By PCR NEGATIVE NEGATIVE   Influenza B By PCR NEGATIVE NEGATIVE   H1N1 flu by pcr NOT DETECTED NOT DETECTED    Comment:        The Xpert Flu assay (FDA approved for nasal aspirates or washes and nasopharyngeal swab specimens), is intended as an aid in the diagnosis of influenza and should not be used as a sole basis for treatment.   CSF cell count with differential collection tube #: 1     Status: Abnormal   Collection Time: 07/18/16 12:15 AM  Result Value Ref Range   Tube # 1    Color, CSF COLORLESS COLORLESS   Appearance, CSF CLEAR CLEAR   Supernatant COLORLESS    RBC Count, CSF 412 (H) 0 /cu mm   WBC, CSF 0 0 - 5 /cu mm   Segmented Neutrophils-CSF TOO FEW TO COUNT, SMEAR AVAILABLE FOR REVIEW 0 - 6 %   Lymphs, CSF TOO FEW TO COUNT, SMEAR AVAILABLE FOR REVIEW 40 - 80 %  Monocyte-Macrophage-Spinal Fluid TOO FEW TO COUNT, SMEAR AVAILABLE FOR REVIEW 15 - 45 %   Eosinophils, CSF TOO FEW TO COUNT, SMEAR AVAILABLE FOR REVIEW 0 - 1 %   Other Cells, CSF  RARE LYMPHOCYTE   CSF cell count with differential collection tube #: 4     Status: Abnormal   Collection Time: 07/18/16 12:15 AM  Result Value Ref Range   Tube # 4    Color, CSF COLORLESS COLORLESS   Appearance, CSF CLEAR CLEAR   Supernatant COLORLESS    RBC Count, CSF 44 (H) 0 /cu mm   WBC, CSF 0 0 - 5 /cu mm   Segmented Neutrophils-CSF TOO FEW TO COUNT, SMEAR AVAILABLE FOR REVIEW 0 - 6 %   Lymphs, CSF TOO FEW TO COUNT, SMEAR AVAILABLE FOR REVIEW 40 - 80 %   Monocyte-Macrophage-Spinal Fluid TOO FEW TO COUNT, SMEAR AVAILABLE FOR REVIEW 15 - 45 %   Eosinophils, CSF TOO FEW TO COUNT, SMEAR AVAILABLE FOR REVIEW 0 - 1 %   Other Cells, CSF RARE LYMPHOCYTE   Gram stain     Status: None   Collection Time: 07/18/16 12:15 AM  Result Value Ref Range   Specimen Description CSF    Special Requests NONE    Gram Stain NO ORGANISMS SEEN CYTOSPIN SMEAR     Report Status 07/18/2016 FINAL   Glucose, CSF     Status: None   Collection Time: 07/18/16 12:15 AM  Result Value Ref Range   Glucose, CSF 63 40 - 70 mg/dL  Protein, CSF     Status: None   Collection Time: 07/18/16 12:15 AM  Result Value Ref Range   Total  Protein, CSF 20 15 - 45 mg/dL  Lactic acid, plasma     Status: Abnormal   Collection Time: 07/18/16 12:50 AM  Result Value Ref Range   Lactic Acid, Venous 2.3 (HH) 0.5 - 1.9 mmol/L    Comment: CRITICAL RESULT CALLED TO, READ BACK BY AND VERIFIED WITH: TALBOT T AT 0130ON [id number] BY FORSYTH K   Urinalysis, Routine w reflex microscopic (not at [facility])     Status: Abnormal   Collection Time: 07/18/16  1:23 AM  Result Value Ref Range   Color, Urine AMBER (A) YELLOW    Comment: BIOCHEMICALS MAY BE AFFECTED BY COLOR   APPearance HAZY (A) CLEAR   Specific Gravity, Urine 1.015 1.005 - 1.030   pH 7.5 5.0 - 8.0   Glucose, UA 100 (A) NEGATIVE mg/dL   Hgb urine dipstick MODERATE (A) NEGATIVE   Bilirubin Urine MODERATE (A) NEGATIVE   Ketones, ur NEGATIVE NEGATIVE mg/dL   Protein, ur >300 (A)  NEGATIVE mg/dL   Nitrite NEGATIVE NEGATIVE   Leukocytes, UA SMALL (A) NEGATIVE  Urine microscopic-add on     Status: Abnormal   Collection Time: 07/18/16  1:23 AM  Result Value Ref Range   Squamous Epithelial / LPF TOO NUMEROUS TO COUNT (A) NONE SEEN   WBC, UA TOO NUMEROUS TO COUNT 0 - 5 WBC/hpf   RBC / HPF 0-5 0 - 5 RBC/hpf   Bacteria, UA MANY (A) NONE SEEN  MRSA PCR Screening     Status: None   Collection Time: 07/18/16  2:00 AM  Result Value Ref Range   MRSA by PCR NEGATIVE NEGATIVE    Comment:        The GeneXpert MRSA Assay (FDA approved for NASAL specimens only), is one component of a comprehensive MRSA colonization surveillance program. It is not intended  to diagnose MRSA infection nor to guide or monitor treatment for MRSA infections.   Lactic acid, plasma     Status: Abnormal   Collection Time: 07/18/16  3:37 AM  Result Value Ref Range   Lactic Acid, Venous 2.2 (HH) 0.5 - 1.9 mmol/L    Comment: CRITICAL RESULT CALLED TO, READ BACK BY AND VERIFIED WITH: PHILLIPS C AT 0411 0N [id number] BY FORSYTH K   Comprehensive metabolic panel     Status: Abnormal   Collection Time: 07/18/16  3:37 AM  Result Value Ref Range   Sodium 133 (L) 135 - 145 mmol/L   Potassium 3.4 (L) 3.5 - 5.1 mmol/L   Chloride 99 (L) 101 - 111 mmol/L   CO2 23 22 - 32 mmol/L   Glucose, Bld 74 65 - 99 mg/dL   BUN 44 (H) 6 - 20 mg/dL   Creatinine, Ser 9.45 (H) 0.61 - 1.24 mg/dL   Calcium 7.7 (L) 8.9 - 10.3 mg/dL   Total Protein 7.0 6.5 - 8.1 g/dL   Albumin 2.1 (L) 3.5 - 5.0 g/dL   AST 29 15 - 41 U/L   ALT 11 (L) 17 - 63 U/L   Alkaline Phosphatase 108 38 - 126 U/L   Total Bilirubin 8.7 (H) 0.3 - 1.2 mg/dL   GFR calc non Af Amer 7 (L) >60 mL/min   GFR calc Af Amer 8 (L) >60 mL/min    Comment: (NOTE) The eGFR has been calculated using the CKD EPI equation. This calculation has not been validated in all clinical situations. eGFR's persistently <60 mL/min signify possible Chronic Kidney Disease.     Anion gap 11 5 - 15  CBC     Status: Abnormal   Collection Time: 07/18/16  3:37 AM  Result Value Ref Range   WBC 13.7 (H) 4.0 - 10.5 K/uL   RBC 3.50 (L) 4.22 - 5.81 MIL/uL   Hemoglobin 12.3 (L) 13.0 - 17.0 g/dL   HCT 35.7 (L) 39.0 - 52.0 %   MCV 102.0 (H) 78.0 - 100.0 fL   MCH 35.1 (H) 26.0 - 34.0 pg   MCHC 34.5 30.0 - 36.0 g/dL   RDW 16.4 (H) 11.5 - 15.5 %   Platelets 165 150 - 400 K/uL  Bilirubin, fractionated(tot/dir/indir)     Status: Abnormal   Collection Time: 07/18/16  4:15 AM  Result Value Ref Range   Total Bilirubin 8.5 (H) 0.3 - 1.2 mg/dL   Bilirubin, Direct 5.2 (H) 0.1 - 0.5 mg/dL   Indirect Bilirubin 3.3 (H) 0.3 - 0.9 mg/dL    Dg Chest 2 View  Result Date: 07/17/2016 CLINICAL DATA:  Worsening cough and fever for 1 week. History of smoker, HIV, end-stage renal disease on dialysis, asthma. EXAM: CHEST  2 VIEW COMPARISON:  06/10/2016 FINDINGS: Slightly shallow inspiration. Normal heart size and pulmonary vascularity. No focal airspace disease or consolidation in the lungs. No blunting of costophrenic angles. No pneumothorax. Mediastinal contours appear intact. IMPRESSION: No active cardiopulmonary disease. Electronically Signed   By: Lucienne Capers M.D.   On: 07/17/2016 23:25    Review of Systems  Respiratory: Positive for cough, shortness of breath and wheezing. Negative for hemoptysis and sputum production.   Cardiovascular: Negative for orthopnea and leg swelling.  Gastrointestinal: Negative for nausea and vomiting.  Musculoskeletal: Positive for joint pain.   Blood pressure (!) 89/60, pulse (!) 119, temperature 98.9 F (37.2 C), temperature source Oral, resp. rate 19, height 5' 5"  (1.651 m), weight 55.6 kg (122 lb  9.2 oz), SpO2 95 %. Physical Exam  Constitutional: He is oriented to person, place, and time. No distress.  Eyes: No scleral icterus.  Neck: JVD present.  Cardiovascular: Normal rate and regular rhythm.   Murmur heard. Respiratory: He has wheezes. He  has no rales.  GI: He exhibits no distension. There is no tenderness.  Musculoskeletal: He exhibits no edema.  Neurological: He is alert and oriented to person, place, and time.    Assessment/Plan: Problem #1 possible sepsis. Presently patient is a febrile but he is white blood cell count is high this morning. Chest x-ray no significant finding. Possibly abscess of his groin. Problem #2 end-stage renal disease: Status post hemodialysis on Saturday. Patient came with hypokalemia. He has a mild difficulty breathing but also has history of asthma. Problem #3 history of AIDS Problem #4 history of systolic dysfunction with low ejection fraction Problem #5 anemia: His hemoglobin is above our target goal. Problem #6 metabolic bone disease: His calcium is range but phosphorus is not available. Problem #7 elevated bilirubin: Etiology as this moment is no clear Plan: We'll make arrangements for patient to get dialysis today 2] will use 3K/2.5 calcium bath for 3 and half hours 3] will remove 2 L his systolic blood pressures above 90  Cashlynn Yearwood S 07/18/2016, 8:11 AM

## 2016-07-18 NOTE — ED Notes (Addendum)
CRITICAL VALUE ALERT  Critical value received:  Lactic acid 2.3  Date of notification:  07/18/2016  Time of notification:  0130  Critical value read back:Yes.    Nurse who received alert:  Neldon Mc RN  MD notified (1st page):  Dr. Antionette Char  Time of first page:  0132  MD notified (2nd page):  Time of second page:  Responding MD:    Time MD responded:

## 2016-07-18 NOTE — Procedures (Signed)
    HEMODIALYSIS TREATMENT NOTE:  3.5 hour heparin-free dialysis completed via left upper arm AVG (16g/antegrade). Goal NOT met:  Unable to tolerate removal of 2 liters as ordered.  Ultrafiltration was suspended for 1 hour 45 minutes due to hypotension.  All blood was returned and hemostasis was achieved within 10 minutes.  Dialyzer jaundiced after blood return.  Rockwell Alexandria, RN, CDN

## 2016-07-18 NOTE — Progress Notes (Signed)
Pharmacy Antibiotic Note  Dave Estrada is a 25 y.o. male admitted on 07/17/2016 with sepsis.  Pharmacy has been consulted for Vancomycin and Zosyn dosing.  Plan: Vancomycin 1gm loading dose given in ED, then 500mg  IV after HD on T-TH-Sat Zosyn 3.375gm IV given in ED, then zosyn 3.375gm IV q12h EID(4-hour infusion) F/U cultures Monitor V/S and labs Vancomycin levels as indicated  Height: 5\' 5"  (165.1 cm) Weight: 122 lb 9.2 oz (55.6 kg) IBW/kg (Calculated) : 61.5  Temp (24hrs), Avg:99 F (37.2 C), Min:97.2 F (36.2 C), Max:101.2 F (38.4 C)   Recent Labs Lab 07/17/16 2228 07/17/16 2229 07/17/16 2236 07/18/16 0050 07/18/16 0337  WBC 7.6  --   --   --  13.7*  CREATININE 9.86*  --   --   --  9.45*  LATICACIDVEN  --  3.3* 3.77* 2.3* 2.2*    Estimated Creatinine Clearance: 9.2 mL/min (by C-G formula based on SCr of 9.45 mg/dL (H)).    Allergies  Allergen Reactions  . Bee Venom Anaphylaxis  . Other Anaphylaxis    mushrooms  . Aspirin Other (See Comments)    Reaction is unknown    Antimicrobials this admission: Vancomycin 10/2 >>  Zosyn 10/2 >>   Dose adjustments this admission: n/a  Microbiology results: 10/2 BCx: pending 10/3 CSF cx: pending. Gram stain no organisms seen 10/3  UCx: in process   10/3 MRSA PCR: negative  Thank you for allowing pharmacy to be a part of this patient's care. Elder Cyphers, BS Pharm D, [place] Clinical Pharmacist Pager [phone]  07/18/2016 10:41 AM

## 2016-07-18 NOTE — ED Notes (Signed)
CRITICAL VALUE ALERT  Critical value received:  Lactic acid  Date of notification:  07/18/2016  Time of notification:  2354  Critical value read back:Yes.    Nurse who received alert:  Neldon Mc RN  MD notified (1st page):  Dr. Clayborne Dana  Time of first page:  0006  MD notified (2nd page):  Time of second page:  Responding MD:    Time MD responded:

## 2016-07-19 DIAGNOSIS — N186 End stage renal disease: Secondary | ICD-10-CM

## 2016-07-19 DIAGNOSIS — B20 Human immunodeficiency virus [HIV] disease: Secondary | ICD-10-CM

## 2016-07-19 DIAGNOSIS — Z992 Dependence on renal dialysis: Secondary | ICD-10-CM

## 2016-07-19 DIAGNOSIS — A419 Sepsis, unspecified organism: Principal | ICD-10-CM

## 2016-07-19 LAB — RENAL FUNCTION PANEL
ALBUMIN: 2.1 g/dL — AB (ref 3.5–5.0)
ANION GAP: 10 (ref 5–15)
BUN: 28 mg/dL — ABNORMAL HIGH (ref 6–20)
CO2: 27 mmol/L (ref 22–32)
Calcium: 7.7 mg/dL — ABNORMAL LOW (ref 8.9–10.3)
Chloride: 100 mmol/L — ABNORMAL LOW (ref 101–111)
Creatinine, Ser: 5.8 mg/dL — ABNORMAL HIGH (ref 0.61–1.24)
GFR calc non Af Amer: 12 mL/min — ABNORMAL LOW (ref >60)
GFR, EST AFRICAN AMERICAN: 14 mL/min — AB (ref >60)
GLUCOSE: 74 mg/dL (ref 65–99)
PHOSPHORUS: 2.4 mg/dL — AB (ref 2.5–4.6)
POTASSIUM: 3.5 mmol/L (ref 3.5–5.1)
Sodium: 137 mmol/L (ref 135–145)

## 2016-07-19 LAB — GLUCOSE, CAPILLARY
Glucose-Capillary: 113 mg/dL — ABNORMAL HIGH (ref 65–99)
Glucose-Capillary: 94 mg/dL (ref 65–99)

## 2016-07-19 LAB — HEPATITIS B SURFACE ANTIGEN: Hepatitis B Surface Ag: NEGATIVE

## 2016-07-19 LAB — HERPES SIMPLEX VIRUS(HSV) DNA BY PCR
HSV 1 DNA: NEGATIVE
HSV 2 DNA: NEGATIVE

## 2016-07-19 LAB — URINE CULTURE

## 2016-07-19 LAB — CBC
HEMATOCRIT: 28.2 % — AB (ref 39.0–52.0)
HEMOGLOBIN: 9.7 g/dL — AB (ref 13.0–17.0)
MCH: 35.4 pg — AB (ref 26.0–34.0)
MCHC: 34.4 g/dL (ref 30.0–36.0)
MCV: 102.9 fL — ABNORMAL HIGH (ref 78.0–100.0)
Platelets: 183 10*3/uL (ref 150–400)
RBC: 2.74 MIL/uL — AB (ref 4.22–5.81)
RDW: 16.8 % — ABNORMAL HIGH (ref 11.5–15.5)
WBC: 15.2 10*3/uL — ABNORMAL HIGH (ref 4.0–10.5)

## 2016-07-19 LAB — RPR: RPR Ser Ql: NONREACTIVE

## 2016-07-19 MED ORDER — EPOETIN ALFA 10000 UNIT/ML IJ SOLN
8000.0000 [IU] | INTRAMUSCULAR | Status: DC
  Administered 2016-07-20: 8000 [IU] via SUBCUTANEOUS
  Filled 2016-07-19 (×2): qty 0.8

## 2016-07-19 NOTE — Progress Notes (Signed)
Urology consult called this am to Dr. Ronne Binning who is on call today [facility]. Voicemail left asking for a return phone call in regards to a new consult for this patient.

## 2016-07-19 NOTE — Care Management Important Message (Signed)
Important Message  Patient Details  Name: Dave Estrada MRN: [id number] Date of Birth: 12-13-1990   Medicare Important Message Given:  Yes    Malcolm Metro, RN 07/19/2016, 4:01 PM

## 2016-07-19 NOTE — Progress Notes (Signed)
Dave RondoDuane K Withey  MRN: [id number]  DOB/AGE: 18-May-1991 25 y.o.  Primary Care Physician:JASON [name], MD  Admit date: 07/17/2016  Chief Complaint:  Chief Complaint  Patient presents with  . Migraine    S-Pt presented on  07/17/2016 with  Chief Complaint  Patient presents with  . Migraine  .    Pt today feels better  Meds . emtricitabine  200 mg Oral Once per day on Tue Thu  . etravirine  200 mg Oral Q12H  . heparin  5,000 Units Subcutaneous Q8H  . metoprolol succinate  50 mg Oral Daily  . piperacillin-tazobactam (ZOSYN)  IV  3.375 g Intravenous Q12H  . raltegravir  400 mg Oral BID  . sodium chloride flush  3 mL Intravenous Q12H  . sodium chloride flush  3 mL Intravenous Q12H  . sulfamethoxazole-trimethoprim  0.5 tablet Oral Q M,W,F  . tenofovir  300 mg Oral Weekly  . vancomycin  500 mg Intravenous Q T,Th,Sa-HD        Physical Exam: Vital signs in last 24 hours: Temp:  [97.2 F (36.2 C)-97.8 F (36.6 C)] 97.2 F (36.2 C) (10/04 0400) Pulse Rate:  [97-110] 103 (10/03 1700) Resp:  [0-22] 15 (10/04 0630) BP: (71-124)/(41-101) 94/75 (10/04 0630) SpO2:  [89 %-100 %] 100 % (10/04 0630) Weight:  [118 lb 13.3 oz (53.9 kg)-123 lb 3.8 oz (55.9 kg)] 118 lb 13.3 oz (53.9 kg) (10/04 0500) Weight change: 3 lb 3.8 oz (1.468 kg) Last BM Date: 07/18/16  Intake/Output from previous day: 10/03 0701 - 10/04 0700 In: 690 [P.O.:240; I.V.:250; IV Piggyback:200] Out: 1024 [Stool:1] No intake/output data recorded.   Physical Exam: General- pt is awake,alert, oriented to time place and person Resp- No acute REsp distress,  Rhonchi CVS- S1S2 regular in rate and rhythm GIT- BS+, soft, NT, ND EXT- NO LE Edema, Cyanosis Access- AVF present  Lab Results: CBC  Recent Labs  07/18/16 0337 07/19/16 0435  WBC 13.7* 15.2*  HGB 12.3* 9.7*  HCT 35.7* 28.2*  PLT 165 183    BMET  Recent Labs  07/18/16 0337 07/19/16 0435  NA 133* 137  K 3.4* 3.5  CL 99* 100*  CO2 23  27  GLUCOSE 74 74  BUN 44* 28*  CREATININE 9.45* 5.80*  CALCIUM 7.7* 7.7*    MICRO Recent Results (from the past 240 hour(s))  Blood Culture (routine x 2)     Status: None (Preliminary result)   Collection Time: 07/17/16 10:30 PM  Result Value Ref Range Status   Specimen Description BLOOD RIGHT FOREARM DRAWN BY RN  Final   Special Requests BOTTLES DRAWN AEROBIC AND ANAEROBIC 6CC  Final   Culture NO GROWTH 2 DAYS  Final   Report Status PENDING  Incomplete  Blood Culture (routine x 2)     Status: None (Preliminary result)   Collection Time: 07/17/16 10:30 PM  Result Value Ref Range Status   Specimen Description BLOOD RIGHT ANTECUBITAL DRAWN BY RN  Final   Special Requests BOTTLES DRAWN AEROBIC AND ANAEROBIC 6CC  Final   Culture NO GROWTH 2 DAYS  Final   Report Status PENDING  Incomplete  CSF culture     Status: None (Preliminary result)   Collection Time: 07/18/16 12:15 AM  Result Value Ref Range Status   Specimen Description BACK  Final   Special Requests NONE  Final   Gram Stain NO WBC SEEN NO ORGANISMS SEEN CYTOSPIN   Final   Culture   Final  NO GROWTH 1 DAY Performed at [facility]    Report Status PENDING  Incomplete  MRSA PCR Screening     Status: None   Collection Time: 07/18/16  2:00 AM  Result Value Ref Range Status   MRSA by PCR NEGATIVE NEGATIVE Final    Comment:        The GeneXpert MRSA Assay (FDA approved for NASAL specimens only), is one component of a comprehensive MRSA colonization surveillance program. It is not intended to diagnose MRSA infection nor to guide or monitor treatment for MRSA infections.       Lab Results  Component Value Date   CALCIUM 7.7 (L) 07/19/2016   PHOS 2.4 (L) 07/19/2016               Impression: 1)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  Pt is On Tues/Thurs/Satur schedule as outpt  As inpt last HD session was on Tuesday NO need of HD today  2)HTN BP  stable   3)Anemia In ESRD the goal for HGb is 9--11. Will keep on Epo   4)CKD Mineral-Bone Disorder Calcium Near to goal when corrected for low albumin Phos  at goal ( on higher side)      Non adherent to medications  5)ID- hx of HIV Admitted with possible sepsis Febrile  And tachycardiac On Broad spectrum abx Primary team following    6)Electrolytes  Hypokalemic   better  Hyponatremic     Better  7)Acid base Co2  at goal     Plan:  Will continue current care Will dialzye in am Will use 3 k bath Will keep on epo  Brookes Craine S 07/19/2016, 9:21 AM

## 2016-07-19 NOTE — Progress Notes (Signed)
Patient alert and oriented. Vital signs stable. IV patent. Report called to Chales Abrahams, RN. Receiving nurse verbalized of report. Patient transferred to room 335 via wheelchair with nursing staff and family members accompanied.

## 2016-07-19 NOTE — Progress Notes (Signed)
PROGRESS NOTE    Dave Estrada  WUJ:[id number] DOB: 1991/05/30 DOA: 07/17/2016 PCP: Barbara CowerJASON ERIC Valentina LucksSTOUT, MD     Brief Narrative:  25 y/o man admitted from home on 10/02 with HA, chills and malaise. Has a h/o ESRD on HD and CHF with known EF of 15%. Noted to have pain in his penis with phimosis and purulent drainage. LP without evidence for meningitis. Found to have sepsis on admission. On Vanc/zosyn. Cx pending.   Assessment & Plan:   Principal Problem:   Sepsis, unspecified organism (HCC) Active Problems:   ESRD on hemodialysis (HCC)   Macrocytic anemia   AIDS (HCC)   Protein-calorie malnutrition, severe (HCC)   Chronic systolic heart failure (HCC)   Inguinal adenopathy   AIDS (acquired immune deficiency syndrome) (HCC)   Hyperbilirubinemia   Sepsis -Sepsis parameters improved. -Fluid resuscitation difficult given CHF and ESRD. -Source remains unclear: CSF negative, UA dirty, BC negative at day 2, urine cx pending. -Need to consider phimosis as source given purulent drainage. -Will DC vancomycin as doubt MRSA as source.  Phimosis -With purulent drainage, pain and no other source of sepsis, need to consider this is a potential source. -Will request GU evaluation.  AIDS/HIV -Unknown CD4 count. -Will request. -Will continue HARRT.  Chronic Systolic CHF -Known EF of 15%. -Is compensated at present.  ESRD -On HD. -Appreciate nephrology's input. -Received HD on 10/3.   DVT prophylaxis: SQ heparin Code Status: full code Family Communication: patient only Disposition Plan: transfer to floor  Consultants:   GU requested  Procedures:   None  Antimicrobials:   Zosyn 10/3-->  Vancomycin 10/3-->10/04   Subjective: Feels improved. Still c/o pain around his penis. Says "the problem is I can't pull the skin back to clean it right".  Objective: Vitals:   07/19/16 0630 07/19/16 0700 07/19/16 0800 07/19/16 0900  BP: 94/75 99/66 99/65  94/64  Pulse:        Resp: 15 18 13 11   Temp:      TempSrc:      SpO2: 100% 100% 100% 100%  Weight:      Height:        Intake/Output Summary (Last 24 hours) at 07/19/16 1114 Last data filed at 07/19/16 0400  Gross per 24 hour  Intake              640 ml  Output             1024 ml  Net             -384 ml   Filed Weights   07/18/16 0542 07/18/16 1330 07/19/16 0500  Weight: 55.6 kg (122 lb 9.2 oz) 55.9 kg (123 lb 3.8 oz) 53.9 kg (118 lb 13.3 oz)    Examination:  General exam: Alert, awake, oriented x 3 Respiratory system: Clear to auscultation. Respiratory effort normal. Cardiovascular system:RRR. No murmurs, rubs, gallops. Gastrointestinal system: Abdomen is nondistended, soft and nontender. No organomegaly or masses felt. Normal bowel sounds heard. Central nervous system: Alert and oriented. No focal neurological deficits. Extremities: No C/C/E, +pedal pulses Psychiatry: Judgement and insight appear normal. Mood & affect appropriate.     Data Reviewed: I have personally reviewed following labs and imaging studies  CBC:  Recent Labs Lab 07/17/16 2228 07/18/16 0337 07/19/16 0435  WBC 7.6 13.7* 15.2*  NEUTROABS 7.2  --   --   HGB 12.1* 12.3* 9.7*  HCT 33.8* 35.7* 28.2*  MCV 104.0* 102.0* 102.9*  PLT 199 165 183  Basic Metabolic Panel:  Recent Labs Lab 07/17/16 2228 07/18/16 0337 07/19/16 0435  NA 134* 133* 137  K 3.1* 3.4* 3.5  CL 95* 99* 100*  CO2 26 23 27   GLUCOSE 102* 74 74  BUN 45* 44* 28*  CREATININE 9.86* 9.45* 5.80*  CALCIUM 8.4* 7.7* 7.7*  PHOS  --   --  2.4*   GFR: Estimated Creatinine Clearance: 14.6 mL/min (by C-G formula based on SCr of 5.8 mg/dL (H)). Liver Function Tests:  Recent Labs Lab 07/17/16 2228 07/18/16 0337 07/18/16 0415 07/19/16 0435  AST 32 29  --   --   ALT 13* 11*  --   --   ALKPHOS 125 108  --   --   BILITOT 7.7* 8.7* 8.5*  --   PROT 7.5 7.0  --   --   ALBUMIN 2.3* 2.1*  --  2.1*    Recent Labs Lab 07/17/16 2230  LIPASE 20    No results for input(s): AMMONIA in the last 168 hours. Coagulation Profile: No results for input(s): INR, PROTIME in the last 168 hours. Cardiac Enzymes: No results for input(s): CKTOTAL, CKMB, CKMBINDEX, TROPONINI in the last 168 hours. BNP (last 3 results) No results for input(s): PROBNP in the last 8760 hours. HbA1C: No results for input(s): HGBA1C in the last 72 hours. CBG:  Recent Labs Lab 07/18/16 0744 07/19/16 0749  GLUCAP 91 113*   Lipid Profile: No results for input(s): CHOL, HDL, LDLCALC, TRIG, CHOLHDL, LDLDIRECT in the last 72 hours. Thyroid Function Tests: No results for input(s): TSH, T4TOTAL, FREET4, T3FREE, THYROIDAB in the last 72 hours. Anemia Panel: No results for input(s): VITAMINB12, FOLATE, FERRITIN, TIBC, IRON, RETICCTPCT in the last 72 hours. Urine analysis:    Component Value Date/Time   COLORURINE AMBER (A) 07/18/2016 0123   APPEARANCEUR HAZY (A) 07/18/2016 0123   LABSPEC 1.015 07/18/2016 0123   PHURINE 7.5 07/18/2016 0123   GLUCOSEU 100 (A) 07/18/2016 0123   HGBUR MODERATE (A) 07/18/2016 0123   BILIRUBINUR MODERATE (A) 07/18/2016 0123   KETONESUR NEGATIVE 07/18/2016 0123   PROTEINUR >300 (A) 07/18/2016 0123   UROBILINOGEN 0.2 12/17/2014 0017   NITRITE NEGATIVE 07/18/2016 0123   LEUKOCYTESUR SMALL (A) 07/18/2016 0123   Sepsis Labs: @LABRCNTIP (procalcitonin:4,lacticidven:4)  ) Recent Results (from the past 240 hour(s))  Blood Culture (routine x 2)     Status: None (Preliminary result)   Collection Time: 07/17/16 10:30 PM  Result Value Ref Range Status   Specimen Description BLOOD RIGHT FOREARM DRAWN BY RN  Final   Special Requests BOTTLES DRAWN AEROBIC AND ANAEROBIC 6CC  Final   Culture NO GROWTH 2 DAYS  Final   Report Status PENDING  Incomplete  Blood Culture (routine x 2)     Status: None (Preliminary result)   Collection Time: 07/17/16 10:30 PM  Result Value Ref Range Status   Specimen Description BLOOD RIGHT ANTECUBITAL DRAWN BY  RN  Final   Special Requests BOTTLES DRAWN AEROBIC AND ANAEROBIC 6CC  Final   Culture NO GROWTH 2 DAYS  Final   Report Status PENDING  Incomplete  CSF culture     Status: None (Preliminary result)   Collection Time: 07/18/16 12:15 AM  Result Value Ref Range Status   Specimen Description BACK  Final   Special Requests NONE  Final   Gram Stain NO WBC SEEN NO ORGANISMS SEEN CYTOSPIN   Final   Culture   Final    NO GROWTH 1 DAY Performed at [facility]  Report Status PENDING  Incomplete  MRSA PCR Screening     Status: None   Collection Time: 07/18/16  2:00 AM  Result Value Ref Range Status   MRSA by PCR NEGATIVE NEGATIVE Final    Comment:        The GeneXpert MRSA Assay (FDA approved for NASAL specimens only), is one component of a comprehensive MRSA colonization surveillance program. It is not intended to diagnose MRSA infection nor to guide or monitor treatment for MRSA infections.          Radiology Studies: Dg Chest 2 View  Result Date: 07/17/2016 CLINICAL DATA:  Worsening cough and fever for 1 week. History of smoker, HIV, end-stage renal disease on dialysis, asthma. EXAM: CHEST  2 VIEW COMPARISON:  06/10/2016 FINDINGS: Slightly shallow inspiration. Normal heart size and pulmonary vascularity. No focal airspace disease or consolidation in the lungs. No blunting of costophrenic angles. No pneumothorax. Mediastinal contours appear intact. IMPRESSION: No active cardiopulmonary disease. Electronically Signed   By: Burman Nieves M.D.   On: 07/17/2016 23:25   US Abdomen Limited Ruq  Result Date: 07/18/2016 CLINICAL DATA:  Sepsis, hyperbilirubinemia, history of HIV, end-stage renal disease, CHF. EXAM: US ABDOMEN LIMITED - RIGHT UPPER QUADRANT COMPARISON:  Abdominal ultrasound of December 02, 2015 FINDINGS: Gallbladder: The gallbladder wall remains thickened at 8 mm. There are echogenic non mobile non shadowing foci adherent to the gallbladder lumen measuring up to  4 mm. There is a small amount of pericholecystic fluid. Common bile duct: Diameter: 2 mm Liver: The hepatic echotexture is mildly increased diffusely. There is no focal mass nor ductal dilation. The surface contour of the liver is smooth where visualized. IMPRESSION: Chronic gallbladder wall thickening similar to that seen previously in the setting of ascites. No ascites is observed today. Multiple gallbladder polyps. No positive sonographic Murphy's sign. Increased hepatic echotexture likely reflects fatty infiltrative change. No focal mass or ductal dilation. Electronically Signed   By: David  [place] M.D.   On: 07/18/2016 10:10        Scheduled Meds: . emtricitabine  200 mg Oral Once per day on Tue Thu  . etravirine  200 mg Oral Q12H  . heparin  5,000 Units Subcutaneous Q8H  . metoprolol succinate  50 mg Oral Daily  . piperacillin-tazobactam (ZOSYN)  IV  3.375 g Intravenous Q12H  . raltegravir  400 mg Oral BID  . sodium chloride flush  3 mL Intravenous Q12H  . sodium chloride flush  3 mL Intravenous Q12H  . sulfamethoxazole-trimethoprim  0.5 tablet Oral Q M,W,F  . tenofovir  300 mg Oral Weekly   Continuous Infusions:    LOS: 1 day    Time spent: 35 minutes. Greater than 50% of this time was spent in direct contact with the patient coordinating care.     Chaya Jan, MD [facility] Pager [phone]  If 7PM-7AM, please contact night-coverage [email or web address] Password [email or web address] 07/19/2016, 11:14 AM

## 2016-07-20 LAB — CBC
HCT: 28.6 % — ABNORMAL LOW (ref 39.0–52.0)
HEMOGLOBIN: 9.6 g/dL — AB (ref 13.0–17.0)
MCH: 34.4 pg — ABNORMAL HIGH (ref 26.0–34.0)
MCHC: 33.6 g/dL (ref 30.0–36.0)
MCV: 102.5 fL — ABNORMAL HIGH (ref 78.0–100.0)
Platelets: 223 10*3/uL (ref 150–400)
RBC: 2.79 MIL/uL — ABNORMAL LOW (ref 4.22–5.81)
RDW: 16.8 % — ABNORMAL HIGH (ref 11.5–15.5)
WBC: 11.5 10*3/uL — AB (ref 4.0–10.5)

## 2016-07-20 LAB — GLUCOSE, CAPILLARY
GLUCOSE-CAPILLARY: 69 mg/dL (ref 65–99)
GLUCOSE-CAPILLARY: 79 mg/dL (ref 65–99)

## 2016-07-20 LAB — BASIC METABOLIC PANEL
ANION GAP: 15 (ref 5–15)
BUN: 43 mg/dL — ABNORMAL HIGH (ref 6–20)
CALCIUM: 8.3 mg/dL — AB (ref 8.9–10.3)
CO2: 24 mmol/L (ref 22–32)
Chloride: 95 mmol/L — ABNORMAL LOW (ref 101–111)
Creatinine, Ser: 7.88 mg/dL — ABNORMAL HIGH (ref 0.61–1.24)
GFR calc Af Amer: 10 mL/min — ABNORMAL LOW (ref >60)
GFR, EST NON AFRICAN AMERICAN: 8 mL/min — AB (ref >60)
Glucose, Bld: 84 mg/dL (ref 65–99)
Potassium: 3.8 mmol/L (ref 3.5–5.1)
Sodium: 134 mmol/L — ABNORMAL LOW (ref 135–145)

## 2016-07-20 LAB — HSV CULTURE AND TYPING

## 2016-07-20 MED ORDER — EPOETIN ALFA 10000 UNIT/ML IJ SOLN
INTRAMUSCULAR | Status: AC
  Administered 2016-07-20: 8000 [IU] via SUBCUTANEOUS
  Filled 2016-07-20: qty 1

## 2016-07-20 NOTE — Plan of Care (Signed)
Problem: Physical Regulation: Goal: Ability to maintain clinical measurements within normal limits will improve Outcome: Not Progressing Pt continues to experience hypotension. Received NS bolus per order to achieve BP of 94 systolic. Goal: Will remain free from infection Outcome: Not Progressing See pt medical history.  Problem: Tissue Perfusion: Goal: Risk factors for ineffective tissue perfusion will decrease Outcome: Not Progressing Pt refuses VTE.   Problem: Fluid Volume: Goal: Ability to maintain a balanced intake and output will improve Outcome: Progressing Pt on fluid restriction.

## 2016-07-20 NOTE — Progress Notes (Signed)
Hypoglycemic Event  CBG: 69  Treatment: 15 GM carbohydrate snack  Symptoms: None  Follow-up CBG: Time: 0836 CBG Result: 79  Possible Reasons for Event: Unknown  Comments/MD notified: Dr. Edwin Cap, [facility]

## 2016-07-20 NOTE — Procedures (Signed)
   HEMODIALYSIS TREATMENT NOTE:  4 hour heparin-free dialysis completed via left upper arm AVG (16g/antegrade). Goal met: 2 liters removed without interruption in ultrafiltration. All blood was returned and hemostasis was achieved within 15 minutes.  Pt unhappy with lunch tray; states food options are too limited on renal diet and that he doesn't follow renal diet at home.  "My dietitian said to keep doing whatever I'm doing because my numbers look good."  Dr. Jerilee Hoh notified.  Report called to Janace Aris, RN.  Rockwell Alexandria, RN, CDN

## 2016-07-20 NOTE — Progress Notes (Signed)
PROGRESS NOTE    AMEDEE Estrada  BJY:[id number] DOB: [birth year] DOA: 07/17/2016 PCP: [name] ERIC Valentina Lucks, MD     Brief Narrative:  25 y/o man admitted from home on 10/02 with HA, chills and malaise. Has a h/o ESRD on HD and CHF with known EF of 15%. Noted to have pain in his penis with phimosis and purulent drainage. LP without evidence for meningitis. Found to have sepsis on admission. On zosyn. Cx pending.   Assessment & Plan:   Principal Problem:   Sepsis, unspecified organism (HCC) Active Problems:   ESRD on hemodialysis (HCC)   Macrocytic anemia   AIDS (HCC)   Protein-calorie malnutrition, severe (HCC)   Chronic systolic heart failure (HCC)   Inguinal adenopathy   AIDS (acquired immune deficiency syndrome) (HCC)   Hyperbilirubinemia   Sepsis -Sepsis parameters improved. -Fluid resuscitation difficult given CHF and ESRD. -Source remains unclear: CSF negative, UA dirty (but cx negative), BC negative at day 3, urine cx negative. -Need to consider phimosis as source given purulent drainage. -Vancomycin has been discontinued as doubt MRSA as source.  Phimosis -With purulent drainage, pain and no other source of sepsis, need to consider this is a potential source. -Will request GU evaluation.  AIDS/HIV -Unknown CD4 count. -Will request. -Will continue HARRT.  Chronic Systolic CHF -Known EF of 15%. -Is compensated at present.  ESRD -On HD. -Appreciate nephrology's input. -Received HD on 10/5.   DVT prophylaxis: SQ heparin Code Status: full code Family Communication: patient only Disposition Plan: transfer to floor  Consultants:   GU requested  Procedures:   None  Antimicrobials:   Zosyn 10/3-->  Vancomycin 10/3-->10/04   Subjective: Feels improved. Still c/o pain around his penis. Says "the problem is I can't pull the skin back to clean it right".  Objective: Vitals:   07/20/16 1300 07/20/16 1330 07/20/16 1400 07/20/16 1418  BP: (!) 95/57  104/70 92/60 98/62   Pulse: 85 86 94 91  Resp:    16  Temp:    98.3 F (36.8 C)  TempSrc:    Oral  SpO2:      Weight:      Height:        Intake/Output Summary (Last 24 hours) at 07/20/16 1624 Last data filed at 07/20/16 1400  Gross per 24 hour  Intake              120 ml  Output             2000 ml  Net            -1880 ml   Filed Weights   07/19/16 0500 07/20/16 0728 07/20/16 0950  Weight: 53.9 kg (118 lb 13.3 oz) 55.3 kg (121 lb 14.6 oz) 55.3 kg (121 lb 14.6 oz)    Examination:  General exam: Alert, awake, oriented x 3 Respiratory system: Clear to auscultation. Respiratory effort normal. Cardiovascular system:RRR. No murmurs, rubs, gallops. Gastrointestinal system: Abdomen is nondistended, soft and nontender. No organomegaly or masses felt. Normal bowel sounds heard. Central nervous system: Alert and oriented. No focal neurological deficits. Extremities: No C/C/E, +pedal pulses Psychiatry: Judgement and insight appear normal. Mood & affect appropriate.     Data Reviewed: I have personally reviewed following labs and imaging studies  CBC:  Recent Labs Lab 07/17/16 2228 07/18/16 0337 07/19/16 0435 07/20/16 1349  WBC 7.6 13.7* 15.2* 11.5*  NEUTROABS 7.2  --   --   --   HGB 12.1* 12.3* 9.7* 9.6*  HCT 33.8*  35.7* 28.2* 28.6*  MCV 104.0* 102.0* 102.9* 102.5*  PLT 199 165 183 223   Basic Metabolic Panel:  Recent Labs Lab 07/17/16 2228 07/18/16 0337 07/19/16 0435 07/20/16 1349  NA 134* 133* 137 134*  K 3.1* 3.4* 3.5 3.8  CL 95* 99* 100* 95*  CO2 26 23 27 24   GLUCOSE 102* 74 74 84  BUN 45* 44* 28* 43*  CREATININE 9.86* 9.45* 5.80* 7.88*  CALCIUM 8.4* 7.7* 7.7* 8.3*  PHOS  --   --  2.4*  --    GFR: Estimated Creatinine Clearance: 11 mL/min (by C-G formula based on SCr of 7.88 mg/dL (H)). Liver Function Tests:  Recent Labs Lab 07/17/16 2228 07/18/16 0337 07/18/16 0415 07/19/16 0435  AST 32 29  --   --   ALT 13* 11*  --   --   ALKPHOS 125 108   --   --   BILITOT 7.7* 8.7* 8.5*  --   PROT 7.5 7.0  --   --   ALBUMIN 2.3* 2.1*  --  2.1*    Recent Labs Lab 07/17/16 2230  LIPASE 20   No results for input(s): AMMONIA in the last 168 hours. Coagulation Profile: No results for input(s): INR, PROTIME in the last 168 hours. Cardiac Enzymes: No results for input(s): CKTOTAL, CKMB, CKMBINDEX, TROPONINI in the last 168 hours. BNP (last 3 results) No results for input(s): PROBNP in the last 8760 hours. HbA1C: No results for input(s): HGBA1C in the last 72 hours. CBG:  Recent Labs Lab 07/18/16 0744 07/19/16 0749 07/19/16 1631 07/20/16 0729 07/20/16 0836  GLUCAP 91 113* 94 69 79   Lipid Profile: No results for input(s): CHOL, HDL, LDLCALC, TRIG, CHOLHDL, LDLDIRECT in the last 72 hours. Thyroid Function Tests: No results for input(s): TSH, T4TOTAL, FREET4, T3FREE, THYROIDAB in the last 72 hours. Anemia Panel: No results for input(s): VITAMINB12, FOLATE, FERRITIN, TIBC, IRON, RETICCTPCT in the last 72 hours. Urine analysis:    Component Value Date/Time   COLORURINE AMBER (A) 07/18/2016 0123   APPEARANCEUR HAZY (A) 07/18/2016 0123   LABSPEC 1.015 07/18/2016 0123   PHURINE 7.5 07/18/2016 0123   GLUCOSEU 100 (A) 07/18/2016 0123   HGBUR MODERATE (A) 07/18/2016 0123   BILIRUBINUR MODERATE (A) 07/18/2016 0123   KETONESUR NEGATIVE 07/18/2016 0123   PROTEINUR >300 (A) 07/18/2016 0123   UROBILINOGEN 0.2 12/17/2014 0017   NITRITE NEGATIVE 07/18/2016 0123   LEUKOCYTESUR SMALL (A) 07/18/2016 0123   Sepsis Labs: @LABRCNTIP (procalcitonin:4,lacticidven:4)  ) Recent Results (from the past 240 hour(s))  Blood Culture (routine x 2)     Status: None (Preliminary result)   Collection Time: 07/17/16 10:30 PM  Result Value Ref Range Status   Specimen Description BLOOD RIGHT FOREARM DRAWN BY RN  Final   Special Requests BOTTLES DRAWN AEROBIC AND ANAEROBIC 6CC  Final   Culture NO GROWTH 3 DAYS  Final   Report Status PENDING   Incomplete  Blood Culture (routine x 2)     Status: None (Preliminary result)   Collection Time: 07/17/16 10:30 PM  Result Value Ref Range Status   Specimen Description BLOOD RIGHT ANTECUBITAL DRAWN BY RN  Final   Special Requests BOTTLES DRAWN AEROBIC AND ANAEROBIC 6CC  Final   Culture NO GROWTH 3 DAYS  Final   Report Status PENDING  Incomplete  CSF culture     Status: None (Preliminary result)   Collection Time: 07/18/16 12:15 AM  Result Value Ref Range Status   Specimen Description BACK  Final  Special Requests NONE  Final   Gram Stain NO WBC SEEN NO ORGANISMS SEEN CYTOSPIN   Final   Culture   Final    NO GROWTH 2 DAYS [facility]    Report Status PENDING  Incomplete  Urine culture     Status: Abnormal   Collection Time: 07/18/16  1:23 AM  Result Value Ref Range Status   Specimen Description URINE, CLEAN CATCH  Final   Special Requests NONE  Final   Culture MULTIPLE SPECIES PRESENT, SUGGEST RECOLLECTION (A)  Final   Report Status 07/19/2016 FINAL  Final  MRSA PCR Screening     Status: None   Collection Time: 07/18/16  2:00 AM  Result Value Ref Range Status   MRSA by PCR NEGATIVE NEGATIVE Final    Comment:        The GeneXpert MRSA Assay (FDA approved for NASAL specimens only), is one component of a comprehensive MRSA colonization surveillance program. It is not intended to diagnose MRSA infection nor to guide or monitor treatment for MRSA infections.          Radiology Studies: No results found.      Scheduled Meds: . emtricitabine  200 mg Oral Once per day on Tue Thu  . epoetin (EPOGEN/PROCRIT) injection  8,000 Units Subcutaneous Q T,Th,Sa-HD  . etravirine  200 mg Oral Q12H  . heparin  5,000 Units Subcutaneous Q8H  . metoprolol succinate  50 mg Oral Daily  . piperacillin-tazobactam (ZOSYN)  IV  3.375 g Intravenous Q12H  . raltegravir  400 mg Oral BID  . sodium chloride flush  3 mL Intravenous Q12H  . sodium chloride flush  3  mL Intravenous Q12H  . sulfamethoxazole-trimethoprim  0.5 tablet Oral Q M,W,F  . tenofovir  300 mg Oral Weekly   Continuous Infusions:    LOS: 2 days    Time spent: 25 minutes. Greater than 50% of this time was spent in direct contact with the patient coordinating care.     Chaya JanHERNANDEZ ACOSTA,ESTELA, MD [facility] Pager [phone]  If 7PM-7AM, please contact night-coverage [email or web address] Password [email or web address] 07/20/2016, 4:24 PM

## 2016-07-20 NOTE — Progress Notes (Signed)
Subjective: Interval History: has no complaint of nausea or vomiting. Complains of some weakness..  Objective: Vital signs in last 24 hours: Temp:  [97.8 F (36.6 C)-98.4 F (36.9 C)] 98.4 F (36.9 C) (10/05 0950) Pulse Rate:  [83-102] 88 (10/05 1100) Resp:  [16-20] 16 (10/05 0950) BP: (80-102)/(47-63) 93/56 (10/05 1100) SpO2:  [97 %-100 %] 97 % (10/05 0950) Weight:  [55.3 kg (121 lb 14.6 oz)] 55.3 kg (121 lb 14.6 oz) (10/05 0950) Weight change:   Intake/Output from previous day: No intake/output data recorded. Intake/Output this shift: Total I/O In: 120 [P.O.:120] Out: -   General appearance: alert, cooperative and no distress Resp: diminished breath sounds bilaterally Cardio: regular rate and rhythm and systolic murmur: holosystolic 3/6, medium pitch at 2nd left intercostal space GI: soft, non-tender; bowel sounds normal; no masses,  no organomegaly Extremities: edema He has 1+ bilateral edema  Lab Results:  Recent Labs  07/18/16 0337 07/19/16 0435  WBC 13.7* 15.2*  HGB 12.3* 9.7*  HCT 35.7* 28.2*  PLT 165 183   BMET:  Recent Labs  07/18/16 0337 07/19/16 0435  NA 133* 137  K 3.4* 3.5  CL 99* 100*  CO2 23 27  GLUCOSE 74 74  BUN 44* 28*  CREATININE 9.45* 5.80*  CALCIUM 7.7* 7.7*   No results for input(s): PTH in the last 72 hours. Iron Studies: No results for input(s): IRON, TIBC, TRANSFERRIN, FERRITIN in the last 72 hours.  Studies/Results: No results found.  I have reviewed the patient's current medications.  Assessment/Plan: Problem #1 end-stage renal disease: He is status post hemodialysis the day before yesterday. Presently he doesn't have any nausea vomiting. His potassium is low normal. Problem #2 cellulitis/sepsis. Presently his blood pressure tends to be low. Patient is a febrile but his white blood cell count is increasing.. Blood culture is pending. Problem #3 anemia: His hemoglobin has declined Problem #4 hypotension Problem #5 history of  AIDS Problem #6 metabolic bone disease: His calcium in the range Plan: We will make arrangements for patient to get dialysis today We'll try to remove 2 L his systolic blood pressure above 90. We'll change his dialysate temperature is 35C.    LOS: 2 days   Yamna Mackel S 07/20/2016,11:14 AM

## 2016-07-21 LAB — T-HELPER CELLS (CD4) COUNT (NOT AT ARMC)
CD4 T CELL ABS: 100 /uL — AB (ref 400–2700)
CD4 T CELL HELPER: 13 % — AB (ref 33–55)

## 2016-07-21 LAB — CSF CULTURE
CULTURE: NO GROWTH
GRAM STAIN: NONE SEEN

## 2016-07-21 LAB — CSF CULTURE W GRAM STAIN

## 2016-07-21 LAB — GLUCOSE, CAPILLARY: GLUCOSE-CAPILLARY: 82 mg/dL (ref 65–99)

## 2016-07-21 MED ORDER — EPOETIN ALFA 10000 UNIT/ML IJ SOLN
10000.0000 [IU] | INTRAMUSCULAR | Status: DC
  Administered 2016-07-22: 10000 [IU] via SUBCUTANEOUS
  Filled 2016-07-21: qty 1

## 2016-07-21 MED ORDER — CLOTRIMAZOLE 1 % EX CREA
TOPICAL_CREAM | Freq: Two times a day (BID) | CUTANEOUS | Status: DC
  Administered 2016-07-22: 1 via TOPICAL
  Filled 2016-07-21: qty 15

## 2016-07-21 NOTE — Care Management Note (Signed)
Case Management Note  Patient Details  Name: DETAVIOUS BAINBRIDGE MRN: [id number] Date of Birth: [birth year]    Expected Discharge Date:  07/20/16               Expected Discharge Plan:  Home/Self Care  In-House Referral:  NA  Discharge planning Services  CM Consult  Post Acute Care Choice:  NA Choice offered to:  NA  DME Arranged:    DME Agency:     HH Arranged:    HH Agency:     Status of Service:  Completed, signed off  If discussed at [employer] of Stay Meetings, dates discussed:     Additional Comments Pt admitted from home with pneumonia. Pt lives with his father and will return home at discharge. Pt is independent with ADL's. Pt receives dialysis at [facility] in [place]. No CM needs.   Fayth Trefry, Chrystine Oiler, RN 07/21/2016, 1:37 PM

## 2016-07-21 NOTE — Progress Notes (Signed)
Followed up on the Urology request per Dr. Ardyth Harps request spoke with Dr. Scheryl Darter who says he will be in to see the patient tonight around 6 or 7 pm.  Notified Dr. Ardyth Harps via text about the plan for the onsult.

## 2016-07-21 NOTE — Care Management Important Message (Signed)
Important Message  Patient Details  Name: TAEDYN MONTPETIT MRN: [id number] Date of Birth: 11/07/1990   Medicare Important Message Given:  Yes    Alwilda Gilland, Chrystine Oiler, RN 07/21/2016, 1:35 PM

## 2016-07-21 NOTE — Progress Notes (Signed)
PROGRESS NOTE    Dave Estrada  YSA:[id number] DOB: 09-27-1991 DOA: 07/17/2016 PCP: Barbara CowerJASON ERIC Valentina LucksSTOUT, MD     Brief Narrative:  25 y/o man admitted from home on 10/02 with HA, chills and malaise. Has a h/o ESRD on HD and CHF with known EF of 15%. Noted to have pain in his penis with phimosis and purulent drainage. LP without evidence for meningitis. Found to have sepsis on admission. On zosyn. Cx pending.   Assessment & Plan:   Principal Problem:   Sepsis, unspecified organism (HCC) Active Problems:   ESRD on hemodialysis (HCC)   Macrocytic anemia   AIDS (HCC)   Protein-calorie malnutrition, severe (HCC)   Chronic systolic heart failure (HCC)   Inguinal adenopathy   AIDS (acquired immune deficiency syndrome) (HCC)   Hyperbilirubinemia   Sepsis -Sepsis parameters resolved. -Source remains unclear: CSF negative, UA dirty (but cx negative), BC negative at day 4, urine cx negative. -Need to consider phimosis as source given purulent drainage. -Vancomycin has been discontinued as doubt MRSA as source.  Phimosis -With purulent drainage, pain and no other source of sepsis, need to consider this is a potential source. -GU evaluation has been requested (should see today).  AIDS/HIV -Unknown CD4 count. -Will request. -Will continue HARRT.  Chronic Systolic CHF -Known EF of 15%. -Is compensated at present.  ESRD -On HD. -Appreciate nephrology's input. -Received HD on 10/5.   DVT prophylaxis: SQ heparin Code Status: full code Family Communication: patient only Disposition Plan: transfer to floor  Consultants:   GU requested  Procedures:   None  Antimicrobials:   Zosyn 10/3-->  Vancomycin 10/3-->10/04   Subjective: Feels improved. Still c/o pain around his penis. Says "the problem is I can't pull the skin back to clean it right".  Objective: Vitals:   07/20/16 1418 07/20/16 2233 07/21/16 0645 07/21/16 1409  BP: 98/62 (!) 88/59 (!) 97/58 97/67    Pulse: 91 (!) 107 88 91  Resp: 16 20 14 18   Temp: 98.3 F (36.8 C) 99 F (37.2 C) 97.4 F (36.3 C) 97.7 F (36.5 C)  TempSrc: Oral Oral Oral Oral  SpO2:  98% 100% 98%  Weight:   55 kg (121 lb 4.1 oz)   Height:        Intake/Output Summary (Last 24 hours) at 07/21/16 1539 Last data filed at 07/21/16 1300  Gross per 24 hour  Intake              240 ml  Output                0 ml  Net              240 ml   Filed Weights   07/20/16 0728 07/20/16 0950 07/21/16 0645  Weight: 55.3 kg (121 lb 14.6 oz) 55.3 kg (121 lb 14.6 oz) 55 kg (121 lb 4.1 oz)    Examination:  General exam: Alert, awake, oriented x 3 Respiratory system: Clear to auscultation. Respiratory effort normal. Cardiovascular system:RRR. No murmurs, rubs, gallops. Gastrointestinal system: Abdomen is nondistended, soft and nontender. No organomegaly or masses felt. Normal bowel sounds heard. Central nervous system: Alert and oriented. No focal neurological deficits. Extremities: No C/C/E, +pedal pulses Psychiatry: Judgement and insight appear normal. Mood & affect appropriate.     Data Reviewed: I have personally reviewed following labs and imaging studies  CBC:  Recent Labs Lab 07/17/16 2228 07/18/16 0337 07/19/16 0435 07/20/16 1349  WBC 7.6 13.7* 15.2* 11.5*  NEUTROABS  7.2  --   --   --   HGB 12.1* 12.3* 9.7* 9.6*  HCT 33.8* 35.7* 28.2* 28.6*  MCV 104.0* 102.0* 102.9* 102.5*  PLT 199 165 183 223   Basic Metabolic Panel:  Recent Labs Lab 07/17/16 2228 07/18/16 0337 07/19/16 0435 07/20/16 1349  NA 134* 133* 137 134*  K 3.1* 3.4* 3.5 3.8  CL 95* 99* 100* 95*  CO2 26 23 27 24   GLUCOSE 102* 74 74 84  BUN 45* 44* 28* 43*  CREATININE 9.86* 9.45* 5.80* 7.88*  CALCIUM 8.4* 7.7* 7.7* 8.3*  PHOS  --   --  2.4*  --    GFR: Estimated Creatinine Clearance: 11 mL/min (by C-G formula based on SCr of 7.88 mg/dL (H)). Liver Function Tests:  Recent Labs Lab 07/17/16 2228 07/18/16 0337 07/18/16 0415  07/19/16 0435  AST 32 29  --   --   ALT 13* 11*  --   --   ALKPHOS 125 108  --   --   BILITOT 7.7* 8.7* 8.5*  --   PROT 7.5 7.0  --   --   ALBUMIN 2.3* 2.1*  --  2.1*    Recent Labs Lab 07/17/16 2230  LIPASE 20   No results for input(s): AMMONIA in the last 168 hours. Coagulation Profile: No results for input(s): INR, PROTIME in the last 168 hours. Cardiac Enzymes: No results for input(s): CKTOTAL, CKMB, CKMBINDEX, TROPONINI in the last 168 hours. BNP (last 3 results) No results for input(s): PROBNP in the last 8760 hours. HbA1C: No results for input(s): HGBA1C in the last 72 hours. CBG:  Recent Labs Lab 07/19/16 0749 07/19/16 1631 07/20/16 0729 07/20/16 0836 07/21/16 0750  GLUCAP 113* 94 69 79 82   Lipid Profile: No results for input(s): CHOL, HDL, LDLCALC, TRIG, CHOLHDL, LDLDIRECT in the last 72 hours. Thyroid Function Tests: No results for input(s): TSH, T4TOTAL, FREET4, T3FREE, THYROIDAB in the last 72 hours. Anemia Panel: No results for input(s): VITAMINB12, FOLATE, FERRITIN, TIBC, IRON, RETICCTPCT in the last 72 hours. Urine analysis:    Component Value Date/Time   COLORURINE AMBER (A) 07/18/2016 0123   APPEARANCEUR HAZY (A) 07/18/2016 0123   LABSPEC 1.015 07/18/2016 0123   PHURINE 7.5 07/18/2016 0123   GLUCOSEU 100 (A) 07/18/2016 0123   HGBUR MODERATE (A) 07/18/2016 0123   BILIRUBINUR MODERATE (A) 07/18/2016 0123   KETONESUR NEGATIVE 07/18/2016 0123   PROTEINUR >300 (A) 07/18/2016 0123   UROBILINOGEN 0.2 12/17/2014 0017   NITRITE NEGATIVE 07/18/2016 0123   LEUKOCYTESUR SMALL (A) 07/18/2016 0123   Sepsis Labs: @LABRCNTIP (procalcitonin:4,lacticidven:4)  ) Recent Results (from the past 240 hour(s))  Blood Culture (routine x 2)     Status: None (Preliminary result)   Collection Time: 07/17/16 10:30 PM  Result Value Ref Range Status   Specimen Description BLOOD RIGHT FOREARM DRAWN BY RN  Final   Special Requests BOTTLES DRAWN AEROBIC AND ANAEROBIC  6CC  Final   Culture NO GROWTH 4 DAYS  Final   Report Status PENDING  Incomplete  Blood Culture (routine x 2)     Status: None (Preliminary result)   Collection Time: 07/17/16 10:30 PM  Result Value Ref Range Status   Specimen Description BLOOD RIGHT ANTECUBITAL DRAWN BY RN  Final   Special Requests BOTTLES DRAWN AEROBIC AND ANAEROBIC 6CC  Final   Culture NO GROWTH 4 DAYS  Final   Report Status PENDING  Incomplete  CSF culture     Status: None   Collection  Time: 07/18/16 12:15 AM  Result Value Ref Range Status   Specimen Description BACK  Final   Special Requests NONE  Final   Gram Stain NO WBC SEEN NO ORGANISMS SEEN CYTOSPIN   Final   Culture   Final    NO GROWTH 3 DAYS Performed at [facility]    Report Status 07/21/2016 FINAL  Final  Hsv Culture And Typing     Status: None   Collection Time: 07/18/16 12:15 AM  Result Value Ref Range Status   HSV Culture/Type Comment  Final    Comment: (NOTE) Negative No Herpes simplex virus isolated. Performed At: [facility] [address] [place], [place] [id number] Mila Homer MD WU:[id number]    Source of Sample BACK  Final  Urine culture     Status: Abnormal   Collection Time: 07/18/16  1:23 AM  Result Value Ref Range Status   Specimen Description URINE, CLEAN CATCH  Final   Special Requests NONE  Final   Culture MULTIPLE SPECIES PRESENT, SUGGEST RECOLLECTION (A)  Final   Report Status 07/19/2016 FINAL  Final  MRSA PCR Screening     Status: None   Collection Time: 07/18/16  2:00 AM  Result Value Ref Range Status   MRSA by PCR NEGATIVE NEGATIVE Final    Comment:        The GeneXpert MRSA Assay (FDA approved for NASAL specimens only), is one component of a comprehensive MRSA colonization surveillance program. It is not intended to diagnose MRSA infection nor to guide or monitor treatment for MRSA infections.          Radiology Studies: No results found.      Scheduled Meds: .  emtricitabine  200 mg Oral Once per day on Tue Thu  . [START ON 07/22/2016] epoetin (EPOGEN/PROCRIT) injection  10,000 Units Subcutaneous Q T,Th,Sa-HD  . epoetin (EPOGEN/PROCRIT) injection  8,000 Units Subcutaneous Q T,Th,Sa-HD  . etravirine  200 mg Oral Q12H  . heparin  5,000 Units Subcutaneous Q8H  . metoprolol succinate  50 mg Oral Daily  . piperacillin-tazobactam (ZOSYN)  IV  3.375 g Intravenous Q12H  . raltegravir  400 mg Oral BID  . sodium chloride flush  3 mL Intravenous Q12H  . sodium chloride flush  3 mL Intravenous Q12H  . sulfamethoxazole-trimethoprim  0.5 tablet Oral Q M,W,F  . tenofovir  300 mg Oral Weekly   Continuous Infusions:    LOS: 3 days    Time spent: 25 minutes. Greater than 50% of this time was spent in direct contact with the patient coordinating care.     Chaya Jan, MD [facility] Pager [phone]  If 7PM-7AM, please contact night-coverage [email or web address] Password [email or web address] 07/21/2016, 3:39 PM

## 2016-07-21 NOTE — Progress Notes (Signed)
Subjective: Interval History: Patient states that he is feeling slightly better. He feels weak. Is wondering when urologist is going to come and see him. Presently he denies any nausea or vomiting.  Objective: Vital signs in last 24 hours: Temp:  [97.4 F (36.3 C)-99 F (37.2 C)] 97.4 F (36.3 C) (10/06 0645) Pulse Rate:  [83-107] 88 (10/06 0645) Resp:  [14-20] 14 (10/06 0645) BP: (85-108)/(49-70) 97/58 (10/06 0645) SpO2:  [97 %-100 %] 100 % (10/06 0645) Weight:  [55 kg (121 lb 4.1 oz)-55.3 kg (121 lb 14.6 oz)] 55 kg (121 lb 4.1 oz) (10/06 0645) Weight change:   Intake/Output from previous day: 10/05 0701 - 10/06 0700 In: 120 [P.O.:120] Out: 2000  Intake/Output this shift: No intake/output data recorded.  General appearance: alert, cooperative and no distress Resp: diminished breath sounds bilaterally Cardio: regular rate and rhythm and systolic murmur: holosystolic 3/6, medium pitch at 2nd left intercostal space GI: soft, non-tender; bowel sounds normal; no masses,  no organomegaly Extremities: edema He has 1+ bilateral edema  Lab Results:  Recent Labs  07/19/16 0435 07/20/16 1349  WBC 15.2* 11.5*  HGB 9.7* 9.6*  HCT 28.2* 28.6*  PLT 183 223   BMET:   Recent Labs  07/19/16 0435 07/20/16 1349  NA 137 134*  K 3.5 3.8  CL 100* 95*  CO2 27 24  GLUCOSE 74 84  BUN 28* 43*  CREATININE 5.80* 7.88*  CALCIUM 7.7* 8.3*   No results for input(s): PTH in the last 72 hours. Iron Studies: No results for input(s): IRON, TIBC, TRANSFERRIN, FERRITIN in the last 72 hours.  Studies/Results: No results found.  I have reviewed the patient's current medications.  Assessment/Plan: Problem #1 end-stage renal disease: He is status post hemodialysis the day yesterday. Presently he doesn't have any nausea vomiting. His potassium is normal. Were able to remove about 2 L yesterday. Problem #2 cellulitis/sepsis. Presently his blood pressure tends to be low. Patient is a febrile but  his white blood cell count is increasing.. Blood culture no growth. Urine with multiple organisms he is possibly contaminant. Problem #3 anemia: His hemoglobin has declined Problem #4 hypotension: His blood pressure seems to be low normal. He is asymptomatic. Problem #5 history of AIDS Problem #6 metabolic bone disease: His calcium in the range but his phosphorus is slightly low. Patient is not on a binder. Plan: Patient does require dialysis. His next dialysis would be tomorrow. We'll check his CBC and basic metabolic panel in a.m.   LOS: 3 days   [place] Shannahan S 07/21/2016,9:49 AM

## 2016-07-21 NOTE — Progress Notes (Signed)
Pharmacy Antibiotic Note  Dave Estrada is a 25 y.o. male admitted on 07/17/2016 with sepsis.  Pharmacy has been consulted for  Zosyn dosing.  Plan: Zosyn 3.375gm IV q12h EID(4-hour infusion) F/U cultures Monitor V/S and labs  Height: 5\' 5"  (165.1 cm) Weight: 121 lb 4.1 oz (55 kg) IBW/kg (Calculated) : 61.5  Temp (24hrs), Avg:98.2 F (36.8 C), Min:97.4 F (36.3 C), Max:99 F (37.2 C)   Recent Labs Lab 07/17/16 2228 07/17/16 2229 07/17/16 2236 07/18/16 0050 07/18/16 0337 07/19/16 0435 07/20/16 1349  WBC 7.6  --   --   --  13.7* 15.2* 11.5*  CREATININE 9.86*  --   --   --  9.45* 5.80* 7.88*  LATICACIDVEN  --  3.3* 3.77* 2.3* 2.2*  --   --     Estimated Creatinine Clearance: 11 mL/min (by C-G formula based on SCr of 7.88 mg/dL (H)).    Allergies  Allergen Reactions  . Bee Venom Anaphylaxis  . Other Anaphylaxis    mushrooms  . Aspirin Other (See Comments)    Reaction is unknown   Antimicrobials this admission: Vancomycin 10/2 >> 10/4 Zosyn 10/2 >>   Dose adjustments this admission: n/a  Microbiology results: 10/2 BCx: pending 10/3 CSF cx: pending. Gram stain no organisms seen 10/3  UCx: multiple species   10/3 MRSA PCR: negative  Thank you for allowing pharmacy to be a part of this patient's care.  Valrie Hart, PharmD Clinical Pharmacist Pager:  [phone] 07/21/2016    07/21/2016 11:24 AM

## 2016-07-22 DIAGNOSIS — N471 Phimosis: Secondary | ICD-10-CM

## 2016-07-22 DIAGNOSIS — N481 Balanitis: Secondary | ICD-10-CM

## 2016-07-22 DIAGNOSIS — N4889 Other specified disorders of penis: Secondary | ICD-10-CM

## 2016-07-22 LAB — CULTURE, BLOOD (ROUTINE X 2)
Culture: NO GROWTH
Culture: NO GROWTH

## 2016-07-22 LAB — GLUCOSE, CAPILLARY: GLUCOSE-CAPILLARY: 96 mg/dL (ref 65–99)

## 2016-07-22 MED ORDER — SODIUM CHLORIDE 0.9 % IV SOLN: 100.0000 mL | INTRAVENOUS | Status: DC | PRN

## 2016-07-22 MED ORDER — EPOETIN ALFA 10000 UNIT/ML IJ SOLN
INTRAMUSCULAR | Status: AC
  Administered 2016-07-22: 10000 [IU] via SUBCUTANEOUS
  Filled 2016-07-22: qty 1

## 2016-07-22 MED ORDER — HEPARIN SODIUM (PORCINE) 1000 UNIT/ML DIALYSIS
1000.0000 [IU] | INTRAMUSCULAR | Status: DC | PRN
  Filled 2016-07-22: qty 1

## 2016-07-22 MED ORDER — ALTEPLASE 2 MG IJ SOLR
2.0000 mg | Freq: Once | INTRAMUSCULAR | Status: DC | PRN
  Filled 2016-07-22: qty 2

## 2016-07-22 MED ORDER — CLOTRIMAZOLE 1 % EX CREA: TOPICAL_CREAM | Freq: Two times a day (BID) | CUTANEOUS | 0 refills | Status: DC

## 2016-07-22 MED ORDER — SULFAMETHOXAZOLE-TRIMETHOPRIM 400-80 MG PO TABS: 1.0000 | ORAL_TABLET | Freq: Two times a day (BID) | ORAL | 0 refills | Status: DC

## 2016-07-22 MED ORDER — CLOTRIMAZOLE 1 % EX CREA
TOPICAL_CREAM | CUTANEOUS | Status: AC
  Filled 2016-07-22: qty 15

## 2016-07-22 NOTE — Progress Notes (Signed)
Subjective: Interval History: Patient states that he is feeling better. He doesn't offer any complaints.  Objective: Vital signs in last 24 hours: Temp:  [97.5 F (36.4 C)-97.8 F (36.6 C)] 97.8 F (36.6 C) (10/07 0649) Pulse Rate:  [91-97] 97 (10/07 0649) Resp:  [16-18] 16 (10/07 0649) BP: (91-101)/(57-67) 101/66 (10/07 0649) SpO2:  [98 %-100 %] 100 % (10/07 0649) Weight:  [55.3 kg (122 lb)] 55.3 kg (122 lb) (10/07 0649) Weight change: 0.039 kg (1.4 oz)  Intake/Output from previous day: 10/06 0701 - 10/07 0700 In: 95093 [P.O.:360; I.V.:72446; IV Piggyback:300] Out: -  Intake/Output this shift: No intake/output data recorded.  Generally patient is alert and in no apparent distress Chest is clear to auscultation Heart exam revealed regular rate and rhythm 3/ 6 systolic ejection murmur Extremities he has trace edema  Lab Results:  Recent Labs  07/20/16 1349  WBC 11.5*  HGB 9.6*  HCT 28.6*  PLT 223   BMET:   Recent Labs  07/20/16 1349  NA 134*  K 3.8  CL 95*  CO2 24  GLUCOSE 84  BUN 43*  CREATININE 7.88*  CALCIUM 8.3*   No results for input(s): PTH in the last 72 hours. Iron Studies: No results for input(s): IRON, TIBC, TRANSFERRIN, FERRITIN in the last 72 hours.  Studies/Results: No results found.  I have reviewed the patient's current medications.  Assessment/Plan: Problem #1 end-stage renal disease: He is status post hemodialysis On Thursday. Presently he doesn't have any uremic signs and symptoms. His potassium is normal Problem #2 cellulitis/sepsis. Presently his blood pressure tends to be low. Patient is a febrile but his white blood cell count is improving. Problem #3 anemia: His hemoglobin is stable. Patient is on Epogen. Problem #4 hypotension: His blood pressure has improved. Problem #5 history of AIDS Problem #6 metabolic bone disease: His calcium in the range Plan: 1] We will make arrangements for patient to get dialysis today          2]  We'll try to remove 21/2 L his systolic blood pressure above 90.          3] We'll change his dialysate temperature is 35C.           4] if patient is going to be discharged to go back to his regular schedule to his regular dialysis center.   LOS: 4 days   Shailen Thielen S 07/22/2016,10:07 AM

## 2016-07-22 NOTE — Discharge Summary (Signed)
Physician Discharge Summary  Dave Estrada ZOX:[id number] DOB: [birth year] DOA: 07/17/2016  PCP: [name] [name], MD  Admit date: 07/17/2016 Discharge date: 07/22/2016  Time spent: 45 minutes  Recommendations for Outpatient Follow-up:  -Will be discharged home today. -Will need follow up with urology in 4 weeks for discussion of circumcision.   Discharge Diagnoses:  Principal Problem:   Sepsis, unspecified organism [facility]) Active Problems:   ESRD on hemodialysis (HCC)   Macrocytic anemia   AIDS (HCC)   Protein-calorie malnutrition, severe (HCC)   Chronic systolic heart failure (HCC)   Inguinal adenopathy   AIDS (acquired immune deficiency syndrome) (HCC)   Hyperbilirubinemia   Discharge Condition: Stable and improved  Filed Weights   07/20/16 0950 07/21/16 0645 07/22/16 0649  Weight: 55.3 kg (121 lb 14.6 oz) 55 kg (121 lb 4.1 oz) 55.3 kg (122 lb)    History of present illness:  As per Dr. Antionette Char on 10/3: Dave Estrada is a 25 y.o. male with medical history significant for HIV/AIDS, end-stage renal disease on hemodialysis, and chronic systolic CHF who presents the emergency department for evaluation of headache, chills, and malaise. Patient reports a general sense of malaise going back several months, but describes acute worsening over the past day. This has been associated with shaking chills and a frontal headache. Patient denies change in vision or hearing, photophobia, or neck pain or stiffness. There has been no recent sick contacts or long distance travel. Patient endorses some mild dyspnea, but denies significant cough. Patient also denies chest pain or palpitations. He endorses some loose stools, but denies abdominal pain or vomiting. Patient describes some yellow foul-smelling drainage from his penis for a few days as well as swollen nodules in the groin which are draining pus. He is followed for his HIV at [facility] with a viral load of 371 and CD4 count of  176 in July of this year. He is on Bactrim for PCP prophylaxis.  ED Course: Upon arrival to the ED, patient is found to be febrile to 38.4 C, saturating well on room air, tachycardic in the 130s, and with stable blood pressure. EKG demonstrates a sinus tachycardia with rate 127 and right axis deviation. Chest x-ray is negative for acute cardiopulmonary disease. CMP is notable formild total bilirubin of 7.7, mild hyponatremia and hypokalemia and BUN of 45 with serum creatinine of 9.86.CBC features a stable macrocytic anemia with hemoglobin of 12.1. Lactic acid returns elevated at value of 3.3. Patient was given 1 L of normal saline in the emergency department blood cultures were obtained, and CSF was obtained for Gram stain and culture. CSF Gram stain is not suggestive of bacterial infection;there are no neutrophils observed. Urinalysis remains pending. Patient was treated with Zosyn and vancomycin empirically and provided symptomatic care with Tylenol and fentanyl. Tachycardia improved with the fluid and lactate has trended down. Patient will be admitted to the stepdown unit for ongoing evaluation and management of sepsis.    Hospital Course:   Sepsis -Sepsis parameters resolved. -Source remains unclear: CSF negative, UA dirty (but cx negative), BC negative at day 5, urine cx negative. -Need to consider phimosis as source given purulent drainage. -Received vanc/zosyn initially.  Phimosis/Balanitis -With purulent drainage, pain and no other source of sepsis, need to consider this as a potential source. -GU evaluation has been requested. -They have recommended SS bactrim for 14 days and clobetasone cream.  AIDS/HIV -Unknown CD4 count. -CD4 count pending. -Will continue HARRT.  Chronic Systolic CHF -  Known EF of 15%. -Is compensated at present.  ESRD  -On HD. -Appreciate nephrology's input. -Received HD on schedule while hospitalized.  Procedures:  None    Consultations:  Urology  Discharge Instructions  Discharge Instructions    Diet - low sodium heart healthy    Complete by:  As directed    Increase activity slowly    Complete by:  As directed        Medication List    TAKE these medications   albuterol 108 (90 Base) MCG/ACT inhaler Commonly known as:  PROVENTIL HFA;VENTOLIN HFA Inhale 1-2 puffs into the lungs every 6 (six) hours as needed for wheezing or shortness of breath.   clotrimazole 1 % cream Commonly known as:  LOTRIMIN Apply topically 2 (two) times daily.   emtricitabine 200 MG capsule Commonly known as:  EMTRIVA Take 200 mg by mouth 2 (two) times a week. Taken after dialysis on Tuesday and Thursday   etravirine 100 MG tablet Commonly known as:  INTELENCE Take 200 mg by mouth every 12 (twelve) hours.   metoprolol succinate 50 MG 24 hr tablet Commonly known as:  TOPROL-XL Take one and one half tab twice a day Take with or immediately following a meal. What changed:  how much to take  how to take this  when to take this  additional instructions   raltegravir 400 MG tablet Commonly known as:  ISENTRESS Take 400 mg by mouth 2 (two) times daily.   sulfamethoxazole-trimethoprim 400-80 MG tablet Commonly known as:  BACTRIM,SEPTRA Take 1 tablet by mouth 3 (three) times a week. To take after dialysis days What changed:  Another medication with the same name was added. Make sure you understand how and when to take each.   sulfamethoxazole-trimethoprim 400-80 MG tablet Commonly known as:  BACTRIM Take 1 tablet by mouth 2 (two) times daily. What changed:  You were already taking a medication with the same name, and this prescription was added. Make sure you understand how and when to take each.   tenofovir 300 MG tablet Commonly known as:  VIREAD Take 300 mg by mouth once a week. To be taken on Tuesday after dialysis      Allergies  Allergen Reactions  . Bee Venom Anaphylaxis  . Other Anaphylaxis     mushrooms  . Aspirin Other (See Comments)    Reaction is unknown   Follow-up Information    JASON [name], MD. Schedule an appointment as soon as possible for a visit in 2 week(s).   Specialty:  Infectious Diseases Contact information: [address] [place] [address] [phone]            The results of significant diagnostics from this hospitalization (including imaging, microbiology, ancillary and laboratory) are listed below for reference.    Significant Diagnostic Studies: Dg Chest 2 View  Result Date: 07/17/2016 CLINICAL DATA:  Worsening cough and fever for 1 week. History of smoker, HIV, end-stage renal disease on dialysis, asthma. EXAM: CHEST  2 VIEW COMPARISON:  06/10/2016 FINDINGS: Slightly shallow inspiration. Normal heart size and pulmonary vascularity. No focal airspace disease or consolidation in the lungs. No blunting of costophrenic angles. No pneumothorax. Mediastinal contours appear intact. IMPRESSION: No active cardiopulmonary disease. Electronically Signed   By: Burman Nieves M.D.   On: 07/17/2016 23:25   US Abdomen Limited Ruq  Result Date: 07/18/2016 CLINICAL DATA:  Sepsis, hyperbilirubinemia, history of HIV, end-stage renal disease, CHF. EXAM: US ABDOMEN LIMITED - RIGHT UPPER  QUADRANT COMPARISON:  Abdominal ultrasound of December 02, 2015 FINDINGS: Gallbladder: The gallbladder wall remains thickened at 8 mm. There are echogenic non mobile non shadowing foci adherent to the gallbladder lumen measuring up to 4 mm. There is a small amount of pericholecystic fluid. Common bile duct: Diameter: 2 mm Liver: The hepatic echotexture is mildly increased diffusely. There is no focal mass nor ductal dilation. The surface contour of the liver is smooth where visualized. IMPRESSION: Chronic gallbladder wall thickening similar to that seen previously in the setting of ascites. No ascites is observed today. Multiple gallbladder polyps. No positive  sonographic Murphy's sign. Increased hepatic echotexture likely reflects fatty infiltrative change. No focal mass or ductal dilation. Electronically Signed   By: David  [place] M.D.   On: 07/18/2016 10:10    Microbiology: Recent Results (from the past 240 hour(s))  Blood Culture (routine x 2)     Status: None   Collection Time: 07/17/16 10:30 PM  Result Value Ref Range Status   Specimen Description BLOOD RIGHT FOREARM DRAWN BY RN  Final   Special Requests BOTTLES DRAWN AEROBIC AND ANAEROBIC 6CC  Final   Culture NO GROWTH 5 DAYS  Final   Report Status 07/22/2016 FINAL  Final  Blood Culture (routine x 2)     Status: None   Collection Time: 07/17/16 10:30 PM  Result Value Ref Range Status   Specimen Description BLOOD RIGHT ANTECUBITAL DRAWN BY RN  Final   Special Requests BOTTLES DRAWN AEROBIC AND ANAEROBIC 6CC  Final   Culture NO GROWTH 5 DAYS  Final   Report Status 07/22/2016 FINAL  Final  CSF culture     Status: None   Collection Time: 07/18/16 12:15 AM  Result Value Ref Range Status   Specimen Description BACK  Final   Special Requests NONE  Final   Gram Stain NO WBC SEEN NO ORGANISMS SEEN CYTOSPIN   Final   Culture   Final    NO GROWTH 3 DAYS Performed at [facility]    Report Status 07/21/2016 FINAL  Final  Hsv Culture And Typing     Status: None   Collection Time: 07/18/16 12:15 AM  Result Value Ref Range Status   HSV Culture/Type Comment  Final    Comment: (NOTE) Negative No Herpes simplex virus isolated. Performed At: [facility] [address] [place], [place] [id number] Mila Homer MD [id number]    Source of Sample BACK  Final  Urine culture     Status: Abnormal   Collection Time: 07/18/16  1:23 AM  Result Value Ref Range Status   Specimen Description URINE, CLEAN CATCH  Final   Special Requests NONE  Final   Culture MULTIPLE SPECIES PRESENT, SUGGEST RECOLLECTION (A)  Final   Report Status 07/19/2016 FINAL  Final  MRSA PCR Screening      Status: None   Collection Time: 07/18/16  2:00 AM  Result Value Ref Range Status   MRSA by PCR NEGATIVE NEGATIVE Final    Comment:        The GeneXpert MRSA Assay (FDA approved for NASAL specimens only), is one component of a comprehensive MRSA colonization surveillance program. It is not intended to diagnose MRSA infection nor to guide or monitor treatment for MRSA infections.      Labs: Basic Metabolic Panel:  Recent Labs Lab 07/17/16 2228 07/18/16 0337 07/19/16 0435 07/20/16 1349  NA 134* 133* 137 134*  K 3.1* 3.4* 3.5 3.8  CL 95* 99* 100* 95*  CO2 26 23 27 24   GLUCOSE 102* 74 74 84  BUN 45* 44* 28* 43*  CREATININE 9.86* 9.45* 5.80* 7.88*  CALCIUM 8.4* 7.7* 7.7* 8.3*  PHOS  --   --  2.4*  --    Liver Function Tests:  Recent Labs Lab 07/17/16 2228 07/18/16 0337 07/18/16 0415 07/19/16 0435  AST 32 29  --   --   ALT 13* 11*  --   --   ALKPHOS 125 108  --   --   BILITOT 7.7* 8.7* 8.5*  --   PROT 7.5 7.0  --   --   ALBUMIN 2.3* 2.1*  --  2.1*    Recent Labs Lab 07/17/16 2230  LIPASE 20   No results for input(s): AMMONIA in the last 168 hours. CBC:  Recent Labs Lab 07/17/16 2228 07/18/16 0337 07/19/16 0435 07/20/16 1349  WBC 7.6 13.7* 15.2* 11.5*  NEUTROABS 7.2  --   --   --   HGB 12.1* 12.3* 9.7* 9.6*  HCT 33.8* 35.7* 28.2* 28.6*  MCV 104.0* 102.0* 102.9* 102.5*  PLT 199 165 183 223   Cardiac Enzymes: No results for input(s): CKTOTAL, CKMB, CKMBINDEX, TROPONINI in the last 168 hours. BNP: BNP (last 3 results)  Recent Labs  09/11/15 0150  BNP >4,500.0*    ProBNP (last 3 results) No results for input(s): PROBNP in the last 8760 hours.  CBG:  Recent Labs Lab 07/19/16 1631 07/20/16 0729 07/20/16 0836 07/21/16 0750 07/22/16 0739  GLUCAP 94 69 79 82 96       Signed:  HERNANDEZ ACOSTA,ESTELA  [facility] Pager: [phone] 07/22/2016, 3:36 PM

## 2016-07-22 NOTE — Consult Note (Signed)
Urology Consult  Referring physician: Dr. Isaac Bliss Reason for referral: phimosis, purulent drainage from glans  Chief Complaint: penile pain  History of Present Illness: [name] is a 25yo with ESRD, HIV who was admitted with fever, leukocytosis and a concern for sepsis. He was noted to have purulent drainage from his penis during this admission. Per the patient has has been having this discharge for 3-4 months but 1 week ago he developed sharp, constant, moderate nonraditing penile pain with worsening discharge. He has a hx of phimosis. No exacerbating events. No alleviaitng events. No recent sexual contacts. No other associated signs/symptoms. He urinated 3-4x per day with no dysuria or hematuria. STD screen pending  Past Medical History:  Diagnosis Date  . A-V fistula ([place])    Placed on 10/27/2014 at [facility]; for future HD use.   . Asthma   . ESRD (end stage renal disease) ([place])   . Heart attack   . HIV (human immunodeficiency virus infection) ([place])   . Staphylococcus aureus bacteremia with sepsis ([place]) 09/09/2014  . Systolic dysfunction, left ventricle 12/20/2014   EF 20%    Past Surgical History:  Procedure Laterality Date  . INSERTION OF DIALYSIS CATHETER Left 09/15/2014   Procedure: INSERTION OF DIALYSIS CATHETER;  Surgeon: Serafina Mitchell, MD;  Location: [place];  Service: Vascular;  Laterality: Left;  . PORT A CATH REVISION    . TEE WITHOUT CARDIOVERSION N/A 09/14/2014   Procedure: TRANSESOPHAGEAL ECHOCARDIOGRAM (TEE);  Surgeon: Larey Dresser, MD;  Location[facility] END[facility]COPY;  Service: Cardiovascular;  Laterality: N/A;  will be at [facility] by mon    Medications: I have reviewed the patient's current medications. Allergies:  Allergies  Allergen Reactions  . Bee Venom Anaphylaxis  . Other Anaphylaxis    mushrooms  . Aspirin Other (See Comments)    Reaction is unknown    Family History  Problem Relation Age of Onset  . Seizures Father    Social History:  reports that he  has been smoking Cigarettes.  He started smoking about 13 years ago. He has a 5.50 pack-year smoking history. He has never used smokeless tobacco. He reports that he uses drugs, including Marijuana. He reports that he does not drink alcohol.  Review of Systems  Gastrointestinal: Positive for nausea.  Neurological: Positive for weakness.  All other systems reviewed and are negative.   Physical Exam:  Vital signs in last 24 hours: Temp:  [97.5 F (36.4 C)-97.9 F (36.6 C)] 97.9 F (36.6 C) (10/07 1348) Pulse Rate:  [94-98] 98 (10/07 1348) Resp:  [16-18] 18 (10/07 1348) BP: (91-101)/(57-66) 98/66 (10/07 1348) SpO2:  [100 %] 100 % (10/07 1348) Weight:  [55.3 kg (122 lb)] 55.3 kg (122 lb) (10/07 3149) Physical Exam  Constitutional: He is oriented to person, place, and time. He appears well-developed and well-nourished.  HENT:  Head: Normocephalic and atraumatic.  Eyes: EOM are normal. Pupils are equal, round, and reactive to light.  Neck: Normal range of motion. No thyromegaly present.  Cardiovascular: Normal rate and regular rhythm.   Respiratory: Effort normal. No respiratory distress.  GI: Soft. He exhibits no distension. Hernia confirmed negative in the right inguinal area and confirmed negative in the left inguinal area.  Genitourinary: Testes normal. Right testis shows no mass, no swelling and no tenderness. Right testis is descended. Cremasteric reflex is not absent on the right side. Left testis shows no mass, no swelling and no tenderness. Left testis is descended. Cremasteric reflex is not absent on  the left side. Uncircumcised. Phimosis and penile erythema present. Discharge found.  Musculoskeletal: Normal range of motion. He exhibits no edema.  Lymphadenopathy:       Right: No inguinal adenopathy present.       Left: No inguinal adenopathy present.  Neurological: He is alert and oriented to person, place, and time.  Skin: Skin is warm and dry.  Psychiatric: He has a normal  mood and affect. His behavior is normal. Judgment and thought content normal.    Laboratory Data:  Results for orders placed or performed during the hospital encounter of 07/17/16 (from the past 72 hour(s))  Glucose, capillary     Status: None   Collection Time: 07/19/16  4:31 PM  Result Value Ref Range   Glucose-Capillary 94 65 - 99 mg/dL  Glucose, capillary     Status: None   Collection Time: 07/20/16  7:29 AM  Result Value Ref Range   Glucose-Capillary 69 65 - 99 mg/dL  Glucose, capillary     Status: None   Collection Time: 07/20/16  8:36 AM  Result Value Ref Range   Glucose-Capillary 79 65 - 99 mg/dL  Basic metabolic panel     Status: Abnormal   Collection Time: 07/20/16  1:49 PM  Result Value Ref Range   Sodium 134 (L) 135 - 145 mmol/L   Potassium 3.8 3.5 - 5.1 mmol/L   Chloride 95 (L) 101 - 111 mmol/L   CO2 24 22 - 32 mmol/L   Glucose, Bld 84 65 - 99 mg/dL   BUN 43 (H) 6 - 20 mg/dL   Creatinine, Ser 7.88 (H) 0.61 - 1.24 mg/dL   Calcium 8.3 (L) 8.9 - 10.3 mg/dL   GFR calc non Af Amer 8 (L) >60 mL/min   GFR calc Af Amer 10 (L) >60 mL/min    Comment: (NOTE) The eGFR has been calculated using the CKD EPI equation. This calculation has not been validated in all clinical situations. eGFR's persistently <60 mL/min signify possible Chronic Kidney Disease.    Anion gap 15 5 - 15  CBC     Status: Abnormal   Collection Time: 07/20/16  1:49 PM  Result Value Ref Range   WBC 11.5 (H) 4.0 - 10.5 K/uL   RBC 2.79 (L) 4.22 - 5.81 MIL/uL   Hemoglobin 9.6 (L) 13.0 - 17.0 g/dL   HCT 28.6 (L) 39.0 - 52.0 %   MCV 102.5 (H) 78.0 - 100.0 fL   MCH 34.4 (H) 26.0 - 34.0 pg   MCHC 33.6 30.0 - 36.0 g/dL   RDW 16.8 (H) 11.5 - 15.5 %   Platelets 223 150 - 400 K/uL  T-helper cells (CD4) count (not at [facility])     Status: Abnormal   Collection Time: 07/20/16  1:49 PM  Result Value Ref Range   CD4 T Cell Abs 100 (L) 400 - 2,700 /uL   CD4 % Helper T Cell 13 (L) 33 - 55 %    Comment: Performed at  [facility]  Glucose, capillary     Status: None   Collection Time: 07/21/16  7:50 AM  Result Value Ref Range   Glucose-Capillary 82 65 - 99 mg/dL  Glucose, capillary     Status: None   Collection Time: 07/22/16  7:39 AM  Result Value Ref Range   Glucose-Capillary 96 65 - 99 mg/dL   Comment 1 Notify RN    Comment 2 Document in Chart    Recent Results (from the past 240  hour(s))  Blood Culture (routine x 2)     Status: None   Collection Time: 07/17/16 10:30 PM  Result Value Ref Range Status   Specimen Description BLOOD RIGHT FOREARM DRAWN BY RN  Final   Special Requests BOTTLES DRAWN AEROBIC AND ANAEROBIC 6CC  Final   Culture NO GROWTH 5 DAYS  Final   Report Status 07/22/2016 FINAL  Final  Blood Culture (routine x 2)     Status: None   Collection Time: 07/17/16 10:30 PM  Result Value Ref Range Status   Specimen Description BLOOD RIGHT ANTECUBITAL DRAWN BY RN  Final   Special Requests BOTTLES DRAWN AEROBIC AND ANAEROBIC 6CC  Final   Culture NO GROWTH 5 DAYS  Final   Report Status 07/22/2016 FINAL  Final  CSF culture     Status: None   Collection Time: 07/18/16 12:15 AM  Result Value Ref Range Status   Specimen Description BACK  Final   Special Requests NONE  Final   Gram Stain NO WBC SEEN NO ORGANISMS SEEN CYTOSPIN   Final   Culture   Final    NO GROWTH 3 DAYS Performed at [facility]    Report Status 07/21/2016 FINAL  Final  Hsv Culture And Typing     Status: None   Collection Time: 07/18/16 12:15 AM  Result Value Ref Range Status   HSV Culture/Type Comment  Final    Comment: (NOTE) Negative No Herpes simplex virus isolated. Performed At: [facility] [place], [place] [id number] Lindon Romp MD NI:[id number]    Source of Sample BACK  Final  Urine culture     Status: Abnormal   Collection Time: 07/18/16  1:23 AM  Result Value Ref Range Status   Specimen Description URINE, CLEAN CATCH  Final   Special Requests  NONE  Final   Culture MULTIPLE SPECIES PRESENT, SUGGEST RECOLLECTION (A)  Final   Report Status 07/19/2016 FINAL  Final  MRSA PCR Screening     Status: None   Collection Time: 07/18/16  2:00 AM  Result Value Ref Range Status   MRSA by PCR NEGATIVE NEGATIVE Final    Comment:        The GeneXpert MRSA Assay (FDA approved for NASAL specimens only), is one component of a comprehensive MRSA colonization surveillance program. It is not intended to diagnose MRSA infection nor to guide or monitor treatment for MRSA infections.    Creatinine:  Recent Labs  07/17/16 2228 07/18/16 0337 07/19/16 0435 07/20/16 1349  CREATININE 9.86* 9.45* 5.80* 7.88*   Baseline Creatinine: NA  Impression/Assessment:  25yo with phimosis and balanitis  Plan:  1. Balanitis: please treat with SS BActrim for 14 days as well as clotrimazole-betamethosone cream BID for 21 days 2. Phimosis: Pt will followup in 4-6 weeks after discharge for discussion of circumcision  Nicolette Bang 07/22/2016, 2:10 PM

## 2016-07-23 NOTE — Progress Notes (Signed)
Received call from [facility], patient completed run, 1.5 liters removed, 2.5 ordered, patient complained of stomach upset, Last BP 99/55 HR 103, Only medication administered epogen.  Patient returned to room, prepared for discharge, instructions given and signed, IV removed, declined HS meds, will take at home. Patient requested letter for school of inpatient status from 10/2-10/8. Signed by Dr. Antionette Char, Dad picked him up from emergency room, no complaints of stomach upset at discharge, taken to car via wheelchair.

## 2016-07-25 DIAGNOSIS — D696 Thrombocytopenia, unspecified: Secondary | ICD-10-CM | POA: Diagnosis not present

## 2016-07-25 DIAGNOSIS — D509 Iron deficiency anemia, unspecified: Secondary | ICD-10-CM | POA: Diagnosis not present

## 2016-07-25 DIAGNOSIS — I10 Essential (primary) hypertension: Secondary | ICD-10-CM | POA: Diagnosis not present

## 2016-07-25 DIAGNOSIS — N2581 Secondary hyperparathyroidism of renal origin: Secondary | ICD-10-CM | POA: Diagnosis not present

## 2016-07-25 DIAGNOSIS — D631 Anemia in chronic kidney disease: Secondary | ICD-10-CM | POA: Diagnosis not present

## 2016-07-25 DIAGNOSIS — N186 End stage renal disease: Secondary | ICD-10-CM | POA: Diagnosis not present

## 2016-07-25 LAB — CHLAMYDIA PANEL SERUM
C. Pneumoniae IgM Serum: <1:20 {titer}
C. Trachomatis IgG Serum: <1:64 {titer}

## 2016-07-27 DIAGNOSIS — I10 Essential (primary) hypertension: Secondary | ICD-10-CM | POA: Diagnosis not present

## 2016-07-27 DIAGNOSIS — E8770 Fluid overload, unspecified: Secondary | ICD-10-CM | POA: Diagnosis present

## 2016-07-27 DIAGNOSIS — Z992 Dependence on renal dialysis: Secondary | ICD-10-CM | POA: Diagnosis not present

## 2016-07-27 DIAGNOSIS — N2581 Secondary hyperparathyroidism of renal origin: Secondary | ICD-10-CM | POA: Diagnosis not present

## 2016-07-27 DIAGNOSIS — D509 Iron deficiency anemia, unspecified: Secondary | ICD-10-CM | POA: Diagnosis not present

## 2016-07-27 DIAGNOSIS — E8779 Other fluid overload: Secondary | ICD-10-CM | POA: Diagnosis not present

## 2016-07-27 DIAGNOSIS — B20 Human immunodeficiency virus [HIV] disease: Secondary | ICD-10-CM | POA: Diagnosis present

## 2016-07-27 DIAGNOSIS — I420 Dilated cardiomyopathy: Secondary | ICD-10-CM | POA: Diagnosis present

## 2016-07-27 DIAGNOSIS — I313 Pericardial effusion (noninflammatory): Secondary | ICD-10-CM | POA: Diagnosis present

## 2016-07-27 DIAGNOSIS — D631 Anemia in chronic kidney disease: Secondary | ICD-10-CM | POA: Diagnosis not present

## 2016-07-27 DIAGNOSIS — J45909 Unspecified asthma, uncomplicated: Secondary | ICD-10-CM | POA: Diagnosis present

## 2016-07-27 DIAGNOSIS — R069 Unspecified abnormalities of breathing: Secondary | ICD-10-CM | POA: Diagnosis not present

## 2016-07-27 DIAGNOSIS — I120 Hypertensive chronic kidney disease with stage 5 chronic kidney disease or end stage renal disease: Secondary | ICD-10-CM | POA: Diagnosis present

## 2016-07-27 DIAGNOSIS — I319 Disease of pericardium, unspecified: Secondary | ICD-10-CM | POA: Diagnosis not present

## 2016-07-27 DIAGNOSIS — R609 Edema, unspecified: Secondary | ICD-10-CM | POA: Diagnosis not present

## 2016-07-27 DIAGNOSIS — R012 Other cardiac sounds: Secondary | ICD-10-CM | POA: Diagnosis not present

## 2016-07-27 DIAGNOSIS — E876 Hypokalemia: Secondary | ICD-10-CM | POA: Diagnosis not present

## 2016-07-27 DIAGNOSIS — R0602 Shortness of breath: Secondary | ICD-10-CM | POA: Diagnosis not present

## 2016-07-27 DIAGNOSIS — I340 Nonrheumatic mitral (valve) insufficiency: Secondary | ICD-10-CM | POA: Diagnosis present

## 2016-07-27 DIAGNOSIS — N186 End stage renal disease: Secondary | ICD-10-CM | POA: Diagnosis present

## 2016-07-31 DIAGNOSIS — B20 Human immunodeficiency virus [HIV] disease: Secondary | ICD-10-CM | POA: Diagnosis not present

## 2016-07-31 DIAGNOSIS — Z79899 Other long term (current) drug therapy: Secondary | ICD-10-CM | POA: Diagnosis not present

## 2016-08-01 DIAGNOSIS — I10 Essential (primary) hypertension: Secondary | ICD-10-CM | POA: Diagnosis not present

## 2016-08-01 DIAGNOSIS — D631 Anemia in chronic kidney disease: Secondary | ICD-10-CM | POA: Diagnosis not present

## 2016-08-01 DIAGNOSIS — N186 End stage renal disease: Secondary | ICD-10-CM | POA: Diagnosis not present

## 2016-08-01 DIAGNOSIS — D509 Iron deficiency anemia, unspecified: Secondary | ICD-10-CM | POA: Diagnosis not present

## 2016-08-01 DIAGNOSIS — N2581 Secondary hyperparathyroidism of renal origin: Secondary | ICD-10-CM | POA: Diagnosis not present

## 2016-08-03 DIAGNOSIS — N186 End stage renal disease: Secondary | ICD-10-CM | POA: Diagnosis not present

## 2016-08-03 DIAGNOSIS — N2581 Secondary hyperparathyroidism of renal origin: Secondary | ICD-10-CM | POA: Diagnosis not present

## 2016-08-03 DIAGNOSIS — D631 Anemia in chronic kidney disease: Secondary | ICD-10-CM | POA: Diagnosis not present

## 2016-08-03 DIAGNOSIS — I10 Essential (primary) hypertension: Secondary | ICD-10-CM | POA: Diagnosis not present

## 2016-08-03 DIAGNOSIS — D509 Iron deficiency anemia, unspecified: Secondary | ICD-10-CM | POA: Diagnosis not present

## 2016-08-10 DIAGNOSIS — N186 End stage renal disease: Secondary | ICD-10-CM | POA: Diagnosis not present

## 2016-08-10 DIAGNOSIS — D509 Iron deficiency anemia, unspecified: Secondary | ICD-10-CM | POA: Diagnosis not present

## 2016-08-10 DIAGNOSIS — I10 Essential (primary) hypertension: Secondary | ICD-10-CM | POA: Diagnosis not present

## 2016-08-10 DIAGNOSIS — N2581 Secondary hyperparathyroidism of renal origin: Secondary | ICD-10-CM | POA: Diagnosis not present

## 2016-08-10 DIAGNOSIS — D631 Anemia in chronic kidney disease: Secondary | ICD-10-CM | POA: Diagnosis not present

## 2016-08-12 DIAGNOSIS — D631 Anemia in chronic kidney disease: Secondary | ICD-10-CM | POA: Diagnosis not present

## 2016-08-12 DIAGNOSIS — D509 Iron deficiency anemia, unspecified: Secondary | ICD-10-CM | POA: Diagnosis not present

## 2016-08-12 DIAGNOSIS — I10 Essential (primary) hypertension: Secondary | ICD-10-CM | POA: Diagnosis not present

## 2016-08-12 DIAGNOSIS — N186 End stage renal disease: Secondary | ICD-10-CM | POA: Diagnosis not present

## 2016-08-12 DIAGNOSIS — N2581 Secondary hyperparathyroidism of renal origin: Secondary | ICD-10-CM | POA: Diagnosis not present

## 2016-08-15 DIAGNOSIS — D631 Anemia in chronic kidney disease: Secondary | ICD-10-CM | POA: Diagnosis not present

## 2016-08-15 DIAGNOSIS — Z992 Dependence on renal dialysis: Secondary | ICD-10-CM | POA: Diagnosis not present

## 2016-08-15 DIAGNOSIS — D509 Iron deficiency anemia, unspecified: Secondary | ICD-10-CM | POA: Diagnosis not present

## 2016-08-15 DIAGNOSIS — N2581 Secondary hyperparathyroidism of renal origin: Secondary | ICD-10-CM | POA: Diagnosis not present

## 2016-08-15 DIAGNOSIS — N186 End stage renal disease: Secondary | ICD-10-CM | POA: Diagnosis not present

## 2016-08-15 DIAGNOSIS — I10 Essential (primary) hypertension: Secondary | ICD-10-CM | POA: Diagnosis not present

## 2016-08-19 DIAGNOSIS — D631 Anemia in chronic kidney disease: Secondary | ICD-10-CM | POA: Diagnosis not present

## 2016-08-19 DIAGNOSIS — L299 Pruritus, unspecified: Secondary | ICD-10-CM | POA: Diagnosis not present

## 2016-08-19 DIAGNOSIS — D696 Thrombocytopenia, unspecified: Secondary | ICD-10-CM | POA: Diagnosis not present

## 2016-08-19 DIAGNOSIS — I10 Essential (primary) hypertension: Secondary | ICD-10-CM | POA: Diagnosis not present

## 2016-08-19 DIAGNOSIS — N186 End stage renal disease: Secondary | ICD-10-CM | POA: Diagnosis not present

## 2016-08-19 DIAGNOSIS — D509 Iron deficiency anemia, unspecified: Secondary | ICD-10-CM | POA: Diagnosis not present

## 2016-08-19 DIAGNOSIS — N2581 Secondary hyperparathyroidism of renal origin: Secondary | ICD-10-CM | POA: Diagnosis not present

## 2016-08-23 DIAGNOSIS — D696 Thrombocytopenia, unspecified: Secondary | ICD-10-CM | POA: Diagnosis not present

## 2016-08-23 DIAGNOSIS — N2581 Secondary hyperparathyroidism of renal origin: Secondary | ICD-10-CM | POA: Diagnosis not present

## 2016-08-23 DIAGNOSIS — D631 Anemia in chronic kidney disease: Secondary | ICD-10-CM | POA: Diagnosis not present

## 2016-08-23 DIAGNOSIS — L299 Pruritus, unspecified: Secondary | ICD-10-CM | POA: Diagnosis not present

## 2016-08-23 DIAGNOSIS — N186 End stage renal disease: Secondary | ICD-10-CM | POA: Diagnosis not present

## 2016-08-26 DIAGNOSIS — N2581 Secondary hyperparathyroidism of renal origin: Secondary | ICD-10-CM | POA: Diagnosis not present

## 2016-08-26 DIAGNOSIS — D696 Thrombocytopenia, unspecified: Secondary | ICD-10-CM | POA: Diagnosis not present

## 2016-08-26 DIAGNOSIS — L299 Pruritus, unspecified: Secondary | ICD-10-CM | POA: Diagnosis not present

## 2016-08-26 DIAGNOSIS — D631 Anemia in chronic kidney disease: Secondary | ICD-10-CM | POA: Diagnosis not present

## 2016-08-26 DIAGNOSIS — N186 End stage renal disease: Secondary | ICD-10-CM | POA: Diagnosis not present

## 2016-08-28 DIAGNOSIS — D631 Anemia in chronic kidney disease: Secondary | ICD-10-CM | POA: Diagnosis not present

## 2016-08-28 DIAGNOSIS — N2581 Secondary hyperparathyroidism of renal origin: Secondary | ICD-10-CM | POA: Diagnosis not present

## 2016-08-28 DIAGNOSIS — D696 Thrombocytopenia, unspecified: Secondary | ICD-10-CM | POA: Diagnosis not present

## 2016-08-28 DIAGNOSIS — L299 Pruritus, unspecified: Secondary | ICD-10-CM | POA: Diagnosis not present

## 2016-08-28 DIAGNOSIS — N186 End stage renal disease: Secondary | ICD-10-CM | POA: Diagnosis not present

## 2016-08-31 DIAGNOSIS — D696 Thrombocytopenia, unspecified: Secondary | ICD-10-CM | POA: Diagnosis not present

## 2016-08-31 DIAGNOSIS — D631 Anemia in chronic kidney disease: Secondary | ICD-10-CM | POA: Diagnosis not present

## 2016-08-31 DIAGNOSIS — L299 Pruritus, unspecified: Secondary | ICD-10-CM | POA: Diagnosis not present

## 2016-08-31 DIAGNOSIS — N2581 Secondary hyperparathyroidism of renal origin: Secondary | ICD-10-CM | POA: Diagnosis not present

## 2016-08-31 DIAGNOSIS — N186 End stage renal disease: Secondary | ICD-10-CM | POA: Diagnosis not present

## 2016-09-02 DIAGNOSIS — D696 Thrombocytopenia, unspecified: Secondary | ICD-10-CM | POA: Diagnosis not present

## 2016-09-02 DIAGNOSIS — L299 Pruritus, unspecified: Secondary | ICD-10-CM | POA: Diagnosis not present

## 2016-09-02 DIAGNOSIS — N2581 Secondary hyperparathyroidism of renal origin: Secondary | ICD-10-CM | POA: Diagnosis not present

## 2016-09-02 DIAGNOSIS — D631 Anemia in chronic kidney disease: Secondary | ICD-10-CM | POA: Diagnosis not present

## 2016-09-02 DIAGNOSIS — N186 End stage renal disease: Secondary | ICD-10-CM | POA: Diagnosis not present

## 2016-09-04 ENCOUNTER — Ambulatory Visit (INDEPENDENT_AMBULATORY_CARE_PROVIDER_SITE_OTHER): Payer: Medicare Other | Admitting: Cardiovascular Disease

## 2016-09-04 ENCOUNTER — Encounter: Payer: Self-pay | Admitting: Cardiovascular Disease

## 2016-09-04 VITALS — BP 90/62 | HR 60 | Ht 65.0 in | Wt 123.0 lb

## 2016-09-04 DIAGNOSIS — Z992 Dependence on renal dialysis: Secondary | ICD-10-CM | POA: Diagnosis not present

## 2016-09-04 DIAGNOSIS — N186 End stage renal disease: Secondary | ICD-10-CM

## 2016-09-04 DIAGNOSIS — I5042 Chronic combined systolic (congestive) and diastolic (congestive) heart failure: Secondary | ICD-10-CM

## 2016-09-04 DIAGNOSIS — B20 Human immunodeficiency virus [HIV] disease: Secondary | ICD-10-CM

## 2016-09-04 DIAGNOSIS — I428 Other cardiomyopathies: Secondary | ICD-10-CM | POA: Diagnosis not present

## 2016-09-04 NOTE — Patient Instructions (Addendum)
Your physician wants you to follow-up in:  1 year Dr Reggy Eye will receive a reminder letter in the mail two months in advance. If you don't receive a letter, please call our office to schedule the follow-up appointment.    STOP Coreg   Thank you for choosing [facility] !

## 2016-09-04 NOTE — Progress Notes (Signed)
SUBJECTIVE: The patient presents for routine follow-up. He has a history of chronic systolic heart failure. He was hospitalized for sepsis of unclear etiology in October 2017.  Echo 01/19/16: - Mild to moderate LV chamber dilatation with LVEF 15-20%. There is   diffuse hypokinesis overall. Incidentally noted transverse LV   apical false tendon. Prominent LV (and RV) trabeculation,   suggestive of but not clearly diagnostic of non-compaction.   Restrictive diastolic filling pattern with evidence of increased   LV filling pressures. Spontaneous echocontrast consistent with   low cardiac output. Severe biatrial enlargement. Mildly thickened   mitral leaflets with moderate to severe eccentric mitral   regurgitation. Dilated RV with moderately to severely reduced   contraction. Moderate tricuspid regurgitation with PASP 47 mmHg   and elevated CVP. Small circumferential pericardial effusion.  He has a history of HIV/AIDS and end-stage renal disease on dialysis.  He was evaluated in the advanced heart failure clinic in March 2017 and was deemed to have a poor prognosis overall as he is not a candidate for VAD or transplant.  Has been taking both Toprol-XL and Coreg.   Review of Systems: As per "subjective", otherwise negative.  Allergies  Allergen Reactions  . Bee Venom Anaphylaxis  . Other Anaphylaxis    mushrooms  . Aspirin Other (See Comments)    Reaction is unknown    Current Outpatient Prescriptions  Medication Sig Dispense Refill  . albuterol (PROVENTIL HFA;VENTOLIN HFA) 108 (90 BASE) MCG/ACT inhaler Inhale 1-2 puffs into the lungs every 6 (six) hours as needed for wheezing or shortness of breath.    . clotrimazole (LOTRIMIN) 1 % cream Apply topically 2 (two) times daily. 30 g 0  . emtricitabine (EMTRIVA) 200 MG capsule Take 200 mg by mouth 2 (two) times a week. Taken after dialysis on Tuesday and Thursday    . etravirine (INTELENCE) 100 MG tablet Take 200 mg by mouth  every 12 (twelve) hours.    . metoprolol succinate (TOPROL-XL) 50 MG 24 hr tablet Take 50 mg by mouth daily. Take with or immediately following a meal.    . raltegravir (ISENTRESS) 400 MG tablet Take 400 mg by mouth 2 (two) times daily.    Marland Kitchen sulfamethoxazole-trimethoprim (BACTRIM,SEPTRA) 400-80 MG tablet Take 1 tablet by mouth 3 (three) times a week. To take after dialysis days    . tenofovir (VIREAD) 300 MG tablet Take 300 mg by mouth once a week. To be taken on Tuesday after dialysis     No current facility-administered medications for this visit.     Past Medical History:  Diagnosis Date  . A-V fistula (HCC)    Placed on 10/27/2014 at [facility]; for future HD use.   . Asthma   . ESRD (end stage renal disease) (HCC)   . Heart attack   . HIV (human immunodeficiency virus infection) (HCC)   . Staphylococcus aureus bacteremia with sepsis (HCC) 09/09/2014  . Systolic dysfunction, left ventricle 12/20/2014   EF 20%     Past Surgical History:  Procedure Laterality Date  . INSERTION OF DIALYSIS CATHETER Left 09/15/2014   Procedure: INSERTION OF DIALYSIS CATHETER;  Surgeon: Nada Libman, MD;  Location: [facility] OR;  Service: Vascular;  Laterality: Left;  . PORT A CATH REVISION    . TEE WITHOUT CARDIOVERSION N/A 09/14/2014   Procedure: TRANSESOPHAGEAL ECHOCARDIOGRAM (TEE);  Surgeon: Laurey Morale, MD;  Location: [facility] ENDOSCOPY;  Service: Cardiovascular;  Laterality: N/A;  will be at [facility] by mon  Social History   Social History  . Marital status: Single    Spouse name: N/A  . Number of children: N/A  . Years of education: N/A   Occupational History  . Not on file.   Social History Main Topics  . Smoking status: Current Every Day Smoker    Packs/day: 0.50    Years: 11.00    Types: Cigarettes    Start date: 01/28/2003  . Smokeless tobacco: Never Used  . Alcohol use No  . Drug use:     Types: Marijuana     Comment: 4-5 days ago  . Sexual activity: Not Currently   Other Topics Concern    . Not on file   Social History Narrative  . No narrative on file     Vitals:   09/04/16 1351  BP: 90/62  Pulse: 60  SpO2: 95%  Weight: 123 lb (55.8 kg)  Height: 5\' 5"  (1.651 m)    PHYSICAL EXAM General: NAD HEENT: Normal. Neck: No JVD, no thyromegaly. Lungs: Diminished throughout, no rales. CV: Tachycardic, regular rhythm, normal S1/S2, no S3/S4, no murmur. No pretibial or periankle edema.    Abdomen: Soft, nontender, no distention.  Neurologic: Alert and oriented.  Psych: Normal affect.    ECG: Most recent ECG reviewed.      ASSESSMENT AND PLAN: 1. 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 Continue Toprol-XL 75 mg bid. Instructed to stop simultaneously taking Coreg. Unable to tolerate ACEI due to hypotension. Tachycardia indicates a poor prognosis. Volume managed with hemodialysis. Given propensity for infection, likely a poor candidate for ICD.  2. Chronic HIV disease: Followed by [facility], on four drug therapy.  3. ESRD, on hemodialysis: Followed by Dr. Kristian CoveyBefekadu.   Dispo: f/u 1 year.   Prentice DockerSuresh Noam Karaffa, M.D., F.A.C.C.

## 2016-09-06 DIAGNOSIS — D631 Anemia in chronic kidney disease: Secondary | ICD-10-CM | POA: Diagnosis not present

## 2016-09-06 DIAGNOSIS — L299 Pruritus, unspecified: Secondary | ICD-10-CM | POA: Diagnosis not present

## 2016-09-06 DIAGNOSIS — N2581 Secondary hyperparathyroidism of renal origin: Secondary | ICD-10-CM | POA: Diagnosis not present

## 2016-09-06 DIAGNOSIS — D696 Thrombocytopenia, unspecified: Secondary | ICD-10-CM | POA: Diagnosis not present

## 2016-09-06 DIAGNOSIS — N186 End stage renal disease: Secondary | ICD-10-CM | POA: Diagnosis not present

## 2016-09-09 DIAGNOSIS — R002 Palpitations: Secondary | ICD-10-CM | POA: Diagnosis not present

## 2016-09-09 DIAGNOSIS — N186 End stage renal disease: Secondary | ICD-10-CM | POA: Diagnosis not present

## 2016-09-09 DIAGNOSIS — Z21 Asymptomatic human immunodeficiency virus [HIV] infection status: Secondary | ICD-10-CM | POA: Diagnosis not present

## 2016-09-09 DIAGNOSIS — R112 Nausea with vomiting, unspecified: Secondary | ICD-10-CM | POA: Diagnosis not present

## 2016-09-09 DIAGNOSIS — F17200 Nicotine dependence, unspecified, uncomplicated: Secondary | ICD-10-CM | POA: Diagnosis not present

## 2016-09-09 DIAGNOSIS — R109 Unspecified abdominal pain: Secondary | ICD-10-CM | POA: Diagnosis not present

## 2016-09-09 DIAGNOSIS — Z888 Allergy status to other drugs, medicaments and biological substances status: Secondary | ICD-10-CM | POA: Diagnosis not present

## 2016-09-09 DIAGNOSIS — F259 Schizoaffective disorder, unspecified: Secondary | ICD-10-CM | POA: Diagnosis not present

## 2016-09-09 DIAGNOSIS — R05 Cough: Secondary | ICD-10-CM | POA: Diagnosis not present

## 2016-09-09 DIAGNOSIS — J45909 Unspecified asthma, uncomplicated: Secondary | ICD-10-CM | POA: Diagnosis not present

## 2016-09-09 DIAGNOSIS — Z992 Dependence on renal dialysis: Secondary | ICD-10-CM | POA: Diagnosis not present

## 2016-09-09 DIAGNOSIS — I5020 Unspecified systolic (congestive) heart failure: Secondary | ICD-10-CM | POA: Diagnosis not present

## 2016-09-09 DIAGNOSIS — E875 Hyperkalemia: Secondary | ICD-10-CM | POA: Diagnosis not present

## 2016-09-09 DIAGNOSIS — I132 Hypertensive heart and chronic kidney disease with heart failure and with stage 5 chronic kidney disease, or end stage renal disease: Secondary | ICD-10-CM | POA: Diagnosis not present

## 2016-09-09 DIAGNOSIS — I252 Old myocardial infarction: Secondary | ICD-10-CM | POA: Diagnosis not present

## 2016-09-09 DIAGNOSIS — N189 Chronic kidney disease, unspecified: Secondary | ICD-10-CM | POA: Diagnosis not present

## 2016-09-10 DIAGNOSIS — N186 End stage renal disease: Secondary | ICD-10-CM | POA: Diagnosis not present

## 2016-09-10 DIAGNOSIS — E875 Hyperkalemia: Secondary | ICD-10-CM | POA: Diagnosis not present

## 2016-09-10 DIAGNOSIS — R002 Palpitations: Secondary | ICD-10-CM | POA: Diagnosis not present

## 2016-09-14 DIAGNOSIS — D696 Thrombocytopenia, unspecified: Secondary | ICD-10-CM | POA: Diagnosis not present

## 2016-09-14 DIAGNOSIS — Z992 Dependence on renal dialysis: Secondary | ICD-10-CM | POA: Diagnosis not present

## 2016-09-14 DIAGNOSIS — D631 Anemia in chronic kidney disease: Secondary | ICD-10-CM | POA: Diagnosis not present

## 2016-09-14 DIAGNOSIS — N186 End stage renal disease: Secondary | ICD-10-CM | POA: Diagnosis not present

## 2016-09-14 DIAGNOSIS — L299 Pruritus, unspecified: Secondary | ICD-10-CM | POA: Diagnosis not present

## 2016-09-14 DIAGNOSIS — N2581 Secondary hyperparathyroidism of renal origin: Secondary | ICD-10-CM | POA: Diagnosis not present

## 2016-09-19 DIAGNOSIS — D696 Thrombocytopenia, unspecified: Secondary | ICD-10-CM | POA: Diagnosis not present

## 2016-09-19 DIAGNOSIS — I10 Essential (primary) hypertension: Secondary | ICD-10-CM | POA: Diagnosis not present

## 2016-09-19 DIAGNOSIS — N186 End stage renal disease: Secondary | ICD-10-CM | POA: Diagnosis not present

## 2016-09-19 DIAGNOSIS — N2581 Secondary hyperparathyroidism of renal origin: Secondary | ICD-10-CM | POA: Diagnosis not present

## 2016-09-19 DIAGNOSIS — D631 Anemia in chronic kidney disease: Secondary | ICD-10-CM | POA: Diagnosis not present

## 2016-09-19 DIAGNOSIS — D509 Iron deficiency anemia, unspecified: Secondary | ICD-10-CM | POA: Diagnosis not present

## 2016-09-19 DIAGNOSIS — L299 Pruritus, unspecified: Secondary | ICD-10-CM | POA: Diagnosis not present

## 2016-09-21 DIAGNOSIS — I252 Old myocardial infarction: Secondary | ICD-10-CM | POA: Diagnosis not present

## 2016-09-21 DIAGNOSIS — Z886 Allergy status to analgesic agent status: Secondary | ICD-10-CM | POA: Diagnosis not present

## 2016-09-21 DIAGNOSIS — Z91030 Bee allergy status: Secondary | ICD-10-CM | POA: Diagnosis not present

## 2016-09-21 DIAGNOSIS — K610 Anal abscess: Secondary | ICD-10-CM | POA: Diagnosis not present

## 2016-09-21 DIAGNOSIS — D631 Anemia in chronic kidney disease: Secondary | ICD-10-CM | POA: Diagnosis not present

## 2016-09-21 DIAGNOSIS — L299 Pruritus, unspecified: Secondary | ICD-10-CM | POA: Diagnosis not present

## 2016-09-21 DIAGNOSIS — N2581 Secondary hyperparathyroidism of renal origin: Secondary | ICD-10-CM | POA: Diagnosis not present

## 2016-09-21 DIAGNOSIS — N186 End stage renal disease: Secondary | ICD-10-CM | POA: Diagnosis not present

## 2016-09-21 DIAGNOSIS — I10 Essential (primary) hypertension: Secondary | ICD-10-CM | POA: Diagnosis not present

## 2016-09-21 DIAGNOSIS — K612 Anorectal abscess: Secondary | ICD-10-CM | POA: Diagnosis not present

## 2016-09-21 DIAGNOSIS — Z91018 Allergy to other foods: Secondary | ICD-10-CM | POA: Diagnosis not present

## 2016-09-21 DIAGNOSIS — L0231 Cutaneous abscess of buttock: Secondary | ICD-10-CM | POA: Diagnosis not present

## 2016-09-21 DIAGNOSIS — K611 Rectal abscess: Secondary | ICD-10-CM | POA: Diagnosis not present

## 2016-09-21 DIAGNOSIS — B20 Human immunodeficiency virus [HIV] disease: Secondary | ICD-10-CM | POA: Diagnosis not present

## 2016-09-24 DIAGNOSIS — F17210 Nicotine dependence, cigarettes, uncomplicated: Secondary | ICD-10-CM | POA: Diagnosis not present

## 2016-09-24 DIAGNOSIS — B20 Human immunodeficiency virus [HIV] disease: Secondary | ICD-10-CM | POA: Diagnosis not present

## 2016-09-24 DIAGNOSIS — Z91018 Allergy to other foods: Secondary | ICD-10-CM | POA: Diagnosis not present

## 2016-09-24 DIAGNOSIS — Z91030 Bee allergy status: Secondary | ICD-10-CM | POA: Diagnosis not present

## 2016-09-24 DIAGNOSIS — Z992 Dependence on renal dialysis: Secondary | ICD-10-CM | POA: Diagnosis not present

## 2016-09-24 DIAGNOSIS — Z4800 Encounter for change or removal of nonsurgical wound dressing: Secondary | ICD-10-CM | POA: Diagnosis not present

## 2016-09-24 DIAGNOSIS — I120 Hypertensive chronic kidney disease with stage 5 chronic kidney disease or end stage renal disease: Secondary | ICD-10-CM | POA: Diagnosis not present

## 2016-09-24 DIAGNOSIS — Z886 Allergy status to analgesic agent status: Secondary | ICD-10-CM | POA: Diagnosis not present

## 2016-09-24 DIAGNOSIS — N186 End stage renal disease: Secondary | ICD-10-CM | POA: Diagnosis not present

## 2016-09-30 DIAGNOSIS — N2581 Secondary hyperparathyroidism of renal origin: Secondary | ICD-10-CM | POA: Diagnosis not present

## 2016-09-30 DIAGNOSIS — I10 Essential (primary) hypertension: Secondary | ICD-10-CM | POA: Diagnosis not present

## 2016-09-30 DIAGNOSIS — N186 End stage renal disease: Secondary | ICD-10-CM | POA: Diagnosis not present

## 2016-09-30 DIAGNOSIS — L299 Pruritus, unspecified: Secondary | ICD-10-CM | POA: Diagnosis not present

## 2016-09-30 DIAGNOSIS — D631 Anemia in chronic kidney disease: Secondary | ICD-10-CM | POA: Diagnosis not present

## 2016-10-05 DIAGNOSIS — L299 Pruritus, unspecified: Secondary | ICD-10-CM | POA: Diagnosis not present

## 2016-10-05 DIAGNOSIS — N2581 Secondary hyperparathyroidism of renal origin: Secondary | ICD-10-CM | POA: Diagnosis not present

## 2016-10-05 DIAGNOSIS — I10 Essential (primary) hypertension: Secondary | ICD-10-CM | POA: Diagnosis not present

## 2016-10-05 DIAGNOSIS — N186 End stage renal disease: Secondary | ICD-10-CM | POA: Diagnosis not present

## 2016-10-05 DIAGNOSIS — D631 Anemia in chronic kidney disease: Secondary | ICD-10-CM | POA: Diagnosis not present

## 2016-10-12 DIAGNOSIS — L299 Pruritus, unspecified: Secondary | ICD-10-CM | POA: Diagnosis not present

## 2016-10-12 DIAGNOSIS — I10 Essential (primary) hypertension: Secondary | ICD-10-CM | POA: Diagnosis not present

## 2016-10-12 DIAGNOSIS — N2581 Secondary hyperparathyroidism of renal origin: Secondary | ICD-10-CM | POA: Diagnosis not present

## 2016-10-12 DIAGNOSIS — N186 End stage renal disease: Secondary | ICD-10-CM | POA: Diagnosis not present

## 2016-10-12 DIAGNOSIS — D631 Anemia in chronic kidney disease: Secondary | ICD-10-CM | POA: Diagnosis not present

## 2016-10-14 DIAGNOSIS — I10 Essential (primary) hypertension: Secondary | ICD-10-CM | POA: Diagnosis not present

## 2016-10-14 DIAGNOSIS — L299 Pruritus, unspecified: Secondary | ICD-10-CM | POA: Diagnosis not present

## 2016-10-14 DIAGNOSIS — N186 End stage renal disease: Secondary | ICD-10-CM | POA: Diagnosis not present

## 2016-10-14 DIAGNOSIS — D631 Anemia in chronic kidney disease: Secondary | ICD-10-CM | POA: Diagnosis not present

## 2016-10-14 DIAGNOSIS — N2581 Secondary hyperparathyroidism of renal origin: Secondary | ICD-10-CM | POA: Diagnosis not present

## 2016-10-15 DIAGNOSIS — Z992 Dependence on renal dialysis: Secondary | ICD-10-CM | POA: Diagnosis not present

## 2016-10-15 DIAGNOSIS — N186 End stage renal disease: Secondary | ICD-10-CM | POA: Diagnosis not present

## 2016-10-19 DIAGNOSIS — I10 Essential (primary) hypertension: Secondary | ICD-10-CM | POA: Diagnosis not present

## 2016-10-19 DIAGNOSIS — N186 End stage renal disease: Secondary | ICD-10-CM | POA: Diagnosis not present

## 2016-10-19 DIAGNOSIS — D631 Anemia in chronic kidney disease: Secondary | ICD-10-CM | POA: Diagnosis not present

## 2016-10-19 DIAGNOSIS — D509 Iron deficiency anemia, unspecified: Secondary | ICD-10-CM | POA: Diagnosis not present

## 2016-10-19 DIAGNOSIS — L299 Pruritus, unspecified: Secondary | ICD-10-CM | POA: Diagnosis not present

## 2016-10-19 DIAGNOSIS — N2581 Secondary hyperparathyroidism of renal origin: Secondary | ICD-10-CM | POA: Diagnosis not present

## 2016-10-26 DIAGNOSIS — D631 Anemia in chronic kidney disease: Secondary | ICD-10-CM | POA: Diagnosis not present

## 2016-10-26 DIAGNOSIS — N2581 Secondary hyperparathyroidism of renal origin: Secondary | ICD-10-CM | POA: Diagnosis not present

## 2016-10-26 DIAGNOSIS — I10 Essential (primary) hypertension: Secondary | ICD-10-CM | POA: Diagnosis not present

## 2016-10-26 DIAGNOSIS — N186 End stage renal disease: Secondary | ICD-10-CM | POA: Diagnosis not present

## 2016-10-26 DIAGNOSIS — L299 Pruritus, unspecified: Secondary | ICD-10-CM | POA: Diagnosis not present

## 2016-10-28 DIAGNOSIS — N2581 Secondary hyperparathyroidism of renal origin: Secondary | ICD-10-CM | POA: Diagnosis not present

## 2016-10-28 DIAGNOSIS — I10 Essential (primary) hypertension: Secondary | ICD-10-CM | POA: Diagnosis not present

## 2016-10-28 DIAGNOSIS — D631 Anemia in chronic kidney disease: Secondary | ICD-10-CM | POA: Diagnosis not present

## 2016-10-28 DIAGNOSIS — N186 End stage renal disease: Secondary | ICD-10-CM | POA: Diagnosis not present

## 2016-10-28 DIAGNOSIS — L299 Pruritus, unspecified: Secondary | ICD-10-CM | POA: Diagnosis not present

## 2016-10-28 IMAGING — US IR US GUIDE VASC ACCESS RIGHT
1 series · 1 of 1 positions shown · non-contrast
Comparison: none

INDICATION: 24-year-old male with a history of renal failure.

[Series 1: ir fluoro/shunt/fist · 1 of 1 slices shown]
[im 1/1]
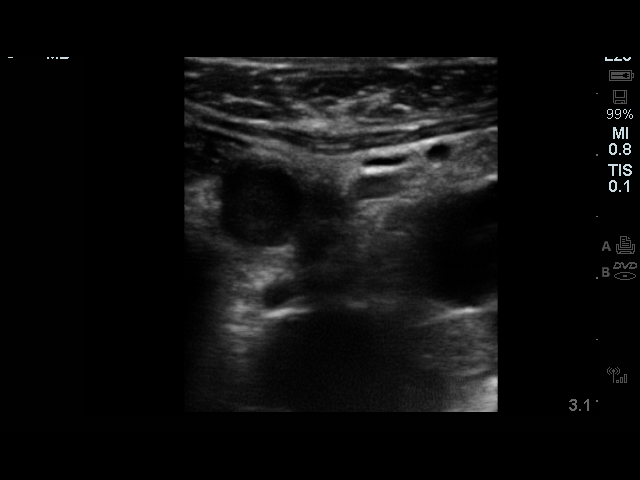

[1 of 1 positions shown; findings below may reference images not displayed]

He has been referred for hemodialysis catheter placement.

EXAM:
TUNNELED CENTRAL VENOUS HEMODIALYSIS CATHETER PLACEMENT WITH
ULTRASOUND AND FLUOROSCOPIC GUIDANCE

MEDICATIONS:
2.0 g Ancef . The antibiotic was given in an appropriate time
interval prior to skin puncture.

ANESTHESIA/SEDATION:
Moderate (conscious) sedation was employed during this procedure. A
total of Versed 2.0 mg and Fentanyl 100 mcg was administered
intravenously.

Moderate Sedation Time: 20 minutes. The patient's level of
consciousness and vital signs were monitored continuously by
radiology nursing throughout the procedure under my direct
supervision.

FLUOROSCOPY TIME:  Fluoroscopy Time: 1 minutes 36 seconds (2.0 mGy).

COMPLICATIONS:
None



After creating a small venotomy incision, a micropuncture kit was
utilized to access the right internal jugular vein under direct,
real-time ultrasound guidance after the overlying soft tissues were
anesthetized with 1% lidocaine with epinephrine. Ultrasound image
documentation was performed. The microwire was kinked to measure
appropriate catheter length. A stiff Glidewire was advanced to the
level of the IVC and the micropuncture sheath was exchanged for a
peel-away sheath. A tunneled hemodialysis catheter measuring 19 cm
from tip to cuff was tunneled in a retrograde fashion from the
anterior chest wall to the venotomy incision.

The catheter was then placed through the peel-away sheath with tips
ultimately positioned within the superior aspect of the right
atrium. Final catheter positioning was confirmed and documented with
a spot radiographic image. The catheter aspirates and flushes
normally. The catheter was flushed with appropriate volume heparin
dwells.

The catheter exit site was secured with a 0-Prolene retention
suture. The venotomy incision was closed with Dermabond and
[place]. Dressings were applied. The patient tolerated the
procedure well without immediate post procedural complication.
IMPRESSION: Status post right IJ 19 cm tip to cuff tunneled hemodialysis
catheter. Catheter ready for use.

## 2016-11-02 DIAGNOSIS — N186 End stage renal disease: Secondary | ICD-10-CM | POA: Diagnosis not present

## 2016-11-02 DIAGNOSIS — I10 Essential (primary) hypertension: Secondary | ICD-10-CM | POA: Diagnosis not present

## 2016-11-02 DIAGNOSIS — N2581 Secondary hyperparathyroidism of renal origin: Secondary | ICD-10-CM | POA: Diagnosis not present

## 2016-11-02 DIAGNOSIS — L299 Pruritus, unspecified: Secondary | ICD-10-CM | POA: Diagnosis not present

## 2016-11-02 DIAGNOSIS — D631 Anemia in chronic kidney disease: Secondary | ICD-10-CM | POA: Diagnosis not present

## 2016-11-04 DIAGNOSIS — L299 Pruritus, unspecified: Secondary | ICD-10-CM | POA: Diagnosis not present

## 2016-11-04 DIAGNOSIS — I10 Essential (primary) hypertension: Secondary | ICD-10-CM | POA: Diagnosis not present

## 2016-11-04 DIAGNOSIS — N186 End stage renal disease: Secondary | ICD-10-CM | POA: Diagnosis not present

## 2016-11-04 DIAGNOSIS — D631 Anemia in chronic kidney disease: Secondary | ICD-10-CM | POA: Diagnosis not present

## 2016-11-04 DIAGNOSIS — N2581 Secondary hyperparathyroidism of renal origin: Secondary | ICD-10-CM | POA: Diagnosis not present

## 2016-11-07 DIAGNOSIS — I10 Essential (primary) hypertension: Secondary | ICD-10-CM | POA: Diagnosis not present

## 2016-11-07 DIAGNOSIS — N186 End stage renal disease: Secondary | ICD-10-CM | POA: Diagnosis not present

## 2016-11-07 DIAGNOSIS — L299 Pruritus, unspecified: Secondary | ICD-10-CM | POA: Diagnosis not present

## 2016-11-07 DIAGNOSIS — D631 Anemia in chronic kidney disease: Secondary | ICD-10-CM | POA: Diagnosis not present

## 2016-11-07 DIAGNOSIS — N2581 Secondary hyperparathyroidism of renal origin: Secondary | ICD-10-CM | POA: Diagnosis not present

## 2016-11-09 DIAGNOSIS — N2581 Secondary hyperparathyroidism of renal origin: Secondary | ICD-10-CM | POA: Diagnosis not present

## 2016-11-09 DIAGNOSIS — L299 Pruritus, unspecified: Secondary | ICD-10-CM | POA: Diagnosis not present

## 2016-11-09 DIAGNOSIS — D631 Anemia in chronic kidney disease: Secondary | ICD-10-CM | POA: Diagnosis not present

## 2016-11-09 DIAGNOSIS — N186 End stage renal disease: Secondary | ICD-10-CM | POA: Diagnosis not present

## 2016-11-09 DIAGNOSIS — I10 Essential (primary) hypertension: Secondary | ICD-10-CM | POA: Diagnosis not present

## 2016-11-15 DIAGNOSIS — N186 End stage renal disease: Secondary | ICD-10-CM | POA: Diagnosis not present

## 2016-11-15 DIAGNOSIS — Z992 Dependence on renal dialysis: Secondary | ICD-10-CM | POA: Diagnosis not present

## 2016-11-16 DIAGNOSIS — D509 Iron deficiency anemia, unspecified: Secondary | ICD-10-CM | POA: Diagnosis not present

## 2016-11-16 DIAGNOSIS — I10 Essential (primary) hypertension: Secondary | ICD-10-CM | POA: Diagnosis not present

## 2016-11-16 DIAGNOSIS — R5081 Fever presenting with conditions classified elsewhere: Secondary | ICD-10-CM | POA: Diagnosis not present

## 2016-11-16 DIAGNOSIS — L299 Pruritus, unspecified: Secondary | ICD-10-CM | POA: Diagnosis not present

## 2016-11-16 DIAGNOSIS — N2581 Secondary hyperparathyroidism of renal origin: Secondary | ICD-10-CM | POA: Diagnosis not present

## 2016-11-16 DIAGNOSIS — D631 Anemia in chronic kidney disease: Secondary | ICD-10-CM | POA: Diagnosis not present

## 2016-11-16 DIAGNOSIS — N186 End stage renal disease: Secondary | ICD-10-CM | POA: Diagnosis not present

## 2016-11-16 DIAGNOSIS — D696 Thrombocytopenia, unspecified: Secondary | ICD-10-CM | POA: Diagnosis not present

## 2016-11-18 DIAGNOSIS — R5081 Fever presenting with conditions classified elsewhere: Secondary | ICD-10-CM | POA: Diagnosis not present

## 2016-11-18 DIAGNOSIS — N2581 Secondary hyperparathyroidism of renal origin: Secondary | ICD-10-CM | POA: Diagnosis not present

## 2016-11-18 DIAGNOSIS — L299 Pruritus, unspecified: Secondary | ICD-10-CM | POA: Diagnosis not present

## 2016-11-18 DIAGNOSIS — N186 End stage renal disease: Secondary | ICD-10-CM | POA: Diagnosis not present

## 2016-11-18 DIAGNOSIS — D631 Anemia in chronic kidney disease: Secondary | ICD-10-CM | POA: Diagnosis not present

## 2016-11-21 DIAGNOSIS — Z886 Allergy status to analgesic agent status: Secondary | ICD-10-CM | POA: Diagnosis not present

## 2016-11-21 DIAGNOSIS — B20 Human immunodeficiency virus [HIV] disease: Secondary | ICD-10-CM | POA: Diagnosis not present

## 2016-11-21 DIAGNOSIS — J209 Acute bronchitis, unspecified: Secondary | ICD-10-CM | POA: Diagnosis not present

## 2016-11-21 DIAGNOSIS — N186 End stage renal disease: Secondary | ICD-10-CM | POA: Diagnosis not present

## 2016-11-21 DIAGNOSIS — I120 Hypertensive chronic kidney disease with stage 5 chronic kidney disease or end stage renal disease: Secondary | ICD-10-CM | POA: Diagnosis not present

## 2016-11-21 DIAGNOSIS — H109 Unspecified conjunctivitis: Secondary | ICD-10-CM | POA: Diagnosis not present

## 2016-11-21 DIAGNOSIS — Z91030 Bee allergy status: Secondary | ICD-10-CM | POA: Diagnosis not present

## 2016-11-21 DIAGNOSIS — R0789 Other chest pain: Secondary | ICD-10-CM | POA: Diagnosis not present

## 2016-11-21 DIAGNOSIS — Z91018 Allergy to other foods: Secondary | ICD-10-CM | POA: Diagnosis not present

## 2016-11-21 DIAGNOSIS — J45909 Unspecified asthma, uncomplicated: Secondary | ICD-10-CM | POA: Diagnosis not present

## 2016-11-21 DIAGNOSIS — Z720 Tobacco use: Secondary | ICD-10-CM | POA: Diagnosis not present

## 2016-11-21 DIAGNOSIS — Z992 Dependence on renal dialysis: Secondary | ICD-10-CM | POA: Diagnosis not present

## 2016-11-21 DIAGNOSIS — I252 Old myocardial infarction: Secondary | ICD-10-CM | POA: Diagnosis not present

## 2016-11-23 DIAGNOSIS — N186 End stage renal disease: Secondary | ICD-10-CM | POA: Diagnosis not present

## 2016-11-23 DIAGNOSIS — N2581 Secondary hyperparathyroidism of renal origin: Secondary | ICD-10-CM | POA: Diagnosis not present

## 2016-11-23 DIAGNOSIS — R5081 Fever presenting with conditions classified elsewhere: Secondary | ICD-10-CM | POA: Diagnosis not present

## 2016-11-23 DIAGNOSIS — L299 Pruritus, unspecified: Secondary | ICD-10-CM | POA: Diagnosis not present

## 2016-11-23 DIAGNOSIS — D631 Anemia in chronic kidney disease: Secondary | ICD-10-CM | POA: Diagnosis not present

## 2016-11-24 DIAGNOSIS — B308 Other viral conjunctivitis: Secondary | ICD-10-CM | POA: Diagnosis not present

## 2016-11-28 DIAGNOSIS — L299 Pruritus, unspecified: Secondary | ICD-10-CM | POA: Diagnosis not present

## 2016-11-28 DIAGNOSIS — D631 Anemia in chronic kidney disease: Secondary | ICD-10-CM | POA: Diagnosis not present

## 2016-11-28 DIAGNOSIS — R5081 Fever presenting with conditions classified elsewhere: Secondary | ICD-10-CM | POA: Diagnosis not present

## 2016-11-28 DIAGNOSIS — N2581 Secondary hyperparathyroidism of renal origin: Secondary | ICD-10-CM | POA: Diagnosis not present

## 2016-11-28 DIAGNOSIS — N186 End stage renal disease: Secondary | ICD-10-CM | POA: Diagnosis not present

## 2016-11-30 DIAGNOSIS — D631 Anemia in chronic kidney disease: Secondary | ICD-10-CM | POA: Diagnosis not present

## 2016-11-30 DIAGNOSIS — L299 Pruritus, unspecified: Secondary | ICD-10-CM | POA: Diagnosis not present

## 2016-11-30 DIAGNOSIS — N186 End stage renal disease: Secondary | ICD-10-CM | POA: Diagnosis not present

## 2016-11-30 DIAGNOSIS — N2581 Secondary hyperparathyroidism of renal origin: Secondary | ICD-10-CM | POA: Diagnosis not present

## 2016-11-30 DIAGNOSIS — R5081 Fever presenting with conditions classified elsewhere: Secondary | ICD-10-CM | POA: Diagnosis not present

## 2016-12-02 DIAGNOSIS — R112 Nausea with vomiting, unspecified: Secondary | ICD-10-CM | POA: Diagnosis not present

## 2016-12-02 DIAGNOSIS — A419 Sepsis, unspecified organism: Secondary | ICD-10-CM | POA: Diagnosis not present

## 2016-12-02 DIAGNOSIS — F17210 Nicotine dependence, cigarettes, uncomplicated: Secondary | ICD-10-CM | POA: Diagnosis present

## 2016-12-02 DIAGNOSIS — I132 Hypertensive heart and chronic kidney disease with heart failure and with stage 5 chronic kidney disease, or end stage renal disease: Secondary | ICD-10-CM | POA: Diagnosis present

## 2016-12-02 DIAGNOSIS — I420 Dilated cardiomyopathy: Secondary | ICD-10-CM | POA: Diagnosis present

## 2016-12-02 DIAGNOSIS — Z91018 Allergy to other foods: Secondary | ICD-10-CM | POA: Diagnosis not present

## 2016-12-02 DIAGNOSIS — Z8614 Personal history of Methicillin resistant Staphylococcus aureus infection: Secondary | ICD-10-CM | POA: Diagnosis not present

## 2016-12-02 DIAGNOSIS — D631 Anemia in chronic kidney disease: Secondary | ICD-10-CM | POA: Diagnosis not present

## 2016-12-02 DIAGNOSIS — J09X2 Influenza due to identified novel influenza A virus with other respiratory manifestations: Secondary | ICD-10-CM | POA: Diagnosis not present

## 2016-12-02 DIAGNOSIS — I509 Heart failure, unspecified: Secondary | ICD-10-CM | POA: Diagnosis not present

## 2016-12-02 DIAGNOSIS — Z21 Asymptomatic human immunodeficiency virus [HIV] infection status: Secondary | ICD-10-CM | POA: Diagnosis present

## 2016-12-02 DIAGNOSIS — A4189 Other specified sepsis: Secondary | ICD-10-CM | POA: Diagnosis present

## 2016-12-02 DIAGNOSIS — L299 Pruritus, unspecified: Secondary | ICD-10-CM | POA: Diagnosis not present

## 2016-12-02 DIAGNOSIS — E876 Hypokalemia: Secondary | ICD-10-CM | POA: Diagnosis present

## 2016-12-02 DIAGNOSIS — I5020 Unspecified systolic (congestive) heart failure: Secondary | ICD-10-CM | POA: Diagnosis present

## 2016-12-02 DIAGNOSIS — R6520 Severe sepsis without septic shock: Secondary | ICD-10-CM | POA: Diagnosis present

## 2016-12-02 DIAGNOSIS — J1000 Influenza due to other identified influenza virus with unspecified type of pneumonia: Secondary | ICD-10-CM | POA: Diagnosis present

## 2016-12-02 DIAGNOSIS — J9601 Acute respiratory failure with hypoxia: Secondary | ICD-10-CM | POA: Diagnosis present

## 2016-12-02 DIAGNOSIS — R509 Fever, unspecified: Secondary | ICD-10-CM | POA: Diagnosis not present

## 2016-12-02 DIAGNOSIS — N186 End stage renal disease: Secondary | ICD-10-CM | POA: Diagnosis not present

## 2016-12-02 DIAGNOSIS — R197 Diarrhea, unspecified: Secondary | ICD-10-CM | POA: Diagnosis present

## 2016-12-02 DIAGNOSIS — R5081 Fever presenting with conditions classified elsewhere: Secondary | ICD-10-CM | POA: Diagnosis not present

## 2016-12-02 DIAGNOSIS — N2581 Secondary hyperparathyroidism of renal origin: Secondary | ICD-10-CM | POA: Diagnosis not present

## 2016-12-02 DIAGNOSIS — Z992 Dependence on renal dialysis: Secondary | ICD-10-CM | POA: Diagnosis not present

## 2016-12-02 DIAGNOSIS — E785 Hyperlipidemia, unspecified: Secondary | ICD-10-CM | POA: Diagnosis present

## 2016-12-02 DIAGNOSIS — K644 Residual hemorrhoidal skin tags: Secondary | ICD-10-CM | POA: Diagnosis present

## 2016-12-02 DIAGNOSIS — Z886 Allergy status to analgesic agent status: Secondary | ICD-10-CM | POA: Diagnosis not present

## 2016-12-02 DIAGNOSIS — Z91030 Bee allergy status: Secondary | ICD-10-CM | POA: Diagnosis not present

## 2016-12-02 DIAGNOSIS — J189 Pneumonia, unspecified organism: Secondary | ICD-10-CM | POA: Diagnosis not present

## 2016-12-07 DIAGNOSIS — L299 Pruritus, unspecified: Secondary | ICD-10-CM | POA: Diagnosis not present

## 2016-12-07 DIAGNOSIS — R5081 Fever presenting with conditions classified elsewhere: Secondary | ICD-10-CM | POA: Diagnosis not present

## 2016-12-07 DIAGNOSIS — N2581 Secondary hyperparathyroidism of renal origin: Secondary | ICD-10-CM | POA: Diagnosis not present

## 2016-12-07 DIAGNOSIS — D631 Anemia in chronic kidney disease: Secondary | ICD-10-CM | POA: Diagnosis not present

## 2016-12-07 DIAGNOSIS — N186 End stage renal disease: Secondary | ICD-10-CM | POA: Diagnosis not present

## 2016-12-09 DIAGNOSIS — D631 Anemia in chronic kidney disease: Secondary | ICD-10-CM | POA: Diagnosis not present

## 2016-12-09 DIAGNOSIS — R5081 Fever presenting with conditions classified elsewhere: Secondary | ICD-10-CM | POA: Diagnosis not present

## 2016-12-09 DIAGNOSIS — L299 Pruritus, unspecified: Secondary | ICD-10-CM | POA: Diagnosis not present

## 2016-12-09 DIAGNOSIS — N186 End stage renal disease: Secondary | ICD-10-CM | POA: Diagnosis not present

## 2016-12-09 DIAGNOSIS — N2581 Secondary hyperparathyroidism of renal origin: Secondary | ICD-10-CM | POA: Diagnosis not present

## 2016-12-13 DIAGNOSIS — T82868A Thrombosis of vascular prosthetic devices, implants and grafts, initial encounter: Secondary | ICD-10-CM | POA: Diagnosis not present

## 2016-12-13 DIAGNOSIS — F17210 Nicotine dependence, cigarettes, uncomplicated: Secondary | ICD-10-CM | POA: Diagnosis not present

## 2016-12-13 DIAGNOSIS — T82858A Stenosis of vascular prosthetic devices, implants and grafts, initial encounter: Secondary | ICD-10-CM | POA: Diagnosis not present

## 2016-12-13 DIAGNOSIS — N186 End stage renal disease: Secondary | ICD-10-CM | POA: Diagnosis not present

## 2016-12-13 DIAGNOSIS — Z992 Dependence on renal dialysis: Secondary | ICD-10-CM | POA: Diagnosis not present

## 2016-12-14 DIAGNOSIS — I10 Essential (primary) hypertension: Secondary | ICD-10-CM | POA: Diagnosis not present

## 2016-12-14 DIAGNOSIS — D631 Anemia in chronic kidney disease: Secondary | ICD-10-CM | POA: Diagnosis not present

## 2016-12-14 DIAGNOSIS — L299 Pruritus, unspecified: Secondary | ICD-10-CM | POA: Diagnosis not present

## 2016-12-14 DIAGNOSIS — D696 Thrombocytopenia, unspecified: Secondary | ICD-10-CM | POA: Diagnosis not present

## 2016-12-14 DIAGNOSIS — N2581 Secondary hyperparathyroidism of renal origin: Secondary | ICD-10-CM | POA: Diagnosis not present

## 2016-12-14 DIAGNOSIS — N186 End stage renal disease: Secondary | ICD-10-CM | POA: Diagnosis not present

## 2016-12-14 DIAGNOSIS — D509 Iron deficiency anemia, unspecified: Secondary | ICD-10-CM | POA: Diagnosis not present

## 2016-12-16 DIAGNOSIS — D696 Thrombocytopenia, unspecified: Secondary | ICD-10-CM | POA: Diagnosis not present

## 2016-12-16 DIAGNOSIS — L299 Pruritus, unspecified: Secondary | ICD-10-CM | POA: Diagnosis not present

## 2016-12-16 DIAGNOSIS — N2581 Secondary hyperparathyroidism of renal origin: Secondary | ICD-10-CM | POA: Diagnosis not present

## 2016-12-16 DIAGNOSIS — N186 End stage renal disease: Secondary | ICD-10-CM | POA: Diagnosis not present

## 2016-12-16 DIAGNOSIS — I10 Essential (primary) hypertension: Secondary | ICD-10-CM | POA: Diagnosis not present

## 2016-12-18 DIAGNOSIS — J4520 Mild intermittent asthma, uncomplicated: Secondary | ICD-10-CM | POA: Diagnosis not present

## 2016-12-18 DIAGNOSIS — B20 Human immunodeficiency virus [HIV] disease: Secondary | ICD-10-CM | POA: Diagnosis not present

## 2016-12-21 DIAGNOSIS — N2581 Secondary hyperparathyroidism of renal origin: Secondary | ICD-10-CM | POA: Diagnosis not present

## 2016-12-21 DIAGNOSIS — I10 Essential (primary) hypertension: Secondary | ICD-10-CM | POA: Diagnosis not present

## 2016-12-21 DIAGNOSIS — N186 End stage renal disease: Secondary | ICD-10-CM | POA: Diagnosis not present

## 2016-12-21 DIAGNOSIS — D696 Thrombocytopenia, unspecified: Secondary | ICD-10-CM | POA: Diagnosis not present

## 2016-12-21 DIAGNOSIS — L299 Pruritus, unspecified: Secondary | ICD-10-CM | POA: Diagnosis not present

## 2016-12-27 DIAGNOSIS — Z992 Dependence on renal dialysis: Secondary | ICD-10-CM | POA: Diagnosis not present

## 2016-12-27 DIAGNOSIS — Z9115 Patient's noncompliance with renal dialysis: Secondary | ICD-10-CM | POA: Diagnosis not present

## 2016-12-27 DIAGNOSIS — I340 Nonrheumatic mitral (valve) insufficiency: Secondary | ICD-10-CM | POA: Diagnosis present

## 2016-12-27 DIAGNOSIS — M79652 Pain in left thigh: Secondary | ICD-10-CM | POA: Diagnosis present

## 2016-12-27 DIAGNOSIS — I2729 Other secondary pulmonary hypertension: Secondary | ICD-10-CM | POA: Diagnosis present

## 2016-12-27 DIAGNOSIS — I132 Hypertensive heart and chronic kidney disease with heart failure and with stage 5 chronic kidney disease, or end stage renal disease: Secondary | ICD-10-CM | POA: Diagnosis present

## 2016-12-27 DIAGNOSIS — D638 Anemia in other chronic diseases classified elsewhere: Secondary | ICD-10-CM | POA: Diagnosis present

## 2016-12-27 DIAGNOSIS — G9341 Metabolic encephalopathy: Secondary | ICD-10-CM | POA: Diagnosis present

## 2016-12-27 DIAGNOSIS — E874 Mixed disorder of acid-base balance: Secondary | ICD-10-CM | POA: Diagnosis present

## 2016-12-27 DIAGNOSIS — M79661 Pain in right lower leg: Secondary | ICD-10-CM | POA: Diagnosis not present

## 2016-12-27 DIAGNOSIS — R6520 Severe sepsis without septic shock: Secondary | ICD-10-CM | POA: Diagnosis present

## 2016-12-27 DIAGNOSIS — Z91030 Bee allergy status: Secondary | ICD-10-CM | POA: Diagnosis not present

## 2016-12-27 DIAGNOSIS — E875 Hyperkalemia: Secondary | ICD-10-CM | POA: Diagnosis present

## 2016-12-27 DIAGNOSIS — B20 Human immunodeficiency virus [HIV] disease: Secondary | ICD-10-CM | POA: Diagnosis present

## 2016-12-27 DIAGNOSIS — M79606 Pain in leg, unspecified: Secondary | ICD-10-CM | POA: Diagnosis not present

## 2016-12-27 DIAGNOSIS — M79651 Pain in right thigh: Secondary | ICD-10-CM | POA: Diagnosis present

## 2016-12-27 DIAGNOSIS — R52 Pain, unspecified: Secondary | ICD-10-CM | POA: Diagnosis not present

## 2016-12-27 DIAGNOSIS — I5022 Chronic systolic (congestive) heart failure: Secondary | ICD-10-CM | POA: Diagnosis present

## 2016-12-27 DIAGNOSIS — N186 End stage renal disease: Secondary | ICD-10-CM | POA: Diagnosis present

## 2016-12-27 DIAGNOSIS — Z91018 Allergy to other foods: Secondary | ICD-10-CM | POA: Diagnosis not present

## 2016-12-27 DIAGNOSIS — I252 Old myocardial infarction: Secondary | ICD-10-CM | POA: Diagnosis not present

## 2016-12-27 DIAGNOSIS — M79662 Pain in left lower leg: Secondary | ICD-10-CM | POA: Diagnosis not present

## 2016-12-27 DIAGNOSIS — F17210 Nicotine dependence, cigarettes, uncomplicated: Secondary | ICD-10-CM | POA: Diagnosis present

## 2016-12-27 DIAGNOSIS — T82868A Thrombosis of vascular prosthetic devices, implants and grafts, initial encounter: Secondary | ICD-10-CM | POA: Diagnosis not present

## 2016-12-27 DIAGNOSIS — A419 Sepsis, unspecified organism: Secondary | ICD-10-CM | POA: Diagnosis present

## 2016-12-27 DIAGNOSIS — Z886 Allergy status to analgesic agent status: Secondary | ICD-10-CM | POA: Diagnosis not present

## 2016-12-27 DIAGNOSIS — E871 Hypo-osmolality and hyponatremia: Secondary | ICD-10-CM | POA: Diagnosis present

## 2016-12-27 DIAGNOSIS — E162 Hypoglycemia, unspecified: Secondary | ICD-10-CM | POA: Diagnosis present

## 2016-12-27 DIAGNOSIS — M79669 Pain in unspecified lower leg: Secondary | ICD-10-CM | POA: Diagnosis not present

## 2017-01-03 DIAGNOSIS — J45909 Unspecified asthma, uncomplicated: Secondary | ICD-10-CM | POA: Diagnosis present

## 2017-01-03 DIAGNOSIS — L409 Psoriasis, unspecified: Secondary | ICD-10-CM | POA: Diagnosis present

## 2017-01-03 DIAGNOSIS — E8770 Fluid overload, unspecified: Secondary | ICD-10-CM | POA: Diagnosis present

## 2017-01-03 DIAGNOSIS — R600 Localized edema: Secondary | ICD-10-CM | POA: Diagnosis not present

## 2017-01-03 DIAGNOSIS — Z992 Dependence on renal dialysis: Secondary | ICD-10-CM | POA: Diagnosis not present

## 2017-01-03 DIAGNOSIS — B20 Human immunodeficiency virus [HIV] disease: Secondary | ICD-10-CM | POA: Diagnosis present

## 2017-01-03 DIAGNOSIS — F17210 Nicotine dependence, cigarettes, uncomplicated: Secondary | ICD-10-CM | POA: Diagnosis present

## 2017-01-03 DIAGNOSIS — E875 Hyperkalemia: Secondary | ICD-10-CM | POA: Diagnosis not present

## 2017-01-03 DIAGNOSIS — T82858A Stenosis of vascular prosthetic devices, implants and grafts, initial encounter: Secondary | ICD-10-CM | POA: Diagnosis not present

## 2017-01-03 DIAGNOSIS — E872 Acidosis: Secondary | ICD-10-CM | POA: Diagnosis not present

## 2017-01-03 DIAGNOSIS — I252 Old myocardial infarction: Secondary | ICD-10-CM | POA: Diagnosis not present

## 2017-01-03 DIAGNOSIS — N186 End stage renal disease: Secondary | ICD-10-CM | POA: Diagnosis present

## 2017-01-03 DIAGNOSIS — I120 Hypertensive chronic kidney disease with stage 5 chronic kidney disease or end stage renal disease: Secondary | ICD-10-CM | POA: Diagnosis present

## 2017-01-03 DIAGNOSIS — I2510 Atherosclerotic heart disease of native coronary artery without angina pectoris: Secondary | ICD-10-CM | POA: Diagnosis present

## 2017-01-03 DIAGNOSIS — Z4901 Encounter for fitting and adjustment of extracorporeal dialysis catheter: Secondary | ICD-10-CM | POA: Diagnosis not present

## 2017-01-03 DIAGNOSIS — I429 Cardiomyopathy, unspecified: Secondary | ICD-10-CM | POA: Diagnosis present

## 2017-01-03 DIAGNOSIS — E8779 Other fluid overload: Secondary | ICD-10-CM | POA: Diagnosis not present

## 2017-01-03 DIAGNOSIS — T82868A Thrombosis of vascular prosthetic devices, implants and grafts, initial encounter: Secondary | ICD-10-CM | POA: Diagnosis not present

## 2017-01-03 DIAGNOSIS — T82838A Hemorrhage of vascular prosthetic devices, implants and grafts, initial encounter: Secondary | ICD-10-CM | POA: Diagnosis not present

## 2017-01-03 DIAGNOSIS — T82818A Embolism of vascular prosthetic devices, implants and grafts, initial encounter: Secondary | ICD-10-CM | POA: Diagnosis not present

## 2017-01-03 DIAGNOSIS — Z8614 Personal history of Methicillin resistant Staphylococcus aureus infection: Secondary | ICD-10-CM | POA: Diagnosis not present

## 2017-01-04 DIAGNOSIS — Z4901 Encounter for fitting and adjustment of extracorporeal dialysis catheter: Secondary | ICD-10-CM | POA: Diagnosis not present

## 2017-01-04 DIAGNOSIS — T82858A Stenosis of vascular prosthetic devices, implants and grafts, initial encounter: Secondary | ICD-10-CM | POA: Diagnosis not present

## 2017-01-04 DIAGNOSIS — N186 End stage renal disease: Secondary | ICD-10-CM | POA: Diagnosis not present

## 2017-01-04 DIAGNOSIS — T82868A Thrombosis of vascular prosthetic devices, implants and grafts, initial encounter: Secondary | ICD-10-CM | POA: Diagnosis not present

## 2017-01-06 DIAGNOSIS — N2581 Secondary hyperparathyroidism of renal origin: Secondary | ICD-10-CM | POA: Diagnosis not present

## 2017-01-06 DIAGNOSIS — L299 Pruritus, unspecified: Secondary | ICD-10-CM | POA: Diagnosis not present

## 2017-01-06 DIAGNOSIS — D696 Thrombocytopenia, unspecified: Secondary | ICD-10-CM | POA: Diagnosis not present

## 2017-01-06 DIAGNOSIS — N186 End stage renal disease: Secondary | ICD-10-CM | POA: Diagnosis not present

## 2017-01-06 DIAGNOSIS — I10 Essential (primary) hypertension: Secondary | ICD-10-CM | POA: Diagnosis not present

## 2017-01-09 DIAGNOSIS — N2581 Secondary hyperparathyroidism of renal origin: Secondary | ICD-10-CM | POA: Diagnosis not present

## 2017-01-09 DIAGNOSIS — N186 End stage renal disease: Secondary | ICD-10-CM | POA: Diagnosis not present

## 2017-01-09 DIAGNOSIS — L299 Pruritus, unspecified: Secondary | ICD-10-CM | POA: Diagnosis not present

## 2017-01-09 DIAGNOSIS — I10 Essential (primary) hypertension: Secondary | ICD-10-CM | POA: Diagnosis not present

## 2017-01-09 DIAGNOSIS — D696 Thrombocytopenia, unspecified: Secondary | ICD-10-CM | POA: Diagnosis not present

## 2017-01-11 DIAGNOSIS — D696 Thrombocytopenia, unspecified: Secondary | ICD-10-CM | POA: Diagnosis not present

## 2017-01-11 DIAGNOSIS — I10 Essential (primary) hypertension: Secondary | ICD-10-CM | POA: Diagnosis not present

## 2017-01-11 DIAGNOSIS — L299 Pruritus, unspecified: Secondary | ICD-10-CM | POA: Diagnosis not present

## 2017-01-11 DIAGNOSIS — N2581 Secondary hyperparathyroidism of renal origin: Secondary | ICD-10-CM | POA: Diagnosis not present

## 2017-01-11 DIAGNOSIS — N186 End stage renal disease: Secondary | ICD-10-CM | POA: Diagnosis not present

## 2017-01-13 DIAGNOSIS — L299 Pruritus, unspecified: Secondary | ICD-10-CM | POA: Diagnosis not present

## 2017-01-13 DIAGNOSIS — D696 Thrombocytopenia, unspecified: Secondary | ICD-10-CM | POA: Diagnosis not present

## 2017-01-13 DIAGNOSIS — N186 End stage renal disease: Secondary | ICD-10-CM | POA: Diagnosis not present

## 2017-01-13 DIAGNOSIS — I10 Essential (primary) hypertension: Secondary | ICD-10-CM | POA: Diagnosis not present

## 2017-01-13 DIAGNOSIS — Z992 Dependence on renal dialysis: Secondary | ICD-10-CM | POA: Diagnosis not present

## 2017-01-13 DIAGNOSIS — N2581 Secondary hyperparathyroidism of renal origin: Secondary | ICD-10-CM | POA: Diagnosis not present

## 2017-01-16 DIAGNOSIS — D696 Thrombocytopenia, unspecified: Secondary | ICD-10-CM | POA: Diagnosis not present

## 2017-01-16 DIAGNOSIS — N186 End stage renal disease: Secondary | ICD-10-CM | POA: Diagnosis not present

## 2017-01-16 DIAGNOSIS — D631 Anemia in chronic kidney disease: Secondary | ICD-10-CM | POA: Diagnosis not present

## 2017-01-16 DIAGNOSIS — R5081 Fever presenting with conditions classified elsewhere: Secondary | ICD-10-CM | POA: Diagnosis not present

## 2017-01-16 DIAGNOSIS — R7881 Bacteremia: Secondary | ICD-10-CM | POA: Diagnosis not present

## 2017-01-16 DIAGNOSIS — N2581 Secondary hyperparathyroidism of renal origin: Secondary | ICD-10-CM | POA: Diagnosis not present

## 2017-01-16 DIAGNOSIS — L299 Pruritus, unspecified: Secondary | ICD-10-CM | POA: Diagnosis not present

## 2017-01-16 DIAGNOSIS — D509 Iron deficiency anemia, unspecified: Secondary | ICD-10-CM | POA: Diagnosis not present

## 2017-01-16 DIAGNOSIS — I10 Essential (primary) hypertension: Secondary | ICD-10-CM | POA: Diagnosis not present

## 2017-01-17 DIAGNOSIS — N186 End stage renal disease: Secondary | ICD-10-CM | POA: Diagnosis not present

## 2017-01-17 DIAGNOSIS — M7989 Other specified soft tissue disorders: Secondary | ICD-10-CM | POA: Diagnosis not present

## 2017-01-17 DIAGNOSIS — Z992 Dependence on renal dialysis: Secondary | ICD-10-CM | POA: Diagnosis not present

## 2017-01-17 DIAGNOSIS — R062 Wheezing: Secondary | ICD-10-CM | POA: Diagnosis not present

## 2017-01-17 DIAGNOSIS — R918 Other nonspecific abnormal finding of lung field: Secondary | ICD-10-CM | POA: Diagnosis not present

## 2017-01-17 DIAGNOSIS — T8243XA Leakage of vascular dialysis catheter, initial encounter: Secondary | ICD-10-CM | POA: Diagnosis not present

## 2017-01-17 DIAGNOSIS — R000 Tachycardia, unspecified: Secondary | ICD-10-CM | POA: Diagnosis not present

## 2017-01-17 DIAGNOSIS — T827XXA Infection and inflammatory reaction due to other cardiac and vascular devices, implants and grafts, initial encounter: Secondary | ICD-10-CM | POA: Diagnosis not present

## 2017-01-17 DIAGNOSIS — R509 Fever, unspecified: Secondary | ICD-10-CM | POA: Diagnosis not present

## 2017-01-17 DIAGNOSIS — Z21 Asymptomatic human immunodeficiency virus [HIV] infection status: Secondary | ICD-10-CM | POA: Diagnosis not present

## 2017-01-18 DIAGNOSIS — N186 End stage renal disease: Secondary | ICD-10-CM | POA: Diagnosis not present

## 2017-01-18 DIAGNOSIS — I5022 Chronic systolic (congestive) heart failure: Secondary | ICD-10-CM | POA: Diagnosis not present

## 2017-01-18 DIAGNOSIS — B20 Human immunodeficiency virus [HIV] disease: Secondary | ICD-10-CM | POA: Diagnosis not present

## 2017-01-18 DIAGNOSIS — A419 Sepsis, unspecified organism: Secondary | ICD-10-CM | POA: Diagnosis not present

## 2017-01-18 DIAGNOSIS — R509 Fever, unspecified: Secondary | ICD-10-CM | POA: Diagnosis not present

## 2017-01-18 DIAGNOSIS — B9620 Unspecified Escherichia coli [E. coli] as the cause of diseases classified elsewhere: Secondary | ICD-10-CM | POA: Diagnosis not present

## 2017-01-18 DIAGNOSIS — Z992 Dependence on renal dialysis: Secondary | ICD-10-CM | POA: Diagnosis not present

## 2017-01-18 DIAGNOSIS — I509 Heart failure, unspecified: Secondary | ICD-10-CM | POA: Diagnosis not present

## 2017-01-18 DIAGNOSIS — T827XXA Infection and inflammatory reaction due to other cardiac and vascular devices, implants and grafts, initial encounter: Secondary | ICD-10-CM | POA: Diagnosis not present

## 2017-01-19 DIAGNOSIS — Z992 Dependence on renal dialysis: Secondary | ICD-10-CM | POA: Diagnosis not present

## 2017-01-19 DIAGNOSIS — R509 Fever, unspecified: Secondary | ICD-10-CM | POA: Diagnosis not present

## 2017-01-19 DIAGNOSIS — B20 Human immunodeficiency virus [HIV] disease: Secondary | ICD-10-CM | POA: Diagnosis not present

## 2017-01-19 DIAGNOSIS — A419 Sepsis, unspecified organism: Secondary | ICD-10-CM | POA: Diagnosis not present

## 2017-01-19 DIAGNOSIS — T827XXA Infection and inflammatory reaction due to other cardiac and vascular devices, implants and grafts, initial encounter: Secondary | ICD-10-CM | POA: Diagnosis not present

## 2017-01-19 DIAGNOSIS — N186 End stage renal disease: Secondary | ICD-10-CM | POA: Diagnosis not present

## 2017-01-19 DIAGNOSIS — I5022 Chronic systolic (congestive) heart failure: Secondary | ICD-10-CM | POA: Diagnosis not present

## 2017-01-20 DIAGNOSIS — N186 End stage renal disease: Secondary | ICD-10-CM | POA: Diagnosis not present

## 2017-01-20 DIAGNOSIS — Z992 Dependence on renal dialysis: Secondary | ICD-10-CM | POA: Diagnosis not present

## 2017-01-23 ENCOUNTER — Encounter (HOSPITAL_COMMUNITY): Payer: Self-pay | Admitting: Emergency Medicine

## 2017-01-23 DIAGNOSIS — F17210 Nicotine dependence, cigarettes, uncomplicated: Secondary | ICD-10-CM | POA: Diagnosis present

## 2017-01-23 DIAGNOSIS — K529 Noninfective gastroenteritis and colitis, unspecified: Secondary | ICD-10-CM | POA: Diagnosis present

## 2017-01-23 DIAGNOSIS — D631 Anemia in chronic kidney disease: Secondary | ICD-10-CM | POA: Diagnosis not present

## 2017-01-23 DIAGNOSIS — Z992 Dependence on renal dialysis: Secondary | ICD-10-CM

## 2017-01-23 DIAGNOSIS — I429 Cardiomyopathy, unspecified: Secondary | ICD-10-CM | POA: Diagnosis present

## 2017-01-23 DIAGNOSIS — I132 Hypertensive heart and chronic kidney disease with heart failure and with stage 5 chronic kidney disease, or end stage renal disease: Secondary | ICD-10-CM | POA: Diagnosis not present

## 2017-01-23 DIAGNOSIS — K859 Acute pancreatitis without necrosis or infection, unspecified: Secondary | ICD-10-CM | POA: Diagnosis not present

## 2017-01-23 DIAGNOSIS — E876 Hypokalemia: Secondary | ICD-10-CM | POA: Diagnosis present

## 2017-01-23 DIAGNOSIS — I5022 Chronic systolic (congestive) heart failure: Secondary | ICD-10-CM | POA: Diagnosis not present

## 2017-01-23 DIAGNOSIS — R112 Nausea with vomiting, unspecified: Secondary | ICD-10-CM | POA: Diagnosis not present

## 2017-01-23 DIAGNOSIS — J45909 Unspecified asthma, uncomplicated: Secondary | ICD-10-CM | POA: Diagnosis present

## 2017-01-23 DIAGNOSIS — L299 Pruritus, unspecified: Secondary | ICD-10-CM | POA: Diagnosis not present

## 2017-01-23 DIAGNOSIS — I313 Pericardial effusion (noninflammatory): Secondary | ICD-10-CM | POA: Diagnosis not present

## 2017-01-23 DIAGNOSIS — R197 Diarrhea, unspecified: Secondary | ICD-10-CM | POA: Diagnosis not present

## 2017-01-23 DIAGNOSIS — N186 End stage renal disease: Secondary | ICD-10-CM | POA: Diagnosis not present

## 2017-01-23 DIAGNOSIS — R7881 Bacteremia: Secondary | ICD-10-CM | POA: Diagnosis not present

## 2017-01-23 DIAGNOSIS — E871 Hypo-osmolality and hyponatremia: Secondary | ICD-10-CM | POA: Diagnosis present

## 2017-01-23 DIAGNOSIS — I252 Old myocardial infarction: Secondary | ICD-10-CM

## 2017-01-23 DIAGNOSIS — L309 Dermatitis, unspecified: Secondary | ICD-10-CM | POA: Diagnosis present

## 2017-01-23 DIAGNOSIS — D539 Nutritional anemia, unspecified: Secondary | ICD-10-CM | POA: Diagnosis present

## 2017-01-23 DIAGNOSIS — R1010 Upper abdominal pain, unspecified: Secondary | ICD-10-CM | POA: Diagnosis not present

## 2017-01-23 DIAGNOSIS — K761 Chronic passive congestion of liver: Secondary | ICD-10-CM | POA: Diagnosis present

## 2017-01-23 DIAGNOSIS — Z21 Asymptomatic human immunodeficiency virus [HIV] infection status: Secondary | ICD-10-CM | POA: Diagnosis present

## 2017-01-23 DIAGNOSIS — R188 Other ascites: Secondary | ICD-10-CM | POA: Diagnosis present

## 2017-01-23 DIAGNOSIS — Z79899 Other long term (current) drug therapy: Secondary | ICD-10-CM

## 2017-01-23 DIAGNOSIS — R748 Abnormal levels of other serum enzymes: Secondary | ICD-10-CM | POA: Diagnosis not present

## 2017-01-23 DIAGNOSIS — M898X9 Other specified disorders of bone, unspecified site: Secondary | ICD-10-CM | POA: Diagnosis present

## 2017-01-23 DIAGNOSIS — N2581 Secondary hyperparathyroidism of renal origin: Secondary | ICD-10-CM | POA: Diagnosis not present

## 2017-01-23 LAB — COMPREHENSIVE METABOLIC PANEL
ALK PHOS: 132 U/L — AB (ref 38–126)
ALT: 6 U/L — AB (ref 17–63)
AST: 28 U/L (ref 15–41)
Albumin: 2.7 g/dL — ABNORMAL LOW (ref 3.5–5.0)
Anion gap: 11 (ref 5–15)
BUN: 13 mg/dL (ref 6–20)
CHLORIDE: 91 mmol/L — AB (ref 101–111)
CO2: 30 mmol/L (ref 22–32)
CREATININE: 3.54 mg/dL — AB (ref 0.61–1.24)
Calcium: 8.3 mg/dL — ABNORMAL LOW (ref 8.9–10.3)
GFR calc non Af Amer: 22 mL/min — ABNORMAL LOW (ref >60)
GFR, EST AFRICAN AMERICAN: 26 mL/min — AB (ref >60)
Glucose, Bld: 107 mg/dL — ABNORMAL HIGH (ref 65–99)
Potassium: 3.1 mmol/L — ABNORMAL LOW (ref 3.5–5.1)
Sodium: 132 mmol/L — ABNORMAL LOW (ref 135–145)
Total Bilirubin: 1.9 mg/dL — ABNORMAL HIGH (ref 0.3–1.2)
Total Protein: 8.7 g/dL — ABNORMAL HIGH (ref 6.5–8.1)

## 2017-01-23 LAB — CBC
HEMATOCRIT: 29.8 % — AB (ref 39.0–52.0)
Hemoglobin: 9.6 g/dL — ABNORMAL LOW (ref 13.0–17.0)
MCH: 34.9 pg — AB (ref 26.0–34.0)
MCHC: 32.2 g/dL (ref 30.0–36.0)
MCV: 108.4 fL — AB (ref 78.0–100.0)
PLATELETS: 302 10*3/uL (ref 150–400)
RBC: 2.75 MIL/uL — AB (ref 4.22–5.81)
RDW: 19.1 % — AB (ref 11.5–15.5)
WBC: 9.4 10*3/uL (ref 4.0–10.5)

## 2017-01-23 LAB — LIPASE, BLOOD: Lipase: 549 U/L — ABNORMAL HIGH (ref 11–51)

## 2017-01-23 NOTE — ED Triage Notes (Signed)
Pt c/o generalized abd pain since yesterday. Pt denies any n/v/d.

## 2017-01-24 ENCOUNTER — Inpatient Hospital Stay (HOSPITAL_COMMUNITY)
Admission: EM | Admit: 2017-01-24 | Discharge: 2017-01-27 | DRG: 438 | Disposition: A | Discharge status: Home / Self Care | Payer: Medicare Other | Attending: Family Medicine | Admitting: Family Medicine

## 2017-01-24 ENCOUNTER — Emergency Department (HOSPITAL_COMMUNITY): Payer: Medicare Other

## 2017-01-24 DIAGNOSIS — R748 Abnormal levels of other serum enzymes: Secondary | ICD-10-CM | POA: Diagnosis not present

## 2017-01-24 DIAGNOSIS — I5022 Chronic systolic (congestive) heart failure: Secondary | ICD-10-CM | POA: Diagnosis present

## 2017-01-24 DIAGNOSIS — K859 Acute pancreatitis without necrosis or infection, unspecified: Secondary | ICD-10-CM | POA: Diagnosis not present

## 2017-01-24 DIAGNOSIS — J45909 Unspecified asthma, uncomplicated: Secondary | ICD-10-CM | POA: Diagnosis present

## 2017-01-24 DIAGNOSIS — Z21 Asymptomatic human immunodeficiency virus [HIV] infection status: Secondary | ICD-10-CM | POA: Diagnosis present

## 2017-01-24 DIAGNOSIS — I132 Hypertensive heart and chronic kidney disease with heart failure and with stage 5 chronic kidney disease, or end stage renal disease: Secondary | ICD-10-CM | POA: Diagnosis present

## 2017-01-24 DIAGNOSIS — R197 Diarrhea, unspecified: Secondary | ICD-10-CM

## 2017-01-24 DIAGNOSIS — L309 Dermatitis, unspecified: Secondary | ICD-10-CM | POA: Diagnosis present

## 2017-01-24 DIAGNOSIS — R1010 Upper abdominal pain, unspecified: Secondary | ICD-10-CM | POA: Diagnosis not present

## 2017-01-24 DIAGNOSIS — K529 Noninfective gastroenteritis and colitis, unspecified: Secondary | ICD-10-CM | POA: Diagnosis present

## 2017-01-24 DIAGNOSIS — B20 Human immunodeficiency virus [HIV] disease: Secondary | ICD-10-CM | POA: Diagnosis not present

## 2017-01-24 DIAGNOSIS — Z992 Dependence on renal dialysis: Secondary | ICD-10-CM | POA: Diagnosis not present

## 2017-01-24 DIAGNOSIS — N186 End stage renal disease: Secondary | ICD-10-CM

## 2017-01-24 DIAGNOSIS — I252 Old myocardial infarction: Secondary | ICD-10-CM | POA: Diagnosis not present

## 2017-01-24 DIAGNOSIS — R112 Nausea with vomiting, unspecified: Secondary | ICD-10-CM | POA: Diagnosis not present

## 2017-01-24 DIAGNOSIS — Z79899 Other long term (current) drug therapy: Secondary | ICD-10-CM | POA: Diagnosis not present

## 2017-01-24 DIAGNOSIS — I428 Other cardiomyopathies: Secondary | ICD-10-CM

## 2017-01-24 DIAGNOSIS — D539 Nutritional anemia, unspecified: Secondary | ICD-10-CM | POA: Diagnosis present

## 2017-01-24 DIAGNOSIS — I313 Pericardial effusion (noninflammatory): Secondary | ICD-10-CM | POA: Diagnosis present

## 2017-01-24 DIAGNOSIS — E876 Hypokalemia: Secondary | ICD-10-CM | POA: Diagnosis not present

## 2017-01-24 DIAGNOSIS — D631 Anemia in chronic kidney disease: Secondary | ICD-10-CM | POA: Diagnosis present

## 2017-01-24 DIAGNOSIS — I429 Cardiomyopathy, unspecified: Secondary | ICD-10-CM | POA: Diagnosis present

## 2017-01-24 DIAGNOSIS — M898X9 Other specified disorders of bone, unspecified site: Secondary | ICD-10-CM | POA: Diagnosis present

## 2017-01-24 DIAGNOSIS — F17210 Nicotine dependence, cigarettes, uncomplicated: Secondary | ICD-10-CM | POA: Diagnosis present

## 2017-01-24 DIAGNOSIS — K761 Chronic passive congestion of liver: Secondary | ICD-10-CM | POA: Diagnosis present

## 2017-01-24 DIAGNOSIS — R188 Other ascites: Secondary | ICD-10-CM | POA: Diagnosis present

## 2017-01-24 DIAGNOSIS — E871 Hypo-osmolality and hyponatremia: Secondary | ICD-10-CM | POA: Diagnosis present

## 2017-01-24 LAB — MRSA PCR SCREENING: MRSA BY PCR: NEGATIVE

## 2017-01-24 MED ORDER — ALBUTEROL SULFATE HFA 108 (90 BASE) MCG/ACT IN AERS: 1.0000 | INHALATION_SPRAY | Freq: Four times a day (QID) | RESPIRATORY_TRACT | Status: DC | PRN

## 2017-01-24 MED ORDER — OXYCODONE HCL 5 MG PO TABS
5.0000 mg | ORAL_TABLET | ORAL | Status: DC | PRN
Start: 2017-01-24 — End: 2017-01-27
  Administered 2017-01-24 (×2): 5 mg via ORAL
  Filled 2017-01-24 (×3): qty 1

## 2017-01-24 MED ORDER — IOPAMIDOL (ISOVUE-300) INJECTION 61%
INTRAVENOUS | Status: AC
  Administered 2017-01-24: 30 mL
  Filled 2017-01-24: qty 30

## 2017-01-24 MED ORDER — EMTRICITABINE 200 MG PO CAPS
200.0000 mg | ORAL_CAPSULE | ORAL | Status: DC
  Administered 2017-01-25: 200 mg via ORAL
  Filled 2017-01-24: qty 1

## 2017-01-24 MED ORDER — ACETAMINOPHEN 325 MG PO TABS: 650.0000 mg | ORAL_TABLET | Freq: Four times a day (QID) | ORAL | Status: DC | PRN

## 2017-01-24 MED ORDER — RALTEGRAVIR POTASSIUM 400 MG PO TABS
400.0000 mg | ORAL_TABLET | Freq: Two times a day (BID) | ORAL | Status: DC
  Administered 2017-01-24 – 2017-01-27 (×6): 400 mg via ORAL
  Filled 2017-01-24 (×11): qty 1

## 2017-01-24 MED ORDER — CLOTRIMAZOLE 1 % EX CREA
TOPICAL_CREAM | Freq: Two times a day (BID) | CUTANEOUS | Status: DC
  Administered 2017-01-24 – 2017-01-26 (×4): via TOPICAL
  Filled 2017-01-24: qty 15

## 2017-01-24 MED ORDER — MORPHINE SULFATE (PF) 2 MG/ML IV SOLN
2.0000 mg | INTRAVENOUS | Status: DC | PRN
  Administered 2017-01-24 – 2017-01-25 (×6): 2 mg via INTRAVENOUS
  Filled 2017-01-24 (×6): qty 1

## 2017-01-24 MED ORDER — TENOFOVIR DISOPROXIL FUMARATE 300 MG PO TABS: 300.0000 mg | ORAL_TABLET | ORAL | Status: DC

## 2017-01-24 MED ORDER — ACETAMINOPHEN 650 MG RE SUPP: 650.0000 mg | Freq: Four times a day (QID) | RECTAL | Status: DC | PRN

## 2017-01-24 MED ORDER — IOPAMIDOL (ISOVUE-300) INJECTION 61%
100.0000 mL | Freq: Once | INTRAVENOUS | Status: AC | PRN
  Administered 2017-01-24: 100 mL via INTRAVENOUS

## 2017-01-24 MED ORDER — FENTANYL CITRATE (PF) 100 MCG/2ML IJ SOLN
50.0000 ug | Freq: Once | INTRAMUSCULAR | Status: AC
  Administered 2017-01-24: 50 ug via INTRAVENOUS
  Filled 2017-01-24: qty 2

## 2017-01-24 MED ORDER — SULFAMETHOXAZOLE-TRIMETHOPRIM 800-160 MG PO TABS
0.5000 | ORAL_TABLET | ORAL | Status: DC
  Filled 2017-01-24 (×2): qty 1

## 2017-01-24 MED ORDER — EPOETIN ALFA 4000 UNIT/ML IJ SOLN
4000.0000 [IU] | INTRAMUSCULAR | Status: DC
  Filled 2017-01-24: qty 1

## 2017-01-24 MED ORDER — SODIUM CHLORIDE 0.9 % IV SOLN
INTRAVENOUS | Status: DC
  Administered 2017-01-24: 10:00:00 via INTRAVENOUS

## 2017-01-24 MED ORDER — METOPROLOL SUCCINATE ER 50 MG PO TB24
75.0000 mg | ORAL_TABLET | Freq: Every day | ORAL | Status: DC
  Filled 2017-01-24: qty 1

## 2017-01-24 MED ORDER — POTASSIUM CHLORIDE CRYS ER 20 MEQ PO TBCR
40.0000 meq | EXTENDED_RELEASE_TABLET | Freq: Once | ORAL | Status: AC
  Administered 2017-01-24: 40 meq via ORAL
  Filled 2017-01-24: qty 2

## 2017-01-24 MED ORDER — ONDANSETRON HCL 4 MG/2ML IJ SOLN
4.0000 mg | Freq: Once | INTRAMUSCULAR | Status: AC
  Administered 2017-01-24: 4 mg via INTRAVENOUS
  Filled 2017-01-24: qty 2

## 2017-01-24 MED ORDER — ONDANSETRON HCL 4 MG/2ML IJ SOLN: 4.0000 mg | Freq: Four times a day (QID) | INTRAMUSCULAR | Status: DC | PRN

## 2017-01-24 MED ORDER — SODIUM CHLORIDE 0.9% FLUSH
3.0000 mL | Freq: Two times a day (BID) | INTRAVENOUS | Status: DC
  Administered 2017-01-24 – 2017-01-26 (×6): 3 mL via INTRAVENOUS

## 2017-01-24 MED ORDER — SULFAMETHOXAZOLE-TRIMETHOPRIM 400-80 MG PO TABS
1.0000 | ORAL_TABLET | ORAL | Status: DC
  Filled 2017-01-24 (×2): qty 1

## 2017-01-24 MED ORDER — ALBUTEROL SULFATE (2.5 MG/3ML) 0.083% IN NEBU: 2.5000 mg | INHALATION_SOLUTION | Freq: Four times a day (QID) | RESPIRATORY_TRACT | Status: DC | PRN

## 2017-01-24 MED ORDER — ETRAVIRINE 100 MG PO TABS
200.0000 mg | ORAL_TABLET | Freq: Two times a day (BID) | ORAL | Status: DC
  Administered 2017-01-24 – 2017-01-27 (×6): 200 mg via ORAL
  Filled 2017-01-24 (×12): qty 2

## 2017-01-24 MED ORDER — ONDANSETRON HCL 4 MG PO TABS: 4.0000 mg | ORAL_TABLET | Freq: Four times a day (QID) | ORAL | Status: DC | PRN

## 2017-01-24 NOTE — ED Provider Notes (Signed)
[facility]-EMERGENCY DEPT Provider Note   CSN: [id number] Arrival date & time: 01/23/17  2120    By signing my name below, I, Valentino Saxon, attest that this documentation has been prepared under the direction and in the presence of Devoria Albe, MD. Electronically Signed: Valentino Saxon, ED Scribe. 01/24/17. 12:47 AM.  Time seen 12:36 PM   History   Chief Complaint Chief Complaint  Patient presents with  . Abdominal Pain   The history is provided by the patient. No language interpreter was used.   HPI Comments: Dave Estrada is a 26 y.o. male with PMHx of HIV and ESRD, who presents to the Emergency Department complaining of 10/10, moderate, constant, upper and lower abdominal pain onset yesterday. Pt describes his pain as " if someone is pressing on my stomach". Pt reports associated nausea, induced vomiting and  Diarrhea x 3-4 episodes. Pt notes his pain is briefly relieved with vomiting. He notes his abdominal pain is exacerbated with hiccupinig, belching and eating. He reports the pain gets worse immediately after eating.  Pt reports having ~3-4 episodes of self-induced vomiting throughout the day yesterday. He also notes having chills. No alleviating factors noted. Pt reports having his dialysis sessions on Tuesdays earlier today, Thursday's and Saturday's. Pt reports recent sick contact. Pt denies fevers. He also denies consumption of alcohol. Patient states he was just discharged from the hospital on April 9 after being admitted for sepsis and having to have his graft removed. He has a dialysis catheter in his left chest.  PCP- Dr. Zackery Barefoot   Past Medical History:  Diagnosis Date  . A-V fistula (HCC)    Placed on 10/27/2014 at [facility]; for future HD use.   . Asthma   . ESRD (end stage renal disease) (HCC)   . Heart attack   . HIV (human immunodeficiency virus infection) (HCC)   . Staphylococcus aureus bacteremia with sepsis (HCC) 09/09/2014  . Systolic dysfunction, left  ventricle 12/20/2014   EF 20%     Patient Active Problem List   Diagnosis Date Noted  . Pancreatitis 01/24/2017  . Sepsis, unspecified organism (HCC) 07/18/2016  . Inguinal adenopathy 07/18/2016  . AIDS (acquired immune deficiency syndrome) (HCC)   . Hyperbilirubinemia   . ESRD needing dialysis (HCC) 06/11/2016  . Dyspnea 06/11/2016  . Chronic systolic heart failure (HCC) 01/13/2016  . Nonischemic cardiomyopathy (HCC) 11/27/2015  . Hemoptysis 09/11/2015  . Cough 09/11/2015  . SOB (shortness of breath) 09/11/2015  . Acute respiratory failure (HCC) 09/11/2015  . Elevated troponin 09/11/2015  . Protein-calorie malnutrition, severe (HCC) 09/11/2015  . Pneumonia 09/11/2015  . Community acquired pneumonia   . Acute on chronic systolic CHF (congestive heart failure) (HCC) 12/20/2014  . A-V fistula (HCC) 12/18/2014  . Thrombocytopenia, acquired (HCC) 12/18/2014  . Other infectious complication of vascular dialysis catheter 12/17/2014  . 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 Pyrexia   . Vomiting and diarrhea   . AIDS (HCC)   . Macrocytic anemia 09/10/2014  . Cigarette smoker 09/10/2014  . Liver lesion 09/10/2014  . Asthma, chronic 09/10/2014  . History of MRSA infection 09/10/2014  . MRSA bacteremia 09/10/2014  . Staphylococcus aureus bacteremia with sepsis (HCC) 09/09/2014  . HIV (human immunodeficiency virus infection) (HCC) 09/08/2014  . ESRD on hemodialysis (HCC) 09/08/2014    Past Surgical History:  Procedure Laterality Date  . INSERTION OF DIALYSIS CATHETER Left 09/15/2014   Procedure: INSERTION OF DIALYSIS CATHETER;  Surgeon: Nada Libman, MD;  Location: [facility] OR;  Service: Vascular;  Laterality: Left;  . PORT A CATH REVISION    . TEE WITHOUT CARDIOVERSION N/A 09/14/2014   Procedure: TRANSESOPHAGEAL ECHOCARDIOGRAM (TEE);  Surgeon: Laurey Morale, MD;  Location: [facility] ENDOSCOPY;  Service: Cardiovascular;  Laterality: N/A;  will be at [facility] by mon        Home Medications    Prior to Admission medications   Medication Sig Start Date End Date Taking? Authorizing Provider  albuterol (PROVENTIL HFA;VENTOLIN HFA) 108 (90 BASE) MCG/ACT inhaler Inhale 1-2 puffs into the lungs every 6 (six) hours as needed for wheezing or shortness of breath.    Historical Provider, MD  clotrimazole (LOTRIMIN) 1 % cream Apply topically 2 (two) times daily. 07/22/16   Waltner Cloud, MD  emtricitabine (EMTRIVA) 200 MG capsule Take 200 mg by mouth 2 (two) times a week. Taken after dialysis on Tuesday and Thursday    Historical Provider, MD  etravirine (INTELENCE) 100 MG tablet Take 200 mg by mouth every 12 (twelve) hours.    Historical Provider, MD  metoprolol succinate (TOPROL-XL) 50 MG 24 hr tablet Take 75 mg by mouth daily. Take 1 1/2 tablets daily    Historical Provider, MD  raltegravir (ISENTRESS) 400 MG tablet Take 400 mg by mouth 2 (two) times daily.    Historical Provider, MD  sulfamethoxazole-trimethoprim (BACTRIM,SEPTRA) 400-80 MG tablet Take 1 tablet by mouth 3 (three) times a week. To take after dialysis days    Historical Provider, MD  tenofovir (VIREAD) 300 MG tablet Take 300 mg by mouth once a week. To be taken on Tuesday after dialysis    Historical Provider, MD    Family History Family History  Problem Relation Age of Onset  . Seizures Father     Social History Social History  Substance Use Topics  . Smoking status: Current Every Day Smoker    Packs/day: 0.50    Years: 11.00    Types: Cigarettes    Start date: 01/28/2003  . Smokeless tobacco: Never Used  . Alcohol use No     Allergies   Bee venom; Other; and Aspirin   Review of Systems Review of Systems  Constitutional: Positive for chills. Negative for fever.  Gastrointestinal: Positive for abdominal pain, diarrhea, nausea and vomiting.  All other systems reviewed and are negative.    Physical Exam Updated Vital Signs BP 93/71   Pulse (!) 111   Temp 98.1  F (36.7 C)   Resp 18   Wt 120 lb (54.4 kg)   SpO2 100%   BMI 19.97 kg/m   Vital signs normal hypotension and tachycardia   Physical Exam  Constitutional: He is oriented to person, place, and time.  Non-toxic appearance. He does not appear ill. No distress.  He appears chronically ill. He is thin.   HENT:  Head: Normocephalic and atraumatic.  Right Ear: External ear normal.  Left Ear: External ear normal.  Nose: Nose normal. No mucosal edema or rhinorrhea.  Mouth/Throat: Oropharynx is clear and moist and mucous membranes are normal. No dental abscesses or uvula swelling.  Tongue is moist.   Eyes: Conjunctivae and EOM are normal. Pupils are equal, round, and reactive to light.  Neck: Normal range of motion and full passive range of motion without pain. Neck supple.  Cardiovascular: Regular rhythm and normal heart sounds.  Tachycardia present.  Exam reveals no gallop and no friction rub.   No murmur heard. Pulmonary/Chest: Effort normal and breath sounds normal. No respiratory  distress. He has no wheezes. He has no rhonchi. He has no rales. He exhibits no tenderness and no crepitus.  He has dialysis catheter in left upper chest.   Abdominal: Soft. Normal appearance and bowel sounds are normal. He exhibits no distension. There is no tenderness. There is no rebound and no guarding.    Tender in epigastric and bilateral upper abdomen. Minimally tender in lower abdomen.   Musculoskeletal: Normal range of motion. He exhibits no edema or tenderness.  Moves all extremities well. He has a dressing on left upper arm where he had his graft removed.   Neurological: He is alert and oriented to person, place, and time. He has normal strength. No cranial nerve deficit.  Skin: Skin is warm, dry and intact. No rash noted. No erythema. No pallor.  Psychiatric: He has a normal mood and affect. His speech is normal and behavior is normal. His mood appears not anxious.  Nursing note and vitals  reviewed.    ED Treatments / Results   DIAGNOSTIC STUDIES: Oxygen Saturation is 100% on RA, normal by my interpretation.    Labs (all labs ordered are listed, but only abnormal results are displayed) Results for orders placed or performed during the hospital encounter of 01/24/17  Lipase, blood  Result Value Ref Range   Lipase 549 (H) 11 - 51 U/L  Comprehensive metabolic panel  Result Value Ref Range   Sodium 132 (L) 135 - 145 mmol/L   Potassium 3.1 (L) 3.5 - 5.1 mmol/L   Chloride 91 (L) 101 - 111 mmol/L   CO2 30 22 - 32 mmol/L   Glucose, Bld 107 (H) 65 - 99 mg/dL   BUN 13 6 - 20 mg/dL   Creatinine, Ser 1.61 (H) 0.61 - 1.24 mg/dL   Calcium 8.3 (L) 8.9 - 10.3 mg/dL   Total Protein 8.7 (H) 6.5 - 8.1 g/dL   Albumin 2.7 (L) 3.5 - 5.0 g/dL   AST 28 15 - 41 U/L   ALT 6 (L) 17 - 63 U/L   Alkaline Phosphatase 132 (H) 38 - 126 U/L   Total Bilirubin 1.9 (H) 0.3 - 1.2 mg/dL   GFR calc non Af Amer 22 (L) >60 mL/min   GFR calc Af Amer 26 (L) >60 mL/min   Anion gap 11 5 - 15  CBC  Result Value Ref Range   WBC 9.4 4.0 - 10.5 K/uL   RBC 2.75 (L) 4.22 - 5.81 MIL/uL   Hemoglobin 9.6 (L) 13.0 - 17.0 g/dL   HCT 09.6 (L) 04.5 - 40.9 %   MCV 108.4 (H) 78.0 - 100.0 fL   MCH 34.9 (H) 26.0 - 34.0 pg   MCHC 32.2 30.0 - 36.0 g/dL   RDW 81.1 (H) 91.4 - 78.2 %   Platelets 302 150 - 400 K/uL   Laboratory interpretation all normal except Markedly elevated lipase even compared to prior values, renal failure, stable anemia, hypokalemia,     EKG  EKG Interpretation None       Radiology Ct Abdomen Pelvis W Contrast  Result Date: 01/24/2017 CLINICAL DATA:  Moderate constant generalized abdominal pain onset yesterday. History of HIV and end-stage renal disease. EXAM: CT ABDOMEN AND PELVIS WITH CONTRAST TECHNIQUE: Multidetector CT imaging of the abdomen and pelvis was performed using the standard protocol following bolus administration of intravenous contrast. CONTRAST:  30mL ISOVUE-300  IOPAMIDOL (ISOVUE-300) INJECTION 61%, ISOVUE-300 IOPAMIDOL (ISOVUE-300) INJECTION 61% COMPARISON:  11/29/2015 CT FINDINGS: Lower chest: Cardiomegaly with small pericardial effusion  measuring up to 12 mm in thickness along the right lateral and posterior aspect of the heart. Mild bullous changes at the lung bases. No pneumonic consolidation or effusion. Hepatobiliary: There is heterogeneous enhancement of the liver with changes secondary to passive congestion. No space-occupying mass noted. The gallbladder is nondistended. No cholelithiasis. Pancreas:  The pancreas is unremarkable.  No ductal dilatation mass. Spleen: Inhomogeneous appearance of the pancreas without discrete mass identified. No splenomegaly. Adrenals/Urinary Tract: Normal bilateral adrenal glands. Atrophic kidneys bilaterally. Nonobstructing 3 mm right lower pole renal calculus. No space-occupying mass of the kidneys. No hydroureteronephrosis. Urinary bladder is nondistended. Stomach/Bowel: Contrast distention of the stomach. The proximal small intestine appears mildly edematous with fluid-filled distention of more mid to distal small intestine. Liquid stool within the cecum through ascending colon without mural thickening noted. Appendix is normal. Vascular/Lymphatic: No significant vascular findings are present. No enlarged abdominal or pelvic lymph nodes. Reproductive: Prostate is unremarkable. Other: Moderate volume of ascites in the pelvis. Mild mesenteric edema. Musculoskeletal: No acute or significant osseous findings.Mild anasarca. IMPRESSION: 1. Moderate volume of ascites predominantly in the lower pelvis with mesenteric edema and anasarca. Findings are likely related to third spacing fluid. 2. Passive congestion of the liver and possibly at the spleen accounting for their inhomogeneous appearance. 3. Suspect moderate diffuse small bowel enteritis and diarrheal disease of large bowel. No bowel obstruction. 4. Stable cardiomegaly with  small pericardial effusion. Electronically Signed   By: Tollie Eth M.D.   On: 01/24/2017 03:36    Procedures Procedures (including critical care time)  Medications Ordered in ED Medications  fentaNYL (SUBLIMAZE) injection 50 mcg (50 mcg Intravenous Given 01/24/17 0121)  ondansetron (ZOFRAN) injection 4 mg (4 mg Intravenous Given 01/24/17 0120)  iopamidol (ISOVUE-300) 61 % injection (30 mLs  Contrast Given 01/24/17 0304)  iopamidol (ISOVUE-300) 61 % injection 100 mL (100 mLs Intravenous Contrast Given 01/24/17 0304)  fentaNYL (SUBLIMAZE) injection 50 mcg (50 mcg Intravenous Given 01/24/17 0258)     Initial Impression / Assessment and Plan / ED Course  I have reviewed the triage vital signs and the nursing notes.  Pertinent labs & imaging results that were available during my care of the patient were reviewed by me and considered in my medical decision making (see chart for details).  COORDINATION OF CARE: 12:45 AM Discussed treatment plan with pt at bedside which includes labs, pain and nausea medication and pt agreed to plan.  Of note there are many people in the ED tonight with vomiting and diarrhea. However patient's lipase is extremely elevated. CT scan was done to to check for pancreatitis. CT scan does not show acute pancreatitis. However patient continues to complain of pain.   Patient was given nausea and pain medication. His pain medicine had to be repeated. When I rechecked him at 6 AM he's noted to be laying flat on the stretcher on his abdomen. He states he still having nausea and abdominal pain. We discussed his CT results. I'm going to talk to the hospitalist about admission.   06:32 AM Dr Sharl Ma will admit  Final Clinical Impressions(s) / ED Diagnoses   Final diagnoses:  Nausea vomiting and diarrhea  Pain of upper abdomen  Serum lipase elevation    Plan discharge  Devoria Albe, MD, FACEP  I personally performed the services described in this documentation, which was  scribed in my presence. The recorded information has been reviewed and considered.  Devoria Albe, MD, Concha Pyo, MD 01/24/17 [phone]

## 2017-01-24 NOTE — Consult Note (Addendum)
Dave Estrada MRN: [id number] DOB/AGE: 02-08-1991 26 y.o. Primary Care Physician:JASON [name], MD Admit date: 01/24/2017 Chief Complaint:  Chief Complaint  Patient presents with  . Abdominal Pain   HPI: Pt is 26 year old male with past medical hx of ESRD who presented to Emergency room with c/o abdominal pain.  HPI dates back to 1- 2 days ago when pt started having abdominal pain, generalized,constant, upper and lower abdominal in location.Patient was recently treated at [facility] for clotted/infected left upper extremity graft subsequently getting excised and permacath was placed. Pt was discharged on 01/22/2017. Patient states since his discharge he started having abdominal pain . NO c/o chest pain No c/o dyspnea No c/o nausea/ vomiting No c/o syncope No c/o recent travel Pt seen on 3rd floor.Pt says "I still have pain"  Pt last Dialysis was on Tuesday  Past Medical History:  Diagnosis Date  . A-V fistula (HCC)    Placed on 10/27/2014 at [facility]; for future HD use.   . Asthma   . ESRD (end stage renal disease) (HCC)   . Heart attack   . HIV (human immunodeficiency virus infection) (HCC)   . Staphylococcus aureus bacteremia with sepsis (HCC) 09/09/2014  . Systolic dysfunction, left ventricle 12/20/2014   EF 20%         Family History  Problem Relation Age of Onset  . Seizures Father     Social History:  reports that he has been smoking Cigarettes.  He started smoking about 14 years ago. He has a 5.50 pack-year smoking history. He has never used smokeless tobacco. He reports that he uses drugs, including Marijuana. He reports that he does not drink alcohol.  Allergies:  Allergies  Allergen Reactions  . Bee Venom Anaphylaxis  . Other Anaphylaxis    mushrooms  . Aspirin Other (See Comments)    Reaction is unknown    Medications Prior to Admission  Medication Sig Dispense Refill  . acetaminophen (TYLENOL) 325 MG tablet Take 3 tablets by mouth 3 (three) times daily.     Marland Kitchen albuterol (PROVENTIL HFA;VENTOLIN HFA) 108 (90 BASE) MCG/ACT inhaler Inhale 1-2 puffs into the lungs every 6 (six) hours as needed for wheezing or shortness of breath.    . clotrimazole (LOTRIMIN) 1 % cream Apply topically 2 (two) times daily. 30 g 0  . emtricitabine (EMTRIVA) 200 MG capsule Take 200 mg by mouth 2 (two) times a week. Taken after dialysis on Tuesday and Thursday    . etravirine (INTELENCE) 100 MG tablet Take 200 mg by mouth every 12 (twelve) hours.    Marland Kitchen HYDROmorphone (DILAUDID) 2 MG tablet Take 1-2 tablets by mouth every 4 (four) hours as needed for pain.    . metoprolol succinate (TOPROL-XL) 50 MG 24 hr tablet Take 50 mg by mouth daily. Take 1 1/2 tablets daily     . pantoprazole (PROTONIX) 40 MG tablet Take 1 tablet by mouth daily.    . raltegravir (ISENTRESS) 400 MG tablet Take 400 mg by mouth 2 (two) times daily.    Marland Kitchen sulfamethoxazole-trimethoprim (BACTRIM,SEPTRA) 400-80 MG tablet Take 1 tablet by mouth 3 (three) times a week. To take after dialysis days    . tenofovir (VIREAD) 300 MG tablet Take 300 mg by mouth once a week. To be taken on Tuesday after dialysis         WUJ:WJXBJ from the symptoms mentioned above,there are no other symptoms referable to all systems reviewed.  . clotrimazole   Topical BID  . [  START ON 01/25/2017] emtricitabine  200 mg Oral Once per day on Tue Thu  . etravirine  200 mg Oral Q12H  . metoprolol succinate  75 mg Oral Daily  . raltegravir  400 mg Oral BID  . sodium chloride flush  3 mL Intravenous Q12H  . sulfamethoxazole-trimethoprim  0.5 tablet Oral Q M,W,F-HD  . [START ON 01/30/2017] tenofovir  300 mg Oral Once per day on Tue     Physical Exam: Vital signs in last 24 hours: Temp:  [97.8 F (36.6 C)-98.1 F (36.7 C)] 98 F (36.7 C) (04/11 1300) Pulse Rate:  [79-111] 100 (04/11 1300) Resp:  [16-18] 18 (04/11 1300) BP: (93-106)/(67-84) 106/76 (04/11 1300) SpO2:  [97 %-100 %] 98 % (04/11 1300) Weight:  [116 lb 13.5 oz (53 kg)-120  lb (54.4 kg)] 116 lb 13.5 oz (53 kg) (04/11 0815) Weight change:  Last BM Date:  (unknown; pt can't remember)  Intake/Output from previous day: No intake/output data recorded. Total I/O In: 240 [P.O.:240] Out: -    Physical Exam: General- pt is awake,alert, oriented to time place and person Resp- No acute REsp distress, CTA B/L NO Rhonchi CVS- S1S2 regular in rate and rhythm GIT- BS+, soft, NT, ND EXT- NO LE Edema, Cyanosis CNS- CN 2-12 grossly intact. Moving all 4 extremities Psych- normal mood and affect Access- PC     Lab Results: CBC  Recent Labs  01/23/17 2209  WBC 9.4  HGB 9.6*  HCT 29.8*  PLT 302    BMET  Recent Labs  01/23/17 2209  NA 132*  K 3.1*  CL 91*  CO2 30  GLUCOSE 107*  BUN 13  CREATININE 3.54*  CALCIUM 8.3*    MICRO Recent Results (from the past 240 hour(s))  MRSA PCR Screening     Status: None   Collection Time: 01/24/17 11:03 AM  Result Value Ref Range Status   MRSA by PCR NEGATIVE NEGATIVE Final    Comment:        The GeneXpert MRSA Assay (FDA approved for NASAL specimens only), is one component of a comprehensive MRSA colonization surveillance program. It is not intended to diagnose MRSA infection nor to guide or monitor treatment for MRSA infections.       Lab Results  Component Value Date   CALCIUM 8.3 (L) 01/23/2017   PHOS 2.4 (L) 07/19/2016      Impression: 1)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               Pt is On Tues/Thurs/Satur schedule               Pt last HD session was on saturday                Will dialyze in am                Pt with PC/tunnelled cath                  Multiple AVF failed to mature  2)HTN BP stable   3)Anemia In ESRD the goal for HGb is 9--11. Will keep on  Epo   4)CKD Mineral-Bone Disorder Calcium Near to goal when corrected for low albumin Phos not at goal, on lower side  5)ID- hx of HIV On HAART  6)Electrolytes   Hypokalemic  Hyponatremic  7)Acid base Co2  at goal   8)  GI-admitted with Abdominal pain Primary team following   Plan:  NO need of HD today Will dialyze again in  am Will use 3 k bath Will keep on epo    Ragan Reale S 01/24/2017, 3:00 PM

## 2017-01-24 NOTE — ED Notes (Signed)
Pt states he does not need to urinate at this time.  

## 2017-01-24 NOTE — H&P (Signed)
History and Physical    Dave Estrada ZOX:[id number] DOB: 1990-11-27 DOA: 01/24/2017  PCP: Dave Estrada Dave STOUT, MD Patient coming from: Home  Chief Complaint: Abdominal pain  HPI: Dave Estrada is a 26 y.o. male with medical history significant of ESRD on dialysis Tuesday Thursday Saturday, systolic CHF, HIV comes to the hospital with the complaints of abdominal pain. Patient was recently treated at [facility] for clotted/infected left upper extremity graft subsequently getting excised and permacath was placed. He was on antibiotics Vanco and Zosyn at first which was later transitioned to cefazolin. He was discharged on 01/22/2017. Patient states since his discharge he has been having abdominal pain which is mostly constant cramping/sharp in nature along with feeling of nausea and nonbloody vomiting. No vomiting in the last 24 hours as he has not had much to eat but now he also has nonbloody watery diarrhea as well. States he felt warm at home but did not check his temperature and denies chills. Never had such symptoms in the past. Denies eating anything out of unusual as well. Denies any drinking, alcohol and tobacco use. He does smoke marijuana. No other complaints and states he is compliant with his HIV regimen.  In the ER he was noted to have elevated lipase, slight elevation of ALP and a CT abdomen and pelvis was done. CT of the abdomen pelvis showed; -Moderate volume ascites with mesenteric edema and anasarca -Passive congestion of liver and spleen -Moderate diffuse small bowel enteritis -Small pericardial effusion with stable cardiomegaly  He was given fentanyl for pain, Zofran IV fluids.   Review of Systems: As per HPI otherwise 10 point review of systems negative.   Past Medical History:  Diagnosis Date  . A-V fistula (HCC)    Placed on 10/27/2014 at [facility]; for future HD use.   . Asthma   . ESRD (end stage renal disease) (HCC)   . Heart attack   . HIV (human immunodeficiency  virus infection) (HCC)   . Staphylococcus aureus bacteremia with sepsis (HCC) 09/09/2014  . Systolic dysfunction, left ventricle 12/20/2014   EF 20%     Past Surgical History:  Procedure Laterality Date  . INSERTION OF DIALYSIS CATHETER Left 09/15/2014   Procedure: INSERTION OF DIALYSIS CATHETER;  Surgeon: Nada Libman, MD;  Location: [facility] OR;  Service: Vascular;  Laterality: Left;  . PORT A CATH REVISION    . TEE WITHOUT CARDIOVERSION N/A 09/14/2014   Procedure: TRANSESOPHAGEAL ECHOCARDIOGRAM (TEE);  Surgeon: Laurey Morale, MD;  Location: [facility] ENDOSCOPY;  Service: Cardiovascular;  Laterality: N/A;  will be at [facility] by mon     reports that he has been smoking Cigarettes.  He started smoking about 14 years ago. He has a 5.50 pack-year smoking history. He has never used smokeless tobacco. He reports that he uses drugs, including Marijuana. He reports that he does not drink alcohol.  Allergies  Allergen Reactions  . Bee Venom Anaphylaxis  . Other Anaphylaxis    mushrooms  . Aspirin Other (See Comments)    Reaction is unknown    Family History  Problem Relation Age of Onset  . Seizures Father     Acceptable: Family history reviewed and not pertinent (If you reviewed it)  Prior to Admission medications   Medication Sig Start Date End Date Taking? Authorizing Provider  albuterol (PROVENTIL HFA;VENTOLIN HFA) 108 (90 BASE) MCG/ACT inhaler Inhale 1-2 puffs into the lungs every 6 (six) hours as needed for wheezing or shortness of breath.  Historical Provider, MD  clotrimazole (LOTRIMIN) 1 % cream Apply topically 2 (two) times daily. 07/22/16   [place]qu[place] Cloud, MD  emtricitabine (EMTRIVA) 200 MG capsule Take 200 mg by mouth 2 (two) times a week. Taken after dialysis on Tuesday and Thursday    Historical Provider, MD  etravirine (INTELENCE) 100 MG tablet Take 200 mg by mouth every 12 (twelve) hours.    Historical Provider, MD  metoprolol succinate (TOPROL-XL) 50 MG 24 hr tablet Take  75 mg by mouth daily. Take 1 1/2 tablets daily    Historical Provider, MD  raltegravir (ISENTRESS) 400 MG tablet Take 400 mg by mouth 2 (two) times daily.    Historical Provider, MD  sulfamethoxazole-trimethoprim (BACTRIM,SEPTRA) 400-80 MG tablet Take 1 tablet by mouth 3 (three) times a week. To take after dialysis days    Historical Provider, MD  tenofovir (VIREAD) 300 MG tablet Take 300 mg by mouth once a week. To be taken on Tuesday after dialysis    Historical Provider, MD    Physical Exam: Vitals:   01/23/17 2133 01/24/17 0241 01/24/17 0643 01/24/17 0717  BP:  106/84 100/71 99/78  Pulse:  (!) 103 79 100  Resp:  16 16 18   Temp:    97.8 F (36.6 C)  TempSrc:    Oral  SpO2:  97% 99% 100%  Weight: 54.4 kg (120 lb)         Constitutional: mild distress due to abd pain  Vitals:   01/23/17 2133 01/24/17 0241 01/24/17 0643 01/24/17 0717  BP:  106/84 100/71 99/78  Pulse:  (!) 103 79 100  Resp:  16 16 18   Temp:    97.8 F (36.6 C)  TempSrc:    Oral  SpO2:  97% 99% 100%  Weight: 54.4 kg (120 lb)      Eyes: PERRL, lids and conjunctivae normal ENMT: Mucous membranes are moist. Posterior pharynx clear of any exudate or lesions.Normal dentition.  Neck: normal, supple, no masses, no thyromegaly Respiratory: clear to auscultation bilaterally, no wheezing, no crackles. Normal respiratory effort. No accessory muscle use.  Cardiovascular: Regular rate and rhythm, no murmurs / rubs / gallops. No extremity edema. 2+ pedal pulses. No carotid bruits.  Abdomen:  tenderness, to palpation with some guarding, no rebound tenderness no masses palpated. No hepatosplenomegaly. Bowel sounds positive.  Musculoskeletal: no clubbing / cyanosis. No joint deformity upper and lower extremities. Good ROM, no contractures. Normal muscle tone.  Skin: no rashes, lesions, ulcers. No induration Neurologic: CN 2-12 grossly intact. Sensation intact, DTR normal. Strength 5/5 in all 4.  Psychiatric: Normal judgment  and insight. Alert and oriented x 3. Normal mood.  Left upper extremity dressing from recent graft removal. Left chest wall dialysis catheter in place.   Labs on Admission: I have personally reviewed following labs and imaging studies  CBC:  Recent Labs Lab 01/23/17 2209  WBC 9.4  HGB 9.6*  HCT 29.8*  MCV 108.4*  PLT 302   Basic Metabolic Panel:  Recent Labs Lab 01/23/17 2209  NA 132*  K 3.1*  CL 91*  CO2 30  GLUCOSE 107*  BUN 13  CREATININE 3.54*  CALCIUM 8.3*   GFR: Estimated Creatinine Clearance: 24.1 mL/min (A) (by C-G formula based on SCr of 3.54 mg/dL (H)). Liver Function Tests:  Recent Labs Lab 01/23/17 2209  AST 28  ALT 6*  ALKPHOS 132*  BILITOT 1.9*  PROT 8.7*  ALBUMIN 2.7*    Recent Labs Lab 01/23/17 2209  LIPASE  549*   No results for input(s): AMMONIA in the last 168 hours. Coagulation Profile: No results for input(s): INR, PROTIME in the last 168 hours. Cardiac Enzymes: No results for input(s): CKTOTAL, CKMB, CKMBINDEX, TROPONINI in the last 168 hours. BNP (last 3 results) No results for input(s): PROBNP in the last 8760 hours. HbA1C: No results for input(s): HGBA1C in the last 72 hours. CBG: No results for input(s): GLUCAP in the last 168 hours. Lipid Profile: No results for input(s): CHOL, HDL, LDLCALC, TRIG, CHOLHDL, LDLDIRECT in the last 72 hours. Thyroid Function Tests: No results for input(s): TSH, T4TOTAL, FREET4, T3FREE, THYROIDAB in the last 72 hours. Anemia Panel: No results for input(s): VITAMINB12, FOLATE, FERRITIN, TIBC, IRON, RETICCTPCT in the last 72 hours. Urine analysis:    Component Value Date/Time   COLORURINE AMBER (A) 07/18/2016 0123   APPEARANCEUR HAZY (A) 07/18/2016 0123   LABSPEC 1.015 07/18/2016 0123   PHURINE 7.5 07/18/2016 0123   GLUCOSEU 100 (A) 07/18/2016 0123   HGBUR MODERATE (A) 07/18/2016 0123   BILIRUBINUR MODERATE (A) 07/18/2016 0123   KETONESUR NEGATIVE 07/18/2016 0123   PROTEINUR >300 (A)  07/18/2016 0123   UROBILINOGEN 0.2 12/17/2014 0017   NITRITE NEGATIVE 07/18/2016 0123   LEUKOCYTESUR SMALL (A) 07/18/2016 0123   Sepsis Labs: !!!!!!!!!!!!!!!!!!!!!!!!!!!!!!!!!!!!!!!!!!!! @LABRCNTIP (procalcitonin:4,lacticidven:4) )No results found for this or any previous visit (from the past 240 hour(s)).   Radiological Exams on Admission: Ct Abdomen Pelvis W Contrast  Result Date: 01/24/2017 CLINICAL DATA:  Moderate constant generalized abdominal pain onset yesterday. History of HIV and end-stage renal disease. EXAM: CT ABDOMEN AND PELVIS WITH CONTRAST TECHNIQUE: Multidetector CT imaging of the abdomen and pelvis was performed using the standard protocol following bolus administration of intravenous contrast. CONTRAST:  30mL ISOVUE-300 IOPAMIDOL (ISOVUE-300) INJECTION 61%, ISOVUE-300 IOPAMIDOL (ISOVUE-300) INJECTION 61% COMPARISON:  11/29/2015 CT FINDINGS: Lower chest: Cardiomegaly with small pericardial effusion measuring up to 12 mm in thickness along the right lateral and posterior aspect of the heart. Mild bullous changes at the lung bases. No pneumonic consolidation or effusion. Hepatobiliary: There is heterogeneous enhancement of the liver with changes secondary to passive congestion. No space-occupying mass noted. The gallbladder is nondistended. No cholelithiasis. Pancreas:  The pancreas is unremarkable.  No ductal dilatation mass. Spleen: Inhomogeneous appearance of the pancreas without discrete mass identified. No splenomegaly. Adrenals/Urinary Tract: Normal bilateral adrenal glands. Atrophic kidneys bilaterally. Nonobstructing 3 mm right lower pole renal calculus. No space-occupying mass of the kidneys. No hydroureteronephrosis. Urinary bladder is nondistended. Stomach/Bowel: Contrast distention of the stomach. The proximal small intestine appears mildly edematous with fluid-filled distention of more mid to distal small intestine. Liquid stool within the cecum through ascending colon  without mural thickening noted. Appendix is normal. Vascular/Lymphatic: No significant vascular findings are present. No enlarged abdominal or pelvic lymph nodes. Reproductive: Prostate is unremarkable. Other: Moderate volume of ascites in the pelvis. Mild mesenteric edema. Musculoskeletal: No acute or significant osseous findings.Mild anasarca. IMPRESSION: 1. Moderate volume of ascites predominantly in the lower pelvis with mesenteric edema and anasarca. Findings are likely related to third spacing fluid. 2. Passive congestion of the liver and possibly at the spleen accounting for their inhomogeneous appearance. 3. Suspect moderate diffuse small bowel enteritis and diarrheal disease of large bowel. No bowel obstruction. 4. Stable cardiomegaly with small pericardial effusion. Electronically Signed   By: Tollie Eth M.D.   On: 01/24/2017 03:36    EKG: Independently reviewed.   Assessment/Plan Active Problems:   Pancreatitis    Abdominal pain/nausea/vomiting and  diarrhea-small bowel enteritis -Admit for further care. Recently treated with vancomycin and Zosyn. Currently on cefazolin which was started at [facility] with plans to stop it on 02/15/2017. -He is currently immunosuppressed given his HIV status -Check for stool studies-rule out C. difficile, GI panel, cryptosporidium, microsporidia and check for WBCs in stool -Pain control, antiemetics -Hold off on aggressive IV fluid as he is a dialysis patient -No antibiotics at this time but will closely monitor. Low threshold for starting antibiotics -Advance diet as tolerated. -Slight elevation of lipase-possible very mild pancreatitis  ESRD on hemodialysis -On schedule for Tuesday, Thursday and Saturday -Consult for nephrology placed -Monitor electrolytes and volume status -Recently and left upper extremity graft excised at [facility] on 01/18/2017. Now he has a tunnel catheter in the left chest wall.  Hypokalemia -We'll order for 1 time potassium 40 mEq  by mouth -Monitor closely. Will recheck it tomorrow and his diaosylate can be adjusted accordingly  Mild elevation of ALP -Possible due to some hepatic congestion/steatosis from cardiac problems.  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  HIV last viral load 52K, CD4 378 -on Intelence, Isentress, Viread and Bactrim DS as prophylaxis  Substance abuse -History of cocaine and marijuana use  Skin disorder -Prurigo/molluscum and warts/eczema -Continue topical creams  Macrocytic anemia -Normal B12 and folate    DVT prophylaxis: SCDs Code Status: Full Family Communication: None present at bedside Disposition Plan: To be determined Consults called: Nephrology Admission status: I will change his status to inpatient given multiple comorbidities and immunosuppressed.  It is my clinical opinion that admission to INPATIENT is reasonable and necessary in this 26 y.o. male . presenting with symptoms of abd pain, diarrhea and nausea, concerning for infectious etiology in the setting of HIV . in the context of PMH including: HIV, Systolic CHF, ESRD on HD . with pertinent positives on physical exam including: tenderness to palpation of his abd  . and pertinent positives on radiographic and laboratory data including: CT shows small bowel enteritis. Elevated ALP and lipase on the lab  . Workup and treatment include Bowel rest, checking several stool studies in the setting of HIV and recent Abx tx.   Given the aforementioned, the predictability of an adverse outcome is felt to be significant. I expect that the patient will require at least 2 midnights in the hospital to treat this condition.    Lataysha Vohra Joline Maxcy MD [facility]  If 7PM-7AM, please contact  night-coverage [email or web address] Password [facility]  01/24/2017, 9:38 AM

## 2017-01-25 DIAGNOSIS — K859 Acute pancreatitis without necrosis or infection, unspecified: Principal | ICD-10-CM

## 2017-01-25 DIAGNOSIS — K529 Noninfective gastroenteritis and colitis, unspecified: Secondary | ICD-10-CM

## 2017-01-25 DIAGNOSIS — N186 End stage renal disease: Secondary | ICD-10-CM

## 2017-01-25 DIAGNOSIS — B20 Human immunodeficiency virus [HIV] disease: Secondary | ICD-10-CM

## 2017-01-25 DIAGNOSIS — I5022 Chronic systolic (congestive) heart failure: Secondary | ICD-10-CM

## 2017-01-25 DIAGNOSIS — Z992 Dependence on renal dialysis: Secondary | ICD-10-CM

## 2017-01-25 LAB — COMPREHENSIVE METABOLIC PANEL
ALT: 6 U/L — AB (ref 17–63)
ANION GAP: 10 (ref 5–15)
AST: 30 U/L (ref 15–41)
Albumin: 2.4 g/dL — ABNORMAL LOW (ref 3.5–5.0)
Alkaline Phosphatase: 119 U/L (ref 38–126)
BUN: 24 mg/dL — ABNORMAL HIGH (ref 6–20)
CHLORIDE: 93 mmol/L — AB (ref 101–111)
CO2: 28 mmol/L (ref 22–32)
Calcium: 8.1 mg/dL — ABNORMAL LOW (ref 8.9–10.3)
Creatinine, Ser: 5.73 mg/dL — ABNORMAL HIGH (ref 0.61–1.24)
GFR, EST AFRICAN AMERICAN: 14 mL/min — AB (ref >60)
GFR, EST NON AFRICAN AMERICAN: 12 mL/min — AB (ref >60)
Glucose, Bld: 90 mg/dL (ref 65–99)
POTASSIUM: 4.1 mmol/L (ref 3.5–5.1)
SODIUM: 131 mmol/L — AB (ref 135–145)
Total Bilirubin: 1.4 mg/dL — ABNORMAL HIGH (ref 0.3–1.2)
Total Protein: 7.8 g/dL (ref 6.5–8.1)

## 2017-01-25 LAB — CBC
HEMATOCRIT: 28.2 % — AB (ref 39.0–52.0)
HEMOGLOBIN: 9.3 g/dL — AB (ref 13.0–17.0)
MCH: 35.6 pg — AB (ref 26.0–34.0)
MCHC: 33 g/dL (ref 30.0–36.0)
MCV: 108 fL — AB (ref 78.0–100.0)
PLATELETS: 335 10*3/uL (ref 150–400)
RBC: 2.61 MIL/uL — AB (ref 4.22–5.81)
RDW: 19.6 % — ABNORMAL HIGH (ref 11.5–15.5)
WBC: 10 10*3/uL (ref 4.0–10.5)

## 2017-01-25 LAB — GLUCOSE, CAPILLARY: Glucose-Capillary: 86 mg/dL (ref 65–99)

## 2017-01-25 MED ORDER — CEFAZOLIN SODIUM-DEXTROSE 2-4 GM/100ML-% IV SOLN
2.0000 g | INTRAVENOUS | Status: DC
  Administered 2017-01-25 – 2017-01-27 (×2): 2 g via INTRAVENOUS
  Filled 2017-01-25 (×3): qty 100

## 2017-01-25 MED ORDER — HEPARIN SODIUM (PORCINE) 1000 UNIT/ML DIALYSIS
20.0000 [IU]/kg | INTRAMUSCULAR | Status: DC | PRN
  Administered 2017-01-25 – 2017-01-27 (×2): 1100 [IU] via INTRAVENOUS_CENTRAL

## 2017-01-25 MED ORDER — MORPHINE SULFATE (PF) 4 MG/ML IV SOLN
4.0000 mg | INTRAVENOUS | Status: DC | PRN
  Administered 2017-01-25 – 2017-01-26 (×5): 4 mg via INTRAVENOUS
  Filled 2017-01-25 (×5): qty 1

## 2017-01-25 MED ORDER — PENTAFLUOROPROP-TETRAFLUOROETH EX AERO: 1.0000 "application " | INHALATION_SPRAY | CUTANEOUS | Status: DC | PRN

## 2017-01-25 MED ORDER — EPOETIN ALFA 4000 UNIT/ML IJ SOLN
4000.0000 [IU] | INTRAMUSCULAR | Status: DC
  Administered 2017-01-25 – 2017-01-27 (×2): 4000 [IU] via INTRAVENOUS
  Filled 2017-01-25 (×2): qty 1

## 2017-01-25 MED ORDER — EPOETIN ALFA 4000 UNIT/ML IJ SOLN
INTRAMUSCULAR | Status: AC
  Administered 2017-01-25: 4000 [IU] via INTRAVENOUS
  Filled 2017-01-25: qty 1

## 2017-01-25 MED ORDER — LIDOCAINE-PRILOCAINE 2.5-2.5 % EX CREA: 1.0000 "application " | TOPICAL_CREAM | CUTANEOUS | Status: DC | PRN

## 2017-01-25 MED ORDER — SODIUM CHLORIDE 0.9 % IV SOLN: 100.0000 mL | INTRAVENOUS | Status: DC | PRN

## 2017-01-25 MED ORDER — HEPARIN SODIUM (PORCINE) 1000 UNIT/ML IJ SOLN
INTRAMUSCULAR | Status: AC
  Administered 2017-01-25: 1100 [IU] via INTRAVENOUS_CENTRAL
  Filled 2017-01-25: qty 7

## 2017-01-25 MED ORDER — LIDOCAINE HCL (PF) 1 % IJ SOLN: 5.0000 mL | INTRAMUSCULAR | Status: DC | PRN

## 2017-01-25 MED ORDER — DIPHENHYDRAMINE HCL 50 MG/ML IJ SOLN
25.0000 mg | Freq: Once | INTRAMUSCULAR | Status: AC
  Administered 2017-01-25: 25 mg via INTRAVENOUS
  Filled 2017-01-25: qty 1

## 2017-01-25 NOTE — Consult Note (Signed)
WOC Nurse wound consult note Reason for Consult: arm wound.  Patient recently had AV graft in the left arm removed and is reported to have an open wound.  When I discussed with bedside nurse she reports HD nurse wanted to leave open to air and patient refused to have dressing removed by HD nurse or by bedside nurse.  Bedside nurse notified patient that I will use telehealth remote cart with camera to assess his wound with HD nurse at the bedside.  Wound type: surgical/site s/p excision of dialysis graft Pressure Injury POA: No Measurement:Bedside nurse to measure when new dressing placed. aprox 12cm x 3cm x 0.3cm  Wound bed: moistly clean 95% with 5% yellow fibrin Drainage (amount, consistency, odor) moderate serosanguineous, no odor per HD nurse who removed the dressing Periwound:intact with edema and medial incision line with staples intact, several sutures noted at the open wound edges.  Dressing procedure/placement/frequency: Calcium alginate for exudate management, foam dressing topper for insulation.  Change dressing every 3 days.   Discussed POC with patient and bedside nurse.  Re consult if needed, will not follow at this time. Thanks  Andi Layfield [employer], RN,CWOCN, CNS [phone])

## 2017-01-25 NOTE — Progress Notes (Signed)
Subjective: Interval History: has complaints of abdominal pain. Patient has some nausea but no vomiting. He denies any diarrhea..  Objective: Vital signs in last 24 hours: Temp:  [97.5 F (36.4 C)-98 F (36.7 C)] 97.5 F (36.4 C) (04/12 0702) Pulse Rate:  [100-110] 105 (04/12 0702) Resp:  [18] 18 (04/11 2208) BP: (102-106)/(68-76) 102/68 (04/12 0702) SpO2:  [98 %-100 %] 100 % (04/12 0702) Weight change: -1.432 kg (-3 lb 2.5 oz)  Intake/Output from previous day: 04/11 0701 - 04/12 0700 In: 480 [P.O.:480] Out: -  Intake/Output this shift: No intake/output data recorded.  Generally patient is alert and in no apparent distress Chest is clear to auscultation Heart exam regular rate and rhythm Extremities no edema  Lab Results:  Recent Labs  01/23/17 2209 01/25/17 0449  WBC 9.4 10.0  HGB 9.6* 9.3*  HCT 29.8* 28.2*  PLT 302 335   BMET:  Recent Labs  01/23/17 2209 01/25/17 0449  NA 132* 131*  K 3.1* 4.1  CL 91* 93*  CO2 30 28  GLUCOSE 107* 90  BUN 13 24*  CREATININE 3.54* 5.73*  CALCIUM 8.3* 8.1*   No results for input(s): PTH in the last 72 hours. Iron Studies: No results for input(s): IRON, TIBC, TRANSFERRIN, FERRITIN in the last 72 hours.  Studies/Results: Ct Abdomen Pelvis W Contrast  Result Date: 01/24/2017 CLINICAL DATA:  Moderate constant generalized abdominal pain onset yesterday. History of HIV and end-stage renal disease. EXAM: CT ABDOMEN AND PELVIS WITH CONTRAST TECHNIQUE: Multidetector CT imaging of the abdomen and pelvis was performed using the standard protocol following bolus administration of intravenous contrast. CONTRAST:  54mL ISOVUE-300 IOPAMIDOL (ISOVUE-300) INJECTION 61%, ISOVUE-300 IOPAMIDOL (ISOVUE-300) INJECTION 61% COMPARISON:  11/29/2015 CT FINDINGS: Lower chest: Cardiomegaly with small pericardial effusion measuring up to 12 mm in thickness along the right lateral and posterior aspect of the heart. Mild bullous changes at the lung  bases. No pneumonic consolidation or effusion. Hepatobiliary: There is heterogeneous enhancement of the liver with changes secondary to passive congestion. No space-occupying mass noted. The gallbladder is nondistended. No cholelithiasis. Pancreas:  The pancreas is unremarkable.  No ductal dilatation mass. Spleen: Inhomogeneous appearance of the pancreas without discrete mass identified. No splenomegaly. Adrenals/Urinary Tract: Normal bilateral adrenal glands. Atrophic kidneys bilaterally. Nonobstructing 3 mm right lower pole renal calculus. No space-occupying mass of the kidneys. No hydroureteronephrosis. Urinary bladder is nondistended. Stomach/Bowel: Contrast distention of the stomach. The proximal small intestine appears mildly edematous with fluid-filled distention of more mid to distal small intestine. Liquid stool within the cecum through ascending colon without mural thickening noted. Appendix is normal. Vascular/Lymphatic: No significant vascular findings are present. No enlarged abdominal or pelvic lymph nodes. Reproductive: Prostate is unremarkable. Other: Moderate volume of ascites in the pelvis. Mild mesenteric edema. Musculoskeletal: No acute or significant osseous findings.Mild anasarca. IMPRESSION: 1. Moderate volume of ascites predominantly in the lower pelvis with mesenteric edema and anasarca. Findings are likely related to third spacing fluid. 2. Passive congestion of the liver and possibly at the spleen accounting for their inhomogeneous appearance. 3. Suspect moderate diffuse small bowel enteritis and diarrheal disease of large bowel. No bowel obstruction. 4. Stable cardiomegaly with small pericardial effusion. Electronically Signed   By: Tollie Eth M.D.   On: 01/24/2017 03:36    I have reviewed the patient's current medications.  Assessment/Plan: Problem #1 abdominal pain: Patient presently admitted because of pancreatitis. Still has some pain. But he doesn't have any nausea or vomiting  today. Problem #2 end-stage  renal disease: His status post hemodialysis on Tuesday. He is due for dialysis today. Problem #3 hypertension: His blood pressure is reasonably controlled Problem #4 anemia: His hemoglobin is below our target goal: Presently he is on Epogen. 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 Patient has been at [facility] and treated with antibiotics. Presently is a febrile. Plan: 1] We'll do dialysis today which is his regular schedule 2] we'll use Epogen 3] we'll check his renal panel and CBC in the morning     LOS: 1 day   Tanazia Achee S 01/25/2017,9:12 AM

## 2017-01-25 NOTE — Progress Notes (Signed)
Pt's O2 sats dropped to 82 on room air, pt had received morphine q4 on day shift, placed pt on 2 L O2 [place] and sats increased to 98. Will continue to monitor.

## 2017-01-25 NOTE — Progress Notes (Signed)
PROGRESS NOTE  Dave Estrada:[id number] DOB: [birth year] DOA: 01/24/2017 PCP: [name] [name], MD  Brief Narrative: 26 year old man PMH HIV/AIDS, ESRD, recently treated at [facility] with excision of infected left upper extremity AV graft and discharged on IV cefazolin who presented with abdominal pain for 2-3 days with vomiting and diarrhea. Denied alcohol use. Lipase was elevated and CT showed ascites and small bowel enteritis but unremarkable pancreas. Admitted for abdominal pain, vomiting, diarrhea, small bowel enteritis.  Assessment/Plan 1. Abdominal pain, vomiting, diarrhea, small bowel enteritis, early pancreatitis not seen on imaging. CT showed moderate volume ascites, liver congestion, diffuse small bowel enteritis. No bowel obstruction.  Make NPO, ice chips allowed.  Rule out C. difficile. Follow-up GI pathogen panel, cryptosporidium, microsporidia.  Pain control, antiemetics.  GI consultation for further recommendations.  Repeat CMP, lipase in a.m.  2. HIV/AIDS. Last viral load 52,000, CD4 378. Appears stable.  Continue antiretrovirals. Continue Bactrim prophylaxis 3 times a week after dialysis.  3. ESRD on HD.   Management per nephrology.  4. Nonischemic cardiomyopathy/Chronic systolic CHF with RVF. Appears well compensated. No lower extremity edema  5. Anemia of chronic disease. Stable.  6. PMH cocaine use  7. Hospitalized 4/4-4/9 at [facility] for infection of AV graft, discharged on cefazolin with the planned stop date 03/14/2017.   Continue IV cefazolin as recommended by infectious disease at 2  DVT prophylaxis: SCDs Code Status: full code Family Communication: none Disposition Plan: home    Brendia Sacks, MD  [facility] Direct contact: [phone] --Via amion app OR  --[email or web address]; password [email or web address]  7PM-7AM contact night coverage as above 01/25/2017, 10:20 AM  LOS: 1 day   Consultants:  Gastroenterology  Procedures:  Hemodialysis,  routine  Antimicrobials:  Cefazolin after hemodialysis, stop date planned 5/3 per infectious disease at [facility]   Bactrim prophylaxis  Interval history/Subjective: Complains of ongoing abdominal pain, bloating. Feels better when lying on stomach. Bowels moved 1-2 days ago. No vomiting. Hungry. Ate oatmeal this morning.   Objective: Vitals:   01/24/17 0815 01/24/17 1300 01/24/17 2208 01/25/17 0702  BP: 102/67 106/76 102/71 102/68  Pulse: (!) 108 100 (!) 110 (!) 105  Resp: 18 18 18    Temp: 97.8 F (36.6 C) 98 F (36.7 C) 97.7 F (36.5 C) 97.5 F (36.4 C)  TempSrc: Oral Oral Oral Oral  SpO2: 100% 98% 100% 100%  Weight: 53 kg (116 lb 13.5 oz)     Height: 5\' 5"  (1.651 m)       Intake/Output Summary (Last 24 hours) at 01/25/17 1020 Last data filed at 01/24/17 1750  Gross per 24 hour  Intake              480 ml  Output                0 ml  Net              480 ml     Filed Weights   01/23/17 2133 01/24/17 0815  Weight: 54.4 kg (120 lb) 53 kg (116 lb 13.5 oz)    Exam:    Constitutional: Appears uncomfortable but not toxic. Lying on stomach. Able to turn over and sit up in bed without difficulty.  Respiratory: Clear to auscultation bilaterally. No wheezes, rales or rhonchi. Normal respiratory effort.  Cardiovascular: Regular rate and rhythm. No murmur, rub or gallop. No lower extremity edema.  Abdomen: Positive bowel sounds. Distended, soft, mild generalized tenderness. Reports mild groin pain but will not permit examination.  Psychiatric: Grossly normal mood and affect. Speech fluent and appropriate.  I have personally reviewed following labs and imaging studies:  Potassium within normal limits. Remainder BMP consistent with end-stage renal disease.  Lipase on admission 549.  Hemoglobin stable 9.3. WBC within normal limits.  Scheduled Meds: .  ceFAZolin (ANCEF) IV  2 g Intravenous Q T,Th,Sa-HD  . clotrimazole   Topical BID  . emtricitabine  200 mg Oral Once per  day on Tue Thu  . epoetin alfa      . epoetin (EPOGEN/PROCRIT) injection  4,000 Units Intravenous Q T,Th,Sa-HD  . etravirine  200 mg Oral Q12H  . heparin      . metoprolol succinate  75 mg Oral Daily  . raltegravir  400 mg Oral BID  . sodium chloride flush  3 mL Intravenous Q12H  . sulfamethoxazole-trimethoprim  0.5 tablet Oral Q M,W,F-HD  . [START ON 01/30/2017] tenofovir  300 mg Oral Once per day on Tue   Continuous Infusions:  Principal Problem:   Pancreatitis Active Problems:   HIV (human immunodeficiency virus infection) (HCC)   ESRD on hemodialysis (HCC)   Nonischemic cardiomyopathy (HCC)   Chronic systolic heart failure (HCC)   AIDS (acquired immune deficiency syndrome) (HCC)   Enteritis   LOS: 1 day

## 2017-01-25 NOTE — Procedures (Signed)
   HEMODIALYSIS TREATMENT NOTE:  4 hour low-heparin dialysis completed via left IJ tunneled catheter. Exit site unremarkable.  Goal met: 2 liters removed without interruption in ultrafiltration.  All blood was returned.    s/p AVG excision in left upper arm.  Pt stated ace bandage/dressing had not been changed since 01/22/17.  He initially reported he had an intact incision with staples;  I recommended leaving this open to air.  He then stated that a "wound specialist" had been "packing the wound".  This was relayed to primary RN who obtained order for wound consult.  WOC nurse was consulted and has formulated POC.    Pt has a medial incision well-approximated with staples.  An open wound extends from anterior LU arm medially to just above antecubital area.  It is measures 15cm in total length and 3cm at its widest margin.  A foam dressing was applied and secured with Kerlex pending the application of prescribed dressing.  Report given to Kayla, RN.   , RN, CDN  

## 2017-01-25 NOTE — Care Management Note (Signed)
Case Management Note  Patient Details  Name: Dave Estrada MRN: [id number] Date of Birth: 03-05-1991  Subjective/Objective:   Pt admitted from home with pancreatitis/enteritis . Pt is independent with ADL's. Pt receives dialysis at [facility] in [place] T/TH/S.  No CM needs anticipated.                Action/Plan: Anticipate DC home with self care.   Expected Discharge Date:       01/27/2017           Expected Discharge Plan:  Home/Self Care  In-House Referral:     Discharge planning Services  CM Consult  Post Acute Care Choice:    Choice offered to:     DME Arranged:    DME Agency:     HH Arranged:    HH Agency:     Status of Service:  In process, will continue to follow  If discussed at Long Length of Stay Meetings, dates discussed:    Additional Comments:  Denecia Brunette, Chrystine Oiler, RN 01/25/2017, 11:51 AM

## 2017-01-25 NOTE — Progress Notes (Signed)
Pharmacy Antibiotic Note  Dave Estrada is a 26 y.o. male admitted on 01/24/2017 with wound infection.  Pharmacy has been consulted for Cefazolin dosing. HD on Tues, Thursday and Saturday Plan: Cefazolin 2g after every HD F/U cxs and clinical progress  Height: 5\' 5"  (165.1 cm) Weight: 116 lb 13.5 oz (53 kg) IBW/kg (Calculated) : 61.5  Temp (24hrs), Avg:97.7 F (36.5 C), Min:97.5 F (36.4 C), Max:98 F (36.7 C)   Recent Labs Lab 01/23/17 2209 01/25/17 0449  WBC 9.4 10.0  CREATININE 3.54* 5.73*    Estimated Creatinine Clearance: 14.5 mL/min (A) (by C-G formula based on SCr of 5.73 mg/dL (H)).    Allergies  Allergen Reactions  . Bee Venom Anaphylaxis  . Other Anaphylaxis    mushrooms  . Aspirin Other (See Comments)    Reaction is unknown    Antimicrobials this admission: Cefazolin 4/12 >>  Septra PTA >>  For PCP px  Dose adjustments this admission: N/A  Microbiology results: 4/11 MRSA PCR: negative  Thank you for allowing pharmacy to be a part of this patient's care.  Elder Cyphers, BS Pharm D, BCPS Clinical Pharmacist Pager [phone] 01/25/2017 10:20 AM

## 2017-01-26 LAB — RENAL FUNCTION PANEL
ALBUMIN: 2.6 g/dL — AB (ref 3.5–5.0)
Anion gap: 10 (ref 5–15)
BUN: 13 mg/dL (ref 6–20)
CO2: 29 mmol/L (ref 22–32)
CREATININE: 4.03 mg/dL — AB (ref 0.61–1.24)
Calcium: 8.5 mg/dL — ABNORMAL LOW (ref 8.9–10.3)
Chloride: 93 mmol/L — ABNORMAL LOW (ref 101–111)
GFR calc Af Amer: 22 mL/min — ABNORMAL LOW (ref >60)
GFR, EST NON AFRICAN AMERICAN: 19 mL/min — AB (ref >60)
GLUCOSE: 75 mg/dL (ref 65–99)
POTASSIUM: 4.2 mmol/L (ref 3.5–5.1)
Phosphorus: 4 mg/dL (ref 2.5–4.6)
Sodium: 132 mmol/L — ABNORMAL LOW (ref 135–145)

## 2017-01-26 LAB — HEPATIC FUNCTION PANEL
ALK PHOS: 128 U/L — AB (ref 38–126)
ALT: 5 U/L — AB (ref 17–63)
AST: 32 U/L (ref 15–41)
Albumin: 2.6 g/dL — ABNORMAL LOW (ref 3.5–5.0)
BILIRUBIN DIRECT: 0.7 mg/dL — AB (ref 0.1–0.5)
BILIRUBIN INDIRECT: 0.9 mg/dL (ref 0.3–0.9)
TOTAL PROTEIN: 8.5 g/dL — AB (ref 6.5–8.1)
Total Bilirubin: 1.6 mg/dL — ABNORMAL HIGH (ref 0.3–1.2)

## 2017-01-26 LAB — GLUCOSE, CAPILLARY: Glucose-Capillary: 84 mg/dL (ref 65–99)

## 2017-01-26 LAB — CBC
HCT: 30.3 % — ABNORMAL LOW (ref 39.0–52.0)
Hemoglobin: 9.6 g/dL — ABNORMAL LOW (ref 13.0–17.0)
MCH: 35.2 pg — ABNORMAL HIGH (ref 26.0–34.0)
MCHC: 31.7 g/dL (ref 30.0–36.0)
MCV: 111 fL — AB (ref 78.0–100.0)
PLATELETS: 374 10*3/uL (ref 150–400)
RBC: 2.73 MIL/uL — ABNORMAL LOW (ref 4.22–5.81)
RDW: 20.5 % — AB (ref 11.5–15.5)
WBC: 8.2 10*3/uL (ref 4.0–10.5)

## 2017-01-26 LAB — LIPASE, BLOOD: LIPASE: 73 U/L — AB (ref 11–51)

## 2017-01-26 MED ORDER — SULFAMETHOXAZOLE-TRIMETHOPRIM 800-160 MG PO TABS
0.5000 | ORAL_TABLET | ORAL | Status: DC
  Administered 2017-01-27: 0.5 via ORAL
  Filled 2017-01-26: qty 1

## 2017-01-26 MED ORDER — ENSURE ENLIVE PO LIQD: 237.0000 mL | Freq: Two times a day (BID) | ORAL | Status: DC

## 2017-01-26 NOTE — Progress Notes (Signed)
Subjective: Interval History: Patient is feeling much better this morning. He doesn't have any nausea or vomiting. Patient also denies any difficulty in breathing.  Objective: Vital signs in last 24 hours: Temp:  [97.5 F (36.4 C)-98.7 F (37.1 C)] 98.2 F (36.8 C) (04/13 0701) Pulse Rate:  [101-113] 108 (04/13 0701) Resp:  [16-18] 18 (04/13 0701) BP: (83-115)/(58-79) 83/58 (04/13 0701) SpO2:  [82 %-100 %] 100 % (04/13 0701) Weight:  [49.9 kg (110 lb 0.2 oz)-51.3 kg (113 lb 1.5 oz)] 49.9 kg (110 lb 0.2 oz) (04/12 1415) Weight change: -1.7 kg (-3 lb 12 oz)  Intake/Output from previous day: 04/12 0701 - 04/13 0700 In: 100 [IV Piggyback:100] Out: 2000  Intake/Output this shift: No intake/output data recorded.  Generally patient is alert and in no apparent distress Chest is clear to auscultation Heart exam regular rate and rhythm Extremities no edema  Lab Results:  Recent Labs  01/25/17 0449 01/26/17 0644  WBC 10.0 8.2  HGB 9.3* 9.6*  HCT 28.2* 30.3*  PLT 335 374   BMET:   Recent Labs  01/25/17 0449 01/26/17 0644  NA 131* 132*  K 4.1 4.2  CL 93* 93*  CO2 28 29  GLUCOSE 90 75  BUN 24* 13  CREATININE 5.73* 4.03*  CALCIUM 8.1* 8.5*   No results for input(s): PTH in the last 72 hours. Iron Studies: No results for input(s): IRON, TIBC, TRANSFERRIN, FERRITIN in the last 72 hours.  Studies/Results: No results found.  I have reviewed the patient's current medications.  Assessment/Plan: Problem #1 abdominal pain: The abdominal pain is related to his pancreatitis. Presently feeling better. His lipase is also decreasing. Problem #2 end-stage renal disease: His status post hemodialysis yesterday. His potassium is normal. Problem #3 hypertension: His blood pressure is reasonably controlled Problem #4 anemia: His hemoglobin is below our target goal: Presently he is on Epogen. Problem #5 metabolic bone disease: Calcium and phosphorus is in range. 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 Patient has been at [facility] and treated with antibiotics. Presently is a febrile his white blood cell count is normal. Plan: 1] patient doesn't require dialysis today. His next dialysis will be tomorrow. 2] we'll continue with Epogen. 3] we'll check his renal panel and CBC in the morning     LOS: 2 days   Lithzy Bernard S 01/26/2017,8:44 AM

## 2017-01-26 NOTE — Progress Notes (Signed)
No BM since admission despite patient reports of diarrhea at home. Enteric precautions discontinued.

## 2017-01-26 NOTE — Progress Notes (Signed)
Initial Nutrition Assessment  DOCUMENTATION CODES:   Severe malnutrition in context of chronic illness   Underweight  INTERVENTION:  Ensure Enlive po BID, each supplement provides 350 kcal and 20 grams of protein . The patient doesn't like Nepro.  Recommend consider Renal vitamin  Provide small frequent meals and suggest pt choose high calorie foods   NUTRITION DIAGNOSIS:   Malnutrition related to catabolic illness, chronic illness (HIV, ESRD w/HD) as evidenced by estimated needs, severe depletion of body fat, severe depletion of muscle mass.   GOAL:   Patient will meet greater than or equal to 90% of their needs. Optimize nutrition status, prevent nutrient deficiencies and further weight loss.   MONITOR:   Diet advancement, Supplement acceptance, PO intake, Labs, Weight trends, Skin  REASON FOR ASSESSMENT:   Malnutrition Screening Tool    ASSESSMENT:  Dave Estrada is a 26 y.o. male with PMHx of HIV and ESRD, who presents to the Emergency Department complaining of 10/10, moderate, constant, upper and lower abdominal pain onset yesterday. Pt describes his pain as " if someone is pressing on my stomach". Pt reports associated nausea, induced vomiting and  Diarrhea x 3-4 episodes. Pt notes his pain is briefly relieved with vomiting. He notes his abdominal pain is exacerbated with hiccupinig, belching and eating. He reports the pain gets worse immediately after eating.  Pt reports having ~3-4 episodes of self-induced vomiting throughout the day yesterday. He also notes having chills. No alleviating factors noted. Pt reports having his dialysis sessions on Tuesdays earlier today, Thursday's and Saturday's.  Patient diet advanced on Wednesday but poor tolerance and NPO was resumed. His pain is better now he says and diet is advanced to full liquids. He has not been eating well prior to admission. He has been on dialysis since 2014. He consumes a regular diet at home but does not  eat frequently through the day.   His weight has decreased severely 10% since November. His nutrition focused exam shows severe temporal, clavicle/acormion wasting and thoracic, upper arm fat depletions- no edema today.     Recent Labs Lab 01/23/17 2209 01/25/17 0449 01/26/17 0644  NA 132* 131* 132*  K 3.1* 4.1 4.2  CL 91* 93* 93*  CO2 30 28 29   BUN 13 24* 13  CREATININE 3.54* 5.73* 4.03*  CALCIUM 8.3* 8.1* 8.5*  PHOS  --   --  4.0  GLUCOSE 107* 90 75   Labs: Cr. 4.03 , Sodium 132  Meds: Viread, [place]   Diet Order:  Diet full liquid Room service appropriate? Yes; Fluid consistency: Thin  Skin:   A V-fistula and pt has a skin condition bilateral lower legs  Last BM:  4/10 diarrhea   Height:   Ht Readings from Last 1 Encounters:  01/24/17 5\' 5"  (1.651 m)    Weight:   Wt Readings from Last 1 Encounters:  01/25/17 110 lb 0.2 oz (49.9 kg)    Ideal Body Weight:  62 kg  BMI:  Body mass index is 18.31 kg/m.  Estimated Nutritional Needs:   Kcal:  1750-1900 (35-38 kcal/kg)  Protein:  70-80 gr (1.4-1.6 gr/kg)  Fluid:  1.2 liters daily   EDUCATION NEEDS:   Education needs addressed- avoid raw eggs, unpasteurized cheeses or uncooked seafood  Royann Shivers MS,RD,CSG,LDN Office: [phone] Pager: [phone]

## 2017-01-26 NOTE — Progress Notes (Addendum)
Dr. Karilyn Cota has been notified of patient's consult.

## 2017-01-26 NOTE — Progress Notes (Signed)
PROGRESS NOTE  ADE STMARIE ZOX:[id number] DOB: Oct 13, 1991 DOA: 01/24/2017 PCP: [name] [name], MD  Brief Narrative: 26 year old man PMH HIV/AIDS, ESRD, recently treated at [facility] with excision of infected left upper extremity AV graft and discharged on IV cefazolin who presented with abdominal pain for 2-3 days with vomiting and diarrhea. Denied alcohol use. Lipase was elevated and CT showed ascites and small bowel enteritis but unremarkable pancreas. Admitted for abdominal pain, vomiting, diarrhea, small bowel enteritis.  Assessment/Plan 1. Abdominal pain, vomiting, diarrhea, small bowel enteritis, early pancreatitis not seen on imaging. CT showed moderate volume ascites, liver congestion, diffuse small bowel enteritis. No bowel obstruction.  Much improved with minimal pain. Lipase has decreased markedly. No vomiting or diarrhea.   Plan to advance to liquid diet, discontinue IV analgesic. If condition continues to improve, likely advance diet again tomorrow discharge home.  2. HIV/AIDS. Last viral load 52,000, CD4 378. Appears stable.  Appears stable. Continue antiretrovirals. Continue Bactrim prophylaxis 3 times a week after dialysis.  3. ESRD on HD.   Management per nephrology.  Hemodialysis 4/14.  4. Nonischemic cardiomyopathy/Chronic systolic CHF with RVF. Appears stable. 5. Anemia of chronic disease. Remains stable. 6. PMH cocaine use 7. Hospitalized 4/4-4/9 at [facility] for infection of AV graft, discharged on cefazolin with the planned stop date 03/14/2017.   Continue IV cefazolin as recommended by infectious disease at [facility]  DVT prophylaxis: SCDs Code Status: full code Family Communication: none Disposition Plan: home    Brendia Sacks, MD  [facility] Direct contact: [phone] --Via amion app OR  --[email or web address]; password [email or web address]  7PM-7AM contact night coverage as above 01/26/2017, 3:47 PM  LOS: 2 days    Consultants:  Gastroenterology  Procedures:  Hemodialysis, routine  Antimicrobials:  Cefazolin after hemodialysis, stop date planned 5/3 per infectious disease at [facility]   Bactrim prophylaxis  Interval history/Subjective: Feeling a lot better today. Pain has decreased. Wants to advance diet. No vomiting or diarrhea.  Objective: Vitals:   01/25/17 2130 01/26/17 0300 01/26/17 0701 01/26/17 1300  BP: 98/70  (!) 83/58 (!) 88/58  Pulse:   (!) 108 (!) 107  Resp:   18 20  Temp:   98.2 F (36.8 C) 98.1 F (36.7 C)  TempSrc:   Oral Oral  SpO2: 100% 100% 100% 100%  Weight:      Height:       No intake or output data in the 24 hours ending 01/26/17 1547   Filed Weights   01/24/17 0815 01/25/17 0955 01/25/17 1415  Weight: 53 kg (116 lb 13.5 oz) 51.3 kg (113 lb 1.5 oz) 49.9 kg (110 lb 0.2 oz)    Exam: Constitutional: Appears calm, comfortable. Sitting on bench. Cardiovascular: Regular rate and rhythm. No murmur, rub or gallop. Respiratory: Clear to auscultation bilaterally. No wheezes, rales or rhonchi. Normal respiratory effort. Abdomen: Soft, nontender, nondistended. Positive bowel sounds. Psychiatric: Grossly normal mood and affect. Speech fluent and appropriate.  I have personally reviewed the following:   Labs:  Potassium within normal limits. Remainder BMP consistent with end-stage renal disease. LFTs without significant change, total bilirubin 1.6. Lipase markedly decreased, 73. Hypoalbuminemia noted.  Hemoglobin stable 9.6. No leukocytosis. VBAC 0.2.  Imaging studies:    Medical tests:    Scheduled Meds: .  ceFAZolin (ANCEF) IV  2 g Intravenous Q T,Th,Sa-HD  . clotrimazole   Topical BID  . emtricitabine  200 mg Oral Once per day on Tue Thu  . epoetin (EPOGEN/PROCRIT) injection  4,000 Units Intravenous Q T,Th,Sa-HD  .  etravirine  200 mg Oral Q12H  . metoprolol succinate  75 mg Oral Daily  . raltegravir  400 mg Oral BID  . sodium chloride flush  3 mL  Intravenous Q12H  . [START ON 01/27/2017] sulfamethoxazole-trimethoprim  0.5 tablet Oral Q T,Th,Sa-HD  . [START ON 01/30/2017] tenofovir  300 mg Oral Once per day on Tue   Continuous Infusions:  Principal Problem:   Pancreatitis Active Problems:   HIV (human immunodeficiency virus infection) (HCC)   ESRD on hemodialysis (HCC)   Nonischemic cardiomyopathy (HCC)   Chronic systolic heart failure (HCC)   AIDS (acquired immune deficiency syndrome) (HCC)   Enteritis   LOS: 2 days

## 2017-01-27 LAB — GASTROINTESTINAL PANEL BY PCR, STOOL (REPLACES STOOL CULTURE)

## 2017-01-27 LAB — GLUCOSE, CAPILLARY: GLUCOSE-CAPILLARY: 76 mg/dL (ref 65–99)

## 2017-01-27 LAB — LACTOFERRIN, FECAL, QUALITATIVE: LACTOFERRIN, FECAL, QUAL: POSITIVE — AB

## 2017-01-27 MED ORDER — HEPARIN SODIUM (PORCINE) 1000 UNIT/ML IJ SOLN
INTRAMUSCULAR | Status: AC
  Administered 2017-01-27: 1100 [IU] via INTRAVENOUS_CENTRAL
  Filled 2017-01-27: qty 8

## 2017-01-27 MED ORDER — SODIUM CHLORIDE 0.9 % IV SOLN: 100.0000 mL | INTRAVENOUS | Status: DC | PRN

## 2017-01-27 MED ORDER — CEFAZOLIN SODIUM-DEXTROSE 2-4 GM/100ML-% IV SOLN: 2.0000 g | INTRAVENOUS | Status: DC

## 2017-01-27 MED ORDER — DIPHENHYDRAMINE HCL 50 MG/ML IJ SOLN
25.0000 mg | Freq: Once | INTRAMUSCULAR | Status: AC
  Administered 2017-01-27: 25 mg via INTRAVENOUS

## 2017-01-27 MED ORDER — EPOETIN ALFA 4000 UNIT/ML IJ SOLN
INTRAMUSCULAR | Status: AC
  Administered 2017-01-27: 4000 [IU] via INTRAVENOUS
  Filled 2017-01-27: qty 1

## 2017-01-27 MED ORDER — ALTEPLASE 2 MG IJ SOLR
2.0000 mg | Freq: Once | INTRAMUSCULAR | Status: DC | PRN
  Filled 2017-01-27: qty 2

## 2017-01-27 MED ORDER — DIPHENHYDRAMINE HCL 50 MG/ML IJ SOLN
INTRAMUSCULAR | Status: AC
  Administered 2017-01-27: 25 mg via INTRAVENOUS
  Filled 2017-01-27: qty 1

## 2017-01-27 NOTE — Procedures (Signed)
   HEMODIALYSIS TREATMENT NOTE:  4 hour low-heparin dialysis completed via left IJ tunneled PC. Exit site unremarkable. Goal met: 2 liters removed without interruption in ultrafiltration.  All blood was returned. Hemodynamically stable throughout HD session.  Report given to Threasa Alpha, RN.  Rockwell Alexandria, RN, CDN

## 2017-01-27 NOTE — Progress Notes (Signed)
Patient discharged home with IV removed and site intact. Patient discharged with personal belongings and verbalizes understanding of discharge papers.

## 2017-01-27 NOTE — Progress Notes (Signed)
Subjective: Interval History: Patient offers no complaints. His abdominal pain has improved and able to tolerate feeding.  Objective: Vital signs in last 24 hours: Temp:  [98.1 F (36.7 C)-98.4 F (36.9 C)] 98.2 F (36.8 C) (04/14 0644) Pulse Rate:  [107-115] 115 (04/14 0644) Resp:  [20] 20 (04/14 0644) BP: (88-113)/(58-69) 113/69 (04/14 0644) SpO2:  [95 %-100 %] 95 % (04/14 0644) Weight:  [50 kg (110 lb 3.7 oz)] 50 kg (110 lb 3.7 oz) (04/14 0644) Weight change: -1.3 kg (-2 lb 13.9 oz)  Intake/Output from previous day: 04/13 0701 - 04/14 0700 In: 3 [I.V.:3] Out: -  Intake/Output this shift: No intake/output data recorded.  Generally patient is alert and in no apparent distress Chest is clear to auscultation Heart exam regular rate and rhythm Extremities no edema  Lab Results:  Recent Labs  01/25/17 0449 01/26/17 0644  WBC 10.0 8.2  HGB 9.3* 9.6*  HCT 28.2* 30.3*  PLT 335 374   BMET:   Recent Labs  01/25/17 0449 01/26/17 0644  NA 131* 132*  K 4.1 4.2  CL 93* 93*  CO2 28 29  GLUCOSE 90 75  BUN 24* 13  CREATININE 5.73* 4.03*  CALCIUM 8.1* 8.5*   No results for input(s): PTH in the last 72 hours. Iron Studies: No results for input(s): IRON, TIBC, TRANSFERRIN, FERRITIN in the last 72 hours.  Studies/Results: No results found.  I have reviewed the patient's current medications.  Assessment/Plan: Problem #1 abdominal pain: The abdominal pain is related to his pancreatitis.Patient has improved significantly. Problem #2 end-stage renal disease: His status post hemodialysis on Thursday. His potassium is normal. Presently patient is started on dialysis. This is his regular treatment. Problem #3 hypertension: His blood pressure is reasonably controlled Problem #4 anemia: His hemoglobin is below our target goal but stable. Presently he is on Epogen. Problem #5 metabolic bone disease: Calcium and phosphorus is in range. 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 Patient remains afebrile with normal white blood cell count. Plan: 1] his next dialysis will be on Tuesday which is his regular schedule 2] patient will be going back to his outpatient dialysis for Tuesday dialysis.     LOS: 3 days   Cortnie Ringel S 01/27/2017,8:44 AM

## 2017-01-27 NOTE — Discharge Summary (Signed)
.   Physician Discharge Summary  Dave Estrada:[id number] DOB: [birth year] DOA: 01/24/2017  PCP: Dave Estrada Dave STOUT, MD  Admit date: 01/24/2017 Discharge date: 01/27/2017  Recommendations for Outpatient Follow-up:  1. Ongoing care of subacute and chronic conditions. 2. Patient educated on low fat diet.  Discharge Diagnoses:  1. Acute pancreatitis 2. Small bowel enteritis 3. HIV/AIDS 4. ESRD  Discharge Condition: improved Disposition: home  Diet recommendation: low fat, low salt diet  Filed Weights   01/25/17 1415 01/27/17 0644 01/27/17 0830  Weight: 49.9 kg (110 lb 0.2 oz) 50 kg (110 lb 3.7 oz) 50.6 kg (111 lb 8.8 oz)    History of present illness:  26 year old man PMH HIV/AIDS, ESRD, recently treated at [facility] with excision of infected left upper extremity AV graft and discharged on IV cefazolin who presented with abdominal pain for 2-3 days with vomiting and diarrhea. Denied alcohol use. Lipase was elevated and CT showed ascites and small bowel enteritis but unremarkable pancreas. Admitted for abdominal pain, vomiting, diarrhea, small bowel enteritis, acute pancreatitis.  Hospital Course:  Made NPO and treated with supportive care with gradual clinical improvement and resolution of abdominal pain. Lipase trended down appropriately. No vomiting or diarrhea since admission, small bowel enteritis resolved spontaneously. Chronic medical problems remained stable. Hospitalization was uncomplicated. See individual issues below:   Abdominal pain, vomiting, diarrhea, small bowel enteritis, early pancreatitis not seen on imaging. CT showed moderate volume ascites, liver congestion, diffuse small bowel enteritis. No bowel obstruction.   HIV/AIDS. Last viral load 52,000, CD4 378.    ESRD on HD.    Nonischemic cardiomyopathy/Chronic systolic CHF with RVF.   Anemia of chronic disease.  Hospitalized 4/4-4/9 at [facility] for infection of AV graft, discharged on cefazolin with the  planned stop date 03/14/2017.   Continue IV cefazolin as recommended by infectious disease at [facility] assessment: S: feels better, no abdominal pain, tolerated solid food for breakfast O: Vitals:   01/27/17 1200 01/27/17 1230  BP: 110/70 104/64  Pulse: (!) 103 (!) 101  Resp:    Temp:     Constitutional: appears calm and comfortable CV: RRR no m/r/g. Resp: CTA bilaterally, no w/r/r. Normal respiratory effort. Abdomen: soft, ntnd.   Discharge Instructions  Discharge Instructions    Activity as tolerated - No restrictions    Complete by:  As directed    Diet - low sodium heart healthy    Complete by:  As directed    Discharge instructions    Complete by:  As directed    Call your physician or seek immediate medical attention for abdominal pain, vomiting, severe nausea, inability to eat, or worsening of condition. Be sure to eat low fat and low salt diet for next 3-4 days. No alcohol. Be sure to follow-up with physicians at [facility] for wound care and further recommendations. [facility] reported arranging home health for wound care. Please contact [facility] 4/16 if HH has not contacted you. Dressing should be changed every three days.     Allergies as of 01/27/2017      Reactions   Bee Venom Anaphylaxis   Other Anaphylaxis   mushrooms   Aspirin Other (See Comments)   Reaction is unknown      Medication List    TAKE these medications   acetaminophen 325 MG tablet Commonly known as:  TYLENOL Take 3 tablets by mouth 3 (three) times daily.   albuterol 108 (90 Base) MCG/ACT inhaler Commonly known as:  PROVENTIL HFA;VENTOLIN HFA Inhale 1-2  puffs into the lungs every 6 (six) hours as needed for wheezing or shortness of breath.   ceFAZolin 2-4 GM/100ML-% IVPB Commonly known as:  ANCEF Inject 100 mLs (2 g total) into the vein Every Tuesday,Thursday,and Saturday with dialysis. Rx per [facility] (originated from [facility] hospitalization). Stop date per [facility] discharge summary 03/14/2017. Lab  monitoring and line care per [facility] protocol. Please contact [facility] for further recommendations. Start taking on:  01/30/2017   clotrimazole 1 % cream Commonly known as:  LOTRIMIN Apply topically 2 (two) times daily.   emtricitabine 200 MG capsule Commonly known as:  EMTRIVA Take 200 mg by mouth 2 (two) times a week. Taken after dialysis on Tuesday and Thursday   etravirine 100 MG tablet Commonly known as:  INTELENCE Take 200 mg by mouth every 12 (twelve) hours.   HYDROmorphone 2 MG tablet Commonly known as:  DILAUDID Take 1-2 tablets by mouth every 4 (four) hours as needed for pain.   metoprolol succinate 50 MG 24 hr tablet Commonly known as:  TOPROL-XL Take 50 mg by mouth daily. Take 1 1/2 tablets daily   pantoprazole 40 MG tablet Commonly known as:  PROTONIX Take 1 tablet by mouth daily.   raltegravir 400 MG tablet Commonly known as:  ISENTRESS Take 400 mg by mouth 2 (two) times daily.   sulfamethoxazole-trimethoprim 400-80 MG tablet Commonly known as:  BACTRIM,SEPTRA Take 1 tablet by mouth 3 (three) times a week. To take after dialysis days   tenofovir 300 MG tablet Commonly known as:  VIREAD Take 300 mg by mouth once a week. To be taken on Tuesday after dialysis      Allergies  Allergen Reactions  . Bee Venom Anaphylaxis  . Other Anaphylaxis    mushrooms  . Aspirin Other (See Comments)    Reaction is unknown    The results of significant diagnostics from this hospitalization (including imaging, microbiology, ancillary and laboratory) are listed below for reference.    Significant Diagnostic Studies: Ct Abdomen Pelvis W Contrast  Result Date: 01/24/2017 CLINICAL DATA:  Moderate constant generalized abdominal pain onset yesterday. History of HIV and end-stage renal disease. EXAM: CT ABDOMEN AND PELVIS WITH CONTRAST TECHNIQUE: Multidetector CT imaging of the abdomen and pelvis was performed using the standard protocol following bolus administration of intravenous  contrast. CONTRAST:  30mL ISOVUE-300 IOPAMIDOL (ISOVUE-300) INJECTION 61%, ISOVUE-300 IOPAMIDOL (ISOVUE-300) INJECTION 61% COMPARISON:  11/29/2015 CT FINDINGS: Lower chest: Cardiomegaly with small pericardial effusion measuring up to 12 mm in thickness along the right lateral and posterior aspect of the heart. Mild bullous changes at the lung bases. No pneumonic consolidation or effusion. Hepatobiliary: There is heterogeneous enhancement of the liver with changes secondary to passive congestion. No space-occupying mass noted. The gallbladder is nondistended. No cholelithiasis. Pancreas:  The pancreas is unremarkable.  No ductal dilatation mass. Spleen: Inhomogeneous appearance of the pancreas without discrete mass identified. No splenomegaly. Adrenals/Urinary Tract: Normal bilateral adrenal glands. Atrophic kidneys bilaterally. Nonobstructing 3 mm right lower pole renal calculus. No space-occupying mass of the kidneys. No hydroureteronephrosis. Urinary bladder is nondistended. Stomach/Bowel: Contrast distention of the stomach. The proximal small intestine appears mildly edematous with fluid-filled distention of more mid to distal small intestine. Liquid stool within the cecum through ascending colon without mural thickening noted. Appendix is normal. Vascular/Lymphatic: No significant vascular findings are present. No enlarged abdominal or pelvic lymph nodes. Reproductive: Prostate is unremarkable. Other: Moderate volume of ascites in the pelvis. Mild mesenteric edema. Musculoskeletal: No acute or significant  osseous findings.Mild anasarca. IMPRESSION: 1. Moderate volume of ascites predominantly in the lower pelvis with mesenteric edema and anasarca. Findings are likely related to third spacing fluid. 2. Passive congestion of the liver and possibly at the spleen accounting for their inhomogeneous appearance. 3. Suspect moderate diffuse small bowel enteritis and diarrheal disease of large bowel. No bowel  obstruction. 4. Stable cardiomegaly with small pericardial effusion. Electronically Signed   By: Tollie Eth M.D.   On: 01/24/2017 03:36    Microbiology: Recent Results (from the past 240 hour(s))  MRSA PCR Screening     Status: None   Collection Time: 01/24/17 11:03 AM  Result Value Ref Range Status   MRSA by PCR NEGATIVE NEGATIVE Final    Comment:        The GeneXpert MRSA Assay (FDA approved for NASAL specimens only), is one component of a comprehensive MRSA colonization surveillance program. It is not intended to diagnose MRSA infection nor to guide or monitor treatment for MRSA infections.      Labs: Basic Metabolic Panel:  Recent Labs Lab 01/23/17 2209 01/25/17 0449 01/26/17 0644  NA 132* 131* 132*  K 3.1* 4.1 4.2  CL 91* 93* 93*  CO2 30 28 29   GLUCOSE 107* 90 75  BUN 13 24* 13  CREATININE 3.54* 5.73* 4.03*  CALCIUM 8.3* 8.1* 8.5*  PHOS  --   --  4.0   Liver Function Tests:  Recent Labs Lab 01/23/17 2209 01/25/17 0449 01/26/17 0644  AST 28 30 32  ALT 6* 6* 5*  ALKPHOS 132* 119 128*  BILITOT 1.9* 1.4* 1.6*  PROT 8.7* 7.8 8.5*  ALBUMIN 2.7* 2.4* 2.6*  2.6*    Recent Labs Lab 01/23/17 2209 01/26/17 0644  LIPASE 549* 73*   CBC:  Recent Labs Lab 01/23/17 2209 01/25/17 0449 01/26/17 0644  WBC 9.4 10.0 8.2  HGB 9.6* 9.3* 9.6*  HCT 29.8* 28.2* 30.3*  MCV 108.4* 108.0* 111.0*  PLT 302 335 374    CBG:  Recent Labs Lab 01/25/17 0742 01/26/17 0756 01/27/17 0802  GLUCAP 86 84 76    Principal Problem:   Pancreatitis Active Problems:   HIV (human immunodeficiency virus infection) (HCC)   ESRD on hemodialysis (HCC)   Nonischemic cardiomyopathy (HCC)   Chronic systolic heart failure (HCC)   AIDS (acquired immune deficiency syndrome) (HCC)   Enteritis   Time coordinating discharge: 20 minutes  Signed:  Brendia Sacks, MD [facility] 01/27/2017, 1:01 PM

## 2017-01-30 DIAGNOSIS — N2581 Secondary hyperparathyroidism of renal origin: Secondary | ICD-10-CM | POA: Diagnosis not present

## 2017-01-30 DIAGNOSIS — D631 Anemia in chronic kidney disease: Secondary | ICD-10-CM | POA: Diagnosis not present

## 2017-01-30 DIAGNOSIS — N186 End stage renal disease: Secondary | ICD-10-CM | POA: Diagnosis not present

## 2017-01-30 DIAGNOSIS — L299 Pruritus, unspecified: Secondary | ICD-10-CM | POA: Diagnosis not present

## 2017-01-30 DIAGNOSIS — R7881 Bacteremia: Secondary | ICD-10-CM | POA: Diagnosis not present

## 2017-02-01 LAB — MICROSPORIDIA SPORE STAIN, FECES

## 2017-02-03 DIAGNOSIS — L299 Pruritus, unspecified: Secondary | ICD-10-CM | POA: Diagnosis not present

## 2017-02-03 DIAGNOSIS — N2581 Secondary hyperparathyroidism of renal origin: Secondary | ICD-10-CM | POA: Diagnosis not present

## 2017-02-03 DIAGNOSIS — D631 Anemia in chronic kidney disease: Secondary | ICD-10-CM | POA: Diagnosis not present

## 2017-02-03 DIAGNOSIS — R7881 Bacteremia: Secondary | ICD-10-CM | POA: Diagnosis not present

## 2017-02-03 DIAGNOSIS — N186 End stage renal disease: Secondary | ICD-10-CM | POA: Diagnosis not present

## 2017-02-06 DIAGNOSIS — N186 End stage renal disease: Secondary | ICD-10-CM | POA: Diagnosis not present

## 2017-02-06 DIAGNOSIS — D631 Anemia in chronic kidney disease: Secondary | ICD-10-CM | POA: Diagnosis not present

## 2017-02-06 DIAGNOSIS — L299 Pruritus, unspecified: Secondary | ICD-10-CM | POA: Diagnosis not present

## 2017-02-06 DIAGNOSIS — N2581 Secondary hyperparathyroidism of renal origin: Secondary | ICD-10-CM | POA: Diagnosis not present

## 2017-02-06 DIAGNOSIS — R7881 Bacteremia: Secondary | ICD-10-CM | POA: Diagnosis not present

## 2017-02-08 DIAGNOSIS — N186 End stage renal disease: Secondary | ICD-10-CM | POA: Diagnosis not present

## 2017-02-08 DIAGNOSIS — L299 Pruritus, unspecified: Secondary | ICD-10-CM | POA: Diagnosis not present

## 2017-02-08 DIAGNOSIS — D631 Anemia in chronic kidney disease: Secondary | ICD-10-CM | POA: Diagnosis not present

## 2017-02-08 DIAGNOSIS — N2581 Secondary hyperparathyroidism of renal origin: Secondary | ICD-10-CM | POA: Diagnosis not present

## 2017-02-08 DIAGNOSIS — R7881 Bacteremia: Secondary | ICD-10-CM | POA: Diagnosis not present

## 2017-02-10 DIAGNOSIS — N186 End stage renal disease: Secondary | ICD-10-CM | POA: Diagnosis not present

## 2017-02-10 DIAGNOSIS — N2581 Secondary hyperparathyroidism of renal origin: Secondary | ICD-10-CM | POA: Diagnosis not present

## 2017-02-10 DIAGNOSIS — D631 Anemia in chronic kidney disease: Secondary | ICD-10-CM | POA: Diagnosis not present

## 2017-02-10 DIAGNOSIS — R7881 Bacteremia: Secondary | ICD-10-CM | POA: Diagnosis not present

## 2017-02-10 DIAGNOSIS — L299 Pruritus, unspecified: Secondary | ICD-10-CM | POA: Diagnosis not present

## 2017-02-12 DIAGNOSIS — N186 End stage renal disease: Secondary | ICD-10-CM | POA: Diagnosis not present

## 2017-02-12 DIAGNOSIS — Z992 Dependence on renal dialysis: Secondary | ICD-10-CM | POA: Diagnosis not present

## 2017-02-13 DIAGNOSIS — D509 Iron deficiency anemia, unspecified: Secondary | ICD-10-CM | POA: Diagnosis not present

## 2017-02-13 DIAGNOSIS — R7881 Bacteremia: Secondary | ICD-10-CM | POA: Diagnosis not present

## 2017-02-13 DIAGNOSIS — L299 Pruritus, unspecified: Secondary | ICD-10-CM | POA: Diagnosis not present

## 2017-02-13 DIAGNOSIS — D696 Thrombocytopenia, unspecified: Secondary | ICD-10-CM | POA: Diagnosis not present

## 2017-02-13 DIAGNOSIS — D631 Anemia in chronic kidney disease: Secondary | ICD-10-CM | POA: Diagnosis not present

## 2017-02-13 DIAGNOSIS — I10 Essential (primary) hypertension: Secondary | ICD-10-CM | POA: Diagnosis not present

## 2017-02-13 DIAGNOSIS — N2581 Secondary hyperparathyroidism of renal origin: Secondary | ICD-10-CM | POA: Diagnosis not present

## 2017-02-13 DIAGNOSIS — N186 End stage renal disease: Secondary | ICD-10-CM | POA: Diagnosis not present

## 2017-02-15 DIAGNOSIS — R7881 Bacteremia: Secondary | ICD-10-CM | POA: Diagnosis not present

## 2017-02-15 DIAGNOSIS — N186 End stage renal disease: Secondary | ICD-10-CM | POA: Diagnosis not present

## 2017-02-15 DIAGNOSIS — N2581 Secondary hyperparathyroidism of renal origin: Secondary | ICD-10-CM | POA: Diagnosis not present

## 2017-02-15 DIAGNOSIS — D631 Anemia in chronic kidney disease: Secondary | ICD-10-CM | POA: Diagnosis not present

## 2017-02-15 DIAGNOSIS — L299 Pruritus, unspecified: Secondary | ICD-10-CM | POA: Diagnosis not present

## 2017-02-20 DIAGNOSIS — D631 Anemia in chronic kidney disease: Secondary | ICD-10-CM | POA: Diagnosis not present

## 2017-02-20 DIAGNOSIS — N186 End stage renal disease: Secondary | ICD-10-CM | POA: Diagnosis not present

## 2017-02-20 DIAGNOSIS — N2581 Secondary hyperparathyroidism of renal origin: Secondary | ICD-10-CM | POA: Diagnosis not present

## 2017-02-20 DIAGNOSIS — L299 Pruritus, unspecified: Secondary | ICD-10-CM | POA: Diagnosis not present

## 2017-02-20 DIAGNOSIS — R7881 Bacteremia: Secondary | ICD-10-CM | POA: Diagnosis not present

## 2017-02-21 DIAGNOSIS — T827XXD Infection and inflammatory reaction due to other cardiac and vascular devices, implants and grafts, subsequent encounter: Secondary | ICD-10-CM | POA: Diagnosis not present

## 2017-02-21 DIAGNOSIS — Z992 Dependence on renal dialysis: Secondary | ICD-10-CM | POA: Diagnosis not present

## 2017-02-21 DIAGNOSIS — N186 End stage renal disease: Secondary | ICD-10-CM | POA: Diagnosis not present

## 2017-02-21 DIAGNOSIS — F17210 Nicotine dependence, cigarettes, uncomplicated: Secondary | ICD-10-CM | POA: Diagnosis not present

## 2017-02-24 DIAGNOSIS — D631 Anemia in chronic kidney disease: Secondary | ICD-10-CM | POA: Diagnosis not present

## 2017-02-24 DIAGNOSIS — L299 Pruritus, unspecified: Secondary | ICD-10-CM | POA: Diagnosis not present

## 2017-02-24 DIAGNOSIS — N2581 Secondary hyperparathyroidism of renal origin: Secondary | ICD-10-CM | POA: Diagnosis not present

## 2017-02-24 DIAGNOSIS — R7881 Bacteremia: Secondary | ICD-10-CM | POA: Diagnosis not present

## 2017-02-24 DIAGNOSIS — N186 End stage renal disease: Secondary | ICD-10-CM | POA: Diagnosis not present

## 2017-02-27 DIAGNOSIS — D631 Anemia in chronic kidney disease: Secondary | ICD-10-CM | POA: Diagnosis not present

## 2017-02-27 DIAGNOSIS — N2581 Secondary hyperparathyroidism of renal origin: Secondary | ICD-10-CM | POA: Diagnosis not present

## 2017-02-27 DIAGNOSIS — R7881 Bacteremia: Secondary | ICD-10-CM | POA: Diagnosis not present

## 2017-02-27 DIAGNOSIS — N186 End stage renal disease: Secondary | ICD-10-CM | POA: Diagnosis not present

## 2017-02-27 DIAGNOSIS — L299 Pruritus, unspecified: Secondary | ICD-10-CM | POA: Diagnosis not present

## 2017-03-03 DIAGNOSIS — L299 Pruritus, unspecified: Secondary | ICD-10-CM | POA: Diagnosis not present

## 2017-03-03 DIAGNOSIS — N2581 Secondary hyperparathyroidism of renal origin: Secondary | ICD-10-CM | POA: Diagnosis not present

## 2017-03-03 DIAGNOSIS — R7881 Bacteremia: Secondary | ICD-10-CM | POA: Diagnosis not present

## 2017-03-03 DIAGNOSIS — N186 End stage renal disease: Secondary | ICD-10-CM | POA: Diagnosis not present

## 2017-03-03 DIAGNOSIS — D631 Anemia in chronic kidney disease: Secondary | ICD-10-CM | POA: Diagnosis not present

## 2017-03-06 DIAGNOSIS — D631 Anemia in chronic kidney disease: Secondary | ICD-10-CM | POA: Diagnosis not present

## 2017-03-06 DIAGNOSIS — N2581 Secondary hyperparathyroidism of renal origin: Secondary | ICD-10-CM | POA: Diagnosis not present

## 2017-03-06 DIAGNOSIS — R7881 Bacteremia: Secondary | ICD-10-CM | POA: Diagnosis not present

## 2017-03-06 DIAGNOSIS — L299 Pruritus, unspecified: Secondary | ICD-10-CM | POA: Diagnosis not present

## 2017-03-06 DIAGNOSIS — N186 End stage renal disease: Secondary | ICD-10-CM | POA: Diagnosis not present

## 2017-03-10 DIAGNOSIS — R7881 Bacteremia: Secondary | ICD-10-CM | POA: Diagnosis not present

## 2017-03-10 DIAGNOSIS — N2581 Secondary hyperparathyroidism of renal origin: Secondary | ICD-10-CM | POA: Diagnosis not present

## 2017-03-10 DIAGNOSIS — D631 Anemia in chronic kidney disease: Secondary | ICD-10-CM | POA: Diagnosis not present

## 2017-03-10 DIAGNOSIS — L299 Pruritus, unspecified: Secondary | ICD-10-CM | POA: Diagnosis not present

## 2017-03-10 DIAGNOSIS — N186 End stage renal disease: Secondary | ICD-10-CM | POA: Diagnosis not present

## 2017-03-13 DIAGNOSIS — N186 End stage renal disease: Secondary | ICD-10-CM | POA: Diagnosis not present

## 2017-03-13 DIAGNOSIS — R7881 Bacteremia: Secondary | ICD-10-CM | POA: Diagnosis not present

## 2017-03-13 DIAGNOSIS — N2581 Secondary hyperparathyroidism of renal origin: Secondary | ICD-10-CM | POA: Diagnosis not present

## 2017-03-13 DIAGNOSIS — D631 Anemia in chronic kidney disease: Secondary | ICD-10-CM | POA: Diagnosis not present

## 2017-03-13 DIAGNOSIS — L299 Pruritus, unspecified: Secondary | ICD-10-CM | POA: Diagnosis not present

## 2017-03-15 DIAGNOSIS — L299 Pruritus, unspecified: Secondary | ICD-10-CM | POA: Diagnosis not present

## 2017-03-15 DIAGNOSIS — R7881 Bacteremia: Secondary | ICD-10-CM | POA: Diagnosis not present

## 2017-03-15 DIAGNOSIS — Z992 Dependence on renal dialysis: Secondary | ICD-10-CM | POA: Diagnosis not present

## 2017-03-15 DIAGNOSIS — N186 End stage renal disease: Secondary | ICD-10-CM | POA: Diagnosis not present

## 2017-03-15 DIAGNOSIS — D631 Anemia in chronic kidney disease: Secondary | ICD-10-CM | POA: Diagnosis not present

## 2017-03-15 DIAGNOSIS — N2581 Secondary hyperparathyroidism of renal origin: Secondary | ICD-10-CM | POA: Diagnosis not present

## 2017-03-17 DIAGNOSIS — D631 Anemia in chronic kidney disease: Secondary | ICD-10-CM | POA: Diagnosis not present

## 2017-03-17 DIAGNOSIS — D509 Iron deficiency anemia, unspecified: Secondary | ICD-10-CM | POA: Diagnosis not present

## 2017-03-17 DIAGNOSIS — D696 Thrombocytopenia, unspecified: Secondary | ICD-10-CM | POA: Diagnosis not present

## 2017-03-17 DIAGNOSIS — N186 End stage renal disease: Secondary | ICD-10-CM | POA: Diagnosis not present

## 2017-03-17 DIAGNOSIS — N2581 Secondary hyperparathyroidism of renal origin: Secondary | ICD-10-CM | POA: Diagnosis not present

## 2017-03-17 DIAGNOSIS — I10 Essential (primary) hypertension: Secondary | ICD-10-CM | POA: Diagnosis not present

## 2017-03-17 DIAGNOSIS — L299 Pruritus, unspecified: Secondary | ICD-10-CM | POA: Diagnosis not present

## 2017-03-20 DIAGNOSIS — L299 Pruritus, unspecified: Secondary | ICD-10-CM | POA: Diagnosis not present

## 2017-03-20 DIAGNOSIS — D696 Thrombocytopenia, unspecified: Secondary | ICD-10-CM | POA: Diagnosis not present

## 2017-03-20 DIAGNOSIS — N186 End stage renal disease: Secondary | ICD-10-CM | POA: Diagnosis not present

## 2017-03-20 DIAGNOSIS — N2581 Secondary hyperparathyroidism of renal origin: Secondary | ICD-10-CM | POA: Diagnosis not present

## 2017-03-20 DIAGNOSIS — D631 Anemia in chronic kidney disease: Secondary | ICD-10-CM | POA: Diagnosis not present

## 2017-03-24 DIAGNOSIS — N2581 Secondary hyperparathyroidism of renal origin: Secondary | ICD-10-CM | POA: Diagnosis not present

## 2017-03-24 DIAGNOSIS — N186 End stage renal disease: Secondary | ICD-10-CM | POA: Diagnosis not present

## 2017-03-24 DIAGNOSIS — D631 Anemia in chronic kidney disease: Secondary | ICD-10-CM | POA: Diagnosis not present

## 2017-03-24 DIAGNOSIS — D696 Thrombocytopenia, unspecified: Secondary | ICD-10-CM | POA: Diagnosis not present

## 2017-03-24 DIAGNOSIS — L299 Pruritus, unspecified: Secondary | ICD-10-CM | POA: Diagnosis not present

## 2017-03-29 DIAGNOSIS — D696 Thrombocytopenia, unspecified: Secondary | ICD-10-CM | POA: Diagnosis not present

## 2017-03-29 DIAGNOSIS — D631 Anemia in chronic kidney disease: Secondary | ICD-10-CM | POA: Diagnosis not present

## 2017-03-29 DIAGNOSIS — N2581 Secondary hyperparathyroidism of renal origin: Secondary | ICD-10-CM | POA: Diagnosis not present

## 2017-03-29 DIAGNOSIS — L299 Pruritus, unspecified: Secondary | ICD-10-CM | POA: Diagnosis not present

## 2017-03-29 DIAGNOSIS — N186 End stage renal disease: Secondary | ICD-10-CM | POA: Diagnosis not present

## 2017-03-31 DIAGNOSIS — L299 Pruritus, unspecified: Secondary | ICD-10-CM | POA: Diagnosis not present

## 2017-03-31 DIAGNOSIS — N2581 Secondary hyperparathyroidism of renal origin: Secondary | ICD-10-CM | POA: Diagnosis not present

## 2017-03-31 DIAGNOSIS — D696 Thrombocytopenia, unspecified: Secondary | ICD-10-CM | POA: Diagnosis not present

## 2017-03-31 DIAGNOSIS — N186 End stage renal disease: Secondary | ICD-10-CM | POA: Diagnosis not present

## 2017-03-31 DIAGNOSIS — D631 Anemia in chronic kidney disease: Secondary | ICD-10-CM | POA: Diagnosis not present

## 2017-04-03 DIAGNOSIS — D696 Thrombocytopenia, unspecified: Secondary | ICD-10-CM | POA: Diagnosis not present

## 2017-04-03 DIAGNOSIS — L299 Pruritus, unspecified: Secondary | ICD-10-CM | POA: Diagnosis not present

## 2017-04-03 DIAGNOSIS — N186 End stage renal disease: Secondary | ICD-10-CM | POA: Diagnosis not present

## 2017-04-03 DIAGNOSIS — D631 Anemia in chronic kidney disease: Secondary | ICD-10-CM | POA: Diagnosis not present

## 2017-04-03 DIAGNOSIS — N2581 Secondary hyperparathyroidism of renal origin: Secondary | ICD-10-CM | POA: Diagnosis not present

## 2017-04-05 DIAGNOSIS — N186 End stage renal disease: Secondary | ICD-10-CM | POA: Diagnosis not present

## 2017-04-05 DIAGNOSIS — L299 Pruritus, unspecified: Secondary | ICD-10-CM | POA: Diagnosis not present

## 2017-04-05 DIAGNOSIS — D631 Anemia in chronic kidney disease: Secondary | ICD-10-CM | POA: Diagnosis not present

## 2017-04-05 DIAGNOSIS — D696 Thrombocytopenia, unspecified: Secondary | ICD-10-CM | POA: Diagnosis not present

## 2017-04-05 DIAGNOSIS — N2581 Secondary hyperparathyroidism of renal origin: Secondary | ICD-10-CM | POA: Diagnosis not present

## 2017-04-07 DIAGNOSIS — D631 Anemia in chronic kidney disease: Secondary | ICD-10-CM | POA: Diagnosis not present

## 2017-04-07 DIAGNOSIS — D696 Thrombocytopenia, unspecified: Secondary | ICD-10-CM | POA: Diagnosis not present

## 2017-04-07 DIAGNOSIS — N186 End stage renal disease: Secondary | ICD-10-CM | POA: Diagnosis not present

## 2017-04-07 DIAGNOSIS — L299 Pruritus, unspecified: Secondary | ICD-10-CM | POA: Diagnosis not present

## 2017-04-07 DIAGNOSIS — N2581 Secondary hyperparathyroidism of renal origin: Secondary | ICD-10-CM | POA: Diagnosis not present

## 2017-04-12 DIAGNOSIS — N2581 Secondary hyperparathyroidism of renal origin: Secondary | ICD-10-CM | POA: Diagnosis not present

## 2017-04-12 DIAGNOSIS — L299 Pruritus, unspecified: Secondary | ICD-10-CM | POA: Diagnosis not present

## 2017-04-12 DIAGNOSIS — N186 End stage renal disease: Secondary | ICD-10-CM | POA: Diagnosis not present

## 2017-04-12 DIAGNOSIS — D631 Anemia in chronic kidney disease: Secondary | ICD-10-CM | POA: Diagnosis not present

## 2017-04-12 DIAGNOSIS — D696 Thrombocytopenia, unspecified: Secondary | ICD-10-CM | POA: Diagnosis not present

## 2017-04-14 DIAGNOSIS — N186 End stage renal disease: Secondary | ICD-10-CM | POA: Diagnosis not present

## 2017-04-14 DIAGNOSIS — Z992 Dependence on renal dialysis: Secondary | ICD-10-CM | POA: Diagnosis not present

## 2017-04-17 DIAGNOSIS — L299 Pruritus, unspecified: Secondary | ICD-10-CM | POA: Diagnosis not present

## 2017-04-17 DIAGNOSIS — N2581 Secondary hyperparathyroidism of renal origin: Secondary | ICD-10-CM | POA: Diagnosis not present

## 2017-04-17 DIAGNOSIS — D509 Iron deficiency anemia, unspecified: Secondary | ICD-10-CM | POA: Diagnosis not present

## 2017-04-17 DIAGNOSIS — I10 Essential (primary) hypertension: Secondary | ICD-10-CM | POA: Diagnosis not present

## 2017-04-17 DIAGNOSIS — D631 Anemia in chronic kidney disease: Secondary | ICD-10-CM | POA: Diagnosis not present

## 2017-04-17 DIAGNOSIS — N186 End stage renal disease: Secondary | ICD-10-CM | POA: Diagnosis not present

## 2017-04-17 DIAGNOSIS — D696 Thrombocytopenia, unspecified: Secondary | ICD-10-CM | POA: Diagnosis not present

## 2017-04-21 DIAGNOSIS — D509 Iron deficiency anemia, unspecified: Secondary | ICD-10-CM | POA: Diagnosis not present

## 2017-04-21 DIAGNOSIS — L299 Pruritus, unspecified: Secondary | ICD-10-CM | POA: Diagnosis not present

## 2017-04-21 DIAGNOSIS — D631 Anemia in chronic kidney disease: Secondary | ICD-10-CM | POA: Diagnosis not present

## 2017-04-21 DIAGNOSIS — N186 End stage renal disease: Secondary | ICD-10-CM | POA: Diagnosis not present

## 2017-04-21 DIAGNOSIS — N2581 Secondary hyperparathyroidism of renal origin: Secondary | ICD-10-CM | POA: Diagnosis not present

## 2017-04-23 DIAGNOSIS — B20 Human immunodeficiency virus [HIV] disease: Secondary | ICD-10-CM | POA: Diagnosis not present

## 2017-04-23 DIAGNOSIS — F17200 Nicotine dependence, unspecified, uncomplicated: Secondary | ICD-10-CM | POA: Diagnosis not present

## 2017-04-23 DIAGNOSIS — Z79899 Other long term (current) drug therapy: Secondary | ICD-10-CM | POA: Diagnosis not present

## 2017-04-26 DIAGNOSIS — N186 End stage renal disease: Secondary | ICD-10-CM | POA: Diagnosis not present

## 2017-04-26 DIAGNOSIS — D631 Anemia in chronic kidney disease: Secondary | ICD-10-CM | POA: Diagnosis not present

## 2017-04-26 DIAGNOSIS — N2581 Secondary hyperparathyroidism of renal origin: Secondary | ICD-10-CM | POA: Diagnosis not present

## 2017-04-26 DIAGNOSIS — L299 Pruritus, unspecified: Secondary | ICD-10-CM | POA: Diagnosis not present

## 2017-04-26 DIAGNOSIS — D509 Iron deficiency anemia, unspecified: Secondary | ICD-10-CM | POA: Diagnosis not present

## 2017-04-27 DIAGNOSIS — I120 Hypertensive chronic kidney disease with stage 5 chronic kidney disease or end stage renal disease: Secondary | ICD-10-CM | POA: Diagnosis not present

## 2017-04-27 DIAGNOSIS — I252 Old myocardial infarction: Secondary | ICD-10-CM | POA: Diagnosis not present

## 2017-04-27 DIAGNOSIS — F17210 Nicotine dependence, cigarettes, uncomplicated: Secondary | ICD-10-CM | POA: Diagnosis not present

## 2017-04-27 DIAGNOSIS — Z79899 Other long term (current) drug therapy: Secondary | ICD-10-CM | POA: Diagnosis not present

## 2017-04-27 DIAGNOSIS — I132 Hypertensive heart and chronic kidney disease with heart failure and with stage 5 chronic kidney disease, or end stage renal disease: Secondary | ICD-10-CM | POA: Diagnosis not present

## 2017-04-27 DIAGNOSIS — N29 Other disorders of kidney and ureter in diseases classified elsewhere: Secondary | ICD-10-CM | POA: Diagnosis not present

## 2017-04-27 DIAGNOSIS — N186 End stage renal disease: Secondary | ICD-10-CM | POA: Diagnosis not present

## 2017-04-27 DIAGNOSIS — Z992 Dependence on renal dialysis: Secondary | ICD-10-CM | POA: Diagnosis not present

## 2017-04-27 DIAGNOSIS — J45909 Unspecified asthma, uncomplicated: Secondary | ICD-10-CM | POA: Diagnosis not present

## 2017-04-27 DIAGNOSIS — B20 Human immunodeficiency virus [HIV] disease: Secondary | ICD-10-CM | POA: Diagnosis not present

## 2017-04-27 DIAGNOSIS — Z8614 Personal history of Methicillin resistant Staphylococcus aureus infection: Secondary | ICD-10-CM | POA: Diagnosis not present

## 2017-04-27 DIAGNOSIS — I5022 Chronic systolic (congestive) heart failure: Secondary | ICD-10-CM | POA: Diagnosis not present

## 2017-05-01 DIAGNOSIS — L299 Pruritus, unspecified: Secondary | ICD-10-CM | POA: Diagnosis not present

## 2017-05-01 DIAGNOSIS — N186 End stage renal disease: Secondary | ICD-10-CM | POA: Diagnosis not present

## 2017-05-01 DIAGNOSIS — D509 Iron deficiency anemia, unspecified: Secondary | ICD-10-CM | POA: Diagnosis not present

## 2017-05-01 DIAGNOSIS — N2581 Secondary hyperparathyroidism of renal origin: Secondary | ICD-10-CM | POA: Diagnosis not present

## 2017-05-01 DIAGNOSIS — D631 Anemia in chronic kidney disease: Secondary | ICD-10-CM | POA: Diagnosis not present

## 2017-05-03 DIAGNOSIS — L299 Pruritus, unspecified: Secondary | ICD-10-CM | POA: Diagnosis not present

## 2017-05-03 DIAGNOSIS — D509 Iron deficiency anemia, unspecified: Secondary | ICD-10-CM | POA: Diagnosis not present

## 2017-05-03 DIAGNOSIS — N2581 Secondary hyperparathyroidism of renal origin: Secondary | ICD-10-CM | POA: Diagnosis not present

## 2017-05-03 DIAGNOSIS — D631 Anemia in chronic kidney disease: Secondary | ICD-10-CM | POA: Diagnosis not present

## 2017-05-03 DIAGNOSIS — N186 End stage renal disease: Secondary | ICD-10-CM | POA: Diagnosis not present

## 2017-05-08 DIAGNOSIS — D631 Anemia in chronic kidney disease: Secondary | ICD-10-CM | POA: Diagnosis not present

## 2017-05-08 DIAGNOSIS — L299 Pruritus, unspecified: Secondary | ICD-10-CM | POA: Diagnosis not present

## 2017-05-08 DIAGNOSIS — N186 End stage renal disease: Secondary | ICD-10-CM | POA: Diagnosis not present

## 2017-05-08 DIAGNOSIS — N2581 Secondary hyperparathyroidism of renal origin: Secondary | ICD-10-CM | POA: Diagnosis not present

## 2017-05-08 DIAGNOSIS — D509 Iron deficiency anemia, unspecified: Secondary | ICD-10-CM | POA: Diagnosis not present

## 2017-05-12 DIAGNOSIS — N186 End stage renal disease: Secondary | ICD-10-CM | POA: Diagnosis not present

## 2017-05-12 DIAGNOSIS — D509 Iron deficiency anemia, unspecified: Secondary | ICD-10-CM | POA: Diagnosis not present

## 2017-05-12 DIAGNOSIS — L299 Pruritus, unspecified: Secondary | ICD-10-CM | POA: Diagnosis not present

## 2017-05-12 DIAGNOSIS — N2581 Secondary hyperparathyroidism of renal origin: Secondary | ICD-10-CM | POA: Diagnosis not present

## 2017-05-12 DIAGNOSIS — D631 Anemia in chronic kidney disease: Secondary | ICD-10-CM | POA: Diagnosis not present

## 2017-05-15 DIAGNOSIS — L299 Pruritus, unspecified: Secondary | ICD-10-CM | POA: Diagnosis not present

## 2017-05-15 DIAGNOSIS — D631 Anemia in chronic kidney disease: Secondary | ICD-10-CM | POA: Diagnosis not present

## 2017-05-15 DIAGNOSIS — D509 Iron deficiency anemia, unspecified: Secondary | ICD-10-CM | POA: Diagnosis not present

## 2017-05-15 DIAGNOSIS — N186 End stage renal disease: Secondary | ICD-10-CM | POA: Diagnosis not present

## 2017-05-15 DIAGNOSIS — Z992 Dependence on renal dialysis: Secondary | ICD-10-CM | POA: Diagnosis not present

## 2017-05-15 DIAGNOSIS — N2581 Secondary hyperparathyroidism of renal origin: Secondary | ICD-10-CM | POA: Diagnosis not present

## 2017-05-17 DIAGNOSIS — N2581 Secondary hyperparathyroidism of renal origin: Secondary | ICD-10-CM | POA: Diagnosis not present

## 2017-05-17 DIAGNOSIS — D631 Anemia in chronic kidney disease: Secondary | ICD-10-CM | POA: Diagnosis not present

## 2017-05-17 DIAGNOSIS — L299 Pruritus, unspecified: Secondary | ICD-10-CM | POA: Diagnosis not present

## 2017-05-17 DIAGNOSIS — N186 End stage renal disease: Secondary | ICD-10-CM | POA: Diagnosis not present

## 2017-05-17 DIAGNOSIS — D509 Iron deficiency anemia, unspecified: Secondary | ICD-10-CM | POA: Diagnosis not present

## 2017-05-17 DIAGNOSIS — I10 Essential (primary) hypertension: Secondary | ICD-10-CM | POA: Diagnosis not present

## 2017-05-19 DIAGNOSIS — D631 Anemia in chronic kidney disease: Secondary | ICD-10-CM | POA: Diagnosis not present

## 2017-05-19 DIAGNOSIS — N2581 Secondary hyperparathyroidism of renal origin: Secondary | ICD-10-CM | POA: Diagnosis not present

## 2017-05-19 DIAGNOSIS — D509 Iron deficiency anemia, unspecified: Secondary | ICD-10-CM | POA: Diagnosis not present

## 2017-05-19 DIAGNOSIS — N186 End stage renal disease: Secondary | ICD-10-CM | POA: Diagnosis not present

## 2017-05-19 DIAGNOSIS — L299 Pruritus, unspecified: Secondary | ICD-10-CM | POA: Diagnosis not present

## 2017-05-22 DIAGNOSIS — N2581 Secondary hyperparathyroidism of renal origin: Secondary | ICD-10-CM | POA: Diagnosis not present

## 2017-05-22 DIAGNOSIS — N186 End stage renal disease: Secondary | ICD-10-CM | POA: Diagnosis not present

## 2017-05-22 DIAGNOSIS — L299 Pruritus, unspecified: Secondary | ICD-10-CM | POA: Diagnosis not present

## 2017-05-22 DIAGNOSIS — D509 Iron deficiency anemia, unspecified: Secondary | ICD-10-CM | POA: Diagnosis not present

## 2017-05-22 DIAGNOSIS — D631 Anemia in chronic kidney disease: Secondary | ICD-10-CM | POA: Diagnosis not present

## 2017-05-24 DIAGNOSIS — D509 Iron deficiency anemia, unspecified: Secondary | ICD-10-CM | POA: Diagnosis not present

## 2017-05-24 DIAGNOSIS — N2581 Secondary hyperparathyroidism of renal origin: Secondary | ICD-10-CM | POA: Diagnosis not present

## 2017-05-24 DIAGNOSIS — D631 Anemia in chronic kidney disease: Secondary | ICD-10-CM | POA: Diagnosis not present

## 2017-05-24 DIAGNOSIS — N186 End stage renal disease: Secondary | ICD-10-CM | POA: Diagnosis not present

## 2017-05-24 DIAGNOSIS — L299 Pruritus, unspecified: Secondary | ICD-10-CM | POA: Diagnosis not present

## 2017-05-29 DIAGNOSIS — N2581 Secondary hyperparathyroidism of renal origin: Secondary | ICD-10-CM | POA: Diagnosis not present

## 2017-05-29 DIAGNOSIS — L299 Pruritus, unspecified: Secondary | ICD-10-CM | POA: Diagnosis not present

## 2017-05-29 DIAGNOSIS — D509 Iron deficiency anemia, unspecified: Secondary | ICD-10-CM | POA: Diagnosis not present

## 2017-05-29 DIAGNOSIS — N186 End stage renal disease: Secondary | ICD-10-CM | POA: Diagnosis not present

## 2017-05-29 DIAGNOSIS — D631 Anemia in chronic kidney disease: Secondary | ICD-10-CM | POA: Diagnosis not present

## 2017-06-02 DIAGNOSIS — N186 End stage renal disease: Secondary | ICD-10-CM | POA: Diagnosis not present

## 2017-06-02 DIAGNOSIS — D509 Iron deficiency anemia, unspecified: Secondary | ICD-10-CM | POA: Diagnosis not present

## 2017-06-02 DIAGNOSIS — N2581 Secondary hyperparathyroidism of renal origin: Secondary | ICD-10-CM | POA: Diagnosis not present

## 2017-06-02 DIAGNOSIS — D631 Anemia in chronic kidney disease: Secondary | ICD-10-CM | POA: Diagnosis not present

## 2017-06-02 DIAGNOSIS — L299 Pruritus, unspecified: Secondary | ICD-10-CM | POA: Diagnosis not present

## 2017-06-04 DIAGNOSIS — M79602 Pain in left arm: Secondary | ICD-10-CM | POA: Diagnosis not present

## 2017-06-04 DIAGNOSIS — Z992 Dependence on renal dialysis: Secondary | ICD-10-CM | POA: Diagnosis not present

## 2017-06-04 DIAGNOSIS — N186 End stage renal disease: Secondary | ICD-10-CM | POA: Diagnosis not present

## 2017-06-05 DIAGNOSIS — D509 Iron deficiency anemia, unspecified: Secondary | ICD-10-CM | POA: Diagnosis not present

## 2017-06-05 DIAGNOSIS — I509 Heart failure, unspecified: Secondary | ICD-10-CM | POA: Diagnosis not present

## 2017-06-05 DIAGNOSIS — M25522 Pain in left elbow: Secondary | ICD-10-CM | POA: Diagnosis not present

## 2017-06-05 DIAGNOSIS — N186 End stage renal disease: Secondary | ICD-10-CM | POA: Diagnosis not present

## 2017-06-05 DIAGNOSIS — L299 Pruritus, unspecified: Secondary | ICD-10-CM | POA: Diagnosis not present

## 2017-06-05 DIAGNOSIS — Z992 Dependence on renal dialysis: Secondary | ICD-10-CM | POA: Diagnosis not present

## 2017-06-05 DIAGNOSIS — L0201 Cutaneous abscess of face: Secondary | ICD-10-CM | POA: Diagnosis not present

## 2017-06-05 DIAGNOSIS — D631 Anemia in chronic kidney disease: Secondary | ICD-10-CM | POA: Diagnosis not present

## 2017-06-05 DIAGNOSIS — I120 Hypertensive chronic kidney disease with stage 5 chronic kidney disease or end stage renal disease: Secondary | ICD-10-CM | POA: Diagnosis not present

## 2017-06-05 DIAGNOSIS — Z886 Allergy status to analgesic agent status: Secondary | ICD-10-CM | POA: Diagnosis not present

## 2017-06-05 DIAGNOSIS — Z91030 Bee allergy status: Secondary | ICD-10-CM | POA: Diagnosis not present

## 2017-06-05 DIAGNOSIS — N2581 Secondary hyperparathyroidism of renal origin: Secondary | ICD-10-CM | POA: Diagnosis not present

## 2017-06-05 DIAGNOSIS — H00031 Abscess of right upper eyelid: Secondary | ICD-10-CM | POA: Diagnosis not present

## 2017-06-05 DIAGNOSIS — Z91018 Allergy to other foods: Secondary | ICD-10-CM | POA: Diagnosis not present

## 2017-06-05 DIAGNOSIS — B20 Human immunodeficiency virus [HIV] disease: Secondary | ICD-10-CM | POA: Diagnosis not present

## 2017-06-07 DIAGNOSIS — L299 Pruritus, unspecified: Secondary | ICD-10-CM | POA: Diagnosis not present

## 2017-06-07 DIAGNOSIS — D509 Iron deficiency anemia, unspecified: Secondary | ICD-10-CM | POA: Diagnosis not present

## 2017-06-07 DIAGNOSIS — D631 Anemia in chronic kidney disease: Secondary | ICD-10-CM | POA: Diagnosis not present

## 2017-06-07 DIAGNOSIS — N186 End stage renal disease: Secondary | ICD-10-CM | POA: Diagnosis not present

## 2017-06-07 DIAGNOSIS — N2581 Secondary hyperparathyroidism of renal origin: Secondary | ICD-10-CM | POA: Diagnosis not present

## 2017-06-11 ENCOUNTER — Encounter (HOSPITAL_COMMUNITY): Payer: Self-pay

## 2017-06-11 ENCOUNTER — Emergency Department (HOSPITAL_COMMUNITY)
Admission: EM | Admit: 2017-06-11 | Discharge: 2017-06-11 | Disposition: A | Discharge status: Home / Self Care | Payer: Medicare Other | Attending: Emergency Medicine | Admitting: Emergency Medicine

## 2017-06-11 DIAGNOSIS — L0201 Cutaneous abscess of face: Secondary | ICD-10-CM | POA: Diagnosis not present

## 2017-06-11 DIAGNOSIS — F17210 Nicotine dependence, cigarettes, uncomplicated: Secondary | ICD-10-CM | POA: Insufficient documentation

## 2017-06-11 DIAGNOSIS — J45909 Unspecified asthma, uncomplicated: Secondary | ICD-10-CM | POA: Insufficient documentation

## 2017-06-11 DIAGNOSIS — R519 Headache, unspecified: Secondary | ICD-10-CM

## 2017-06-11 DIAGNOSIS — H00015 Hordeolum externum left lower eyelid: Secondary | ICD-10-CM | POA: Diagnosis not present

## 2017-06-11 DIAGNOSIS — I5022 Chronic systolic (congestive) heart failure: Secondary | ICD-10-CM | POA: Diagnosis not present

## 2017-06-11 DIAGNOSIS — Z992 Dependence on renal dialysis: Secondary | ICD-10-CM | POA: Insufficient documentation

## 2017-06-11 DIAGNOSIS — R51 Headache: Secondary | ICD-10-CM | POA: Insufficient documentation

## 2017-06-11 DIAGNOSIS — Z79899 Other long term (current) drug therapy: Secondary | ICD-10-CM | POA: Insufficient documentation

## 2017-06-11 DIAGNOSIS — N186 End stage renal disease: Secondary | ICD-10-CM | POA: Diagnosis not present

## 2017-06-11 DIAGNOSIS — B20 Human immunodeficiency virus [HIV] disease: Secondary | ICD-10-CM | POA: Diagnosis not present

## 2017-06-11 MED ORDER — IBUPROFEN 800 MG PO TABS: 800.0000 mg | ORAL_TABLET | Freq: Once | ORAL | Status: DC

## 2017-06-11 MED ORDER — IBUPROFEN 800 MG PO TABS: 800.0000 mg | ORAL_TABLET | Freq: Three times a day (TID) | ORAL | 0 refills | Status: DC

## 2017-06-11 MED ORDER — LIDOCAINE-EPINEPHRINE 1 %-1:100000 IJ SOLN
10.0000 mL | Freq: Once | INTRAMUSCULAR | Status: AC
  Administered 2017-06-11: 10 mL
  Filled 2017-06-11: qty 10

## 2017-06-11 MED ORDER — LIDOCAINE-EPINEPHRINE (PF) 1 %-1:200000 IJ SOLN
INTRAMUSCULAR | Status: AC
  Filled 2017-06-11: qty 30

## 2017-06-11 MED ORDER — POVIDONE-IODINE 10 % EX SOLN
CUTANEOUS | Status: AC
  Filled 2017-06-11: qty 15

## 2017-06-11 MED ORDER — ERYTHROMYCIN 5 MG/GM OP OINT
1.0000 "application " | TOPICAL_OINTMENT | Freq: Once | OPHTHALMIC | Status: AC
  Administered 2017-06-11: 1 via OPHTHALMIC
  Filled 2017-06-11: qty 3.5

## 2017-06-11 MED ORDER — IBUPROFEN 800 MG PO TABS
800.0000 mg | ORAL_TABLET | Freq: Once | ORAL | Status: AC
  Administered 2017-06-11: 800 mg via ORAL
  Filled 2017-06-11: qty 1

## 2017-06-11 MED ORDER — SULFAMETHOXAZOLE-TRIMETHOPRIM 800-160 MG PO TABS: 1.0000 | ORAL_TABLET | Freq: Two times a day (BID) | ORAL | 0 refills | Status: AC

## 2017-06-11 NOTE — ED Notes (Signed)
No answer  when called in lobby 

## 2017-06-11 NOTE — Discharge Instructions (Signed)
Your doctor needs to check you in 3 days You MUST drink plenty of fluids You MUST take Bactrim twice daily for 10 days You MUST apply the topical antibiotic ointment to your eyelid 3 times a day You MUST see your eye doctor in 2 days for a recheck  ER for worsening symtpoms.

## 2017-06-11 NOTE — ED Triage Notes (Signed)
Reports of headache x3-4 days. Patient has wound noted above right eye with brown center. States it is a possible spider bite.

## 2017-06-11 NOTE — ED Provider Notes (Signed)
[facility]-EMERGENCY DEPT Provider Note   CSN: [id number] Arrival date & time: 06/11/17  1610     History   Chief Complaint Chief Complaint  Patient presents with  . Headache  . Insect Bite    HPI Dave Estrada is a 26 y.o. male.  HPI  The patient is a 26 year old male, he has a known history of HIV, his last CD4 count was 100 as of 10 months ago. He reports that he is also a dialysis patient, Tuesday Thursday Saturday, access is in the left forearm. He denies missing any dialysis, states that he has had progressive headache due to swelling of his right forehead as well as a stye in his left eye which has become purulent and swollen on the lower lid. He denies fevers or chills but has had a progressive generalized headache. No vomiting, no chest pain or shortness of breath. He has tried warm compresses as well as squeezing on this lesion on the forehead but it does not seem to drain even though it is getting bigger.  Past Medical History:  Diagnosis Date  . A-V fistula (HCC)    Placed on 10/27/2014 at [facility]; for future HD use.   . Asthma   . ESRD (end stage renal disease) (HCC)   . Heart attack (HCC)   . HIV (human immunodeficiency virus infection) (HCC)   . Staphylococcus aureus bacteremia with sepsis (HCC) 09/09/2014  . Systolic dysfunction, left ventricle 12/20/2014   EF 20%     Patient Active Problem List   Diagnosis Date Noted  . Pancreatitis 01/24/2017  . Enteritis 01/24/2017  . Inguinal adenopathy 07/18/2016  . AIDS (acquired immune deficiency syndrome) (HCC)   . Hyperbilirubinemia   . Chronic systolic heart failure (HCC) 01/13/2016  . Nonischemic cardiomyopathy (HCC) 11/27/2015  . Hemoptysis 09/11/2015  . Cough 09/11/2015  . Elevated troponin 09/11/2015  . Protein-calorie malnutrition, severe (HCC) 09/11/2015  . Pneumonia 09/11/2015  . A-V fistula (HCC) 12/18/2014  . Thrombocytopenia, acquired (HCC) 12/18/2014  . Other infectious complication of vascular dialysis  catheter 12/17/2014  . 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 Pyrexia   . Vomiting and diarrhea   . Macrocytic anemia 09/10/2014  . Cigarette smoker 09/10/2014  . Liver lesion 09/10/2014  . Asthma, chronic 09/10/2014  . History of MRSA infection 09/10/2014  . MRSA bacteremia 09/10/2014  . Staphylococcus aureus bacteremia with sepsis (HCC) 09/09/2014  . HIV (human immunodeficiency virus infection) (HCC) 09/08/2014  . ESRD on hemodialysis (HCC) 09/08/2014    Past Surgical History:  Procedure Laterality Date  . INSERTION OF DIALYSIS CATHETER Left 09/15/2014   Procedure: INSERTION OF DIALYSIS CATHETER;  Surgeon: Nada Libman, MD;  Location: [facility] OR;  Service: Vascular;  Laterality: Left;  . PORT A CATH REVISION    . TEE WITHOUT CARDIOVERSION N/A 09/14/2014   Procedure: TRANSESOPHAGEAL ECHOCARDIOGRAM (TEE);  Surgeon: Laurey Morale, MD;  Location: [facility] ENDOSCOPY;  Service: Cardiovascular;  Laterality: N/A;  will be at [facility] by mon       Home Medications    Prior to Admission medications   Medication Sig Start Date End Date Taking? Authorizing Provider  albuterol (PROVENTIL HFA;VENTOLIN HFA) 108 (90 BASE) MCG/ACT inhaler Inhale 1-2 puffs into the lungs every 6 (six) hours as needed for wheezing or shortness of breath.   Yes [provider]  cephALEXin (KEFLEX) 500 MG capsule Take 500 mg by mouth 2 (two) times daily. 7 day course starting on 06/07/2017 06/07/17  Yes [provider]  clotrimazole (LOTRIMIN) 1 % cream Apply topically 2 (two) times daily. 07/22/16  Yes Philip Aspen, Limmie Patricia, MD  emtricitabine (EMTRIVA) 200 MG capsule Take 200 mg by mouth 2 (two) times a week. Taken after dialysis on Tuesday and Thursday   Yes [provider]  etravirine (INTELENCE) 100 MG tablet Take 200 mg by mouth every 12 (twelve) hours.   Yes [provider]  metoprolol succinate (TOPROL-XL) 50 MG 24 hr tablet Take 50 mg by mouth daily.     Yes [provider]  oxyCODONE (OXY IR/ROXICODONE) 5 MG immediate release tablet Take 5 mg by mouth daily as needed for moderate pain.  04/23/17  Yes [provider]  raltegravir (ISENTRESS) 400 MG tablet Take 400 mg by mouth 2 (two) times daily.   Yes [provider]  tenofovir (VIREAD) 300 MG tablet Take 300 mg by mouth once a week. To be taken on Tuesday after dialysis   Yes [provider]  sulfamethoxazole-trimethoprim (BACTRIM DS,SEPTRA DS) 800-160 MG tablet Take 1 tablet by mouth 2 (two) times daily. 06/11/17 06/18/17  Eber Hong, MD    Family History Family History  Problem Relation Age of Onset  . Seizures Father     Social History Social History  Substance Use Topics  . Smoking status: Current Every Day Smoker    Packs/day: 0.50    Years: 11.00    Types: Cigarettes    Start date: 01/28/2003  . Smokeless tobacco: Never Used  . Alcohol use No     Allergies   Bee venom; Other; and Aspirin   Review of Systems Review of Systems  All other systems reviewed and are negative.    Physical Exam Updated Vital Signs BP 107/81   Pulse 98   Temp 98.5 F (36.9 C) (Oral)   Resp 19   Ht 5\' 5"  (1.651 m)   Wt 59 kg (130 lb)   SpO2 93%   BMI 21.63 kg/m   Physical Exam  Constitutional: No distress.  HENT:  Head: Atraumatic.  Mouth/Throat: Oropharynx is clear and moist. No oropharyngeal exudate.  Swollen R forehead with central area of scabbing - has some fluctuance  Eyes: Pupils are equal, round, and reactive to light. Conjunctivae and EOM are normal. Right eye exhibits no discharge. Left eye exhibits discharge. No scleral icterus.  L lower lid with mild purulence, mild sty, conjunctivae clear.  Neck: Normal range of motion. Neck supple. No JVD present. No thyromegaly present.  Cardiovascular: Normal rate, regular rhythm, normal heart sounds and intact distal pulses.  Exam reveals no gallop and no friction rub.   No murmur  heard. Pulmonary/Chest: Effort normal. No respiratory distress. He has wheezes ( mild bilateral wheezing). He has no rales.  Abdominal: Soft. Bowel sounds are normal. He exhibits no distension and no mass. There is no tenderness.  Musculoskeletal: Normal range of motion. He exhibits no edema or tenderness.  Lymphadenopathy:    He has no cervical adenopathy.  Neurological: He is alert. Coordination normal.  Skin: Skin is warm and dry. Rash ( R forehead) noted. No erythema.  Psychiatric: He has a normal mood and affect. His behavior is normal.  Nursing note and vitals reviewed.    ED Treatments / Results  Labs (all labs ordered are listed, but only abnormal results are displayed) Labs Reviewed - No data to display   Radiology No results found.  Procedures .Marland KitchenIncision and Drainage Date/Time: 06/11/2017 10:41 PM Performed by: Eber Hong  Authorized by: Eber Hong   Consent:    Consent obtained:  Verbal   Consent given by:  Patient   Risks discussed:  Bleeding, incomplete drainage, infection and pain   Alternatives discussed:  Alternative treatment and no treatment Location:    Type:  Abscess   Size:  5 cm in diameter   Location:  Head   Head location:  Face Pre-procedure details:    Skin preparation:  Betadine Anesthesia (see MAR for exact dosages):    Anesthesia method:  Nerve block   Block location:  Supraorbital   Block needle gauge:  25 G   Block anesthetic:  Lidocaine 1% WITH epi   Block injection procedure:  Anatomic landmarks identified, introduced needle, anatomic landmarks palpated, negative aspiration for blood and incremental injection   Block outcome:  Anesthesia achieved Procedure type:    Complexity:  Complex Procedure details:    Incision types:  Single straight   Incision depth:  Submucosal   Scalpel blade:  11   Wound management:  Probed and deloculated, irrigated with saline and extensive cleaning   Drainage:  Purulent   Drainage amount:   Moderate   Wound treatment:  Wound left open   Packing materials:  1/4 in iodoform gauze Post-procedure details:    Patient tolerance of procedure:  Tolerated well, no immediate complications Comments:     No pain after regional block   (including critical care time)  Medications Ordered in ED Medications  erythromycin ophthalmic ointment 1 application (not administered)  lidocaine-EPINEPHrine (XYLOCAINE-EPINEPHrine) 1 %-1:200000 (PF) injection (not administered)  povidone-iodine (BETADINE) 10 % external solution (not administered)  lidocaine-EPINEPHrine (XYLOCAINE W/EPI) 1 %-1:100000 (with pres) injection 10 mL (10 mLs Other Given by Other 06/11/17 2131)     Initial Impression / Assessment and Plan / ED Course  I have reviewed the triage vital signs and the nursing notes.  Pertinent labs & imaging results that were available during my care of the patient were reviewed by me and considered in my medical decision making (see chart for details).    Has possible abscess to the R forehead Has conjunctivitis and a sty - needs erythromycin and oral antibiotics Optho f/u.  Pt is not systemically ill.  I and D without difficulty Packing placed Bactrim given Erythromycin ointment tube given to pt after administration Stable for d/c Pt states he is taking his antiviral meds. No systemic symptoms Sat's 93% - states he is chronically wheezing  Final Clinical Impressions(s) / ED Diagnoses   Final diagnoses:  Abscess of face  Nonintractable headache, unspecified chronicity pattern, unspecified headache type  Hordeolum externum of left lower eyelid    New Prescriptions New Prescriptions   SULFAMETHOXAZOLE-TRIMETHOPRIM (BACTRIM DS,SEPTRA DS) 800-160 MG TABLET    Take 1 tablet by mouth 2 (two) times daily.     Eber Hong, MD 06/11/17 [phone]

## 2017-06-15 DIAGNOSIS — N186 End stage renal disease: Secondary | ICD-10-CM | POA: Diagnosis not present

## 2017-06-15 DIAGNOSIS — Z992 Dependence on renal dialysis: Secondary | ICD-10-CM | POA: Diagnosis not present

## 2017-06-17 DIAGNOSIS — R531 Weakness: Secondary | ICD-10-CM | POA: Diagnosis not present

## 2017-06-18 DIAGNOSIS — J45901 Unspecified asthma with (acute) exacerbation: Secondary | ICD-10-CM | POA: Diagnosis not present

## 2017-06-18 DIAGNOSIS — Z992 Dependence on renal dialysis: Secondary | ICD-10-CM | POA: Diagnosis not present

## 2017-06-18 DIAGNOSIS — B20 Human immunodeficiency virus [HIV] disease: Secondary | ICD-10-CM | POA: Diagnosis not present

## 2017-06-18 DIAGNOSIS — E875 Hyperkalemia: Secondary | ICD-10-CM | POA: Diagnosis not present

## 2017-06-18 DIAGNOSIS — R531 Weakness: Secondary | ICD-10-CM | POA: Diagnosis not present

## 2017-06-18 DIAGNOSIS — N186 End stage renal disease: Secondary | ICD-10-CM | POA: Diagnosis not present

## 2017-06-18 DIAGNOSIS — I252 Old myocardial infarction: Secondary | ICD-10-CM | POA: Diagnosis not present

## 2017-06-18 DIAGNOSIS — K219 Gastro-esophageal reflux disease without esophagitis: Secondary | ICD-10-CM | POA: Diagnosis present

## 2017-06-18 DIAGNOSIS — F17200 Nicotine dependence, unspecified, uncomplicated: Secondary | ICD-10-CM | POA: Diagnosis present

## 2017-06-18 DIAGNOSIS — Z9115 Patient's noncompliance with renal dialysis: Secondary | ICD-10-CM | POA: Diagnosis not present

## 2017-06-18 DIAGNOSIS — I5020 Unspecified systolic (congestive) heart failure: Secondary | ICD-10-CM | POA: Diagnosis not present

## 2017-06-18 DIAGNOSIS — Z6820 Body mass index (BMI) 20.0-20.9, adult: Secondary | ICD-10-CM | POA: Diagnosis not present

## 2017-06-18 DIAGNOSIS — L0201 Cutaneous abscess of face: Secondary | ICD-10-CM | POA: Diagnosis not present

## 2017-06-18 DIAGNOSIS — R64 Cachexia: Secondary | ICD-10-CM | POA: Diagnosis not present

## 2017-06-18 DIAGNOSIS — G40909 Epilepsy, unspecified, not intractable, without status epilepticus: Secondary | ICD-10-CM | POA: Diagnosis present

## 2017-06-18 DIAGNOSIS — I429 Cardiomyopathy, unspecified: Secondary | ICD-10-CM | POA: Diagnosis not present

## 2017-06-18 DIAGNOSIS — I132 Hypertensive heart and chronic kidney disease with heart failure and with stage 5 chronic kidney disease, or end stage renal disease: Secondary | ICD-10-CM | POA: Diagnosis not present

## 2017-06-26 DIAGNOSIS — D631 Anemia in chronic kidney disease: Secondary | ICD-10-CM | POA: Diagnosis not present

## 2017-06-26 DIAGNOSIS — R7881 Bacteremia: Secondary | ICD-10-CM | POA: Diagnosis not present

## 2017-06-26 DIAGNOSIS — D509 Iron deficiency anemia, unspecified: Secondary | ICD-10-CM | POA: Diagnosis not present

## 2017-06-26 DIAGNOSIS — R5081 Fever presenting with conditions classified elsewhere: Secondary | ICD-10-CM | POA: Diagnosis not present

## 2017-06-26 DIAGNOSIS — N186 End stage renal disease: Secondary | ICD-10-CM | POA: Diagnosis not present

## 2017-06-26 DIAGNOSIS — N2581 Secondary hyperparathyroidism of renal origin: Secondary | ICD-10-CM | POA: Diagnosis not present

## 2017-06-26 DIAGNOSIS — I10 Essential (primary) hypertension: Secondary | ICD-10-CM | POA: Diagnosis not present

## 2017-06-26 DIAGNOSIS — L299 Pruritus, unspecified: Secondary | ICD-10-CM | POA: Diagnosis not present

## 2017-06-26 DIAGNOSIS — D696 Thrombocytopenia, unspecified: Secondary | ICD-10-CM | POA: Diagnosis not present

## 2017-06-28 DIAGNOSIS — N2581 Secondary hyperparathyroidism of renal origin: Secondary | ICD-10-CM | POA: Diagnosis not present

## 2017-06-28 DIAGNOSIS — R7881 Bacteremia: Secondary | ICD-10-CM | POA: Diagnosis not present

## 2017-06-28 DIAGNOSIS — L299 Pruritus, unspecified: Secondary | ICD-10-CM | POA: Diagnosis not present

## 2017-06-28 DIAGNOSIS — D631 Anemia in chronic kidney disease: Secondary | ICD-10-CM | POA: Diagnosis not present

## 2017-06-28 DIAGNOSIS — N186 End stage renal disease: Secondary | ICD-10-CM | POA: Diagnosis not present

## 2017-06-30 DIAGNOSIS — D631 Anemia in chronic kidney disease: Secondary | ICD-10-CM | POA: Diagnosis not present

## 2017-06-30 DIAGNOSIS — L299 Pruritus, unspecified: Secondary | ICD-10-CM | POA: Diagnosis not present

## 2017-06-30 DIAGNOSIS — N186 End stage renal disease: Secondary | ICD-10-CM | POA: Diagnosis not present

## 2017-06-30 DIAGNOSIS — R7881 Bacteremia: Secondary | ICD-10-CM | POA: Diagnosis not present

## 2017-06-30 DIAGNOSIS — N2581 Secondary hyperparathyroidism of renal origin: Secondary | ICD-10-CM | POA: Diagnosis not present

## 2017-07-04 DIAGNOSIS — D631 Anemia in chronic kidney disease: Secondary | ICD-10-CM | POA: Diagnosis not present

## 2017-07-04 DIAGNOSIS — N186 End stage renal disease: Secondary | ICD-10-CM | POA: Diagnosis not present

## 2017-07-04 DIAGNOSIS — L299 Pruritus, unspecified: Secondary | ICD-10-CM | POA: Diagnosis not present

## 2017-07-04 DIAGNOSIS — N2581 Secondary hyperparathyroidism of renal origin: Secondary | ICD-10-CM | POA: Diagnosis not present

## 2017-07-04 DIAGNOSIS — R7881 Bacteremia: Secondary | ICD-10-CM | POA: Diagnosis not present

## 2017-07-07 DIAGNOSIS — R7881 Bacteremia: Secondary | ICD-10-CM | POA: Diagnosis not present

## 2017-07-07 DIAGNOSIS — N186 End stage renal disease: Secondary | ICD-10-CM | POA: Diagnosis not present

## 2017-07-07 DIAGNOSIS — D631 Anemia in chronic kidney disease: Secondary | ICD-10-CM | POA: Diagnosis not present

## 2017-07-07 DIAGNOSIS — N2581 Secondary hyperparathyroidism of renal origin: Secondary | ICD-10-CM | POA: Diagnosis not present

## 2017-07-07 DIAGNOSIS — L299 Pruritus, unspecified: Secondary | ICD-10-CM | POA: Diagnosis not present

## 2017-07-10 DIAGNOSIS — N186 End stage renal disease: Secondary | ICD-10-CM | POA: Diagnosis not present

## 2017-07-10 DIAGNOSIS — D631 Anemia in chronic kidney disease: Secondary | ICD-10-CM | POA: Diagnosis not present

## 2017-07-10 DIAGNOSIS — L299 Pruritus, unspecified: Secondary | ICD-10-CM | POA: Diagnosis not present

## 2017-07-10 DIAGNOSIS — N2581 Secondary hyperparathyroidism of renal origin: Secondary | ICD-10-CM | POA: Diagnosis not present

## 2017-07-10 DIAGNOSIS — R7881 Bacteremia: Secondary | ICD-10-CM | POA: Diagnosis not present

## 2017-07-12 DIAGNOSIS — R7881 Bacteremia: Secondary | ICD-10-CM | POA: Diagnosis not present

## 2017-07-12 DIAGNOSIS — L299 Pruritus, unspecified: Secondary | ICD-10-CM | POA: Diagnosis not present

## 2017-07-12 DIAGNOSIS — N2581 Secondary hyperparathyroidism of renal origin: Secondary | ICD-10-CM | POA: Diagnosis not present

## 2017-07-12 DIAGNOSIS — D631 Anemia in chronic kidney disease: Secondary | ICD-10-CM | POA: Diagnosis not present

## 2017-07-12 DIAGNOSIS — N186 End stage renal disease: Secondary | ICD-10-CM | POA: Diagnosis not present

## 2017-07-13 DIAGNOSIS — N186 End stage renal disease: Secondary | ICD-10-CM | POA: Diagnosis not present

## 2017-07-14 DIAGNOSIS — R7881 Bacteremia: Secondary | ICD-10-CM | POA: Diagnosis not present

## 2017-07-14 DIAGNOSIS — L299 Pruritus, unspecified: Secondary | ICD-10-CM | POA: Diagnosis not present

## 2017-07-14 DIAGNOSIS — N186 End stage renal disease: Secondary | ICD-10-CM | POA: Diagnosis not present

## 2017-07-14 DIAGNOSIS — N2581 Secondary hyperparathyroidism of renal origin: Secondary | ICD-10-CM | POA: Diagnosis not present

## 2017-07-14 DIAGNOSIS — D631 Anemia in chronic kidney disease: Secondary | ICD-10-CM | POA: Diagnosis not present

## 2017-07-15 DIAGNOSIS — Z992 Dependence on renal dialysis: Secondary | ICD-10-CM | POA: Diagnosis not present

## 2017-07-15 DIAGNOSIS — N186 End stage renal disease: Secondary | ICD-10-CM | POA: Diagnosis not present

## 2017-07-19 DIAGNOSIS — Z23 Encounter for immunization: Secondary | ICD-10-CM | POA: Diagnosis not present

## 2017-07-19 DIAGNOSIS — D631 Anemia in chronic kidney disease: Secondary | ICD-10-CM | POA: Diagnosis not present

## 2017-07-19 DIAGNOSIS — N2581 Secondary hyperparathyroidism of renal origin: Secondary | ICD-10-CM | POA: Diagnosis not present

## 2017-07-19 DIAGNOSIS — D509 Iron deficiency anemia, unspecified: Secondary | ICD-10-CM | POA: Diagnosis not present

## 2017-07-19 DIAGNOSIS — N186 End stage renal disease: Secondary | ICD-10-CM | POA: Diagnosis not present

## 2017-07-19 DIAGNOSIS — R5081 Fever presenting with conditions classified elsewhere: Secondary | ICD-10-CM | POA: Diagnosis not present

## 2017-07-19 DIAGNOSIS — R7881 Bacteremia: Secondary | ICD-10-CM | POA: Diagnosis not present

## 2017-07-19 DIAGNOSIS — I10 Essential (primary) hypertension: Secondary | ICD-10-CM | POA: Diagnosis not present

## 2017-07-19 DIAGNOSIS — L299 Pruritus, unspecified: Secondary | ICD-10-CM | POA: Diagnosis not present

## 2017-07-19 DIAGNOSIS — D696 Thrombocytopenia, unspecified: Secondary | ICD-10-CM | POA: Diagnosis not present

## 2017-07-21 DIAGNOSIS — R7881 Bacteremia: Secondary | ICD-10-CM | POA: Diagnosis not present

## 2017-07-21 DIAGNOSIS — D631 Anemia in chronic kidney disease: Secondary | ICD-10-CM | POA: Diagnosis not present

## 2017-07-21 DIAGNOSIS — L299 Pruritus, unspecified: Secondary | ICD-10-CM | POA: Diagnosis not present

## 2017-07-21 DIAGNOSIS — N2581 Secondary hyperparathyroidism of renal origin: Secondary | ICD-10-CM | POA: Diagnosis not present

## 2017-07-21 DIAGNOSIS — N186 End stage renal disease: Secondary | ICD-10-CM | POA: Diagnosis not present

## 2017-07-24 DIAGNOSIS — N2581 Secondary hyperparathyroidism of renal origin: Secondary | ICD-10-CM | POA: Diagnosis not present

## 2017-07-24 DIAGNOSIS — N186 End stage renal disease: Secondary | ICD-10-CM | POA: Diagnosis not present

## 2017-07-24 DIAGNOSIS — L299 Pruritus, unspecified: Secondary | ICD-10-CM | POA: Diagnosis not present

## 2017-07-24 DIAGNOSIS — D631 Anemia in chronic kidney disease: Secondary | ICD-10-CM | POA: Diagnosis not present

## 2017-07-24 DIAGNOSIS — R7881 Bacteremia: Secondary | ICD-10-CM | POA: Diagnosis not present

## 2017-07-26 DIAGNOSIS — R7881 Bacteremia: Secondary | ICD-10-CM | POA: Diagnosis not present

## 2017-07-26 DIAGNOSIS — N2581 Secondary hyperparathyroidism of renal origin: Secondary | ICD-10-CM | POA: Diagnosis not present

## 2017-07-26 DIAGNOSIS — D631 Anemia in chronic kidney disease: Secondary | ICD-10-CM | POA: Diagnosis not present

## 2017-07-26 DIAGNOSIS — L299 Pruritus, unspecified: Secondary | ICD-10-CM | POA: Diagnosis not present

## 2017-07-26 DIAGNOSIS — N186 End stage renal disease: Secondary | ICD-10-CM | POA: Diagnosis not present

## 2017-07-31 DIAGNOSIS — N2581 Secondary hyperparathyroidism of renal origin: Secondary | ICD-10-CM | POA: Diagnosis not present

## 2017-07-31 DIAGNOSIS — R7881 Bacteremia: Secondary | ICD-10-CM | POA: Diagnosis not present

## 2017-07-31 DIAGNOSIS — N186 End stage renal disease: Secondary | ICD-10-CM | POA: Diagnosis not present

## 2017-07-31 DIAGNOSIS — D631 Anemia in chronic kidney disease: Secondary | ICD-10-CM | POA: Diagnosis not present

## 2017-07-31 DIAGNOSIS — L299 Pruritus, unspecified: Secondary | ICD-10-CM | POA: Diagnosis not present

## 2017-08-02 DIAGNOSIS — D631 Anemia in chronic kidney disease: Secondary | ICD-10-CM | POA: Diagnosis not present

## 2017-08-02 DIAGNOSIS — R7881 Bacteremia: Secondary | ICD-10-CM | POA: Diagnosis not present

## 2017-08-02 DIAGNOSIS — N2581 Secondary hyperparathyroidism of renal origin: Secondary | ICD-10-CM | POA: Diagnosis not present

## 2017-08-02 DIAGNOSIS — L299 Pruritus, unspecified: Secondary | ICD-10-CM | POA: Diagnosis not present

## 2017-08-02 DIAGNOSIS — N186 End stage renal disease: Secondary | ICD-10-CM | POA: Diagnosis not present

## 2017-08-09 DIAGNOSIS — L299 Pruritus, unspecified: Secondary | ICD-10-CM | POA: Diagnosis not present

## 2017-08-09 DIAGNOSIS — D631 Anemia in chronic kidney disease: Secondary | ICD-10-CM | POA: Diagnosis not present

## 2017-08-09 DIAGNOSIS — R7881 Bacteremia: Secondary | ICD-10-CM | POA: Diagnosis not present

## 2017-08-09 DIAGNOSIS — N2581 Secondary hyperparathyroidism of renal origin: Secondary | ICD-10-CM | POA: Diagnosis not present

## 2017-08-09 DIAGNOSIS — N186 End stage renal disease: Secondary | ICD-10-CM | POA: Diagnosis not present

## 2017-08-14 DIAGNOSIS — I2510 Atherosclerotic heart disease of native coronary artery without angina pectoris: Secondary | ICD-10-CM | POA: Diagnosis not present

## 2017-08-14 DIAGNOSIS — I5023 Acute on chronic systolic (congestive) heart failure: Secondary | ICD-10-CM | POA: Diagnosis not present

## 2017-08-14 DIAGNOSIS — Z992 Dependence on renal dialysis: Secondary | ICD-10-CM | POA: Diagnosis not present

## 2017-08-14 DIAGNOSIS — L309 Dermatitis, unspecified: Secondary | ICD-10-CM | POA: Diagnosis not present

## 2017-08-14 DIAGNOSIS — E441 Mild protein-calorie malnutrition: Secondary | ICD-10-CM | POA: Diagnosis not present

## 2017-08-14 DIAGNOSIS — E8770 Fluid overload, unspecified: Secondary | ICD-10-CM | POA: Diagnosis not present

## 2017-08-14 DIAGNOSIS — N186 End stage renal disease: Secondary | ICD-10-CM | POA: Diagnosis not present

## 2017-08-14 DIAGNOSIS — B20 Human immunodeficiency virus [HIV] disease: Secondary | ICD-10-CM | POA: Diagnosis not present

## 2017-08-14 DIAGNOSIS — R64 Cachexia: Secondary | ICD-10-CM | POA: Diagnosis not present

## 2017-08-14 DIAGNOSIS — R21 Rash and other nonspecific skin eruption: Secondary | ICD-10-CM | POA: Diagnosis not present

## 2017-08-14 DIAGNOSIS — I429 Cardiomyopathy, unspecified: Secondary | ICD-10-CM | POA: Diagnosis not present

## 2017-08-14 DIAGNOSIS — R6510 Systemic inflammatory response syndrome (SIRS) of non-infectious origin without acute organ dysfunction: Secondary | ICD-10-CM | POA: Diagnosis not present

## 2017-08-14 DIAGNOSIS — Z9115 Patient's noncompliance with renal dialysis: Secondary | ICD-10-CM | POA: Diagnosis not present

## 2017-08-14 DIAGNOSIS — I120 Hypertensive chronic kidney disease with stage 5 chronic kidney disease or end stage renal disease: Secondary | ICD-10-CM | POA: Diagnosis not present

## 2017-08-15 DIAGNOSIS — B20 Human immunodeficiency virus [HIV] disease: Secondary | ICD-10-CM | POA: Diagnosis not present

## 2017-08-15 DIAGNOSIS — Z9119 Patient's noncompliance with other medical treatment and regimen: Secondary | ICD-10-CM | POA: Diagnosis not present

## 2017-08-15 DIAGNOSIS — L309 Dermatitis, unspecified: Secondary | ICD-10-CM | POA: Diagnosis not present

## 2017-08-15 DIAGNOSIS — Z992 Dependence on renal dialysis: Secondary | ICD-10-CM | POA: Diagnosis not present

## 2017-08-15 DIAGNOSIS — I5023 Acute on chronic systolic (congestive) heart failure: Secondary | ICD-10-CM | POA: Diagnosis not present

## 2017-08-15 DIAGNOSIS — N186 End stage renal disease: Secondary | ICD-10-CM | POA: Diagnosis not present

## 2017-08-15 DIAGNOSIS — I2510 Atherosclerotic heart disease of native coronary artery without angina pectoris: Secondary | ICD-10-CM | POA: Diagnosis not present

## 2017-08-15 DIAGNOSIS — Z9115 Patient's noncompliance with renal dialysis: Secondary | ICD-10-CM | POA: Diagnosis not present

## 2017-08-15 DIAGNOSIS — Z9114 Patient's other noncompliance with medication regimen: Secondary | ICD-10-CM | POA: Diagnosis not present

## 2017-08-15 DIAGNOSIS — I509 Heart failure, unspecified: Secondary | ICD-10-CM | POA: Diagnosis not present

## 2017-08-16 DIAGNOSIS — I5023 Acute on chronic systolic (congestive) heart failure: Secondary | ICD-10-CM | POA: Diagnosis not present

## 2017-08-16 DIAGNOSIS — Z9115 Patient's noncompliance with renal dialysis: Secondary | ICD-10-CM | POA: Diagnosis not present

## 2017-08-18 DIAGNOSIS — D509 Iron deficiency anemia, unspecified: Secondary | ICD-10-CM | POA: Diagnosis not present

## 2017-08-18 DIAGNOSIS — N2581 Secondary hyperparathyroidism of renal origin: Secondary | ICD-10-CM | POA: Diagnosis not present

## 2017-08-18 DIAGNOSIS — N186 End stage renal disease: Secondary | ICD-10-CM | POA: Diagnosis not present

## 2017-08-21 DIAGNOSIS — N2581 Secondary hyperparathyroidism of renal origin: Secondary | ICD-10-CM | POA: Diagnosis not present

## 2017-08-21 DIAGNOSIS — N186 End stage renal disease: Secondary | ICD-10-CM | POA: Diagnosis not present

## 2017-08-21 DIAGNOSIS — D509 Iron deficiency anemia, unspecified: Secondary | ICD-10-CM | POA: Diagnosis not present

## 2017-08-23 DIAGNOSIS — N186 End stage renal disease: Secondary | ICD-10-CM | POA: Diagnosis not present

## 2017-08-23 DIAGNOSIS — N2581 Secondary hyperparathyroidism of renal origin: Secondary | ICD-10-CM | POA: Diagnosis not present

## 2017-08-23 DIAGNOSIS — D509 Iron deficiency anemia, unspecified: Secondary | ICD-10-CM | POA: Diagnosis not present

## 2017-08-25 DIAGNOSIS — D509 Iron deficiency anemia, unspecified: Secondary | ICD-10-CM | POA: Diagnosis not present

## 2017-08-25 DIAGNOSIS — N2581 Secondary hyperparathyroidism of renal origin: Secondary | ICD-10-CM | POA: Diagnosis not present

## 2017-08-25 DIAGNOSIS — N186 End stage renal disease: Secondary | ICD-10-CM | POA: Diagnosis not present

## 2017-08-30 DIAGNOSIS — N2581 Secondary hyperparathyroidism of renal origin: Secondary | ICD-10-CM | POA: Diagnosis not present

## 2017-08-30 DIAGNOSIS — D509 Iron deficiency anemia, unspecified: Secondary | ICD-10-CM | POA: Diagnosis not present

## 2017-08-30 DIAGNOSIS — N186 End stage renal disease: Secondary | ICD-10-CM | POA: Diagnosis not present

## 2017-09-03 DIAGNOSIS — D509 Iron deficiency anemia, unspecified: Secondary | ICD-10-CM | POA: Diagnosis not present

## 2017-09-03 DIAGNOSIS — N2581 Secondary hyperparathyroidism of renal origin: Secondary | ICD-10-CM | POA: Diagnosis not present

## 2017-09-03 DIAGNOSIS — N186 End stage renal disease: Secondary | ICD-10-CM | POA: Diagnosis not present

## 2017-09-10 DIAGNOSIS — J020 Streptococcal pharyngitis: Secondary | ICD-10-CM | POA: Diagnosis not present

## 2017-09-10 DIAGNOSIS — R21 Rash and other nonspecific skin eruption: Secondary | ICD-10-CM | POA: Diagnosis not present

## 2017-09-10 DIAGNOSIS — Z886 Allergy status to analgesic agent status: Secondary | ICD-10-CM | POA: Diagnosis not present

## 2017-09-10 DIAGNOSIS — Z91018 Allergy to other foods: Secondary | ICD-10-CM | POA: Diagnosis not present

## 2017-09-10 DIAGNOSIS — Z91030 Bee allergy status: Secondary | ICD-10-CM | POA: Diagnosis not present

## 2017-09-10 DIAGNOSIS — Z720 Tobacco use: Secondary | ICD-10-CM | POA: Diagnosis not present

## 2017-09-11 DIAGNOSIS — N186 End stage renal disease: Secondary | ICD-10-CM | POA: Diagnosis not present

## 2017-09-11 DIAGNOSIS — N2581 Secondary hyperparathyroidism of renal origin: Secondary | ICD-10-CM | POA: Diagnosis not present

## 2017-09-11 DIAGNOSIS — D509 Iron deficiency anemia, unspecified: Secondary | ICD-10-CM | POA: Diagnosis not present

## 2017-09-13 DIAGNOSIS — N2581 Secondary hyperparathyroidism of renal origin: Secondary | ICD-10-CM | POA: Diagnosis not present

## 2017-09-13 DIAGNOSIS — D509 Iron deficiency anemia, unspecified: Secondary | ICD-10-CM | POA: Diagnosis not present

## 2017-09-13 DIAGNOSIS — N186 End stage renal disease: Secondary | ICD-10-CM | POA: Diagnosis not present

## 2017-09-14 DIAGNOSIS — N186 End stage renal disease: Secondary | ICD-10-CM | POA: Diagnosis not present

## 2017-09-14 DIAGNOSIS — Z992 Dependence on renal dialysis: Secondary | ICD-10-CM | POA: Diagnosis not present

## 2017-09-17 DIAGNOSIS — L309 Dermatitis, unspecified: Secondary | ICD-10-CM | POA: Diagnosis not present

## 2017-09-17 DIAGNOSIS — L298 Other pruritus: Secondary | ICD-10-CM | POA: Diagnosis not present

## 2017-09-17 DIAGNOSIS — L089 Local infection of the skin and subcutaneous tissue, unspecified: Secondary | ICD-10-CM | POA: Diagnosis not present

## 2017-09-18 DIAGNOSIS — D509 Iron deficiency anemia, unspecified: Secondary | ICD-10-CM | POA: Diagnosis not present

## 2017-09-18 DIAGNOSIS — N186 End stage renal disease: Secondary | ICD-10-CM | POA: Diagnosis not present

## 2017-09-18 DIAGNOSIS — N2581 Secondary hyperparathyroidism of renal origin: Secondary | ICD-10-CM | POA: Diagnosis not present

## 2017-09-22 DIAGNOSIS — N186 End stage renal disease: Secondary | ICD-10-CM | POA: Diagnosis not present

## 2017-09-22 DIAGNOSIS — D509 Iron deficiency anemia, unspecified: Secondary | ICD-10-CM | POA: Diagnosis not present

## 2017-09-22 DIAGNOSIS — N2581 Secondary hyperparathyroidism of renal origin: Secondary | ICD-10-CM | POA: Diagnosis not present

## 2017-09-25 DIAGNOSIS — D509 Iron deficiency anemia, unspecified: Secondary | ICD-10-CM | POA: Diagnosis not present

## 2017-09-25 DIAGNOSIS — N186 End stage renal disease: Secondary | ICD-10-CM | POA: Diagnosis not present

## 2017-09-25 DIAGNOSIS — N2581 Secondary hyperparathyroidism of renal origin: Secondary | ICD-10-CM | POA: Diagnosis not present

## 2017-10-02 DIAGNOSIS — N2581 Secondary hyperparathyroidism of renal origin: Secondary | ICD-10-CM | POA: Diagnosis not present

## 2017-10-02 DIAGNOSIS — D509 Iron deficiency anemia, unspecified: Secondary | ICD-10-CM | POA: Diagnosis not present

## 2017-10-02 DIAGNOSIS — N186 End stage renal disease: Secondary | ICD-10-CM | POA: Diagnosis not present

## 2017-10-06 DIAGNOSIS — I5022 Chronic systolic (congestive) heart failure: Secondary | ICD-10-CM | POA: Diagnosis not present

## 2017-10-06 DIAGNOSIS — R0789 Other chest pain: Secondary | ICD-10-CM | POA: Diagnosis not present

## 2017-10-06 DIAGNOSIS — F17210 Nicotine dependence, cigarettes, uncomplicated: Secondary | ICD-10-CM | POA: Diagnosis not present

## 2017-10-06 DIAGNOSIS — Z886 Allergy status to analgesic agent status: Secondary | ICD-10-CM | POA: Diagnosis not present

## 2017-10-06 DIAGNOSIS — R0600 Dyspnea, unspecified: Secondary | ICD-10-CM | POA: Diagnosis not present

## 2017-10-06 DIAGNOSIS — N186 End stage renal disease: Secondary | ICD-10-CM | POA: Diagnosis not present

## 2017-10-06 DIAGNOSIS — I132 Hypertensive heart and chronic kidney disease with heart failure and with stage 5 chronic kidney disease, or end stage renal disease: Secondary | ICD-10-CM | POA: Diagnosis not present

## 2017-10-06 DIAGNOSIS — Z91030 Bee allergy status: Secondary | ICD-10-CM | POA: Diagnosis not present

## 2017-10-06 DIAGNOSIS — T782XXA Anaphylactic shock, unspecified, initial encounter: Secondary | ICD-10-CM | POA: Diagnosis not present

## 2017-10-06 DIAGNOSIS — R079 Chest pain, unspecified: Secondary | ICD-10-CM | POA: Diagnosis not present

## 2017-10-06 DIAGNOSIS — Z91018 Allergy to other foods: Secondary | ICD-10-CM | POA: Diagnosis not present

## 2017-10-06 DIAGNOSIS — Z992 Dependence on renal dialysis: Secondary | ICD-10-CM | POA: Diagnosis not present

## 2017-10-06 DIAGNOSIS — R062 Wheezing: Secondary | ICD-10-CM | POA: Diagnosis not present

## 2017-10-06 DIAGNOSIS — B20 Human immunodeficiency virus [HIV] disease: Secondary | ICD-10-CM | POA: Diagnosis not present

## 2017-10-07 DIAGNOSIS — R079 Chest pain, unspecified: Secondary | ICD-10-CM | POA: Diagnosis not present

## 2017-10-11 DIAGNOSIS — N186 End stage renal disease: Secondary | ICD-10-CM | POA: Diagnosis not present

## 2017-10-11 DIAGNOSIS — D509 Iron deficiency anemia, unspecified: Secondary | ICD-10-CM | POA: Diagnosis not present

## 2017-10-11 DIAGNOSIS — N2581 Secondary hyperparathyroidism of renal origin: Secondary | ICD-10-CM | POA: Diagnosis not present

## 2017-10-13 DIAGNOSIS — N186 End stage renal disease: Secondary | ICD-10-CM | POA: Diagnosis not present

## 2017-10-13 DIAGNOSIS — D509 Iron deficiency anemia, unspecified: Secondary | ICD-10-CM | POA: Diagnosis not present

## 2017-10-13 DIAGNOSIS — N2581 Secondary hyperparathyroidism of renal origin: Secondary | ICD-10-CM | POA: Diagnosis not present

## 2017-10-15 DIAGNOSIS — J209 Acute bronchitis, unspecified: Secondary | ICD-10-CM | POA: Diagnosis not present

## 2017-10-15 DIAGNOSIS — I132 Hypertensive heart and chronic kidney disease with heart failure and with stage 5 chronic kidney disease, or end stage renal disease: Secondary | ICD-10-CM | POA: Diagnosis not present

## 2017-10-15 DIAGNOSIS — N186 End stage renal disease: Secondary | ICD-10-CM | POA: Diagnosis not present

## 2017-10-15 DIAGNOSIS — J1189 Influenza due to unidentified influenza virus with other manifestations: Secondary | ICD-10-CM | POA: Diagnosis not present

## 2017-10-15 DIAGNOSIS — J111 Influenza due to unidentified influenza virus with other respiratory manifestations: Secondary | ICD-10-CM | POA: Diagnosis not present

## 2017-10-15 DIAGNOSIS — Z91018 Allergy to other foods: Secondary | ICD-10-CM | POA: Diagnosis not present

## 2017-10-15 DIAGNOSIS — Z21 Asymptomatic human immunodeficiency virus [HIV] infection status: Secondary | ICD-10-CM | POA: Diagnosis not present

## 2017-10-15 DIAGNOSIS — Z992 Dependence on renal dialysis: Secondary | ICD-10-CM | POA: Diagnosis not present

## 2017-10-15 DIAGNOSIS — F17210 Nicotine dependence, cigarettes, uncomplicated: Secondary | ICD-10-CM | POA: Diagnosis not present

## 2017-10-15 DIAGNOSIS — I252 Old myocardial infarction: Secondary | ICD-10-CM | POA: Diagnosis not present

## 2017-10-15 DIAGNOSIS — Z91030 Bee allergy status: Secondary | ICD-10-CM | POA: Diagnosis not present

## 2017-10-15 DIAGNOSIS — Z888 Allergy status to other drugs, medicaments and biological substances status: Secondary | ICD-10-CM | POA: Diagnosis not present

## 2017-10-15 DIAGNOSIS — I509 Heart failure, unspecified: Secondary | ICD-10-CM | POA: Diagnosis not present

## 2017-10-19 ENCOUNTER — Other Ambulatory Visit: Payer: Self-pay

## 2017-10-19 ENCOUNTER — Encounter (HOSPITAL_COMMUNITY): Payer: Self-pay | Admitting: *Deleted

## 2017-10-19 DIAGNOSIS — D631 Anemia in chronic kidney disease: Secondary | ICD-10-CM | POA: Insufficient documentation

## 2017-10-19 DIAGNOSIS — F1920 Other psychoactive substance dependence, uncomplicated: Secondary | ICD-10-CM | POA: Diagnosis not present

## 2017-10-19 DIAGNOSIS — R0602 Shortness of breath: Secondary | ICD-10-CM | POA: Diagnosis present

## 2017-10-19 DIAGNOSIS — R197 Diarrhea, unspecified: Secondary | ICD-10-CM | POA: Diagnosis not present

## 2017-10-19 DIAGNOSIS — Z79899 Other long term (current) drug therapy: Secondary | ICD-10-CM | POA: Insufficient documentation

## 2017-10-19 DIAGNOSIS — N186 End stage renal disease: Secondary | ICD-10-CM | POA: Diagnosis not present

## 2017-10-19 DIAGNOSIS — B20 Human immunodeficiency virus [HIV] disease: Secondary | ICD-10-CM | POA: Diagnosis not present

## 2017-10-19 DIAGNOSIS — R0789 Other chest pain: Secondary | ICD-10-CM | POA: Insufficient documentation

## 2017-10-19 DIAGNOSIS — I509 Heart failure, unspecified: Secondary | ICD-10-CM | POA: Insufficient documentation

## 2017-10-19 DIAGNOSIS — F17210 Nicotine dependence, cigarettes, uncomplicated: Secondary | ICD-10-CM | POA: Diagnosis not present

## 2017-10-19 DIAGNOSIS — R509 Fever, unspecified: Secondary | ICD-10-CM | POA: Diagnosis not present

## 2017-10-19 DIAGNOSIS — R062 Wheezing: Secondary | ICD-10-CM | POA: Diagnosis not present

## 2017-10-19 DIAGNOSIS — D696 Thrombocytopenia, unspecified: Secondary | ICD-10-CM | POA: Insufficient documentation

## 2017-10-19 DIAGNOSIS — R05 Cough: Secondary | ICD-10-CM | POA: Insufficient documentation

## 2017-10-19 DIAGNOSIS — M7918 Myalgia, other site: Secondary | ICD-10-CM | POA: Insufficient documentation

## 2017-10-19 DIAGNOSIS — J45901 Unspecified asthma with (acute) exacerbation: Secondary | ICD-10-CM | POA: Diagnosis not present

## 2017-10-19 DIAGNOSIS — Z992 Dependence on renal dialysis: Secondary | ICD-10-CM | POA: Insufficient documentation

## 2017-10-19 NOTE — ED Triage Notes (Signed)
Pt reports sore throat, fever, and generalized body aches x 2 days.

## 2017-10-20 ENCOUNTER — Observation Stay (HOSPITAL_COMMUNITY)
Admission: EM | Admit: 2017-10-20 | Discharge: 2017-10-21 | Disposition: A | Discharge status: Home / Self Care | Payer: Medicare Other | Attending: Internal Medicine | Admitting: Internal Medicine

## 2017-10-20 ENCOUNTER — Emergency Department (HOSPITAL_COMMUNITY): Payer: Medicare Other

## 2017-10-20 DIAGNOSIS — M79604 Pain in right leg: Secondary | ICD-10-CM

## 2017-10-20 DIAGNOSIS — Z21 Asymptomatic human immunodeficiency virus [HIV] infection status: Secondary | ICD-10-CM | POA: Diagnosis present

## 2017-10-20 DIAGNOSIS — N186 End stage renal disease: Secondary | ICD-10-CM | POA: Diagnosis not present

## 2017-10-20 DIAGNOSIS — Z992 Dependence on renal dialysis: Secondary | ICD-10-CM

## 2017-10-20 DIAGNOSIS — I509 Heart failure, unspecified: Secondary | ICD-10-CM

## 2017-10-20 DIAGNOSIS — J4521 Mild intermittent asthma with (acute) exacerbation: Secondary | ICD-10-CM | POA: Diagnosis not present

## 2017-10-20 DIAGNOSIS — R509 Fever, unspecified: Secondary | ICD-10-CM | POA: Diagnosis not present

## 2017-10-20 DIAGNOSIS — I5022 Chronic systolic (congestive) heart failure: Secondary | ICD-10-CM | POA: Diagnosis present

## 2017-10-20 DIAGNOSIS — J45901 Unspecified asthma with (acute) exacerbation: Secondary | ICD-10-CM | POA: Diagnosis not present

## 2017-10-20 DIAGNOSIS — M79605 Pain in left leg: Secondary | ICD-10-CM

## 2017-10-20 DIAGNOSIS — R05 Cough: Secondary | ICD-10-CM | POA: Diagnosis not present

## 2017-10-20 DIAGNOSIS — I428 Other cardiomyopathies: Secondary | ICD-10-CM

## 2017-10-20 DIAGNOSIS — B20 Human immunodeficiency virus [HIV] disease: Secondary | ICD-10-CM | POA: Diagnosis present

## 2017-10-20 DIAGNOSIS — M7918 Myalgia, other site: Secondary | ICD-10-CM | POA: Diagnosis not present

## 2017-10-20 DIAGNOSIS — F17210 Nicotine dependence, cigarettes, uncomplicated: Secondary | ICD-10-CM | POA: Diagnosis present

## 2017-10-20 LAB — INFLUENZA PANEL BY PCR (TYPE A & B)
Influenza A By PCR: NEGATIVE
Influenza B By PCR: NEGATIVE

## 2017-10-20 LAB — COMPREHENSIVE METABOLIC PANEL
ALBUMIN: 3.5 g/dL (ref 3.5–5.0)
ALT: 19 U/L (ref 17–63)
ANION GAP: 16 — AB (ref 5–15)
AST: 37 U/L (ref 15–41)
Alkaline Phosphatase: 227 U/L — ABNORMAL HIGH (ref 38–126)
BUN: 49 mg/dL — ABNORMAL HIGH (ref 6–20)
CO2: 25 mmol/L (ref 22–32)
Calcium: 8.1 mg/dL — ABNORMAL LOW (ref 8.9–10.3)
Chloride: 101 mmol/L (ref 101–111)
Creatinine, Ser: 9.17 mg/dL — ABNORMAL HIGH (ref 0.61–1.24)
GFR calc non Af Amer: 7 mL/min — ABNORMAL LOW (ref >60)
GFR, EST AFRICAN AMERICAN: 8 mL/min — AB (ref >60)
GLUCOSE: 94 mg/dL (ref 65–99)
POTASSIUM: 3.4 mmol/L — AB (ref 3.5–5.1)
SODIUM: 142 mmol/L (ref 135–145)
TOTAL PROTEIN: 9 g/dL — AB (ref 6.5–8.1)
Total Bilirubin: 1.7 mg/dL — ABNORMAL HIGH (ref 0.3–1.2)

## 2017-10-20 LAB — CBC WITH DIFFERENTIAL/PLATELET
BASOS ABS: 0 10*3/uL (ref 0.0–0.1)
BASOS PCT: 0 %
Eosinophils Absolute: 0.1 10*3/uL (ref 0.0–0.7)
Eosinophils Relative: 2 %
HEMATOCRIT: 31.6 % — AB (ref 39.0–52.0)
HEMOGLOBIN: 10.2 g/dL — AB (ref 13.0–17.0)
LYMPHS PCT: 8 %
Lymphs Abs: 0.5 10*3/uL — ABNORMAL LOW (ref 0.7–4.0)
MCH: 35.2 pg — ABNORMAL HIGH (ref 26.0–34.0)
MCHC: 32.3 g/dL (ref 30.0–36.0)
MCV: 109 fL — ABNORMAL HIGH (ref 78.0–100.0)
MONO ABS: 0.5 10*3/uL (ref 0.1–1.0)
MONOS PCT: 8 %
NEUTROS ABS: 4.9 10*3/uL (ref 1.7–7.7)
Neutrophils Relative %: 82 %
Platelets: 133 10*3/uL — ABNORMAL LOW (ref 150–400)
RBC: 2.9 MIL/uL — ABNORMAL LOW (ref 4.22–5.81)
RDW: 17.7 % — AB (ref 11.5–15.5)
WBC: 6 10*3/uL (ref 4.0–10.5)

## 2017-10-20 LAB — LACTIC ACID, PLASMA
LACTIC ACID, VENOUS: 1.8 mmol/L (ref 0.5–1.9)
Lactic Acid, Venous: 2.1 mmol/L (ref 0.5–1.9)

## 2017-10-20 LAB — TROPONIN I
Troponin I: 0.05 ng/mL (ref <0.03)
Troponin I: 0.05 ng/mL (ref <0.03)

## 2017-10-20 LAB — RAPID STREP SCREEN (MED CTR MEBANE ONLY): STREPTOCOCCUS, GROUP A SCREEN (DIRECT): NEGATIVE

## 2017-10-20 LAB — GLUCOSE, CAPILLARY: GLUCOSE-CAPILLARY: 142 mg/dL — AB (ref 65–99)

## 2017-10-20 MED ORDER — SODIUM CHLORIDE 0.9 % IV SOLN: 100.0000 mL | INTRAVENOUS | Status: DC | PRN

## 2017-10-20 MED ORDER — IPRATROPIUM BROMIDE 0.02 % IN SOLN
0.5000 mg | Freq: Once | RESPIRATORY_TRACT | Status: DC
  Filled 2017-10-20: qty 2.5

## 2017-10-20 MED ORDER — ALBUTEROL (5 MG/ML) CONTINUOUS INHALATION SOLN
10.0000 mg/h | INHALATION_SOLUTION | Freq: Once | RESPIRATORY_TRACT | Status: AC
  Administered 2017-10-20: 10 mg/h via RESPIRATORY_TRACT
  Filled 2017-10-20: qty 20

## 2017-10-20 MED ORDER — LIDOCAINE-PRILOCAINE 2.5-2.5 % EX CREA: 1.0000 "application " | TOPICAL_CREAM | CUTANEOUS | Status: DC | PRN

## 2017-10-20 MED ORDER — ONDANSETRON HCL 4 MG/2ML IJ SOLN: 4.0000 mg | Freq: Four times a day (QID) | INTRAMUSCULAR | Status: DC | PRN

## 2017-10-20 MED ORDER — LEVALBUTEROL HCL 1.25 MG/0.5ML IN NEBU
0.6300 mg | INHALATION_SOLUTION | Freq: Four times a day (QID) | RESPIRATORY_TRACT | Status: DC
  Administered 2017-10-21: 0.63 mg via RESPIRATORY_TRACT
  Administered 2017-10-21: 1.25 mg via RESPIRATORY_TRACT
  Filled 2017-10-20 (×3): qty 0.5

## 2017-10-20 MED ORDER — EPOETIN ALFA 2000 UNIT/ML IJ SOLN: 2000.0000 [IU] | INTRAMUSCULAR | Status: DC

## 2017-10-20 MED ORDER — HEPARIN SODIUM (PORCINE) 1000 UNIT/ML DIALYSIS
20.0000 [IU]/kg | INTRAMUSCULAR | Status: DC | PRN
  Filled 2017-10-20: qty 2

## 2017-10-20 MED ORDER — IPRATROPIUM BROMIDE 0.02 % IN SOLN
0.5000 mg | Freq: Once | RESPIRATORY_TRACT | Status: AC
  Administered 2017-10-20: 0.5 mg via RESPIRATORY_TRACT
  Filled 2017-10-20: qty 2.5

## 2017-10-20 MED ORDER — METHYLPREDNISOLONE SODIUM SUCC 125 MG IJ SOLR
125.0000 mg | Freq: Once | INTRAMUSCULAR | Status: AC
  Administered 2017-10-20: 125 mg via INTRAVENOUS
  Filled 2017-10-20: qty 2

## 2017-10-20 MED ORDER — ETRAVIRINE 100 MG PO TABS
200.0000 mg | ORAL_TABLET | Freq: Two times a day (BID) | ORAL | Status: DC
  Administered 2017-10-20 – 2017-10-21 (×2): 200 mg via ORAL
  Filled 2017-10-20 (×5): qty 2

## 2017-10-20 MED ORDER — OXYCODONE HCL 5 MG PO TABS
5.0000 mg | ORAL_TABLET | Freq: Once | ORAL | Status: AC
  Administered 2017-10-21: 5 mg via ORAL
  Filled 2017-10-20: qty 1

## 2017-10-20 MED ORDER — OXYCODONE HCL 5 MG PO TABS
5.0000 mg | ORAL_TABLET | Freq: Every day | ORAL | Status: DC | PRN
  Administered 2017-10-20: 5 mg via ORAL
  Filled 2017-10-20: qty 1

## 2017-10-20 MED ORDER — LEVALBUTEROL HCL 0.63 MG/3ML IN NEBU
INHALATION_SOLUTION | RESPIRATORY_TRACT | Status: AC
Start: 2017-10-20 — End: 2017-10-20
  Administered 2017-10-20: 0.63 mg
  Filled 2017-10-20: qty 3

## 2017-10-20 MED ORDER — HEPARIN SODIUM (PORCINE) 5000 UNIT/ML IJ SOLN
5000.0000 [IU] | Freq: Three times a day (TID) | INTRAMUSCULAR | Status: DC
  Filled 2017-10-20: qty 1

## 2017-10-20 MED ORDER — METHYLPREDNISOLONE SODIUM SUCC 40 MG IJ SOLR
40.0000 mg | Freq: Three times a day (TID) | INTRAMUSCULAR | Status: DC
  Administered 2017-10-20 – 2017-10-21 (×2): 40 mg via INTRAVENOUS
  Filled 2017-10-20 (×2): qty 1

## 2017-10-20 MED ORDER — ALUM & MAG HYDROXIDE-SIMETH 200-200-20 MG/5ML PO SUSP
15.0000 mL | Freq: Four times a day (QID) | ORAL | Status: DC | PRN
Start: 2017-10-20 — End: 2017-10-21
  Administered 2017-10-20: 15 mL via ORAL
  Filled 2017-10-20: qty 30

## 2017-10-20 MED ORDER — TENOFOVIR DISOPROXIL FUMARATE 300 MG PO TABS: 300.0000 mg | ORAL_TABLET | ORAL | Status: DC

## 2017-10-20 MED ORDER — SULFAMETHOXAZOLE-TRIMETHOPRIM 400-80 MG PO TABS
1.0000 | ORAL_TABLET | ORAL | Status: DC
  Filled 2017-10-20: qty 1

## 2017-10-20 MED ORDER — LIDOCAINE HCL (PF) 1 % IJ SOLN: 5.0000 mL | INTRAMUSCULAR | Status: DC | PRN

## 2017-10-20 MED ORDER — RALTEGRAVIR POTASSIUM 400 MG PO TABS
400.0000 mg | ORAL_TABLET | Freq: Two times a day (BID) | ORAL | Status: DC
  Administered 2017-10-20 – 2017-10-21 (×2): 400 mg via ORAL
  Filled 2017-10-20 (×5): qty 1

## 2017-10-20 MED ORDER — SULFAMETHOXAZOLE-TRIMETHOPRIM 800-160 MG PO TABS: 0.5000 | ORAL_TABLET | ORAL | Status: DC

## 2017-10-20 MED ORDER — METOPROLOL SUCCINATE ER 50 MG PO TB24
50.0000 mg | ORAL_TABLET | Freq: Every day | ORAL | Status: DC
  Administered 2017-10-21: 50 mg via ORAL
  Filled 2017-10-20: qty 1

## 2017-10-20 MED ORDER — EMTRICITABINE 200 MG PO CAPS: 200.0000 mg | ORAL_CAPSULE | ORAL | Status: DC

## 2017-10-20 MED ORDER — ACETAMINOPHEN 650 MG RE SUPP: 650.0000 mg | Freq: Four times a day (QID) | RECTAL | Status: DC | PRN

## 2017-10-20 MED ORDER — PENTAFLUOROPROP-TETRAFLUOROETH EX AERO: 1.0000 "application " | INHALATION_SPRAY | CUTANEOUS | Status: DC | PRN

## 2017-10-20 MED ORDER — IPRATROPIUM BROMIDE 0.02 % IN SOLN
0.5000 mg | Freq: Four times a day (QID) | RESPIRATORY_TRACT | Status: DC
Start: 2017-10-20 — End: 2017-10-21
  Administered 2017-10-20 – 2017-10-21 (×3): 0.5 mg via RESPIRATORY_TRACT
  Filled 2017-10-20 (×3): qty 2.5

## 2017-10-20 MED ORDER — ONDANSETRON HCL 4 MG PO TABS
4.0000 mg | ORAL_TABLET | Freq: Four times a day (QID) | ORAL | Status: DC | PRN
Start: 2017-10-20 — End: 2017-10-21

## 2017-10-20 MED ORDER — IPRATROPIUM BROMIDE 0.02 % IN SOLN
2.5000 mL | Freq: Once | RESPIRATORY_TRACT | Status: AC
Start: 2017-10-20 — End: 2017-10-20
  Administered 2017-10-20: 0.5 mg via RESPIRATORY_TRACT
  Filled 2017-10-20: qty 2.5

## 2017-10-20 MED ORDER — ALBUTEROL (5 MG/ML) CONTINUOUS INHALATION SOLN
10.0000 mg/h | INHALATION_SOLUTION | Freq: Once | RESPIRATORY_TRACT | Status: DC
  Filled 2017-10-20: qty 20

## 2017-10-20 MED ORDER — ACETAMINOPHEN 325 MG PO TABS
650.0000 mg | ORAL_TABLET | Freq: Once | ORAL | Status: AC
  Administered 2017-10-20: 650 mg via ORAL
  Filled 2017-10-20: qty 2

## 2017-10-20 MED ORDER — ACETAMINOPHEN 325 MG PO TABS: 650.0000 mg | ORAL_TABLET | Freq: Four times a day (QID) | ORAL | Status: DC | PRN

## 2017-10-20 NOTE — ED Notes (Signed)
Respiratory pgd

## 2017-10-20 NOTE — ED Notes (Signed)
Date and time results received: 10/20/17 0342 (use smartphrase ".now" to insert current time)  Test: Lactic Critical Value: 2.1  Name of Provider Notified: Lynelle Doctor  Orders Received? Or Actions Taken?:

## 2017-10-20 NOTE — ED Provider Notes (Signed)
[facility] EMERGENCY DEPARTMENT Provider Note   CSN: [id number] Arrival date & time: 10/19/17  1941  Time seen 02:13 AM   History   Chief Complaint Chief Complaint  Patient presents with  . Sore Throat    HPI Dave Estrada is a 27 y.o. male.  HPI patient states 2 days ago he started having body aches, feeling shortness of breath.  Having cough productive of green mucus, chills, and subjective fever.  He states he has been having wheezing however when he uses his nebulizer is not helping.  He also states he has a sore throat.  He states he did have a flu shot this season.  Patient is HIV positive and is followed at [facility].  He also has a history of congestive heart failure and he has end-stage renal disease.  He is due for dialysis today, Saturday at 1030 at [facility] in [place].  PCP no local ID Dr Sedalia Muta at [facility] Dr Darl Householder  Past Medical History:  Diagnosis Date  . A-V fistula (HCC)    Placed on 10/27/2014 at [facility]; for future HD use.   . Asthma   . ESRD (end stage renal disease) (HCC)   . Heart attack (HCC)   . HIV (human immunodeficiency virus infection) (HCC)   . Staphylococcus aureus bacteremia with sepsis (HCC) 09/09/2014  . Systolic dysfunction, left ventricle 12/20/2014   EF 20%     Patient Active Problem List   Diagnosis Date Noted  . Pancreatitis 01/24/2017  . Enteritis 01/24/2017  . Inguinal adenopathy 07/18/2016  . AIDS (acquired immune deficiency syndrome) (HCC)   . Hyperbilirubinemia   . Chronic systolic heart failure (HCC) 01/13/2016  . Nonischemic cardiomyopathy (HCC) 11/27/2015  . Hemoptysis 09/11/2015  . Cough 09/11/2015  . Elevated troponin 09/11/2015  . Protein-calorie malnutrition, severe (HCC) 09/11/2015  . Pneumonia 09/11/2015  . A-V fistula (HCC) 12/18/2014  . Thrombocytopenia, acquired (HCC) 12/18/2014  . Other infectious complication of vascular dialysis catheter 12/17/2014  . 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 Pyrexia   . Vomiting and diarrhea   . Macrocytic anemia 09/10/2014  . Cigarette smoker 09/10/2014  . Liver lesion 09/10/2014  . Asthma, chronic 09/10/2014  . History of MRSA infection 09/10/2014  . MRSA bacteremia 09/10/2014  . Staphylococcus aureus bacteremia with sepsis (HCC) 09/09/2014  . HIV (human immunodeficiency virus infection) (HCC) 09/08/2014  . ESRD on hemodialysis (HCC) 09/08/2014    Past Surgical History:  Procedure Laterality Date  . INSERTION OF DIALYSIS CATHETER Left 09/15/2014   Procedure: INSERTION OF DIALYSIS CATHETER;  Surgeon: Nada Libman, MD;  Location: [facility] OR;  Service: Vascular;  Laterality: Left;  . PORT A CATH REVISION    . TEE WITHOUT CARDIOVERSION N/A 09/14/2014   Procedure: TRANSESOPHAGEAL ECHOCARDIOGRAM (TEE);  Surgeon: Laurey Morale, MD;  Location: [facility] ENDOSCOPY;  Service: Cardiovascular;  Laterality: N/A;  will be at [facility] by mon       Home Medications    Prior to Admission medications   Medication Sig Start Date End Date Taking? Authorizing Provider  albuterol (PROVENTIL HFA;VENTOLIN HFA) 108 (90 BASE) MCG/ACT inhaler Inhale 1-2 puffs into the lungs every 6 (six) hours as needed for wheezing or shortness of breath.    [provider]  cephALEXin (KEFLEX) 500 MG capsule Take 500 mg by mouth 2 (two) times daily. 7 day course starting on 06/07/2017 06/07/17   [provider]  clotrimazole (LOTRIMIN) 1 % cream Apply topically 2 (  two) times daily. 07/22/16   Philip Aspen, Limmie Patricia, MD  emtricitabine (EMTRIVA) 200 MG capsule Take 200 mg by mouth 2 (two) times a week. Taken after dialysis on Tuesday and Thursday    [provider]  etravirine (INTELENCE) 100 MG tablet Take 200 mg by mouth every 12 (twelve) hours.    [provider]  ibuprofen (ADVIL,MOTRIN) 800 MG tablet Take 1 tablet (800 mg total) by mouth 3 (three) times daily. 06/11/17   Eber Hong, MD  metoprolol succinate  (TOPROL-XL) 50 MG 24 hr tablet Take 50 mg by mouth daily.     [provider]  oxyCODONE (OXY IR/ROXICODONE) 5 MG immediate release tablet Take 5 mg by mouth daily as needed for moderate pain.  04/23/17   [provider]  raltegravir (ISENTRESS) 400 MG tablet Take 400 mg by mouth 2 (two) times daily.    [provider]  tenofovir (VIREAD) 300 MG tablet Take 300 mg by mouth once a week. To be taken on Tuesday after dialysis    [provider]    Family History Family History  Problem Relation Age of Onset  . Seizures Father     Social History Social History   Tobacco Use  . Smoking status: Current Every Day Smoker    Packs/day: 0.50    Years: 11.00    Pack years: 5.50    Types: Cigarettes    Start date: 01/28/2003  . Smokeless tobacco: Never Used  Substance Use Topics  . Alcohol use: No    Alcohol/week: 0.0 oz  . Drug use: Yes    Types: Marijuana    Comment: 4-5 days ago  on disability   Allergies   Bee venom; Other; and Aspirin   Review of Systems Review of Systems  All other systems reviewed and are negative.    Physical Exam Updated Vital Signs BP (!) 126/97   Pulse (!) 107   Temp 98 F (36.7 C) (Oral)   Resp 20   Ht 5\' 5"  (1.651 m)   Wt 63.5 kg (140 lb)   SpO2 100%   BMI 23.30 kg/m   Vital signs normal Except tachycardia  Physical Exam  Constitutional: He is oriented to person, place, and time. He appears well-developed and well-nourished.  Non-toxic appearance. He does not appear ill. He appears distressed.  Tall, thin  HENT:  Head: Normocephalic and atraumatic.  Right Ear: External ear normal.  Left Ear: External ear normal.  Nose: Nose normal. No mucosal edema or rhinorrhea.  Mouth/Throat: Mucous membranes are dry. No dental abscesses or uvula swelling.  Eyes: Conjunctivae and EOM are normal. Pupils are equal, round, and reactive to light.  Neck: Normal range of motion and full passive range of motion without  pain. Neck supple.  Cardiovascular: Regular rhythm and normal heart sounds. Tachycardia present. Exam reveals no gallop and no friction rub.  No murmur heard. Pulmonary/Chest: Effort normal. Tachypnea noted. No respiratory distress. He has wheezes. He has no rhonchi. He has no rales. He exhibits no tenderness and no crepitus.  Abdominal: Soft. Normal appearance and bowel sounds are normal. He exhibits no distension. There is no tenderness. There is no rebound and no guarding.  Musculoskeletal: Normal range of motion. He exhibits no edema or tenderness.  Moves all extremities well.   Neurological: He is alert and oriented to person, place, and time. He has normal strength. No cranial nerve deficit.  Skin: Skin is warm, dry and intact. Rash noted. Rash is not  pustular and not urticarial. No erythema. No pallor.  Patient has diffuse scaling and peeling of his skin.  It almost has the appearance of eczema but different.  Psychiatric: He has a normal mood and affect. His speech is normal and behavior is normal. His mood appears not anxious.  Nursing note and vitals reviewed.    ED Treatments / Results  Labs (all labs ordered are listed, but only abnormal results are displayed) Results for orders placed or performed during the hospital encounter of 10/20/17  Rapid strep screen  Result Value Ref Range   Streptococcus, Group A Screen (Direct) NEGATIVE NEGATIVE  Culture, blood (routine x 2)  Result Value Ref Range   Specimen Description BLOOD RIGHT FOREARM    Special Requests      BOTTLES DRAWN AEROBIC AND ANAEROBIC Blood Culture adequate volume   Culture NO GROWTH < 12 HOURS    Report Status PENDING   Culture, blood (routine x 2)  Result Value Ref Range   Specimen Description BLOOD RIGHT FOREARM    Special Requests      BOTTLES DRAWN AEROBIC ONLY Blood Culture adequate volume   Culture NO GROWTH < 12 HOURS    Report Status PENDING   Comprehensive metabolic panel  Result Value Ref Range     Sodium 142 135 - 145 mmol/L   Potassium 3.4 (L) 3.5 - 5.1 mmol/L   Chloride 101 101 - 111 mmol/L   CO2 25 22 - 32 mmol/L   Glucose, Bld 94 65 - 99 mg/dL   BUN 49 (H) 6 - 20 mg/dL   Creatinine, Ser 7.25 (H) 0.61 - 1.24 mg/dL   Calcium 8.1 (L) 8.9 - 10.3 mg/dL   Total Protein 9.0 (H) 6.5 - 8.1 g/dL   Albumin 3.5 3.5 - 5.0 g/dL   AST 37 15 - 41 U/L   ALT 19 17 - 63 U/L   Alkaline Phosphatase 227 (H) 38 - 126 U/L   Total Bilirubin 1.7 (H) 0.3 - 1.2 mg/dL   GFR calc non Af Amer 7 (L) >60 mL/min   GFR calc Af Amer 8 (L) >60 mL/min   Anion gap 16 (H) 5 - 15  CBC with Differential  Result Value Ref Range   WBC 6.0 4.0 - 10.5 K/uL   RBC 2.90 (L) 4.22 - 5.81 MIL/uL   Hemoglobin 10.2 (L) 13.0 - 17.0 g/dL   HCT 36.6 (L) 44.0 - 34.7 %   MCV 109.0 (H) 78.0 - 100.0 fL   MCH 35.2 (H) 26.0 - 34.0 pg   MCHC 32.3 30.0 - 36.0 g/dL   RDW 42.5 (H) 95.6 - 38.7 %   Platelets 133 (L) 150 - 400 K/uL   Neutrophils Relative % 82 %   Neutro Abs 4.9 1.7 - 7.7 K/uL   Lymphocytes Relative 8 %   Lymphs Abs 0.5 (L) 0.7 - 4.0 K/uL   Monocytes Relative 8 %   Monocytes Absolute 0.5 0.1 - 1.0 K/uL   Eosinophils Relative 2 %   Eosinophils Absolute 0.1 0.0 - 0.7 K/uL   Basophils Relative 0 %   Basophils Absolute 0.0 0.0 - 0.1 K/uL  Lactic acid, plasma  Result Value Ref Range   Lactic Acid, Venous 2.1 (HH) 0.5 - 1.9 mmol/L  Lactic acid, plasma  Result Value Ref Range   Lactic Acid, Venous 1.8 0.5 - 1.9 mmol/L   Laboratory interpretation all normal except stable anemia, chronic renal disease with normal potassium, elevated anion gap    EKG  EKG Interpretation None       Radiology Dg Chest 2 View  Result Date: 10/20/2017 CLINICAL DATA:  Cough, fever, and wheezing.  History of HIV. EXAM: CHEST  2 VIEW COMPARISON:  07/17/2016 FINDINGS: Cardiomegaly, progressed from prior exam. Mild vascular congestion. Central peribronchial thickening. No confluent airspace disease. Minimal fluid in the fissures  without subpulmonic effusion. No pneumothorax. No acute osseous abnormalities. IMPRESSION: Progressive cardiomegaly with vascular congestion. Peribronchial thickening may be pulmonary edema or bronchial inflammation. Minimal fluid in the fissures. Findings can be seen with volume overload. Electronically Signed   By: Rubye Oaks M.D.   On: 10/20/2017 03:00    Procedures Procedures (including critical care time)  Medications Ordered in ED Medications  albuterol (PROVENTIL,VENTOLIN) solution continuous neb (10 mg/hr Nebulization Refused 10/20/17 0622)  ipratropium (ATROVENT) nebulizer solution 0.5 mg (0.5 mg Nebulization Refused 10/20/17 0621)  methylPREDNISolone sodium succinate (SOLU-MEDROL) 125 mg/2 mL injection 125 mg (not administered)  albuterol (PROVENTIL,VENTOLIN) solution continuous neb (not administered)  ipratropium (ATROVENT) nebulizer solution 0.5 mg (not administered)  albuterol (PROVENTIL,VENTOLIN) solution continuous neb (10 mg/hr Nebulization Given 10/20/17 0313)  ipratropium (ATROVENT) nebulizer solution 0.5 mg (0.5 mg Inhalation Given 10/20/17 0313)  acetaminophen (TYLENOL) tablet 650 mg (650 mg Oral Given 10/20/17 0559)     Initial Impression / Assessment and Plan / ED Course  I have reviewed the triage vital signs and the nursing notes.  Pertinent labs & imaging results that were available during my care of the patient were reviewed by me and considered in my medical decision making (see chart for details).    Patient was given a continuous nebulizer with albuterol and Atrovent.  He was given acetaminophen for complaints of body aches.  Recheck at 5:30 AM patient has diffuse wheezing now, more prominent than initial exam.  He does have improved air movement.  He states he can feel the wheezing.  A second albuterol and Atrovent continuous nebulizer was ordered.  IV Solu-Medrol was ordered.   6:20 AM when respiratory came to do his continuous nebulizer patient refused to  have it.  He states he wanted something for pain.  6:38 AM patient was discussed with Dr. Wolfgang Phoenix, nephrologist.  He states if patient's admitted he will do dialysis at this facility.  7 AM I went to talk to the patient about refusing his nebulizer.  He seemed a little confused about that.  He then relates to me he was seen at [facility] you today and had 2 nebulizer treatments.  I explained to him that he still wheezing and he is having of severe asthma attack.  I discussed he could die from that by refusing to be treated.  He also was informed he was going to be admitted and get dialyzed here at the hospital.  He states he is willing to take the nebulizer now.  07:24 AM Dr Tat, hospitalist will see patient.   Review of the [place] and [place] shows patient has been on hydromorphone 2 mg tablets in April, and oxycodone 5 mg tablets in May, and twice in July. The highest number was 30.  Final Clinical Impressions(s) / ED Diagnoses   Final diagnoses:  Exacerbation of asthma, unspecified asthma severity, unspecified whether persistent  Acute on chronic congestive heart failure, unspecified heart failure type [facility])  ESRD needing dialysis [facility])    Plan admission  Devoria Albe, MD, Concha Pyo, MD 10/20/17 [phone]

## 2017-10-20 NOTE — Progress Notes (Signed)
Patient made aware that he was to receive a CAT; patient said he was in pain not SOB. Patient stated he had received 2 HHN [place] and Tyenol for his fever. Patient spoke about his condition and refused to take a CAT at this time.

## 2017-10-20 NOTE — Plan of Care (Signed)
Respiratory status improved. Pt stated that his breathing has improved.

## 2017-10-20 NOTE — H&P (Addendum)
History and Physical  Dave Estrada:[id number] DOB: [birth year] DOA: 10/20/2017   PCP: Patient, No Pcp Per   Patient coming from: Home  Chief Complaint: cough, sob  HPI:  Dave Estrada is a 27 y.o. male with medical history of 27 year old male with a history of ESRD secondary HIV nephropathy, AIDS, chronic systolic CHF, asthma, coronary artery disease with history of MI, polysubstance abuse including cocaine, THC, and tobacco presenting with 2-3-day history of coughing, shortness breath,subjective fevers and chills, and myalgias.  Unfortunately, the patient continues to smoke approximately 3-5 cigarettes/day.  He went to the emergency department at [facility] on 10/19/2017.  He was discharged home from the ED with prednisone and albuterol inhaler.  The patient picked up the medicines from the pharmacy, but has yet to take them.  Because he continued to have worsening cough, wheezing, shortness breath here he presented to emergency department.  He denies any recent sick contacts.  He denies any nausea, vomiting, hematemesis, but states that he has been having loose stools for the past 2-3 days.  He denies any hematochezia or melena.  He does not make any urine.  He states that he has been missing his HIV medications for many months now.  He has most difficulty on dialysis days missing doses on those days.  In the emergency department, the patient was afebrile hemodynamically stable saturating 95-100% on room air.  BMP showed a serum creatinine of 9.17.  CBC was essentially unremarkable with hemoglobin at baseline platelets were 133,000.  The patient received bronchodilators and Solu-Medrol in the emergency department.  Chest x-ray showed increased interstitial markings    Assessment/Plan: Acute asthma exacerbation -Likely incited by viral infection -Viral respiratory panel -Continue Xopenex/Atrovent -Continue IV Solu-Medrol -Start Pulmicort  ESRD -Secondary to HIV  nephropathy -pt compliant with outpt HD -I have consulted renal for maintenance dialysis  Sinus tachycardia/atypical chest discomfort -EKG -Cycle troponins  AIDS -04/23/2017 CD4 count 105/15.5% -05/03/2017-HIV RNA--30561 -Patient endorses multiple missed doses of medications/poor compliance -Continue trimethoprim sulfamethoxazole for PCP prophylaxis -Repeat CD4 count and HIV RNA -Continue Truvada, etravirine, Isentress, Viread -He is not able to identify his medications nor how often he takes them  Nonischemic cardiomyopathy/chronic systolic CHF -01/19/2016 Echo EF 15-20%, diffuse HK, moderate to severe decreased RV function -Continue metoprolol succinate  Polysubstance abuse -Including tobacco, cocaine, THC -Cessation discussed  Anemia of CKD -Baseline hemoglobin ~9  Diarrhea -Likely related to the patient's acute illness -Stool pathogen panel -C. difficile  Thrombocytopenia -likely due to AIDS with possibly superimposed with acute viral infection -am CBC        Past Medical History:  Diagnosis Date  . 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   . Asthma   . ESRD (end stage renal disease) (HCC)   . Heart attack (HCC)   . HIV (human immunodeficiency virus infection) (HCC)   . Staphylococcus aureus bacteremia with sepsis (HCC) 09/09/2014  . Systolic dysfunction, left ventricle 12/20/2014   EF 20%    Past Surgical History:  Procedure Laterality Date  . INSERTION OF DIALYSIS CATHETER Left 09/15/2014   Procedure: INSERTION OF DIALYSIS CATHETER;  Surgeon: Nada Libman, MD;  Location: [facility] OR;  Service: Vascular;  Laterality: Left;  . PORT A CATH REVISION    . TEE WITHOUT CARDIOVERSION N/A 09/14/2014   Procedure: TRANSESOPHAGEAL ECHOCARDIOGRAM (TEE);  Surgeon: Laurey Morale, MD;  Location: [facility] ENDOSCOPY;  Service: Cardiovascular;  Laterality:  N/A;  will be at [facility] by mon   Social History:  reports that he has been smoking cigarettes.   He started smoking about 14 years ago. He has a 5.50 pack-year smoking history. he has never used smokeless tobacco. He reports that he uses drugs. Drug: Marijuana. He reports that he does not drink alcohol.   Family History  Problem Relation Age of Onset  . Seizures Father      Allergies  Allergen Reactions  . Bee Venom Anaphylaxis  . Other Anaphylaxis    mushrooms  . Aspirin Other (See Comments)    Reaction is unknown     Prior to Admission medications   Medication Sig Start Date End Date Taking? Authorizing Provider  albuterol (PROVENTIL HFA;VENTOLIN HFA) 108 (90 BASE) MCG/ACT inhaler Inhale 1-2 puffs into the lungs every 6 (six) hours as needed for wheezing or shortness of breath.    [provider]  cephALEXin (KEFLEX) 500 MG capsule Take 500 mg by mouth 2 (two) times daily. 7 day course starting on 06/07/2017 06/07/17   [provider]  clotrimazole (LOTRIMIN) 1 % cream Apply topically 2 (two) times daily. 07/22/16   Philip Aspen, Limmie Patricia, MD  emtricitabine (EMTRIVA) 200 MG capsule Take 200 mg by mouth 2 (two) times a week. Taken after dialysis on Tuesday and Thursday    [provider]  etravirine (INTELENCE) 100 MG tablet Take 200 mg by mouth every 12 (twelve) hours.    [provider]  ibuprofen (ADVIL,MOTRIN) 800 MG tablet Take 1 tablet (800 mg total) by mouth 3 (three) times daily. 06/11/17   Eber Hong, MD  metoprolol succinate (TOPROL-XL) 50 MG 24 hr tablet Take 50 mg by mouth daily.     [provider]  oxyCODONE (OXY IR/ROXICODONE) 5 MG immediate release tablet Take 5 mg by mouth daily as needed for moderate pain.  04/23/17   [provider]  raltegravir (ISENTRESS) 400 MG tablet Take 400 mg by mouth 2 (two) times daily.    [provider]  tenofovir (VIREAD) 300 MG tablet Take 300 mg by mouth once a week. To be taken on Tuesday after dialysis    [provider]    Review of Systems:    Constitutional:  No weight loss, night sweats,  Head&Eyes: No headache.  No vision loss.  No eye pain or scotoma ENT:  No Difficulty swallowing,Tooth/dental problems,Sore throat,  No ear ache, post nasal drip,  Cardio-vascular:  No chest pain, Orthopnea, PND, swelling in lower extremities,  dizziness, palpitations  GI:  No  abdominal pain, nausea, vomiting, diarrhea, loss of appetite, hematochezia, melena, heartburn, indigestion, Resp:   No coughing up of blood .No wheezing.No chest wall deformity  Skin:  no rash or lesions.  GU:  no dysuria, change in color of urine, no urgency or frequency. No flank pain.  Musculoskeletal:   No decreased range of motion. No back pain.  Psych:  No change in mood or affect. No depression or anxiety. Neurologic: No headache, no dysesthesia, no focal weakness, no vision loss. No syncope  Physical Exam: Vitals:   10/20/17 0311 10/20/17 0400 10/20/17 0500 10/20/17 0555  BP:  (!) 130/92 (!) 126/97   Pulse:  (!) 109 (!) 107   Resp:  18 20   Temp:    98 F (36.7 C)  TempSrc:    Oral  SpO2: 98% 100% 100%   Weight:      Height:       General:  A&O x 3, NAD, nontoxic, pleasant/cooperative Head/Eye: No conjunctival hemorrhage, no icterus, /AT, No nystagmus ENT:  No icterus,  No thrush, good dentition, no pharyngeal exudate Neck:  No masses, no lymphadenpathy, no bruits CV:  RRR, no rub, no gallop, no S3 Lung: Bilateral rales with expiratory wheeze.  Good air movement. Abdomen: soft/NT, +BS, nondistended, no peritoneal signs Ext: No cyanosis, No rashes, No petechiae, No lymphangitis, No edema Neuro: CNII-XII intact, strength 4/5 in bilateral upper and lower extremities, no dysmetria  Labs on Admission:  Basic Metabolic Panel: Recent Labs  Lab 10/20/17 0231  NA 142  K 3.4*  CL 101  CO2 25  GLUCOSE 94  BUN 49*  CREATININE 9.17*  CALCIUM 8.1*   Liver Function Tests: Recent Labs  Lab 10/20/17 0231  AST 37  ALT 19  ALKPHOS 227*   BILITOT 1.7*  PROT 9.0*  ALBUMIN 3.5   No results for input(s): LIPASE, AMYLASE in the last 168 hours. No results for input(s): AMMONIA in the last 168 hours. CBC: Recent Labs  Lab 10/20/17 0231  WBC 6.0  NEUTROABS 4.9  HGB 10.2*  HCT 31.6*  MCV 109.0*  PLT 133*   Coagulation Profile: No results for input(s): INR, PROTIME in the last 168 hours. Cardiac Enzymes: No results for input(s): CKTOTAL, CKMB, CKMBINDEX, TROPONINI in the last 168 hours. BNP: Invalid input(s): POCBNP CBG: No results for input(s): GLUCAP in the last 168 hours. Urine analysis:    Component Value Date/Time   COLORURINE AMBER (A) 07/18/2016 0123   APPEARANCEUR HAZY (A) 07/18/2016 0123   LABSPEC 1.015 07/18/2016 0123   PHURINE 7.5 07/18/2016 0123   GLUCOSEU 100 (A) 07/18/2016 0123   HGBUR MODERATE (A) 07/18/2016 0123   BILIRUBINUR MODERATE (A) 07/18/2016 0123   KETONESUR NEGATIVE 07/18/2016 0123   PROTEINUR >300 (A) 07/18/2016 0123   UROBILINOGEN 0.2 12/17/2014 0017   NITRITE NEGATIVE 07/18/2016 0123   LEUKOCYTESUR SMALL (A) 07/18/2016 0123   Sepsis Labs: @LABRCNTIP (procalcitonin:4,lacticidven:4) ) Recent Results (from the past 240 hour(s))  Rapid strep screen     Status: None   Collection Time: 10/20/17  1:36 AM  Result Value Ref Range Status   Streptococcus, Group A Screen (Direct) NEGATIVE NEGATIVE Final    Comment: (NOTE) A Rapid Antigen test may result negative if the antigen level in the sample is below the detection level of this test. The FDA has not cleared this test as a stand-alone test therefore the rapid antigen negative result has reflexed to a Group A Strep culture.   Culture, blood (routine x 2)     Status: None (Preliminary result)   Collection Time: 10/20/17  2:33 AM  Result Value Ref Range Status   Specimen Description BLOOD RIGHT FOREARM  Final   Special Requests   Final    BOTTLES DRAWN AEROBIC AND ANAEROBIC Blood Culture adequate volume   Culture NO GROWTH < 12  HOURS  Final   Report Status PENDING  Incomplete  Culture, blood (routine x 2)     Status: None (Preliminary result)   Collection Time: 10/20/17  2:47 AM  Result Value Ref Range Status   Specimen Description BLOOD RIGHT FOREARM  Final   Special Requests   Final    BOTTLES DRAWN AEROBIC ONLY Blood Culture adequate volume   Culture NO GROWTH < 12 HOURS  Final   Report Status PENDING  Incomplete     Radiological Exams on Admission: Dg Chest 2 View  Result Date: 10/20/2017 CLINICAL DATA:  Cough, fever,  and wheezing.  History of HIV. EXAM: CHEST  2 VIEW COMPARISON:  07/17/2016 FINDINGS: Cardiomegaly, progressed from prior exam. Mild vascular congestion. Central peribronchial thickening. No confluent airspace disease. Minimal fluid in the fissures without subpulmonic effusion. No pneumothorax. No acute osseous abnormalities. IMPRESSION: Progressive cardiomegaly with vascular congestion. Peribronchial thickening may be pulmonary edema or bronchial inflammation. Minimal fluid in the fissures. Findings can be seen with volume overload. Electronically Signed   By: Rubye Oaks M.D.   On: 10/20/2017 03:00    EKG: Independently reviewed. pending    Time spent:70 minutes Code Status: FULL Family Communication:  No Family at bedside Disposition Plan: expect 1-2 day hospitalization Consults called: renal DVT Prophylaxis: [place] Heparin     Catarina Hartshorn, DO  [facility] Pager [phone]  If 7PM-7AM, please contact night-coverage [email or web address] Password [facility] 10/20/2017, 8:13 AM

## 2017-10-20 NOTE — ED Notes (Signed)
Respiratory paged

## 2017-10-20 NOTE — Procedures (Signed)
     HEMODIALYSIS TREATMENT NOTE:   3.5 hour heparin-free dialysis completed via left upper arm AVG (15g ante/retrograde). Goal met: 2.7 liters removed without interruption in ultrafiltration.  All blood was returned and hemostasis was achieved within 10 minutes. Report called to Kearney Hard, RN.  Rockwell Alexandria, RN, CDN

## 2017-10-20 NOTE — ED Notes (Signed)
EDP made aware of pt refusing another continuous neb.

## 2017-10-20 NOTE — ED Notes (Signed)
Per Selena Batten with respiratory, the pt refused his continuous neb. Pt states that he was seen in [place] before being seen here and was given 2 breathing treatments and tylenol for fever and body aches. Pt states he received 1 continuous neb here and that is not what he is here for. Per respiratory, the pt stated that he was here for his fever and body aches and needed something other than tylenol.

## 2017-10-20 NOTE — Consult Note (Signed)
Dave Estrada MRN: [id number] DOB/AGE: 22-Apr-1991 26 y.o. Primary Care Physician:Patient, No Pcp Per Admit date: 10/20/2017 Chief Complaint:  Chief Complaint  Patient presents with  . Sore Throat   HPI: Pt is 27 year old male with past medical hx of ESRD who presented to Emergency room with c/o Sorethroat  HPI dates back to  2 days ago when  pt started having body aches, feeling shortness of breath.  Pt also c/o  cough ,productive of green mucus. Pt also c/o chills, and subjective fever.  Pt also c/o  Wheezing.  He also states he has a sore throat NO c/o chest pain No c/o nausea/ vomiting No c/o syncope No c/o recent travel Pt last Dialysis was on Thursday  Past Medical History:  Diagnosis Date  . A-V fistula (HCC)    Placed on 10/27/2014 at [facility]; for future HD use.   . Asthma   . ESRD (end stage renal disease) (HCC)   . Heart attack (HCC)   . HIV (human immunodeficiency virus infection) (HCC)   . Staphylococcus aureus bacteremia with sepsis (HCC) 09/09/2014  . Systolic dysfunction, left ventricle 12/20/2014   EF 20%         Family History  Problem Relation Age of Onset  . Seizures Father     Social History:  reports that he has been smoking cigarettes.  He started smoking about 14 years ago. He has a 5.50 pack-year smoking history. he has never used smokeless tobacco. He reports that he uses drugs. Drug: Marijuana. He reports that he does not drink alcohol.  Allergies:  Allergies  Allergen Reactions  . Bee Venom Anaphylaxis  . Other Anaphylaxis    mushrooms  . Aspirin Other (See Comments)    Reaction is unknown    Medications Prior to Admission  Medication Sig Dispense Refill  . albuterol (PROVENTIL HFA;VENTOLIN HFA) 108 (90 BASE) MCG/ACT inhaler Inhale 1-2 puffs into the lungs every 6 (six) hours as needed for wheezing or shortness of breath.    . cephALEXin (KEFLEX) 500 MG capsule Take 500 mg by mouth 2 (two) times daily. 7 day course starting on 06/07/2017     . clotrimazole (LOTRIMIN) 1 % cream Apply topically 2 (two) times daily. 30 g 0  . emtricitabine (EMTRIVA) 200 MG capsule Take 200 mg by mouth 2 (two) times a week. Taken after dialysis on Tuesday and Thursday    . etravirine (INTELENCE) 100 MG tablet Take 200 mg by mouth every 12 (twelve) hours.    Marland Kitchen ibuprofen (ADVIL,MOTRIN) 800 MG tablet Take 1 tablet (800 mg total) by mouth 3 (three) times daily. 21 tablet 0  . metoprolol succinate (TOPROL-XL) 50 MG 24 hr tablet Take 50 mg by mouth daily.     Marland Kitchen oxyCODONE (OXY IR/ROXICODONE) 5 MG immediate release tablet Take 5 mg by mouth daily as needed for moderate pain.     . raltegravir (ISENTRESS) 400 MG tablet Take 400 mg by mouth 2 (two) times daily.    Marland Kitchen tenofovir (VIREAD) 300 MG tablet Take 300 mg by mouth once a week. To be taken on Tuesday after dialysis         XLK:GMWNU from the symptoms mentioned above,there are no other symptoms referable to all systems reviewed.  Marland Kitchen albuterol  10 mg/hr Nebulization Once  . [START ON 10/22/2017] emtricitabine  200 mg Oral Once per day on Mon Thu  . etravirine  200 mg Oral Q12H  . heparin  5,000 Units Subcutaneous Q8H  .  ipratropium  0.5 mg Nebulization Once  . ipratropium  0.5 mg Nebulization Q6H  . levalbuterol  0.63 mg Nebulization Q6H  . methylPREDNISolone (SOLU-MEDROL) injection  40 mg Intravenous Q8H  . metoprolol succinate  50 mg Oral Daily  . raltegravir  400 mg Oral BID  . sulfamethoxazole-trimethoprim  0.5 tablet Oral Q T,Th,Sa-HD  . tenofovir  300 mg Oral Weekly     Physical Exam: Vital signs in last 24 hours: Temp:  [97.6 F (36.4 C)-98.6 F (37 C)] 97.6 F (36.4 C) (01/05 1003) Pulse Rate:  [107-116] 108 (01/05 1003) Resp:  [14-20] 18 (01/05 1003) BP: (116-130)/(77-97) 128/81 (01/05 1003) SpO2:  [97 %-100 %] 100 % (01/05 1003) Weight:  [140 lb (63.5 kg)] 140 lb (63.5 kg) (01/04 2035) Weight change:     Intake/Output from previous day: No intake/output data recorded. No  intake/output data recorded.   Physical Exam: General- pt is awake,alert, oriented to time place and person Resp- No acute REsp distress,wheeszing (Minimal) CVS- S1S2 regular in rate and rhythm GIT- BS+, soft, NT, ND EXT- NO LE Edema, Cyanosis CNS- CN 2-12 grossly intact. Moving all 4 extremities Psych- normal mood and affect Access- AVG +     Lab Results: CBC Recent Labs    10/20/17 0231  WBC 6.0  HGB 10.2*  HCT 31.6*  PLT 133*    BMET Recent Labs    10/20/17 0231  NA 142  K 3.4*  CL 101  CO2 25  GLUCOSE 94  BUN 49*  CREATININE 9.17*  CALCIUM 8.1*    MICRO Recent Results (from the past 240 hour(s))  Rapid strep screen     Status: None   Collection Time: 10/20/17  1:36 AM  Result Value Ref Range Status   Streptococcus, Group A Screen (Direct) NEGATIVE NEGATIVE Final    Comment: (NOTE) A Rapid Antigen test may result negative if the antigen level in the sample is below the detection level of this test. The FDA has not cleared this test as a stand-alone test therefore the rapid antigen negative result has reflexed to a Group A Strep culture.   Culture, blood (routine x 2)     Status: None (Preliminary result)   Collection Time: 10/20/17  2:33 AM  Result Value Ref Range Status   Specimen Description BLOOD RIGHT FOREARM  Final   Special Requests   Final    BOTTLES DRAWN AEROBIC AND ANAEROBIC Blood Culture adequate volume   Culture NO GROWTH < 12 HOURS  Final   Report Status PENDING  Incomplete  Culture, blood (routine x 2)     Status: None (Preliminary result)   Collection Time: 10/20/17  2:47 AM  Result Value Ref Range Status   Specimen Description BLOOD RIGHT FOREARM  Final   Special Requests   Final    BOTTLES DRAWN AEROBIC ONLY Blood Culture adequate volume   Culture NO GROWTH < 12 HOURS  Final   Report Status PENDING  Incomplete      Lab Results  Component Value Date   CALCIUM 8.1 (L) 10/20/2017   PHOS 4.0 01/26/2017     Flu-Negative  IMPRESSION: Progressive cardiomegaly with vascular congestion. Peribronchial thickening may be pulmonary edema or bronchial inflammation. Minimal fluid in the fissures. Findings can be seen with volume overload.   Impression: 1)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               Pt is On Tuesday/Thursday/Saturday schedule  Pt last HD session was on Thursday                Will dialyze in am                Pt with PC/tunnelled cath                  Multiple AVF failed to mature  2)HTN BP stable   3)Anemia In ESRD the goal for HGb is 9--11. Will keep on  Epo   4)CKD Mineral-Bone Disorder Calcium Near to goal when corrected for low albumin Phos  at goal.    5)ID- hx of HIV On HAART  6)Electrolytes   Hypokalemic normonatremic  7)Acid base Co2  at goal   8) Resp-admitted with dyspnea Primary team following   Plan:  Will dialyze today Will use 3 k/4K  [place] as pt is hypokalemic Will keep on epo Will try to take  2.5 liters off.   Sundai Probert S 10/20/2017, 10:56 AM

## 2017-10-21 ENCOUNTER — Observation Stay (HOSPITAL_COMMUNITY): Payer: Medicare Other

## 2017-10-21 DIAGNOSIS — B20 Human immunodeficiency virus [HIV] disease: Secondary | ICD-10-CM | POA: Diagnosis not present

## 2017-10-21 DIAGNOSIS — I5022 Chronic systolic (congestive) heart failure: Secondary | ICD-10-CM | POA: Diagnosis not present

## 2017-10-21 DIAGNOSIS — F17210 Nicotine dependence, cigarettes, uncomplicated: Secondary | ICD-10-CM

## 2017-10-21 DIAGNOSIS — J4521 Mild intermittent asthma with (acute) exacerbation: Secondary | ICD-10-CM | POA: Diagnosis not present

## 2017-10-21 DIAGNOSIS — N186 End stage renal disease: Secondary | ICD-10-CM | POA: Diagnosis not present

## 2017-10-21 DIAGNOSIS — J45901 Unspecified asthma with (acute) exacerbation: Secondary | ICD-10-CM | POA: Diagnosis not present

## 2017-10-21 DIAGNOSIS — Z992 Dependence on renal dialysis: Secondary | ICD-10-CM | POA: Diagnosis not present

## 2017-10-21 LAB — BASIC METABOLIC PANEL
ANION GAP: 19 — AB (ref 5–15)
BUN: 37 mg/dL — AB (ref 6–20)
CALCIUM: 8.5 mg/dL — AB (ref 8.9–10.3)
CO2: 22 mmol/L (ref 22–32)
Chloride: 98 mmol/L — ABNORMAL LOW (ref 101–111)
Creatinine, Ser: 6.83 mg/dL — ABNORMAL HIGH (ref 0.61–1.24)
GFR calc Af Amer: 11 mL/min — ABNORMAL LOW (ref >60)
GFR, EST NON AFRICAN AMERICAN: 10 mL/min — AB (ref >60)
GLUCOSE: 134 mg/dL — AB (ref 65–99)
Potassium: 4.8 mmol/L (ref 3.5–5.1)
Sodium: 139 mmol/L (ref 135–145)

## 2017-10-21 LAB — HIV-1 RNA QUANT-NO REFLEX-BLD
HIV 1 RNA QUANT: 74200 {copies}/mL
LOG10 HIV-1 RNA: 4.87 {Log_copies}/mL

## 2017-10-21 LAB — CBC
HCT: 32.4 % — ABNORMAL LOW (ref 39.0–52.0)
HEMOGLOBIN: 10.7 g/dL — AB (ref 13.0–17.0)
MCH: 35.8 pg — AB (ref 26.0–34.0)
MCHC: 33 g/dL (ref 30.0–36.0)
MCV: 108.4 fL — ABNORMAL HIGH (ref 78.0–100.0)
Platelets: 103 10*3/uL — ABNORMAL LOW (ref 150–400)
RBC: 2.99 MIL/uL — ABNORMAL LOW (ref 4.22–5.81)
RDW: 18 % — AB (ref 11.5–15.5)
WBC: 8.5 10*3/uL (ref 4.0–10.5)

## 2017-10-21 LAB — RESPIRATORY PANEL BY PCR
ADENOVIRUS-RVPPCR: NOT DETECTED
Bordetella pertussis: NOT DETECTED
CHLAMYDOPHILA PNEUMONIAE-RVPPCR: NOT DETECTED
CORONAVIRUS HKU1-RVPPCR: NOT DETECTED
Coronavirus 229E: NOT DETECTED
Coronavirus NL63: NOT DETECTED
Coronavirus OC43: NOT DETECTED
Influenza A: NOT DETECTED
Influenza B: NOT DETECTED
MYCOPLASMA PNEUMONIAE-RVPPCR: NOT DETECTED
Metapneumovirus: NOT DETECTED
Parainfluenza Virus 1: NOT DETECTED
Parainfluenza Virus 2: NOT DETECTED
Parainfluenza Virus 3: NOT DETECTED
Parainfluenza Virus 4: NOT DETECTED
Respiratory Syncytial Virus: NOT DETECTED
Rhinovirus / Enterovirus: NOT DETECTED

## 2017-10-21 LAB — GASTROINTESTINAL PANEL BY PCR, STOOL (REPLACES STOOL CULTURE)
Adenovirus F40/41: NOT DETECTED
Astrovirus: NOT DETECTED
CYCLOSPORA CAYETANENSIS: NOT DETECTED
Campylobacter species: NOT DETECTED
Cryptosporidium: NOT DETECTED
Entamoeba histolytica: NOT DETECTED
Enteroaggregative E coli (EAEC): NOT DETECTED
Enteropathogenic E coli (EPEC): NOT DETECTED
Enterotoxigenic E coli (ETEC): NOT DETECTED
Giardia lamblia: NOT DETECTED
Norovirus GI/GII: NOT DETECTED
Plesimonas shigelloides: NOT DETECTED
Rotavirus A: NOT DETECTED
SALMONELLA SPECIES: NOT DETECTED
SHIGA LIKE TOXIN PRODUCING E COLI (STEC): NOT DETECTED
SHIGELLA/ENTEROINVASIVE E COLI (EIEC): NOT DETECTED
Sapovirus (I, II, IV, and V): NOT DETECTED
VIBRIO CHOLERAE: NOT DETECTED
VIBRIO SPECIES: NOT DETECTED
Yersinia enterocolitica: NOT DETECTED

## 2017-10-21 LAB — TROPONIN I: Troponin I: 0.04 ng/mL (ref <0.03)

## 2017-10-21 MED ORDER — PREDNISONE 20 MG PO TABS: 60.0000 mg | ORAL_TABLET | Freq: Every day | ORAL | Status: DC

## 2017-10-21 MED ORDER — SULFAMETHOXAZOLE-TRIMETHOPRIM 400-80 MG PO TABS: 1.0000 | ORAL_TABLET | ORAL | 0 refills | Status: DC

## 2017-10-21 NOTE — Plan of Care (Signed)
Pt understands condition and has the knowledge to manage health related needs.

## 2017-10-21 NOTE — Progress Notes (Signed)
Dave Estrada  MRN: [id number]  DOB/AGE: [birth year] 27 y.o.  Primary Care Physician:Patient, No Pcp Per  Admit date: 10/20/2017  Chief Complaint:  Chief Complaint  Patient presents with  . Sore Throat    S-Pt presented on  10/20/2017 with  Chief Complaint  Patient presents with  . Sore Throat  .    Pt today feels better   . albuterol  10 mg/hr Nebulization Once  . [START ON 10/23/2017] emtricitabine  200 mg Oral Once per day on Tue Thu  . [START ON 10/23/2017] epoetin (EPOGEN/PROCRIT) injection  2,000 Units Subcutaneous Q T,Th,Sa-HD  . etravirine  200 mg Oral Q12H  . heparin  5,000 Units Subcutaneous Q8H  . ipratropium  0.5 mg Nebulization Once  . ipratropium  0.5 mg Nebulization Q6H  . levalbuterol  0.63 mg Nebulization Q6H  . methylPREDNISolone (SOLU-MEDROL) injection  40 mg Intravenous Q8H  . metoprolol succinate  50 mg Oral Daily  . raltegravir  400 mg Oral BID  . sulfamethoxazole-trimethoprim  0.5 tablet Oral Q T,Th,Sa-HD  . [START ON 10/23/2017] tenofovir  300 mg Oral Q Tue-HD         Physical Exam: Vital signs in last 24 hours: Temp:  [97.6 F (36.4 C)-98.3 F (36.8 C)] 98.3 F (36.8 C) (01/06 0455) Pulse Rate:  [108-117] 112 (01/06 0455) Resp:  [16-18] 18 (01/06 0455) BP: (93-136)/(62-83) 115/79 (01/06 0455) SpO2:  [94 %-100 %] 94 % (01/06 0801) Weight:  [135 lb 9.3 oz (61.5 kg)-141 lb 8.6 oz (64.2 kg)] 135 lb 9.3 oz (61.5 kg) (01/05 2326) Weight change: 1 lb 8.6 oz (0.697 kg) Last BM Date: 10/20/17  Intake/Output from previous day: 01/05 0701 - 01/06 0700 In: 240 [P.O.:240] Out: 2700  No intake/output data recorded.   Physical Exam: General- pt is awake,alert, oriented to time place and person Resp- No acute REsp distress,wheeszing (Minimal) CVS- S1S2 regular in rate and rhythm GIT- BS+, soft, NT, ND EXT- NO LE Edema, Cyanosis Access- AVG +       Lab Results: CBC Recent Labs    10/20/17 0231 10/21/17 0631  WBC 6.0 8.5  HGB 10.2*  10.7*  HCT 31.6* 32.4*  PLT 133* 103*    BMET Recent Labs    10/20/17 0231 10/21/17 0631  NA 142 139  K 3.4* 4.8  CL 101 98*  CO2 25 22  GLUCOSE 94 134*  BUN 49* 37*  CREATININE 9.17* 6.83*  CALCIUM 8.1* 8.5*    MICRO Recent Results (from the past 240 hour(s))  Rapid strep screen     Status: None   Collection Time: 10/20/17  1:36 AM  Result Value Ref Range Status   Streptococcus, Group A Screen (Direct) NEGATIVE NEGATIVE Final    Comment: (NOTE) A Rapid Antigen test may result negative if the antigen level in the sample is below the detection level of this test. The FDA has not cleared this test as a stand-alone test therefore the rapid antigen negative result has reflexed to a Group A Strep culture.   Culture, blood (routine x 2)     Status: None (Preliminary result)   Collection Time: 10/20/17  2:33 AM  Result Value Ref Range Status   Specimen Description BLOOD RIGHT FOREARM  Final   Special Requests   Final    BOTTLES DRAWN AEROBIC AND ANAEROBIC Blood Culture adequate volume   Culture NO GROWTH 1 DAY  Final   Report Status PENDING  Incomplete  Culture, blood (routine x 2)  Status: None (Preliminary result)   Collection Time: 10/20/17  2:47 AM  Result Value Ref Range Status   Specimen Description BLOOD RIGHT FOREARM  Final   Special Requests   Final    BOTTLES DRAWN AEROBIC ONLY Blood Culture adequate volume   Culture NO GROWTH 1 DAY  Final   Report Status PENDING  Incomplete      Lab Results  Component Value Date   CALCIUM 8.5 (L) 10/21/2017   PHOS 4.0 01/26/2017               Impression: 1)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               Pt is On Tuesday/Thursday/Saturday schedule               Pt last HD session was on Saturdayday              no need for hd today               2)HTN BP stable   3)Anemia In ESRD the goal for HGb is 9--11. Will keep on  Epo   4)CKD Mineral-Bone Disorder Calcium Near to goal when corrected for low  albumin Phos  at goal.    5)ID- hx of HIV On HAART  6)Electrolytes   Hypokalemic normonatremic  7)Acid base Co2  at goal   8) Resp-admitted with dyspnea Primary team following      Plan:  NO need for HD today.      BHUTANI,MANPREET S 10/21/2017, 8:57 AM

## 2017-10-21 NOTE — Discharge Summary (Signed)
Physician Discharge Summary  Dave Estrada ZOX:[id number] DOB: May 06, 1991 DOA: 10/20/2017  PCP: Patient, No Pcp Per  Admit date: 10/20/2017 Discharge date: 10/21/2017  Admitted From: Home Disposition:  Home  Recommendations for Outpatient Follow-up:  1. Follow up with PCP in 1-2 weeks 2. Please obtain BMP/CBC in one week  Discharge Condition: Stable CODE STATUS: FULL Diet recommendation: Heart Healthy   Brief/Interim Summary: 27 y.o.malewith medical history of45 year old male with a history of ESRD secondary HIV nephropathy, AIDS, chronic systolic CHF, asthma, coronary artery disease with history of MI, polysubstance abuse including cocaine, THC, and tobacco presenting with 2-3-day history of coughing, shortness breath,subjective fevers and chills, and myalgias. Unfortunately, the patient continues to smoke approximately 3-5 cigarettes/day. He went to the emergency department at [facility] on 10/19/2017. He was discharged home from the ED with prednisone and albuterol inhaler. The patient picked up the medicines from the pharmacy, but has yet to take them. Because he continued to have worsening cough, wheezing, shortness breath here he presented to emergency department. He denies any recent sick contacts. He denies any nausea, vomiting, hematemesis, but states that he has been having loose stools for the past 2-3 days.  He states that this has been a chronic problem for him for the last 4-5 years.  There is no hematochezia or melena.  Chest x-ray showed increased interstitial markings.  He has been compliant with dialysis.  In the outpatient setting.  The patient was admitted for treatment of asthma exacerbation    Discharge Diagnoses:  Acute asthma exacerbation -Likely incited by viral infection -Viral respiratory panel--neg -influenza pcr--neg -Continue Xopenex/Atrovent -Continue IV Solu-Medrol>>>home with prednisone--pt already had Rx from [facility] for 60 mg daily x 5  days -ambulatory pulse ox on RA did not show any de-saturation  ESRD -Secondary to HIV nephropathy -pt compliant with outpt HD -consulted renal formaintenance dialysis--had HD 10/20/17  atypical chest discomfort/Elevated troponin -EKG--personally reviewed--sinus, nonspecific flat T waves -Cycle troponins--0.05 x 2-->0.04 -elevated troponin due to cardiomyopathy and ESRD -resolved as respiratory status improved  AIDS -04/23/2017 CD4 count 105/15.5% -05/03/2017-HIV RNA--30561 -Patient endorses multiple missed doses of medications/poor compliance -Continue trimethoprim sulfamethoxazole for PCP prophylaxis -Repeat CD4 count and HIV RNA -Continue Truvada, etravirine, Isentress, Viread -He is not able to identify his medications nor how often he takes them  Nonischemic cardiomyopathy/chronic systolic CHF -01/19/2016 Echo EF 15-20%,diffuse HK, moderate to severe decreased RV function -Continue metoprolol succinate -clincally compensated -outpt cardiology followup  Polysubstance abuse -Including tobacco, cocaine, THC -Cessation discussed  Anemia of CKD -Baseline hemoglobin~9  Diarrhea -has been chronic problem since 2014 -Stool pathogen panel--results pending at time of d/c -no abdominal pain.  Tolerating diet -outpt GI consult  Thrombocytopenia -likely due to AIDS with possibly superimposed with acute viral infection -no bleeding during hospitalization  Leg pain -venous duplex--neg for DVT    Discharge Instructions   Allergies as of 10/21/2017      Reactions   Bee Venom Anaphylaxis   Other Anaphylaxis   mushrooms   Aspirin Other (See Comments)   Reaction is unknown      Medication List    STOP taking these medications   cephALEXin 500 MG capsule Commonly known as:  KEFLEX   clotrimazole 1 % cream Commonly known as:  LOTRIMIN     TAKE these medications   albuterol 108 (90 Base) MCG/ACT inhaler Commonly known as:  PROVENTIL HFA;VENTOLIN HFA Inhale  1-2 puffs into the lungs every 6 (six) hours as needed for wheezing or shortness of  breath.   emtricitabine 200 MG capsule Commonly known as:  EMTRIVA Take 200 mg by mouth 2 (two) times a week. Taken after dialysis on Tuesday and Thursday   etravirine 100 MG tablet Commonly known as:  INTELENCE Take 200 mg by mouth every 12 (twelve) hours.   ibuprofen 800 MG tablet Commonly known as:  ADVIL,MOTRIN Take 1 tablet (800 mg total) by mouth 3 (three) times daily.   metoprolol succinate 50 MG 24 hr tablet Commonly known as:  TOPROL-XL Take 50 mg by mouth daily.   oxyCODONE 5 MG immediate release tablet Commonly known as:  Oxy IR/ROXICODONE Take 5 mg by mouth daily as needed for moderate pain.   predniSONE 20 MG tablet Commonly known as:  DELTASONE Take 3 tablets (60 mg total) by mouth daily with breakfast.   raltegravir 400 MG tablet Commonly known as:  ISENTRESS Take 400 mg by mouth 2 (two) times daily.   sulfamethoxazole-trimethoprim 400-80 MG tablet Commonly known as:  BACTRIM,SEPTRA Take 1 tablet by mouth every Tuesday, Thursday, and Saturday at 6 PM. Start taking on:  10/23/2017   tenofovir 300 MG tablet Commonly known as:  VIREAD Take 300 mg by mouth once a week. To be taken on Tuesday after dialysis       Allergies  Allergen Reactions  . Bee Venom Anaphylaxis  . Other Anaphylaxis    mushrooms  . Aspirin Other (See Comments)    Reaction is unknown    Consultations:  renal   Procedures/Studies: Dg Chest 2 View  Result Date: 10/20/2017 CLINICAL DATA:  Cough, fever, and wheezing.  History of HIV. EXAM: CHEST  2 VIEW COMPARISON:  07/17/2016 FINDINGS: Cardiomegaly, progressed from prior exam. Mild vascular congestion. Central peribronchial thickening. No confluent airspace disease. Minimal fluid in the fissures without subpulmonic effusion. No pneumothorax. No acute osseous abnormalities. IMPRESSION: Progressive cardiomegaly with vascular congestion. Peribronchial  thickening may be pulmonary edema or bronchial inflammation. Minimal fluid in the fissures. Findings can be seen with volume overload. Electronically Signed   By: Rubye Oaks M.D.   On: 10/20/2017 03:00        Discharge Exam: Vitals:   10/21/17 0215 10/21/17 0455  BP:  115/79  Pulse:  (!) 112  Resp:  18  Temp:  98.3 F (36.8 C)  SpO2: 97% 95%   Vitals:   10/20/17 2245 10/20/17 2326 10/21/17 0215 10/21/17 0455  BP: 108/64 115/83  115/79  Pulse: (!) 111 (!) 117  (!) 112  Resp:  18  18  Temp:  97.8 F (36.6 C)  98.3 F (36.8 C)  TempSrc:  Oral  Oral  SpO2:  100% 97% 95%  Weight:  61.5 kg (135 lb 9.3 oz)    Height:        General: Pt is alert, awake, not in acute distress Cardiovascular: RRR, S1/S2 +, no rubs, no gallops Respiratory: bibasilar rales.  No wheeze Abdominal: Soft, NT, ND, bowel sounds + Extremities: trace LE edema, no cyanosis   The results of significant diagnostics from this hospitalization (including imaging, microbiology, ancillary and laboratory) are listed below for reference.    Significant Diagnostic Studies: Dg Chest 2 View  Result Date: 10/20/2017 CLINICAL DATA:  Cough, fever, and wheezing.  History of HIV. EXAM: CHEST  2 VIEW COMPARISON:  07/17/2016 FINDINGS: Cardiomegaly, progressed from prior exam. Mild vascular congestion. Central peribronchial thickening. No confluent airspace disease. Minimal fluid in the fissures without subpulmonic effusion. No pneumothorax. No acute osseous abnormalities. IMPRESSION: Progressive cardiomegaly with vascular  congestion. Peribronchial thickening may be pulmonary edema or bronchial inflammation. Minimal fluid in the fissures. Findings can be seen with volume overload. Electronically Signed   By: Rubye Oaks M.D.   On: 10/20/2017 03:00     Microbiology: Recent Results (from the past 240 hour(s))  Rapid strep screen     Status: None   Collection Time: 10/20/17  1:36 AM  Result Value Ref Range Status    Streptococcus, Group A Screen (Direct) NEGATIVE NEGATIVE Final    Comment: (NOTE) A Rapid Antigen test may result negative if the antigen level in the sample is below the detection level of this test. The FDA has not cleared this test as a stand-alone test therefore the rapid antigen negative result has reflexed to a Group A Strep culture.   Culture, blood (routine x 2)     Status: None (Preliminary result)   Collection Time: 10/20/17  2:33 AM  Result Value Ref Range Status   Specimen Description BLOOD RIGHT FOREARM  Final   Special Requests   Final    BOTTLES DRAWN AEROBIC AND ANAEROBIC Blood Culture adequate volume   Culture NO GROWTH 1 DAY  Final   Report Status PENDING  Incomplete  Culture, blood (routine x 2)     Status: None (Preliminary result)   Collection Time: 10/20/17  2:47 AM  Result Value Ref Range Status   Specimen Description BLOOD RIGHT FOREARM  Final   Special Requests   Final    BOTTLES DRAWN AEROBIC ONLY Blood Culture adequate volume   Culture NO GROWTH 1 DAY  Final   Report Status PENDING  Incomplete     Labs: Basic Metabolic Panel: Recent Labs  Lab 10/20/17 0231  NA 142  K 3.4*  CL 101  CO2 25  GLUCOSE 94  BUN 49*  CREATININE 9.17*  CALCIUM 8.1*   Liver Function Tests: Recent Labs  Lab 10/20/17 0231  AST 37  ALT 19  ALKPHOS 227*  BILITOT 1.7*  PROT 9.0*  ALBUMIN 3.5   No results for input(s): LIPASE, AMYLASE in the last 168 hours. No results for input(s): AMMONIA in the last 168 hours. CBC: Recent Labs  Lab 10/20/17 0231  WBC 6.0  NEUTROABS 4.9  HGB 10.2*  HCT 31.6*  MCV 109.0*  PLT 133*   Cardiac Enzymes: Recent Labs  Lab 10/20/17 1107 10/20/17 1656  TROPONINI 0.05* 0.05*   BNP: Invalid input(s): POCBNP CBG: Recent Labs  Lab 10/20/17 2343  GLUCAP 142*    Time coordinating discharge:  Greater than 30 minutes  Signed:  Catarina Hartshorn, DO [facility] Pager: [phone] 10/21/2017, 7:13 AM

## 2017-10-21 NOTE — Progress Notes (Signed)
Removed IV-clean, dry, intact. Reviewed d/c paperwork with patient and discussed new medications.  Walked stable pt to elevator. His uncle was at the main entrance waiting to pick him up

## 2017-10-22 LAB — T-HELPER CELLS (CD4) COUNT (NOT AT ARMC)
CD4 T CELL ABS: 10 /uL — AB (ref 400–2700)
CD4 T CELL HELPER: 6 % — AB (ref 33–55)

## 2017-10-22 LAB — CULTURE, GROUP A STREP (THRC)

## 2017-10-23 LAB — HEPATITIS B SURFACE ANTIGEN: Hepatitis B Surface Ag: NEGATIVE

## 2017-10-25 LAB — CULTURE, BLOOD (ROUTINE X 2)
CULTURE: NO GROWTH
CULTURE: NO GROWTH
SPECIAL REQUESTS: ADEQUATE
SPECIAL REQUESTS: ADEQUATE

## 2017-11-17 ENCOUNTER — Encounter (HOSPITAL_COMMUNITY): Payer: Self-pay | Admitting: Emergency Medicine

## 2017-11-17 ENCOUNTER — Emergency Department (HOSPITAL_COMMUNITY): Payer: Medicare Other

## 2017-11-17 ENCOUNTER — Other Ambulatory Visit: Payer: Self-pay

## 2017-11-17 ENCOUNTER — Inpatient Hospital Stay (HOSPITAL_COMMUNITY)
Admission: EM | Admit: 2017-11-17 | Discharge: 2017-11-19 | DRG: 291 | Disposition: A | Discharge status: Home / Self Care | Payer: Medicare Other | Attending: Family Medicine | Admitting: Family Medicine

## 2017-11-17 DIAGNOSIS — J4521 Mild intermittent asthma with (acute) exacerbation: Secondary | ICD-10-CM

## 2017-11-17 DIAGNOSIS — N186 End stage renal disease: Secondary | ICD-10-CM | POA: Diagnosis present

## 2017-11-17 DIAGNOSIS — I255 Ischemic cardiomyopathy: Secondary | ICD-10-CM | POA: Diagnosis present

## 2017-11-17 DIAGNOSIS — D631 Anemia in chronic kidney disease: Secondary | ICD-10-CM | POA: Diagnosis present

## 2017-11-17 DIAGNOSIS — I5023 Acute on chronic systolic (congestive) heart failure: Secondary | ICD-10-CM | POA: Diagnosis present

## 2017-11-17 DIAGNOSIS — Z21 Asymptomatic human immunodeficiency virus [HIV] infection status: Secondary | ICD-10-CM | POA: Diagnosis present

## 2017-11-17 DIAGNOSIS — I428 Other cardiomyopathies: Secondary | ICD-10-CM

## 2017-11-17 DIAGNOSIS — I340 Nonrheumatic mitral (valve) insufficiency: Secondary | ICD-10-CM | POA: Diagnosis not present

## 2017-11-17 DIAGNOSIS — I132 Hypertensive heart and chronic kidney disease with heart failure and with stage 5 chronic kidney disease, or end stage renal disease: Secondary | ICD-10-CM | POA: Diagnosis present

## 2017-11-17 DIAGNOSIS — Z886 Allergy status to analgesic agent status: Secondary | ICD-10-CM

## 2017-11-17 DIAGNOSIS — Z992 Dependence on renal dialysis: Secondary | ICD-10-CM

## 2017-11-17 DIAGNOSIS — I252 Old myocardial infarction: Secondary | ICD-10-CM

## 2017-11-17 DIAGNOSIS — N185 Chronic kidney disease, stage 5: Secondary | ICD-10-CM | POA: Diagnosis present

## 2017-11-17 DIAGNOSIS — I509 Heart failure, unspecified: Secondary | ICD-10-CM | POA: Diagnosis not present

## 2017-11-17 DIAGNOSIS — F17210 Nicotine dependence, cigarettes, uncomplicated: Secondary | ICD-10-CM | POA: Diagnosis present

## 2017-11-17 DIAGNOSIS — I501 Left ventricular failure: Secondary | ICD-10-CM | POA: Diagnosis not present

## 2017-11-17 DIAGNOSIS — B20 Human immunodeficiency virus [HIV] disease: Secondary | ICD-10-CM

## 2017-11-17 DIAGNOSIS — E8779 Other fluid overload: Secondary | ICD-10-CM

## 2017-11-17 DIAGNOSIS — J45901 Unspecified asthma with (acute) exacerbation: Secondary | ICD-10-CM | POA: Diagnosis present

## 2017-11-17 DIAGNOSIS — J441 Chronic obstructive pulmonary disease with (acute) exacerbation: Secondary | ICD-10-CM | POA: Diagnosis present

## 2017-11-17 DIAGNOSIS — Z91030 Bee allergy status: Secondary | ICD-10-CM | POA: Diagnosis not present

## 2017-11-17 LAB — CBC
HCT: 27.6 % — ABNORMAL LOW (ref 39.0–52.0)
HEMOGLOBIN: 8.7 g/dL — AB (ref 13.0–17.0)
MCH: 34.1 pg — AB (ref 26.0–34.0)
MCHC: 31.5 g/dL (ref 30.0–36.0)
MCV: 108.2 fL — ABNORMAL HIGH (ref 78.0–100.0)
Platelets: 184 10*3/uL (ref 150–400)
RBC: 2.55 MIL/uL — ABNORMAL LOW (ref 4.22–5.81)
RDW: 18.4 % — ABNORMAL HIGH (ref 11.5–15.5)
WBC: 4.3 10*3/uL (ref 4.0–10.5)

## 2017-11-17 LAB — BASIC METABOLIC PANEL
Anion gap: 17 — ABNORMAL HIGH (ref 5–15)
BUN: 70 mg/dL — AB (ref 6–20)
CHLORIDE: 98 mmol/L — AB (ref 101–111)
CO2: 23 mmol/L (ref 22–32)
CREATININE: 13.77 mg/dL — AB (ref 0.61–1.24)
Calcium: 8.2 mg/dL — ABNORMAL LOW (ref 8.9–10.3)
GFR calc Af Amer: 5 mL/min — ABNORMAL LOW (ref >60)
GFR calc non Af Amer: 4 mL/min — ABNORMAL LOW (ref >60)
Glucose, Bld: 104 mg/dL — ABNORMAL HIGH (ref 65–99)
Potassium: 5.2 mmol/L — ABNORMAL HIGH (ref 3.5–5.1)
SODIUM: 138 mmol/L (ref 135–145)

## 2017-11-17 MED ORDER — RALTEGRAVIR POTASSIUM 400 MG PO TABS
400.0000 mg | ORAL_TABLET | Freq: Two times a day (BID) | ORAL | Status: DC
  Administered 2017-11-17 – 2017-11-19 (×4): 400 mg via ORAL
  Filled 2017-11-17 (×8): qty 1

## 2017-11-17 MED ORDER — METHYLPREDNISOLONE SODIUM SUCC 125 MG IJ SOLR
125.0000 mg | Freq: Once | INTRAMUSCULAR | Status: AC
  Administered 2017-11-17: 125 mg via INTRAVENOUS
  Filled 2017-11-17: qty 2

## 2017-11-17 MED ORDER — LIDOCAINE-PRILOCAINE 2.5-2.5 % EX CREA: 1.0000 "application " | TOPICAL_CREAM | CUTANEOUS | Status: DC | PRN

## 2017-11-17 MED ORDER — ONDANSETRON HCL 4 MG/2ML IJ SOLN: 4.0000 mg | Freq: Four times a day (QID) | INTRAMUSCULAR | Status: DC | PRN

## 2017-11-17 MED ORDER — SODIUM CHLORIDE 0.9% FLUSH: 3.0000 mL | INTRAVENOUS | Status: DC | PRN

## 2017-11-17 MED ORDER — IBUPROFEN 800 MG PO TABS: 800.0000 mg | ORAL_TABLET | Freq: Three times a day (TID) | ORAL | Status: DC

## 2017-11-17 MED ORDER — EMTRICITABINE 200 MG PO CAPS
200.0000 mg | ORAL_CAPSULE | ORAL | Status: DC
  Administered 2017-11-19: 200 mg via ORAL
  Filled 2017-11-17: qty 1

## 2017-11-17 MED ORDER — PENTAFLUOROPROP-TETRAFLUOROETH EX AERO: 1.0000 "application " | INHALATION_SPRAY | CUTANEOUS | Status: DC | PRN

## 2017-11-17 MED ORDER — LIDOCAINE HCL (PF) 1 % IJ SOLN: 5.0000 mL | INTRAMUSCULAR | Status: DC | PRN

## 2017-11-17 MED ORDER — SODIUM CHLORIDE 0.9 % IV SOLN: 250.0000 mL | INTRAVENOUS | Status: DC | PRN

## 2017-11-17 MED ORDER — METHYLPREDNISOLONE SODIUM SUCC 125 MG IJ SOLR
60.0000 mg | Freq: Two times a day (BID) | INTRAMUSCULAR | Status: DC
  Administered 2017-11-18: 60 mg via INTRAVENOUS
  Filled 2017-11-17: qty 2

## 2017-11-17 MED ORDER — DIPHENHYDRAMINE HCL 50 MG/ML IJ SOLN
25.0000 mg | Freq: Once | INTRAMUSCULAR | Status: AC
  Administered 2017-11-17: 25 mg via INTRAVENOUS
  Filled 2017-11-17: qty 1

## 2017-11-17 MED ORDER — ETRAVIRINE 100 MG PO TABS
200.0000 mg | ORAL_TABLET | Freq: Two times a day (BID) | ORAL | Status: DC
  Administered 2017-11-18 – 2017-11-19 (×3): 200 mg via ORAL
  Filled 2017-11-17 (×8): qty 2

## 2017-11-17 MED ORDER — OXYCODONE HCL 5 MG PO TABS
5.0000 mg | ORAL_TABLET | Freq: Four times a day (QID) | ORAL | Status: DC | PRN
  Administered 2017-11-18 (×4): 5 mg via ORAL
  Filled 2017-11-17 (×4): qty 1

## 2017-11-17 MED ORDER — ACETAMINOPHEN 325 MG PO TABS: 650.0000 mg | ORAL_TABLET | ORAL | Status: DC | PRN

## 2017-11-17 MED ORDER — TENOFOVIR DISOPROXIL FUMARATE 300 MG PO TABS
300.0000 mg | ORAL_TABLET | ORAL | Status: DC
  Filled 2017-11-17: qty 1

## 2017-11-17 MED ORDER — SODIUM CHLORIDE 0.9 % IV SOLN: 100.0000 mL | INTRAVENOUS | Status: DC | PRN

## 2017-11-17 MED ORDER — HEPARIN SODIUM (PORCINE) 5000 UNIT/ML IJ SOLN
5000.0000 [IU] | Freq: Three times a day (TID) | INTRAMUSCULAR | Status: DC
  Filled 2017-11-17 (×2): qty 1

## 2017-11-17 MED ORDER — SODIUM CHLORIDE 0.9% FLUSH
3.0000 mL | Freq: Two times a day (BID) | INTRAVENOUS | Status: DC
  Administered 2017-11-17 – 2017-11-18 (×3): 3 mL via INTRAVENOUS

## 2017-11-17 MED ORDER — SULFAMETHOXAZOLE-TRIMETHOPRIM 400-80 MG PO TABS
1.0000 | ORAL_TABLET | ORAL | Status: DC
  Filled 2017-11-17: qty 1

## 2017-11-17 MED ORDER — METOPROLOL SUCCINATE ER 50 MG PO TB24
50.0000 mg | ORAL_TABLET | Freq: Every day | ORAL | Status: DC
  Administered 2017-11-18 – 2017-11-19 (×2): 50 mg via ORAL
  Filled 2017-11-17 (×2): qty 1

## 2017-11-17 MED ORDER — IPRATROPIUM-ALBUTEROL 0.5-2.5 (3) MG/3ML IN SOLN
3.0000 mL | Freq: Four times a day (QID) | RESPIRATORY_TRACT | Status: DC
  Administered 2017-11-17 – 2017-11-18 (×3): 3 mL via RESPIRATORY_TRACT
  Filled 2017-11-17 (×4): qty 3

## 2017-11-17 NOTE — H&P (Signed)
History and Physical  Dave Estrada LKG:[id number] DOB: 1990-12-26 DOA: 11/17/2017  Referring physician: Dr Lynelle Doctor, ED physician PCP: Patient, No Pcp Per  Outpatient Specialists:  Fausto Skillern (Nephrology)   Patient Coming From: home  Chief Complaint: SOB  HPI: Dave Estrada is a 27 y.o. male with a history of HIV, end-stage renal disease on dialysis Tuesday Thursday Saturday, asthma, ischemic cardiomyopathy with systolic heart failure with EF of 15-20% on 01/2016.  Patient seen due to increasing shortness of breath that started 3-4 days ago, worse with exertion and improved with rest.  Symptoms also worse with lying down patient went to dialysis on Thursday and only had 2 L taken off due to "problems with his fistula".  Patient was having increasing shortness of breath today and was supposed to go to dialysis, however he opted to come here for evaluation instead hoping to have more fluid taken off.  No other palliating or provoking factors.  Emergency Department Course: Potassium elevated to 5.2, creatinine 13.77, chest x-ray shows pulmonary edema  Review of Systems:   Pt denies any fevers, chills, nausea, vomiting, diarrhea, constipation, abdominal pain, palpitations, headache, vision changes, lightheadedness, dizziness, melena, rectal bleeding.  Review of systems are otherwise negative  Past Medical History:  Diagnosis Date  . 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   . Asthma   . ESRD (end stage renal disease) (HCC)   . Heart attack (HCC)   . HIV (human immunodeficiency virus infection) (HCC)   . Staphylococcus aureus bacteremia with sepsis (HCC) 09/09/2014  . Systolic dysfunction, left ventricle 12/20/2014   EF 20%    Past Surgical History:  Procedure Laterality Date  . INSERTION OF DIALYSIS CATHETER Left 09/15/2014   Procedure: INSERTION OF DIALYSIS CATHETER;  Surgeon: Nada Libman, MD;  Location: [facility] OR;  Service: Vascular;  Laterality: Left;    . PORT A CATH REVISION    . TEE WITHOUT CARDIOVERSION N/A 09/14/2014   Procedure: TRANSESOPHAGEAL ECHOCARDIOGRAM (TEE);  Surgeon: Laurey Morale, MD;  Location: [facility] ENDOSCOPY;  Service: Cardiovascular;  Laterality: N/A;  will be at [facility] by mon   Social History:  reports that he has been smoking cigarettes.  He started smoking about 14 years ago. He has a 5.50 pack-year smoking history. he has never used smokeless tobacco. He reports that he uses drugs. Drug: Marijuana. He reports that he does not drink alcohol. Patient lives at home  Allergies  Allergen Reactions  . Bee Venom Anaphylaxis  . Other Anaphylaxis    mushrooms  . Aspirin Other (See Comments)    Reaction is unknown    Family History  Problem Relation Age of Onset  . Seizures Father       Prior to Admission medications   Medication Sig Start Date End Date Taking? Authorizing Provider  albuterol (PROVENTIL HFA;VENTOLIN HFA) 108 (90 BASE) MCG/ACT inhaler Inhale 1-2 puffs into the lungs every 6 (six) hours as needed for wheezing or shortness of breath.    [provider]  emtricitabine (EMTRIVA) 200 MG capsule Take 200 mg by mouth 2 (two) times a week. Taken after dialysis on Tuesday and Thursday    [provider]  etravirine (INTELENCE) 100 MG tablet Take 200 mg by mouth every 12 (twelve) hours.    [provider]  ibuprofen (ADVIL,MOTRIN) 800 MG tablet Take 1 tablet (800 mg total) by mouth 3 (three) times daily. 06/11/17   Eber Hong, MD  metoprolol succinate (  TOPROL-XL) 50 MG 24 hr tablet Take 50 mg by mouth daily.     [provider]  oxyCODONE (OXY IR/ROXICODONE) 5 MG immediate release tablet Take 5 mg by mouth daily as needed for moderate pain.  04/23/17   [provider]  predniSONE (DELTASONE) 20 MG tablet Take 3 tablets (60 mg total) by mouth daily with breakfast. 10/21/17   Tat, Onalee Hua, MD  raltegravir (ISENTRESS) 400 MG tablet Take 400 mg by mouth 2 (two) times daily.     [provider]  sulfamethoxazole-trimethoprim (BACTRIM,SEPTRA) 400-80 MG tablet Take 1 tablet by mouth every Tuesday, Thursday, and Saturday at 6 PM. 10/23/17   Tat, Onalee Hua, MD  tenofovir (VIREAD) 300 MG tablet Take 300 mg by mouth once a week. To be taken on Tuesday after dialysis    [provider]    Physical Exam: BP 102/70   Pulse (!) 106   Temp 97.7 F (36.5 C) (Oral)   Resp 14   Ht 5\' 5"  (1.651 m)   Wt 61.2 kg (135 lb)   SpO2 97%   BMI 22.47 kg/m   General: Young black male who appears older than stated age. Awake and alert and oriented x3. No acute cardiopulmonary distress.  HEENT: Normocephalic atraumatic.  Right and left ears normal in appearance.  Pupils equal, round, reactive to light. Extraocular muscles are intact. Sclerae anicteric and noninjected.  Moist mucosal membranes. No mucosal lesions.  Neck: Neck supple without lymphadenopathy. No carotid bruits. No masses palpated.  Cardiovascular: Regular rate with normal S1-S2 sounds. No murmurs, rubs, gallops auscultated.  Increased JVD.  2-3+ pitting edema in lower extremities to the thighs. Respiratory: Diminished breath sounds, coarse breathing, rales in bases bilaterally with coarse wheezing. No accessory muscle use. Abdomen: Soft, mildly tender throughout.  Distended. Active bowel sounds. No masses or hepatosplenomegaly  Skin: No rashes, lesions, or ulcerations.  Dry, warm to touch. 2+ dorsalis pedis and radial pulses. Musculoskeletal: No calf or leg pain. All major joints not erythematous nontender.  No upper or lower joint deformation.  Good ROM.  No contractures  Psychiatric: Intact judgment and insight. Pleasant and cooperative. Neurologic: No focal neurological deficits. Strength is 5/5 and symmetric in upper and lower extremities.  Cranial nerves II through XII are grossly intact.           Labs on Admission: I have personally reviewed following labs and imaging studies  CBC: Recent Labs  Lab  11/17/17 1416  WBC 4.3  HGB 8.7*  HCT 27.6*  MCV 108.2*  PLT 184   Basic Metabolic Panel: Recent Labs  Lab 11/17/17 1416  NA 138  K 5.2*  CL 98*  CO2 23  GLUCOSE 104*  BUN 70*  CREATININE 13.77*  CALCIUM 8.2*   GFR: Estimated Creatinine Clearance: 6.9 mL/min (A) (by C-G formula based on SCr of 13.77 mg/dL (H)). Liver Function Tests: No results for input(s): AST, ALT, ALKPHOS, BILITOT, PROT, ALBUMIN in the last 168 hours. No results for input(s): LIPASE, AMYLASE in the last 168 hours. No results for input(s): AMMONIA in the last 168 hours. Coagulation Profile: No results for input(s): INR, PROTIME in the last 168 hours. Cardiac Enzymes: No results for input(s): CKTOTAL, CKMB, CKMBINDEX, TROPONINI in the last 168 hours. BNP (last 3 results) No results for input(s): PROBNP in the last 8760 hours. HbA1C: No results for input(s): HGBA1C in the last 72 hours. CBG: No results for input(s): GLUCAP in the last 168 hours. Lipid Profile: No results for input(s):  CHOL, HDL, LDLCALC, TRIG, CHOLHDL, LDLDIRECT in the last 72 hours. Thyroid Function Tests: No results for input(s): TSH, T4TOTAL, FREET4, T3FREE, THYROIDAB in the last 72 hours. Anemia Panel: No results for input(s): VITAMINB12, FOLATE, FERRITIN, TIBC, IRON, RETICCTPCT in the last 72 hours. Urine analysis:    Component Value Date/Time   COLORURINE AMBER (A) 07/18/2016 0123   APPEARANCEUR HAZY (A) 07/18/2016 0123   LABSPEC 1.015 07/18/2016 0123   PHURINE 7.5 07/18/2016 0123   GLUCOSEU 100 (A) 07/18/2016 0123   HGBUR MODERATE (A) 07/18/2016 0123   BILIRUBINUR MODERATE (A) 07/18/2016 0123   KETONESUR NEGATIVE 07/18/2016 0123   PROTEINUR >300 (A) 07/18/2016 0123   UROBILINOGEN 0.2 12/17/2014 0017   NITRITE NEGATIVE 07/18/2016 0123   LEUKOCYTESUR SMALL (A) 07/18/2016 0123   Sepsis Labs: @LABRCNTIP (procalcitonin:4,lacticidven:4) )No results found for this or any previous visit (from the past 240 hour(s)).    Radiological Exams on Admission: Dg Chest 2 View  Result Date: 11/17/2017 CLINICAL DATA:  Chest pain and shortness of breath radiating to the right since yesterday. EXAM: CHEST  2 VIEW COMPARISON:  10/20/2017.  Abdomen and pelvis CT dated 01/24/2017. FINDINGS: Stable enlarged cardiac silhouette with a globular configuration. Mild prominence of the pulmonary vasculature and mild diffuse peribronchial thickening are stable. No pleural fluid. Unremarkable bones. IMPRESSION: 1. Stable cardiomegaly with a globular configuration. This appearance can be seen with a pericardial effusion. The patient had a pericardial effusion on 01/24/2017. 2. Stable mild pulmonary vascular congestion and mild chronic bronchitic changes. Electronically Signed   By: Beckie Salts M.D.   On: 11/17/2017 14:27    EKG: Independently reviewed.  Sinus tachycardia, left atrial enlargement, old anterior septal infarct  Assessment/Plan: Principal Problem:   Acute on chronic systolic CHF (congestive heart failure) (HCC) Active Problems:   HIV (human immunodeficiency virus infection) (HCC)   ESRD on hemodialysis (HCC)   Nonischemic cardiomyopathy (HCC)   Acute asthma exacerbation    This patient was discussed with the ED physician, including pertinent vitals, physical exam findings, labs, and imaging.  We also discussed care given by the ED provider.  1. Acute on chronic systolic heart failure Telemetry monitoring Strict I/O Daily Weights Diuresis: Dialysis Echo cardiac exam tomorrow Repeat BMP tomorrow 2. End-stage renal disease on hemodialysis 1. Nephrology G consulted for dialysis 3. Nonischemic cardiomyopathy 1. Echo tomorrow 4. HIV 1. Continue home medications 5. Acute asthma exacerbation DuoNeb's every 6 scheduled with albuterol every 2 when necessary Solu-Medrol 60 mg IV every 12 hours  DVT prophylaxis: Heparin Consultants: Nephrology Code Status: Full code Family Communication: None Disposition Plan:  Patient to return home following admission   Noralee Chars [facility] Pager [phone]  If 7PM-7AM, please contact night-coverage [email or web address] Password [email or web address]

## 2017-11-17 NOTE — ED Provider Notes (Signed)
[facility] EMERGENCY DEPARTMENT Provider Note   CSN: [id number] Arrival date & time: 11/17/17  1250     History   Chief Complaint Chief Complaint  Patient presents with  . Shortness of Breath    HPI Dave Estrada is a 27 y.o. male.  HPI Patient presents to the emergency room for evaluation of shortness of breath.  Patient has a history of end-stage renal disease.  He is on dialysis.  He gets dialyzed Tuesday Thursday Saturday.  Patient states he last went to dialysis on Thursday.  There was some issues with his fistula so they were not able to pull off the normal amount of fluid.  Patient has had increasing swelling in his legs and in his abdomen.  Patient feels like he needs to have dialysis.  Patient was scheduled to go to his outpatient treatment center today however he says that normally they can only take off 2 L of fluid.  He felt like he was going to need more than that taken off so he decided to come to the hospital instead of his dialysis center. Past Medical History:  Diagnosis Date  . A-V fistula (HCC)    Placed on 10/27/2014 at [facility]; for future HD use.   . Asthma   . ESRD (end stage renal disease) (HCC)   . Heart attack (HCC)   . HIV (human immunodeficiency virus infection) (HCC)   . Staphylococcus aureus bacteremia with sepsis (HCC) 09/09/2014  . Systolic dysfunction, left ventricle 12/20/2014   EF 20%     Patient Active Problem List   Diagnosis Date Noted  . Acute asthma exacerbation 10/20/2017  . Pancreatitis 01/24/2017  . Enteritis 01/24/2017  . Inguinal adenopathy 07/18/2016  . AIDS (acquired immune deficiency syndrome) (HCC)   . Hyperbilirubinemia   . ESRD needing dialysis (HCC) 06/11/2016  . Chronic systolic heart failure (HCC) 01/13/2016  . Nonischemic cardiomyopathy (HCC) 11/27/2015  . Hemoptysis 09/11/2015  . Cough 09/11/2015  . Elevated troponin 09/11/2015  . Protein-calorie malnutrition, severe (HCC) 09/11/2015  . Pneumonia 09/11/2015  . A-V  fistula (HCC) 12/18/2014  . Thrombocytopenia, acquired (HCC) 12/18/2014  . Other infectious complication of vascular dialysis catheter 12/17/2014  . 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 Pyrexia   . Vomiting and diarrhea   . Macrocytic anemia 09/10/2014  . Cigarette smoker 09/10/2014  . Liver lesion 09/10/2014  . Asthma, chronic 09/10/2014  . History of MRSA infection 09/10/2014  . MRSA bacteremia 09/10/2014  . Staphylococcus aureus bacteremia with sepsis (HCC) 09/09/2014  . HIV (human immunodeficiency virus infection) (HCC) 09/08/2014  . ESRD on hemodialysis (HCC) 09/08/2014    Past Surgical History:  Procedure Laterality Date  . INSERTION OF DIALYSIS CATHETER Left 09/15/2014   Procedure: INSERTION OF DIALYSIS CATHETER;  Surgeon: Nada Libman, MD;  Location: [facility] OR;  Service: Vascular;  Laterality: Left;  . PORT A CATH REVISION    . TEE WITHOUT CARDIOVERSION N/A 09/14/2014   Procedure: TRANSESOPHAGEAL ECHOCARDIOGRAM (TEE);  Surgeon: Laurey Morale, MD;  Location: [facility] ENDOSCOPY;  Service: Cardiovascular;  Laterality: N/A;  will be at [facility] by mon       Home Medications    Prior to Admission medications   Medication Sig Start Date End Date Taking? Authorizing Provider  albuterol (PROVENTIL HFA;VENTOLIN HFA) 108 (90 BASE) MCG/ACT inhaler Inhale 1-2 puffs into the lungs every 6 (six) hours as needed for wheezing or shortness of breath.    [provider]  emtricitabine (  EMTRIVA) 200 MG capsule Take 200 mg by mouth 2 (two) times a week. Taken after dialysis on Tuesday and Thursday    [provider]  etravirine (INTELENCE) 100 MG tablet Take 200 mg by mouth every 12 (twelve) hours.    [provider]  ibuprofen (ADVIL,MOTRIN) 800 MG tablet Take 1 tablet (800 mg total) by mouth 3 (three) times daily. 06/11/17   Eber Hong, MD  metoprolol succinate (TOPROL-XL) 50 MG 24 hr tablet Take 50 mg by mouth daily.     [provider]  oxyCODONE (OXY IR/ROXICODONE) 5 MG immediate release tablet Take 5 mg by mouth daily as needed for moderate pain.  04/23/17   [provider]  predniSONE (DELTASONE) 20 MG tablet Take 3 tablets (60 mg total) by mouth daily with breakfast. 10/21/17   Tat, Onalee Hua, MD  raltegravir (ISENTRESS) 400 MG tablet Take 400 mg by mouth 2 (two) times daily.    [provider]  sulfamethoxazole-trimethoprim (BACTRIM,SEPTRA) 400-80 MG tablet Take 1 tablet by mouth every Tuesday, Thursday, and Saturday at 6 PM. 10/23/17   Tat, Onalee Hua, MD  tenofovir (VIREAD) 300 MG tablet Take 300 mg by mouth once a week. To be taken on Tuesday after dialysis    [provider]    Family History Family History  Problem Relation Age of Onset  . Seizures Father     Social History Social History   Tobacco Use  . Smoking status: Current Every Day Smoker    Packs/day: 0.50    Years: 11.00    Pack years: 5.50    Types: Cigarettes    Start date: 01/28/2003  . Smokeless tobacco: Never Used  Substance Use Topics  . Alcohol use: No    Alcohol/week: 0.0 oz  . Drug use: Yes    Types: Marijuana     Allergies   Bee venom; Other; and Aspirin   Review of Systems Review of Systems  All other systems reviewed and are negative.    Physical Exam Updated Vital Signs BP 102/90   Pulse (!) 115   Temp 97.7 F (36.5 C) (Oral)   Resp 18   Ht 1.651 m (5\' 5" )   Wt 61.2 kg (135 lb)   SpO2 97%   BMI 22.47 kg/m   Physical Exam  Constitutional: He appears well-developed and well-nourished. No distress.  HENT:  Head: Normocephalic and atraumatic.  Right Ear: External ear normal.  Left Ear: External ear normal.  Eyes: Conjunctivae are normal. Right eye exhibits no discharge. Left eye exhibits no discharge. No scleral icterus.  Neck: Neck supple. No tracheal deviation present.  Cardiovascular: Regular rhythm and intact distal pulses. Tachycardia present.  Pulmonary/Chest: Effort normal  and breath sounds normal. No stridor. No respiratory distress. He has no wheezes. He has no rales.  Abdominal: Soft. Bowel sounds are normal. He exhibits no distension. There is no tenderness. There is no rebound and no guarding.  Musculoskeletal: He exhibits no tenderness.       Right lower leg: He exhibits edema.       Left lower leg: He exhibits edema.  Neurological: He is alert. He has normal strength. No cranial nerve deficit (no facial droop, extraocular movements intact, no slurred speech) or sensory deficit. He exhibits normal muscle tone. He displays no seizure activity. Coordination normal.  Skin: Skin is warm and dry. Rash noted.  Multiple areas of dry skin and scaling  Psychiatric: He has a normal mood and affect.  Nursing note and  vitals reviewed.    ED Treatments / Results  Labs (all labs ordered are listed, but only abnormal results are displayed) Labs Reviewed  CBC - Abnormal; Notable for the following components:      Result Value   RBC 2.55 (*)    Hemoglobin 8.7 (*)    HCT 27.6 (*)    MCV 108.2 (*)    MCH 34.1 (*)    RDW 18.4 (*)    All other components within normal limits  BASIC METABOLIC PANEL - Abnormal; Notable for the following components:   Potassium 5.2 (*)    Chloride 98 (*)    Glucose, Bld 104 (*)    BUN 70 (*)    Creatinine, Ser 13.77 (*)    Calcium 8.2 (*)    GFR calc non Af Amer 4 (*)    GFR calc Af Amer 5 (*)    Anion gap 17 (*)    All other components within normal limits    EKG  EKG Interpretation  Date/Time:  Saturday November 17 2017 13:13:20 EST Ventricular Rate:  108 PR Interval:    QRS Duration: 83 QT Interval:  361 QTC Calculation: 484 R Axis:   106 Text Interpretation:  Sinus tachycardia LAE, consider biatrial enlargement Borderline right axis deviation Probable anteroseptal infarct, old No significant change since last tracing Confirmed by Linwood Dibbles [id number]) on 11/17/2017 1:58:41 PM       Radiology Dg Chest 2  View  Result Date: 11/17/2017 CLINICAL DATA:  Chest pain and shortness of breath radiating to the right since yesterday. EXAM: CHEST  2 VIEW COMPARISON:  10/20/2017.  Abdomen and pelvis CT dated 01/24/2017. FINDINGS: Stable enlarged cardiac silhouette with a globular configuration. Mild prominence of the pulmonary vasculature and mild diffuse peribronchial thickening are stable. No pleural fluid. Unremarkable bones. IMPRESSION: 1. Stable cardiomegaly with a globular configuration. This appearance can be seen with a pericardial effusion. The patient had a pericardial effusion on 01/24/2017. 2. Stable mild pulmonary vascular congestion and mild chronic bronchitic changes. Electronically Signed   By: Beckie Salts M.D.   On: 11/17/2017 14:27    Procedures Procedures (including critical care time)  Medications Ordered in ED Medications - No data to display   Initial Impression / Assessment and Plan / ED Course  I have reviewed the triage vital signs and the nursing notes.  Pertinent labs & imaging results that were available during my care of the patient were reviewed by me and considered in my medical decision making (see chart for details).  Clinical Course as of Nov 17 1550  Sat Nov 17, 2017  1551 I discussed the case with Dr. Kristian Covey.  He agrees to take the patient for dialysis.  I will consult with the medical service for admission  [JK]    Clinical Course User Index [JK] Linwood Dibbles, MD    Patient presents to the emergency room with complaints of volume overload.  Patient did not feel well and felt like he could not go to his dialysis center because they would not take enough fluid off.  I spoke with Dr. Kristian Covey.  He will bring the patient in for dialysis.  I will consult the medical service for admission.  Final Clinical Impressions(s) / ED Diagnoses   Final diagnoses:  Chronic renal failure, stage 5 (HCC)  Other hypervolemia      Linwood Dibbles, MD 11/17/17 1553

## 2017-11-17 NOTE — Consult Note (Signed)
Reason for Consult: End-stage renal disease and fluid overload Referring Physician: Dr. Deirdre Priest is an 27 y.o. male.  HPI: He is a patient who has history of HIV, end-stage renal disease on maintenance hemodialysis, ischemic cardiomyopathy presently came with complaints of difficulty breathing for the last 3 to 4 days and also some exertional dyspnea which seems to be getting worse.  Patient states that he was dialyzed on Thursday but he did not complete his treatment because of fistula problem.  Presently he was supposed to go to dialysis unit but decided to come to emergency room to be evaluated.  Presently admitted for fluid overload.  Patient denies any cough, fever or chills.  Past Medical History:  Diagnosis Date  . A-V fistula ([place])    Placed on 10/27/2014 at [facility]; for future HD use.   . Asthma   . ESRD (end stage renal disease) ([place])   . Heart attack ([place])   . HIV (human immunodeficiency virus infection) ([place])   . Staphylococcus aureus bacteremia with sepsis ([place]) 09/09/2014  . Systolic dysfunction, left ventricle 12/20/2014   EF 20%     Past Surgical History:  Procedure Laterality Date  . INSERTION OF DIALYSIS CATHETER Left 09/15/2014   Procedure: INSERTION OF DIALYSIS CATHETER;  Surgeon: Serafina Mitchell, MD;  Location: [place];  Service: Vascular;  Laterality: Left;  . PORT A CATH REVISION    . TEE WITHOUT CARDIOVERSION N/A 09/14/2014   Procedure: TRANSESOPHAGEAL ECHOCARDIOGRAM (TEE);  Surgeon: Larey Dresser, MD;  Location: [facility] ENDOSCOPY;  Service: Cardiovascular;  Laterality: N/A;  will be at [facility] by mon    Family History  Problem Relation Age of Onset  . Seizures Father     Social History:  reports that he has been smoking cigarettes.  He started smoking about 14 years ago. He has a 5.50 pack-year smoking history. he has never used smokeless tobacco. He reports that he uses drugs. Drug: Marijuana. He reports that he does not drink alcohol.  Allergies:   Allergies  Allergen Reactions  . Bee Venom Anaphylaxis  . Other Anaphylaxis    mushrooms  . Aspirin Other (See Comments)    Reaction is unknown    Medications: I have reviewed the patient's current medications.  Results for orders placed or performed during the hospital encounter of 11/17/17 (from the past 48 hour(s))  CBC     Status: Abnormal   Collection Time: 11/17/17  2:16 PM  Result Value Ref Range   WBC 4.3 4.0 - 10.5 K/uL   RBC 2.55 (L) 4.22 - 5.81 MIL/uL   Hemoglobin 8.7 (L) 13.0 - 17.0 g/dL   HCT 27.6 (L) 39.0 - 52.0 %   MCV 108.2 (H) 78.0 - 100.0 fL   MCH 34.1 (H) 26.0 - 34.0 pg   MCHC 31.5 30.0 - 36.0 g/dL   RDW 18.4 (H) 11.5 - 15.5 %   Platelets 184 150 - 400 K/uL    Comment: Performed at [facility], [address], [place], [place] [address]  Basic metabolic panel     Status: Abnormal   Collection Time: 11/17/17  2:16 PM  Result Value Ref Range   Sodium 138 135 - 145 mmol/L   Potassium 5.2 (H) 3.5 - 5.1 mmol/L   Chloride 98 (L) 101 - 111 mmol/L   CO2 23 22 - 32 mmol/L   Glucose, Bld 104 (H) 65 - 99 mg/dL   BUN 70 (H) 6 - 20 mg/dL   Creatinine,  Ser 13.77 (H) 0.61 - 1.24 mg/dL   Calcium 8.2 (L) 8.9 - 10.3 mg/dL   GFR calc non Af Amer 4 (L) >60 mL/min   GFR calc Af Amer 5 (L) >60 mL/min    Comment: (NOTE) The eGFR has been calculated using the CKD EPI equation. This calculation has not been validated in all clinical situations. eGFR's persistently <60 mL/min signify possible Chronic Kidney Disease.    Anion gap 17 (H) 5 - 15    Comment: Performed at [facility], [address], [place], [place] [address]    Dg Chest 2 View  Result Date: 11/17/2017 CLINICAL DATA:  Chest pain and shortness of breath radiating to the right since yesterday. EXAM: CHEST  2 VIEW COMPARISON:  10/20/2017.  Abdomen and pelvis CT dated 01/24/2017. FINDINGS: Stable enlarged cardiac silhouette with a globular configuration. Mild prominence of the pulmonary vasculature and mild  diffuse peribronchial thickening are stable. No pleural fluid. Unremarkable bones. IMPRESSION: 1. Stable cardiomegaly with a globular configuration. This appearance can be seen with a pericardial effusion. The patient had a pericardial effusion on 01/24/2017. 2. Stable mild pulmonary vascular congestion and mild chronic bronchitic changes. Electronically Signed   By: Claudie Revering M.D.   On: 11/17/2017 14:27    Review of Systems  Constitutional: Negative for chills and fever.  HENT: Positive for congestion.   Respiratory: Positive for shortness of breath and wheezing. Negative for cough and sputum production.   Cardiovascular: Positive for orthopnea and leg swelling. Negative for chest pain.  Gastrointestinal: Negative for nausea and vomiting.  Neurological: Positive for weakness.   Blood pressure 102/70, pulse (!) 106, temperature 97.7 F (36.5 C), temperature source Oral, resp. rate 14, height _0  (1.651 m), weight 61.2 kg (135 lb), SpO2 95 %. Physical Exam  Constitutional: No distress.  Neck: JVD present.  Cardiovascular: Normal rate and regular rhythm.  Murmur heard. Respiratory: No respiratory distress. He has wheezes. He has rales.  GI: He exhibits no distension. There is no tenderness.  Musculoskeletal: He exhibits edema.    Assessment/Plan: 1] difficulty breathing: Most likely secondary to fluid overload.  This is secondary to noncompliance with his salt and fluid intake and possibly inadequate dialysis.  Chest x-ray with mild pulmonary vascular congestion. 2] end-stage renal disease: Status post hemodialysis on Thursday.  Potassium high normal.  He does not have any nausea or vomiting. 3] history of HIV 4] history of asthma  5 anemia: His hemoglobin is below target goal 6] bone and mineral disorder: His calcium is a range Plan: We will make arrangement for patient to get dialysis for 3-1/2 hours 2] will remove about 3 L if systolic blood pressure remains above 90 3] patient  advised to cut down his salt and fluid intake 4] will use Epogen 6000 units IV after each dialysis 5] we will check his CBC and renal panel in the morning 6] once patient is discharged to go back to his regular unit.  Karri Kallenbach S 11/17/2017, 5:32 PM

## 2017-11-17 NOTE — ED Triage Notes (Signed)
Pt sob x 1 day. Went to dialysis last on Thursday, but was unable to finish tx d/t issue with fistula.  Swelling to bilateral lower extremities and abdomen.

## 2017-11-17 NOTE — Procedures (Signed)
    HEMODIALYSIS TREATMENT NOTE:   3.5 hour heparin-free dialysis completed via right upper arm AVG (16g/antegrade). Goal met: 3 liters removed without interruption in ultrafiltration.  All blood was returned and hemostasis was achieved within 15 minutes. Report given to Maryjean Morn, RN.  Rockwell Alexandria, RN, CDN

## 2017-11-18 ENCOUNTER — Inpatient Hospital Stay (HOSPITAL_COMMUNITY): Payer: Medicare Other

## 2017-11-18 ENCOUNTER — Encounter (HOSPITAL_COMMUNITY): Payer: Self-pay | Admitting: Family Medicine

## 2017-11-18 DIAGNOSIS — I501 Left ventricular failure: Secondary | ICD-10-CM

## 2017-11-18 DIAGNOSIS — I340 Nonrheumatic mitral (valve) insufficiency: Secondary | ICD-10-CM

## 2017-11-18 LAB — CBC
HCT: 28.5 % — ABNORMAL LOW (ref 39.0–52.0)
HEMOGLOBIN: 9 g/dL — AB (ref 13.0–17.0)
MCH: 34.1 pg — AB (ref 26.0–34.0)
MCHC: 31.6 g/dL (ref 30.0–36.0)
MCV: 108 fL — ABNORMAL HIGH (ref 78.0–100.0)
Platelets: 194 10*3/uL (ref 150–400)
RBC: 2.64 MIL/uL — AB (ref 4.22–5.81)
RDW: 18.6 % — ABNORMAL HIGH (ref 11.5–15.5)
WBC: 4.2 10*3/uL (ref 4.0–10.5)

## 2017-11-18 LAB — ECHOCARDIOGRAM COMPLETE
HEIGHTINCHES: 65 in
Weight: 1963.2 oz

## 2017-11-18 LAB — BASIC METABOLIC PANEL
Anion gap: 19 — ABNORMAL HIGH (ref 5–15)
BUN: 37 mg/dL — ABNORMAL HIGH (ref 6–20)
CHLORIDE: 98 mmol/L — AB (ref 101–111)
CO2: 24 mmol/L (ref 22–32)
CREATININE: 8.6 mg/dL — AB (ref 0.61–1.24)
Calcium: 8.9 mg/dL (ref 8.9–10.3)
GFR calc Af Amer: 9 mL/min — ABNORMAL LOW (ref >60)
GFR calc non Af Amer: 7 mL/min — ABNORMAL LOW (ref >60)
Glucose, Bld: 185 mg/dL — ABNORMAL HIGH (ref 65–99)
Potassium: 4.7 mmol/L (ref 3.5–5.1)
Sodium: 141 mmol/L (ref 135–145)

## 2017-11-18 LAB — RENAL FUNCTION PANEL
Albumin: 3 g/dL — ABNORMAL LOW (ref 3.5–5.0)
Anion gap: 18 — ABNORMAL HIGH (ref 5–15)
BUN: 37 mg/dL — ABNORMAL HIGH (ref 6–20)
CO2: 24 mmol/L (ref 22–32)
CREATININE: 8.58 mg/dL — AB (ref 0.61–1.24)
Calcium: 8.8 mg/dL — ABNORMAL LOW (ref 8.9–10.3)
Chloride: 97 mmol/L — ABNORMAL LOW (ref 101–111)
GFR calc Af Amer: 9 mL/min — ABNORMAL LOW (ref >60)
GFR calc non Af Amer: 8 mL/min — ABNORMAL LOW (ref >60)
GLUCOSE: 185 mg/dL — AB (ref 65–99)
Phosphorus: 2.6 mg/dL (ref 2.5–4.6)
Potassium: 4.7 mmol/L (ref 3.5–5.1)
SODIUM: 139 mmol/L (ref 135–145)

## 2017-11-18 LAB — MRSA PCR SCREENING: MRSA by PCR: NEGATIVE

## 2017-11-18 MED ORDER — IPRATROPIUM-ALBUTEROL 0.5-2.5 (3) MG/3ML IN SOLN: 3.0000 mL | Freq: Four times a day (QID) | RESPIRATORY_TRACT | Status: DC | PRN

## 2017-11-18 MED ORDER — METHYLPREDNISOLONE SODIUM SUCC 125 MG IJ SOLR
60.0000 mg | Freq: Two times a day (BID) | INTRAMUSCULAR | Status: AC
  Administered 2017-11-18: 60 mg via INTRAVENOUS
  Filled 2017-11-18: qty 2

## 2017-11-18 NOTE — Progress Notes (Signed)
*  PRELIMINARY RESULTS* Echocardiogram 2D Echocardiogram has been performed.  Dave Estrada 11/18/2017, 10:03 AM

## 2017-11-18 NOTE — Progress Notes (Signed)
Subjective: Interval History: has no complaint of nausea or vomiting.  His appetite is okay.  Breathing is somewhat better..  Objective: Vital signs in last 24 hours: Temp:  [97.7 F (36.5 C)-97.9 F (36.6 C)] 97.8 F (36.6 C) (02/02 2215) Pulse Rate:  [98-115] 105 (02/02 2215) Resp:  [11-20] 16 (02/02 2215) BP: (99-129)/(55-90) 114/73 (02/02 2215) SpO2:  [95 %-98 %] 98 % (02/03 0740) Weight:  [55.7 kg (122 lb 11.2 oz)-61.2 kg (135 lb)] 55.7 kg (122 lb 11.2 oz) (02/03 0600) Weight change:   Intake/Output from previous day: 02/02 0701 - 02/03 0700 In: -  Out: 3000  Intake/Output this shift: No intake/output data recorded.  General appearance: alert, cooperative and no distress Resp: diminished breath sounds bilaterally Cardio: regular rate and rhythm GI: Abdomen distended, nontender and positive bowel sound Extremities: He has trace edema and chronic skin changes  Lab Results: Recent Labs    11/17/17 1416 11/18/17 0700  WBC 4.3 4.2  HGB 8.7* 9.0*  HCT 27.6* 28.5*  PLT 184 194   BMET:  Recent Labs    11/17/17 1416 11/18/17 0700  NA 138 139  141  K 5.2* 4.7  4.7  CL 98* 97*  98*  CO2 23 24  24   GLUCOSE 104* 185*  185*  BUN 70* 37*  37*  CREATININE 13.77* 8.58*  8.60*  CALCIUM 8.2* 8.8*  8.9   No results for input(s): PTH in the last 72 hours. Iron Studies: No results for input(s): IRON, TIBC, TRANSFERRIN, FERRITIN in the last 72 hours.  Studies/Results: Dg Chest 2 View  Result Date: 11/17/2017 CLINICAL DATA:  Chest pain and shortness of breath radiating to the right since yesterday. EXAM: CHEST  2 VIEW COMPARISON:  10/20/2017.  Abdomen and pelvis CT dated 01/24/2017. FINDINGS: Stable enlarged cardiac silhouette with a globular configuration. Mild prominence of the pulmonary vasculature and mild diffuse peribronchial thickening are stable. No pleural fluid. Unremarkable bones. IMPRESSION: 1. Stable cardiomegaly with a globular configuration. This  appearance can be seen with a pericardial effusion. The patient had a pericardial effusion on 01/24/2017. 2. Stable mild pulmonary vascular congestion and mild chronic bronchitic changes. Electronically Signed   By: Beckie Salts M.D.   On: 11/17/2017 14:27    I have reviewed the patient's current medications.  Assessment/Plan: 1] difficulty breathing: Secondary to fluid overload and possible exacerbation of his asthma.  Patient was dialyzed yesterday and able to remove about 3 L and feeling better. 2] end-stage renal disease: Potassium is normal.  He does not have any uremic signs and symptoms but his appetite is poor.  Yesterday dialysis is his regular schedule. 3] bone and mineral disorder: His calcium and phosphorus is range 4] anemia: His hemoglobin is within our target goal  5] HIV 6] asthma/COPD.  Presently he is on steroid and albuterol inhaler. Plan: We will make arrangement for patient to get dialysis tomorrow if he is not going to be discharged. 2] his next regular dialysis day would be on Tuesday. 3] we will try to remove another 3 L with dialysis tomorrow. 4] we will check his renal panel in the morning and continue with Epogen   LOS: 1 day   Timarie Labell S 11/18/2017,9:57 AM

## 2017-11-18 NOTE — Progress Notes (Signed)
Patient has refused nebs twice today complaining that they dry his throat out. He sounds clear at this time so will make his treatments as needed. Saturation is 98 on room air.

## 2017-11-18 NOTE — Progress Notes (Addendum)
PROGRESS NOTE    Dave Estrada  ZOX:[id number]  DOB: 11-Dec-1990  DOA: 11/17/2017 PCP: Patient, No Pcp Per   Brief Admission Hx: Dave Estrada is a 27 y.o. male with a history of HIV, end-stage renal disease on dialysis Tuesday Thursday Saturday, asthma, nonischemic cardiomyopathy with systolic heart failure with EF of 15-20% on 01/2016.  Patient seen due to increasing shortness of breath that started 3-4 days ago, worse with exertion and improved with rest.  MDM/Assessment & Plan:   1. Nonischemic cardiomyopathy with acute on chronic systolic heart failure-patient received hemodialysis with removal of 3.5 L of fluid.  He is feeling better.  Nephrology is planning to repeat dialysis tomorrow with a goal of removing 3 more liters of fluid.  The patient has agreed to remain in the hospital so that he can have a treatment tomorrow.  He is adamant that he will discharge after he completes hemodialysis tomorrow.  The patient had an echocardiogram done this admission that confirmed an EF of 10-15%. 2. End stage renal disease on hemodialysis-hemodialysis tomorrow per nephrology team with goal of removing 3 additional liters of fluid. 3. HIV-resume home medications. 4. Acute asthma exacerbation- most likely cardiac asthma, improved with fluid removal, continue neb treatments and reduce steroids as he is clinically improving. 5. Anemia in CKD  DVT prophylaxis: Heparin Code Status: Full Family Communication: N/A Disposition Plan: Home tomorrow  Consultants: Nephrology Procedures:  Hemodialysis  Subjective: The patient reports that he feels better today.  3 L was removed with hemodialysis.  He wants to go home but he is willing to stay an additional day for another treatment tomorrow.  He reports that his breathing is improved.  Objective: Vitals:   11/18/17 0600 11/18/17 0740 11/18/17 1400 11/18/17 1402  BP:   103/80   Pulse:   (!) 101 (!) 108  Resp:   20 17  Temp:   98.1 F (36.7  C)   TempSrc:   Oral   SpO2:  98% 97% 98%  Weight: 55.7 kg (122 lb 11.2 oz)     Height:        Intake/Output Summary (Last 24 hours) at 11/18/2017 1516 Last data filed at 11/18/2017 1200 Gross per 24 hour  Intake 480 ml  Output 3000 ml  Net -2520 ml   Filed Weights   11/17/17 1825 11/17/17 2215 11/18/17 0600  Weight: 60.5 kg (133 lb 6.1 oz) 57 kg (125 lb 10.6 oz) 55.7 kg (122 lb 11.2 oz)   REVIEW OF SYSTEMS  As per history otherwise all reviewed and reported negative  Exam:  General exam: Awake, alert, cooperative, no distress, ambulating in the room Respiratory system: No increased work of breathing. Cardiovascular system: S1 & S2 heard,. Gastrointestinal system: Abdomen is nondistended, soft and nontender. Normal bowel sounds heard. Central nervous system: Alert and oriented. No focal neurological deficits. Extremities: no cyanosis.  Data Reviewed: Basic Metabolic Panel: Recent Labs  Lab 11/17/17 1416 11/18/17 0700  NA 138 139  141  K 5.2* 4.7  4.7  CL 98* 97*  98*  CO2 23 24  24   GLUCOSE 104* 185*  185*  BUN 70* 37*  37*  CREATININE 13.77* 8.58*  8.60*  CALCIUM 8.2* 8.8*  8.9  PHOS  --  2.6   Liver Function Tests: Recent Labs  Lab 11/18/17 0700  ALBUMIN 3.0*   No results for input(s): LIPASE, AMYLASE in the last 168 hours. No results for input(s): AMMONIA in the last 168 hours. CBC:  Recent Labs  Lab 11/17/17 1416 11/18/17 0700  WBC 4.3 4.2  HGB 8.7* 9.0*  HCT 27.6* 28.5*  MCV 108.2* 108.0*  PLT 184 194   Cardiac Enzymes: No results for input(s): CKTOTAL, CKMB, CKMBINDEX, TROPONINI in the last 168 hours. CBG (last 3)  No results for input(s): GLUCAP in the last 72 hours. Recent Results (from the past 240 hour(s))  MRSA PCR Screening     Status: None   Collection Time: 11/17/17  7:00 PM  Result Value Ref Range Status   MRSA by PCR NEGATIVE NEGATIVE Final    Comment:        The GeneXpert MRSA Assay (FDA approved for NASAL  specimens only), is one component of a comprehensive MRSA colonization surveillance program. It is not intended to diagnose MRSA infection nor to guide or monitor treatment for MRSA infections. Performed at [facility], [address], [place], [place] [address]      Studies: Dg Chest 2 View  Result Date: 11/17/2017 CLINICAL DATA:  Chest pain and shortness of breath radiating to the right since yesterday. EXAM: CHEST  2 VIEW COMPARISON:  10/20/2017.  Abdomen and pelvis CT dated 01/24/2017. FINDINGS: Stable enlarged cardiac silhouette with a globular configuration. Mild prominence of the pulmonary vasculature and mild diffuse peribronchial thickening are stable. No pleural fluid. Unremarkable bones. IMPRESSION: 1. Stable cardiomegaly with a globular configuration. This appearance can be seen with a pericardial effusion. The patient had a pericardial effusion on 01/24/2017. 2. Stable mild pulmonary vascular congestion and mild chronic bronchitic changes. Electronically Signed   By: Beckie Salts M.D.   On: 11/17/2017 14:27   Scheduled Meds: . [START ON 11/19/2017] emtricitabine  200 mg Oral Once per day on Mon Thu  . etravirine  200 mg Oral Q12H  . heparin  5,000 Units Subcutaneous Q8H  . ipratropium-albuterol  3 mL Nebulization Q6H  . methylPREDNISolone (SOLU-MEDROL) injection  60 mg Intravenous Q12H  . metoprolol succinate  50 mg Oral Daily  . raltegravir  400 mg Oral BID  . sodium chloride flush  3 mL Intravenous Q12H  . [START ON 11/20/2017] sulfamethoxazole-trimethoprim  1 tablet Oral Q T,Th,Sat-1800  . [START ON 11/20/2017] tenofovir  300 mg Oral Weekly   Continuous Infusions: . sodium chloride    . sodium chloride    . sodium chloride      Principal Problem:   Acute on chronic systolic CHF (congestive heart failure) (HCC) Active Problems:   HIV (human immunodeficiency virus infection) (HCC)   ESRD on hemodialysis (HCC)   Nonischemic cardiomyopathy (HCC)   Acute asthma  exacerbation  Time spent:   Standley Dakins, MD, FAAFP [facility] Pager [phone]  If 7PM-7AM, please contact night-coverage [email or web address] Password [email or web address] 11/18/2017, 3:16 PM    LOS: 1 day

## 2017-11-19 LAB — CBC
HEMATOCRIT: 33.2 % — AB (ref 39.0–52.0)
Hemoglobin: 10.5 g/dL — ABNORMAL LOW (ref 13.0–17.0)
MCH: 34.4 pg — ABNORMAL HIGH (ref 26.0–34.0)
MCHC: 31.6 g/dL (ref 30.0–36.0)
MCV: 108.9 fL — ABNORMAL HIGH (ref 78.0–100.0)
PLATELETS: 241 10*3/uL (ref 150–400)
RBC: 3.05 MIL/uL — AB (ref 4.22–5.81)
RDW: 18.4 % — AB (ref 11.5–15.5)
WBC: 6.8 10*3/uL (ref 4.0–10.5)

## 2017-11-19 LAB — RENAL FUNCTION PANEL
Albumin: 3.1 g/dL — ABNORMAL LOW (ref 3.5–5.0)
BUN: 55 mg/dL — ABNORMAL HIGH (ref 6–20)
CALCIUM: 9 mg/dL (ref 8.9–10.3)
CO2: 20 mmol/L — ABNORMAL LOW (ref 22–32)
Chloride: 93 mmol/L — ABNORMAL LOW (ref 101–111)
Creatinine, Ser: 9.88 mg/dL — ABNORMAL HIGH (ref 0.61–1.24)
GFR, EST AFRICAN AMERICAN: 7 mL/min — AB (ref >60)
GFR, EST NON AFRICAN AMERICAN: 6 mL/min — AB (ref >60)
Glucose, Bld: 133 mg/dL — ABNORMAL HIGH (ref 65–99)
PHOSPHORUS: 4.9 mg/dL — AB (ref 2.5–4.6)
POTASSIUM: 5.4 mmol/L — AB (ref 3.5–5.1)
Sodium: 134 mmol/L — ABNORMAL LOW (ref 135–145)

## 2017-11-19 MED ORDER — SODIUM CHLORIDE 0.9 % IV SOLN: 100.0000 mL | INTRAVENOUS | Status: DC | PRN

## 2017-11-19 MED ORDER — HEPARIN SODIUM (PORCINE) 1000 UNIT/ML DIALYSIS
1000.0000 [IU] | INTRAMUSCULAR | Status: DC | PRN
  Filled 2017-11-19: qty 1

## 2017-11-19 MED ORDER — ALTEPLASE 2 MG IJ SOLR
2.0000 mg | Freq: Once | INTRAMUSCULAR | Status: DC | PRN
  Filled 2017-11-19: qty 2

## 2017-11-19 MED ORDER — DIPHENHYDRAMINE HCL 50 MG/ML IJ SOLN
25.0000 mg | Freq: Once | INTRAMUSCULAR | Status: AC
  Administered 2017-11-19: 25 mg via INTRAVENOUS
  Filled 2017-11-19: qty 1

## 2017-11-19 NOTE — Care Management Important Message (Signed)
Important Message  Patient Details  Name: CHEY BRENDLE MRN: [id number] Date of Birth: 1991-09-22   Medicare Important Message Given:  Yes    Jeremian Whitby, Chrystine Oiler, RN 11/19/2017, 10:42 AM

## 2017-11-19 NOTE — Discharge Instructions (Signed)
Follow with Primary MD  Patient, No Pcp Per  and other consultant's as instructed your Hospitalist MD ° °Please get a complete blood count and chemistry panel checked by your Primary MD at your next visit, and again as instructed by your Primary MD. ° °Get Medicines reviewed and adjusted: °Please take all your medications with you for your next visit with your Primary MD ° °Laboratory/radiological data: °Please request your Primary MD to go over all hospital tests and procedure/radiological results at the follow up, please ask your Primary MD to get all Hospital records sent to his/her office. ° °In some cases, they will be blood work, cultures and biopsy results pending at the time of your discharge. Please request that your primary care M.D. follows up on these results. ° °Also Note the following: °If you experience worsening of your admission symptoms, develop shortness of breath, life threatening emergency, suicidal or homicidal thoughts you must seek medical attention immediately by calling 911 or calling your MD immediately  if symptoms less severe. ° °You must read complete instructions/literature along with all the possible adverse reactions/side effects for all the Medicines you take and that have been prescribed to you. Take any new Medicines after you have completely understood and accpet all the possible adverse reactions/side effects.  ° °Do not drive when taking Pain medications or sleeping medications (Benzodaizepines) ° °Do not take more than prescribed Pain, Sleep and Anxiety Medications. It is not advisable to combine anxiety,sleep and pain medications without talking with your primary care practitioner ° °Special Instructions: If you have smoked or chewed Tobacco  in the last 2 yrs please stop smoking, stop any regular Alcohol  and or any Recreational drug use. ° °Wear Seat belts while driving. ° °Please note: °You were cared for by a hospitalist during your hospital stay. Once you are discharged,  your primary care physician will handle any further medical issues. Please note that NO REFILLS for any discharge medications will be authorized once you are discharged, as it is imperative that you return to your primary care physician (or establish a relationship with a primary care physician if you do not have one) for your post hospital discharge needs so that they can reassess your need for medications and monitor your lab values. ° ° ° ° °

## 2017-11-19 NOTE — Progress Notes (Signed)
Subjective: Interval History: has no complaint of nausea or vomiting.  His breathing is much better..  Objective: Vital signs in last 24 hours: Temp:  [97.6 F (36.4 C)-98.1 F (36.7 C)] 97.6 F (36.4 C) (02/04 0545) Pulse Rate:  [101-108] 103 (02/04 0545) Resp:  [17-20] 18 (02/04 0545) BP: (96-103)/(74-80) 96/74 (02/04 0545) SpO2:  [97 %-100 %] 100 % (02/04 0545) Weight:  [55.7 kg (122 lb 14.4 oz)] 55.7 kg (122 lb 14.4 oz) (02/04 0545) Weight change: -5.489 kg (-1.6 oz)  Intake/Output from previous day: 02/03 0701 - 02/04 0700 In: 843 [P.O.:840; I.V.:3] Out: -  Intake/Output this shift: No intake/output data recorded.  General appearance: alert, cooperative and no distress Resp: diminished breath sounds bilaterally and wheezes bilaterally Cardio: regular rate and rhythm Extremities: edema Trace edema  Lab Results: Recent Labs    11/17/17 1416 11/18/17 0700  WBC 4.3 4.2  HGB 8.7* 9.0*  HCT 27.6* 28.5*  PLT 184 194   BMET:  Recent Labs    11/18/17 0700 11/19/17 0606  NA 139  141 134*  K 4.7  4.7 5.4*  CL 97*  98* 93*  CO2 24  24 20*  GLUCOSE 185*  185* 133*  BUN 37*  37* 55*  CREATININE 8.58*  8.60* 9.88*  CALCIUM 8.8*  8.9 9.0   No results for input(s): PTH in the last 72 hours. Iron Studies: No results for input(s): IRON, TIBC, TRANSFERRIN, FERRITIN in the last 72 hours.  Studies/Results: Dg Chest 2 View  Result Date: 11/17/2017 CLINICAL DATA:  Chest pain and shortness of breath radiating to the right since yesterday. EXAM: CHEST  2 VIEW COMPARISON:  10/20/2017.  Abdomen and pelvis CT dated 01/24/2017. FINDINGS: Stable enlarged cardiac silhouette with a globular configuration. Mild prominence of the pulmonary vasculature and mild diffuse peribronchial thickening are stable. No pleural fluid. Unremarkable bones. IMPRESSION: 1. Stable cardiomegaly with a globular configuration. This appearance can be seen with a pericardial effusion. The patient had a  pericardial effusion on 01/24/2017. 2. Stable mild pulmonary vascular congestion and mild chronic bronchitic changes. Electronically Signed   By: Beckie Salts M.D.   On: 11/17/2017 14:27    I have reviewed the patient's current medications.  Assessment/Plan: 1] difficulty breathing: Most likely a combination of asthma and fluid overload.  Presently patient is on dialysis.  Is feeling much better. 2] end-stage renal disease: As stated above patient on dialysis.  Patient does not have any nausea or vomiting.  His potassium is 5.4 3] hypertension: His blood pressure is reasonably controlled 4] bone and mineral disorder: His calcium and phosphorus is range 5] anemia: His hemoglobin is below our target goal but stable 6] history of HIV positive Plan: 1] we will try to remove 3 L if his systolic blood pressure remains above 90 2] patient advised to decrease his salt and fluid intake 3] if patient is going to be discharged we will go to his regular dialysis unit tomorrow if not we will make arrangement for him to be dialysis on Wednesday.    LOS: 2 days   Mahagony Grieb S 11/19/2017,10:26 AM

## 2017-11-19 NOTE — Discharge Summary (Signed)
Physician Discharge Summary  Dave Estrada ZOX:[id number] DOB: Jul 07, 1991 DOA: 11/17/2017  PCP: Patient, No Pcp Per  Admit date: 11/17/2017 Discharge date: 11/19/2017  Admitted From: Home  Disposition: Home   Discharge Condition: STABLE   CODE STATUS: FULL    Brief Hospitalization Summary: Please see all hospital notes, images, labs for full details of the hospitalization.  HPI: Dave Estrada is a 27 y.o. male with a history of HIV, end-stage renal disease on dialysis Tuesday Thursday Saturday, asthma, ischemic cardiomyopathy with systolic heart failure with EF of 15-20% on 01/2016.  Patient seen due to increasing shortness of breath that started 3-4 days ago, worse with exertion and improved with rest.  Symptoms also worse with lying down patient went to dialysis on Thursday and only had 2 L taken off due to "problems with his fistula".  Patient was having increasing shortness of breath today and was supposed to go to dialysis, however he opted to come here for evaluation instead hoping to have more fluid taken off.  No other palliating or provoking factors.  Emergency Department Course: Potassium elevated to 5.2, creatinine 13.77, chest x-ray shows pulmonary edema  Brief Admission Hx: Dave Estrada a 26 y.o.malewith a history of HIV, end-stage renal disease on dialysis Tuesday Thursday Saturday, asthma, nonischemic cardiomyopathy with systolic heart failure with EF of 15-20% on 01/2016. Patient seen due to increasing shortness of breath that started 3-4 days ago, worse with exertion and improved with rest.  MDM/Assessment & Plan:   1. Nonischemic cardiomyopathy with acute on chronic systolic heart failure-patient received hemodialysis with removal of 3.5 L of fluid.  He is feeling better.  Nephrology is planning to repeat dialysis tomorrow with a goal of removing 3 more liters of fluid.  The patient has agreed to remain in the hospital so that he can have a treatment tomorrow.   He is adamant that he will discharge after he completes hemodialysis tomorrow.  The patient had an echocardiogram done this admission that confirmed an EF of 10-15%.  He received an additional hemodialysis treatment 2/4.  He feels much better and wants to discharge home.  2. End stage renal disease on hemodialysis-hemodialysis was given 2/3, 2/4, per nephrology team with goal of removing 3  liters of fluid per treatment. 3. HIV-resume home medications. 4. Acute asthma exacerbation- most likely cardiac asthma, improved with fluid removal. 5. Anemia in CKD - Hg 10.5 at discharge day.  DVT prophylaxis: Heparin Code Status: Full Family Communication: N/A Disposition Plan: Home  Consultants: Nephrology Procedures:  Hemodialysis 2/3, 2/4  Discharge Diagnoses:  Principal Problem:   Acute on chronic systolic CHF (congestive heart failure) (HCC) Active Problems:   HIV (human immunodeficiency virus infection) (HCC)   ESRD on hemodialysis (HCC)   Nonischemic cardiomyopathy (HCC)   Acute asthma exacerbation  Discharge Instructions: Discharge Instructions    (HEART FAILURE PATIENTS) Call MD:  Anytime you have any of the following symptoms: 1) 3 pound weight gain in 24 hours or 5 pounds in 1 week 2) shortness of breath, with or without a dry hacking cough 3) swelling in the hands, feet or stomach 4) if you have to sleep on extra pillows at night in order to breathe.   Complete by:  As directed      Allergies as of 11/19/2017      Reactions   Bee Venom Anaphylaxis   Other Anaphylaxis   mushrooms   Aspirin Other (See Comments)   Reaction is unknown  Medication List    TAKE these medications   albuterol 108 (90 Base) MCG/ACT inhaler Commonly known as:  PROVENTIL HFA;VENTOLIN HFA Inhale 1-2 puffs into the lungs every 6 (six) hours as needed for wheezing or shortness of breath.   emtricitabine 200 MG capsule Commonly known as:  EMTRIVA Take 200 mg by mouth 2 (two) times a week.  Taken after dialysis on Tuesday and Thursday   etravirine 100 MG tablet Commonly known as:  INTELENCE Take 200 mg by mouth every 12 (twelve) hours.   gabapentin 100 MG capsule Commonly known as:  NEURONTIN Take 100 mg by mouth 2 (two) times a week. On Tuesdays and Thursdays after dialysis   hydrocortisone 2.5 % cream Apply 1 application topically 2 (two) times daily.   hydrOXYzine 25 MG tablet Commonly known as:  ATARAX/VISTARIL Take 25 mg by mouth 2 (two) times daily as needed for itching.   metoprolol succinate 50 MG 24 hr tablet Commonly known as:  TOPROL-XL Take 50 mg by mouth daily.   oxyCODONE 5 MG immediate release tablet Commonly known as:  Oxy IR/ROXICODONE Take 5 mg by mouth daily as needed for moderate pain.   raltegravir 400 MG tablet Commonly known as:  ISENTRESS Take 400 mg by mouth 2 (two) times daily.   tenofovir 300 MG tablet Commonly known as:  VIREAD Take 300 mg by mouth once a week. To be taken on Tuesday after dialysis      Follow-up Information    [facility]. Go in 2 day(s).   Why:  for regularly scheduled hemodialysis         Allergies  Allergen Reactions  . Bee Venom Anaphylaxis  . Other Anaphylaxis    mushrooms  . Aspirin Other (See Comments)    Reaction is unknown   Allergies as of 11/19/2017      Reactions   Bee Venom Anaphylaxis   Other Anaphylaxis   mushrooms   Aspirin Other (See Comments)   Reaction is unknown      Medication List    TAKE these medications   albuterol 108 (90 Base) MCG/ACT inhaler Commonly known as:  PROVENTIL HFA;VENTOLIN HFA Inhale 1-2 puffs into the lungs every 6 (six) hours as needed for wheezing or shortness of breath.   emtricitabine 200 MG capsule Commonly known as:  EMTRIVA Take 200 mg by mouth 2 (two) times a week. Taken after dialysis on Tuesday and Thursday   etravirine 100 MG tablet Commonly known as:  INTELENCE Take 200 mg by mouth every 12 (twelve) hours.   gabapentin 100 MG  capsule Commonly known as:  NEURONTIN Take 100 mg by mouth 2 (two) times a week. On Tuesdays and Thursdays after dialysis   hydrocortisone 2.5 % cream Apply 1 application topically 2 (two) times daily.   hydrOXYzine 25 MG tablet Commonly known as:  ATARAX/VISTARIL Take 25 mg by mouth 2 (two) times daily as needed for itching.   metoprolol succinate 50 MG 24 hr tablet Commonly known as:  TOPROL-XL Take 50 mg by mouth daily.   oxyCODONE 5 MG immediate release tablet Commonly known as:  Oxy IR/ROXICODONE Take 5 mg by mouth daily as needed for moderate pain.   raltegravir 400 MG tablet Commonly known as:  ISENTRESS Take 400 mg by mouth 2 (two) times daily.   tenofovir 300 MG tablet Commonly known as:  VIREAD Take 300 mg by mouth once a week. To be taken on Tuesday after dialysis       Procedures/Studies:  Dg Chest 2 View  Result Date: 11/17/2017 CLINICAL DATA:  Chest pain and shortness of breath radiating to the right since yesterday. EXAM: CHEST  2 VIEW COMPARISON:  10/20/2017.  Abdomen and pelvis CT dated 01/24/2017. FINDINGS: Stable enlarged cardiac silhouette with a globular configuration. Mild prominence of the pulmonary vasculature and mild diffuse peribronchial thickening are stable. No pleural fluid. Unremarkable bones. IMPRESSION: 1. Stable cardiomegaly with a globular configuration. This appearance can be seen with a pericardial effusion. The patient had a pericardial effusion on 01/24/2017. 2. Stable mild pulmonary vascular congestion and mild chronic bronchitic changes. Electronically Signed   By: Beckie Salts M.D.   On: 11/17/2017 14:27   US Venous Img Lower Bilateral  Result Date: 10/21/2017 CLINICAL DATA:  27 year old male with bilateral leg pain and edema for the past 20 years EXAM: BILATERAL LOWER EXTREMITY VENOUS DOPPLER ULTRASOUND TECHNIQUE: Gray-scale sonography with graded compression, as well as color Doppler and duplex ultrasound were performed to evaluate the  lower extremity deep venous systems from the level of the common femoral vein and including the common femoral, femoral, profunda femoral, popliteal and calf veins including the posterior tibial, peroneal and gastrocnemius veins when visible. The superficial great saphenous vein was also interrogated. Spectral Doppler was utilized to evaluate flow at rest and with distal augmentation maneuvers in the common femoral, femoral and popliteal veins. COMPARISON:  None. FINDINGS: RIGHT LOWER EXTREMITY Common Femoral Vein: No evidence of thrombus. Normal compressibility, respiratory phasicity and response to augmentation. Increased pulsatility of the venous waveform. Saphenofemoral Junction: No evidence of thrombus. Normal compressibility and flow on color Doppler imaging. Profunda Femoral Vein: No evidence of thrombus. Normal compressibility and flow on color Doppler imaging. Femoral Vein: No evidence of thrombus. Normal compressibility, respiratory phasicity and response to augmentation. Popliteal Vein: No evidence of thrombus. Normal compressibility, respiratory phasicity and response to augmentation. Calf Veins: No evidence of thrombus. Normal compressibility and flow on color Doppler imaging. Superficial Great Saphenous Vein: No evidence of thrombus. Normal compressibility. Venous Reflux:  None. Other Findings:  None. LEFT LOWER EXTREMITY Common Femoral Vein: No evidence of thrombus. Normal compressibility, respiratory phasicity and response to augmentation. Increased pulsatility of the venous waveform. Saphenofemoral Junction: No evidence of thrombus. Normal compressibility and flow on color Doppler imaging. Profunda Femoral Vein: No evidence of thrombus. Normal compressibility and flow on color Doppler imaging. Femoral Vein: No evidence of thrombus. Normal compressibility, respiratory phasicity and response to augmentation. Popliteal Vein: No evidence of thrombus. Normal compressibility, respiratory phasicity and  response to augmentation. Calf Veins: No evidence of thrombus. Normal compressibility and flow on color Doppler imaging. Superficial Great Saphenous Vein: No evidence of thrombus. Normal compressibility. Venous Reflux:  None. Other Findings:  None. IMPRESSION: 1. No evidence of deep venous thrombosis in either lower extremity. 2. Markedly increased pulsatility of the venous waveforms suggests elevated right heart pressures. Differential considerations include right heart failure, tricuspid regurgitation, chronic COPD and pulmonary arterial hypertension. Signed, Sterling Big, MD [facility] Electronically Signed   By: Malachy Moan M.D.   On: 10/21/2017 09:54      Subjective: Pt feels a lot better after HD x 2, ready to discharge.  Discharge Exam: Vitals:   11/19/17 1300 11/19/17 1315  BP: 121/61 (!) 128/52  Pulse: 100 98  Resp:  18  Temp:  98 F (36.7 C)  SpO2:  100%   Vitals:   11/19/17 1200 11/19/17 1230 11/19/17 1300 11/19/17 1315  BP: 105/65 126/80 121/61 Marland Kitchen)  128/52  Pulse: 94 (!) 102 100 98  Resp:    18  Temp:    98 F (36.7 C)  TempSrc:    Oral  SpO2:    100%  Weight:    52.7 kg (116 lb 2.9 oz)  Height:       General exam: Awake, alert, cooperative, no distress, ambulating in the room Respiratory system: No increased work of breathing. Cardiovascular system: S1 & S2 heard,. Gastrointestinal system: Abdomen is nondistended, soft and nontender. Normal bowel sounds heard. Central nervous system: Alert and oriented. No focal neurological deficits. Extremities: no cyanosis.   The results of significant diagnostics from this hospitalization (including imaging, microbiology, ancillary and laboratory) are listed below for reference.     Microbiology: Recent Results (from the past 240 hour(s))  MRSA PCR Screening     Status: None   Collection Time: 11/17/17  7:00 PM  Result Value Ref Range Status   MRSA by  PCR NEGATIVE NEGATIVE Final    Comment:        The GeneXpert MRSA Assay (FDA approved for NASAL specimens only), is one component of a comprehensive MRSA colonization surveillance program. It is not intended to diagnose MRSA infection nor to guide or monitor treatment for MRSA infections. Performed at [facility], [address], [place], [place] [address]      Labs: BNP (last 3 results) No results for input(s): BNP in the last 8760 hours. Basic Metabolic Panel: Recent Labs  Lab 11/17/17 1416 11/18/17 0700 11/19/17 0606  NA 138 139  141 134*  K 5.2* 4.7  4.7 5.4*  CL 98* 97*  98* 93*  CO2 23 24  24  20*  GLUCOSE 104* 185*  185* 133*  BUN 70* 37*  37* 55*  CREATININE 13.77* 8.58*  8.60* 9.88*  CALCIUM 8.2* 8.8*  8.9 9.0  PHOS  --  2.6 4.9*   Liver Function Tests: Recent Labs  Lab 11/18/17 0700 11/19/17 0606  ALBUMIN 3.0* 3.1*   No results for input(s): LIPASE, AMYLASE in the last 168 hours. No results for input(s): AMMONIA in the last 168 hours. CBC: Recent Labs  Lab 11/17/17 1416 11/18/17 0700 11/19/17 0606  WBC 4.3 4.2 6.8  HGB 8.7* 9.0* 10.5*  HCT 27.6* 28.5* 33.2*  MCV 108.2* 108.0* 108.9*  PLT 184 194 241   Cardiac Enzymes: No results for input(s): CKTOTAL, CKMB, CKMBINDEX, TROPONINI in the last 168 hours. BNP: Invalid input(s): POCBNP CBG: No results for input(s): GLUCAP in the last 168 hours. D-Dimer No results for input(s): DDIMER in the last 72 hours. Hgb A1c No results for input(s): HGBA1C in the last 72 hours. Lipid Profile No results for input(s): CHOL, HDL, LDLCALC, TRIG, CHOLHDL, LDLDIRECT in the last 72 hours. Thyroid function studies No results for input(s): TSH, T4TOTAL, T3FREE, THYROIDAB in the last 72 hours.  Invalid input(s): FREET3 Anemia work up No results for input(s): VITAMINB12, FOLATE, FERRITIN, TIBC, IRON, RETICCTPCT in the last 72 hours. Urinalysis    Component Value Date/Time   COLORURINE AMBER (A)  07/18/2016 0123   APPEARANCEUR HAZY (A) 07/18/2016 0123   LABSPEC 1.015 07/18/2016 0123   PHURINE 7.5 07/18/2016 0123   GLUCOSEU 100 (A) 07/18/2016 0123   HGBUR MODERATE (A) 07/18/2016 0123   BILIRUBINUR MODERATE (A) 07/18/2016 0123   KETONESUR NEGATIVE 07/18/2016 0123   PROTEINUR >300 (A) 07/18/2016 0123   UROBILINOGEN 0.2 12/17/2014 0017   NITRITE NEGATIVE 07/18/2016 0123   LEUKOCYTESUR SMALL (A) 07/18/2016 0123   Sepsis  Labs Invalid input(s): PROCALCITONIN,  WBC,  LACTICIDVEN Microbiology Recent Results (from the past 240 hour(s))  MRSA PCR Screening     Status: None   Collection Time: 11/17/17  7:00 PM  Result Value Ref Range Status   MRSA by PCR NEGATIVE NEGATIVE Final    Comment:        The GeneXpert MRSA Assay (FDA approved for NASAL specimens only), is one component of a comprehensive MRSA colonization surveillance program. It is not intended to diagnose MRSA infection nor to guide or monitor treatment for MRSA infections. Performed at [facility], [address], [place], [place] [address]    Time coordinating discharge: 37 mins  SIGNED:  Standley Dakins, MD  [facility] 11/19/2017, 1:36 PM Pager [phone]  If 7PM-7AM, please contact night-coverage [email or web address] Password [email or web address]

## 2017-11-19 NOTE — Care Management Note (Signed)
Case Management Note  Patient Details  Name: Dave Estrada MRN: [id number] Date of Birth: 1991-09-30  Subjective/Objective:  Adm with CHF. ESRD on HD. Pt is independent with ADL's. Pt receives dialysis at [facility] in [place] T/TH/S.  Patient reports seeing nephrologist and cardiology as OP. No PCP.                  Action/Plan: PCP list given. No CM needs. Patient will DC home after HD today.    Expected Discharge Date:  11/19/17               Expected Discharge Plan:  Home/Self Care  In-House Referral:     Discharge planning Services  CM Consult  Post Acute Care Choice:  NA Choice offered to:  NA  DME Arranged:    DME Agency:     HH Arranged:    HH Agency:     Status of Service:  Completed, signed off  If discussed at [employer] of Stay Meetings, dates discussed:    Additional Comments:  Trinidi Toppins, Chrystine Oiler, RN 11/19/2017, 1:57 PM

## 2017-11-19 NOTE — Progress Notes (Signed)
Discharge instructions gone over with patient, verbalized understanding. IV removed, patient tolerated procedure well. Patient understands to follow up with his regular dialysis schedule.

## 2018-03-03 ENCOUNTER — Emergency Department (HOSPITAL_COMMUNITY)
Admission: EM | Admit: 2018-03-03 | Discharge: 2018-03-03 | Disposition: A | Discharge status: Home / Self Care | Payer: Medicare Other | Attending: Emergency Medicine | Admitting: Emergency Medicine

## 2018-03-03 ENCOUNTER — Encounter (HOSPITAL_COMMUNITY): Payer: Self-pay | Admitting: Emergency Medicine

## 2018-03-03 ENCOUNTER — Other Ambulatory Visit: Payer: Self-pay

## 2018-03-03 DIAGNOSIS — M79605 Pain in left leg: Secondary | ICD-10-CM | POA: Insufficient documentation

## 2018-03-03 DIAGNOSIS — N186 End stage renal disease: Secondary | ICD-10-CM | POA: Insufficient documentation

## 2018-03-03 DIAGNOSIS — N6452 Nipple discharge: Secondary | ICD-10-CM | POA: Insufficient documentation

## 2018-03-03 DIAGNOSIS — J45909 Unspecified asthma, uncomplicated: Secondary | ICD-10-CM | POA: Diagnosis not present

## 2018-03-03 DIAGNOSIS — M79604 Pain in right leg: Secondary | ICD-10-CM | POA: Diagnosis present

## 2018-03-03 DIAGNOSIS — Z79899 Other long term (current) drug therapy: Secondary | ICD-10-CM | POA: Insufficient documentation

## 2018-03-03 DIAGNOSIS — I5022 Chronic systolic (congestive) heart failure: Secondary | ICD-10-CM | POA: Insufficient documentation

## 2018-03-03 DIAGNOSIS — Z21 Asymptomatic human immunodeficiency virus [HIV] infection status: Secondary | ICD-10-CM | POA: Insufficient documentation

## 2018-03-03 DIAGNOSIS — R52 Pain, unspecified: Secondary | ICD-10-CM

## 2018-03-03 DIAGNOSIS — Z992 Dependence on renal dialysis: Secondary | ICD-10-CM | POA: Diagnosis not present

## 2018-03-03 DIAGNOSIS — F17210 Nicotine dependence, cigarettes, uncomplicated: Secondary | ICD-10-CM | POA: Insufficient documentation

## 2018-03-03 DIAGNOSIS — N644 Mastodynia: Secondary | ICD-10-CM | POA: Insufficient documentation

## 2018-03-03 LAB — D-DIMER, QUANTITATIVE (NOT AT ARMC): D DIMER QUANT: 3.53 ug{FEU}/mL — AB (ref 0.00–0.50)

## 2018-03-03 MED ORDER — MORPHINE SULFATE (PF) 4 MG/ML IV SOLN
4.0000 mg | Freq: Once | INTRAVENOUS | Status: AC
  Administered 2018-03-03: 4 mg via INTRAVENOUS
  Filled 2018-03-03: qty 1

## 2018-03-03 MED ORDER — ENOXAPARIN SODIUM 60 MG/0.6ML ~~LOC~~ SOLN
1.0000 mg/kg | Freq: Once | SUBCUTANEOUS | Status: AC
  Administered 2018-03-03: 21:00:00 55 mg via SUBCUTANEOUS
  Filled 2018-03-03: qty 0.6

## 2018-03-03 NOTE — ED Triage Notes (Signed)
Patient c/o bilateral leg pain x2 days that radiates up leg from calf. Denies any injury. Per patient pain worse with walking. Denies any swelling. Patient states that right leg felt warm than the left yesterday. Patient is dialysis patient, last dialysis yesterday (Tuesday, Thursday, Saturday). Patient also c/o knot on right breast x2 weeks with intermittent fevers and nausea and vomiting. Per patient patient "sticky" clear drainage.

## 2018-03-03 NOTE — ED Provider Notes (Signed)
[facility] EMERGENCY DEPARTMENT Provider Note   CSN: [id number] Arrival date & time: 03/03/18  1619     History   Chief Complaint Chief Complaint  Patient presents with  . Claudication    HPI Dave Estrada is a 27 y.o. male.  HPI Complains of bilateral leg pain, rightgreater than left for the past 2 days.pain is at right calf and radiates to the right distal posterior thigh. Pain is also at left calf Pain is worse with walking or with sitting. No trauma. No fever.Marland Kitchen He also reports cleardrainage from his right nipplewith slightpain for the past 2 weeks. No shortness of breath. No nausea or vomiting no other associated symptoms. He's been treating himself with oxycodone which he gets, "on the street" Past Medical History:  Diagnosis Date  . A-V fistula (HCC)    Placed on 10/27/2014 at [facility]; for future HD use.   . Asthma   . ESRD (end stage renal disease) (HCC)   . Heart attack (HCC)   . HIV (human immunodeficiency virus infection) (HCC)   . Staphylococcus aureus bacteremia with sepsis (HCC) 09/09/2014  . Systolic dysfunction, left ventricle 12/20/2014   EF 20%     Patient Active Problem List   Diagnosis Date Noted  . Acute asthma exacerbation 10/20/2017  . Pancreatitis 01/24/2017  . Inguinal adenopathy 07/18/2016  . AIDS (acquired immune deficiency syndrome) (HCC)   . Hyperbilirubinemia   . Chronic systolic heart failure (HCC) 01/13/2016  . Nonischemic cardiomyopathy (HCC) 11/27/2015  . Hemoptysis 09/11/2015  . Cough 09/11/2015  . Elevated troponin 09/11/2015  . Protein-calorie malnutrition, severe (HCC) 09/11/2015  . Pneumonia 09/11/2015  . Acute on chronic systolic CHF (congestive heart failure) (HCC) 12/20/2014  . A-V fistula (HCC) 12/18/2014  . Thrombocytopenia, acquired (HCC) 12/18/2014  . Other infectious complication of vascular dialysis catheter 12/17/2014  . 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 Pyrexia   . Vomiting and  diarrhea   . Macrocytic anemia 09/10/2014  . Cigarette smoker 09/10/2014  . Liver lesion 09/10/2014  . Asthma, chronic 09/10/2014  . History of MRSA infection 09/10/2014  . MRSA bacteremia 09/10/2014  . Staphylococcus aureus bacteremia with sepsis (HCC) 09/09/2014  . HIV (human immunodeficiency virus infection) (HCC) 09/08/2014  . ESRD on hemodialysis (HCC) 09/08/2014   patient reports he does not have AIDS. Last CD4 count approximately 2:30  Past Surgical History:  Procedure Laterality Date  . INSERTION OF DIALYSIS CATHETER Left 09/15/2014   Procedure: INSERTION OF DIALYSIS CATHETER;  Surgeon: Nada Libman, MD;  Location: [facility] OR;  Service: Vascular;  Laterality: Left;  . PORT A CATH REVISION    . TEE WITHOUT CARDIOVERSION N/A 09/14/2014   Procedure: TRANSESOPHAGEAL ECHOCARDIOGRAM (TEE);  Surgeon: Laurey Morale, MD;  Location: [facility] ENDOSCOPY;  Service: Cardiovascular;  Laterality: N/A;  will be at [facility] by mon        Home Medications    Prior to Admission medications   Medication Sig Start Date End Date Taking? Authorizing Provider  albuterol (PROVENTIL HFA;VENTOLIN HFA) 108 (90 BASE) MCG/ACT inhaler Inhale 1-2 puffs into the lungs every 6 (six) hours as needed for wheezing or shortness of breath.   Yes [provider]  darunavir-cobicistat (PREZCOBIX) 800-150 MG tablet Take by mouth. 12/28/17  Yes [provider]  dolutegravir (TIVICAY) 50 MG tablet Take 1 tablet by mouth daily. 12/28/17  Yes [provider]  emtricitabine (EMTRIVA) 200 MG capsule Take 200 mg by mouth 2 (two) times a  week. Taken after dialysis on Tuesday and Thursday   Yes [provider]  gabapentin (NEURONTIN) 100 MG capsule Take 100 mg by mouth 2 (two) times a week. On Tuesdays and Thursdays after dialysis 11/10/17 11/10/18 Yes [provider]  hydrocortisone 2.5 % cream Apply 1 application topically 2 (two) times daily.  10/24/17  Yes [provider]  metoprolol  succinate (TOPROL-XL) 50 MG 24 hr tablet Take 50 mg by mouth daily.    Yes [provider]  midodrine (PROAMATINE) 2.5 MG tablet Take 1 tablet by mouth 3 (three) times daily. Take 3 time a week before dialysis 02/15/18 05/16/18 Yes [provider]  oxyCODONE (OXY IR/ROXICODONE) 5 MG immediate release tablet Take 5 mg by mouth daily as needed for moderate pain.  04/23/17  Yes [provider]  ranitidine (ZANTAC) 150 MG tablet Take 1 tablet by mouth at bedtime.   Yes [provider]  raltegravir (ISENTRESS) 400 MG tablet Take 400 mg by mouth 2 (two) times daily.    [provider]  sulfamethoxazole-trimethoprim (BACTRIM,SEPTRA) 400-80 MG tablet Take by mouth. 01/21/18   [provider]    Family History Family History  Problem Relation Age of Onset  . Seizures Father     Social History Social History   Tobacco Use  . Smoking status: Current Every Day Smoker    Packs/day: 0.50    Years: 11.00    Pack years: 5.50    Types: Cigarettes    Start date: 01/28/2003  . Smokeless tobacco: Never Used  Substance Use Topics  . Alcohol use: No    Alcohol/week: 0.0 oz  . Drug use: Yes    Types: Marijuana     Allergies   Bee venom; Other; and Aspirin   Review of Systems Review of Systems  Endocrine:       Discharge from the right nipple  Musculoskeletal: Positive for myalgias.  Allergic/Immunologic: Positive for immunocompromised state.       Hemodialysis patient  All other systems reviewed and are negative.    Physical Exam Updated Vital Signs BP 117/72   Pulse 96   Temp 98.1 F (36.7 C) (Oral)   Resp 20   Ht 5\' 5"  (1.651 m)   Wt 56.7 kg (125 lb)   SpO2 97%   BMI 20.80 kg/m   Physical Exam  Constitutional: He is oriented to person, place, and time. He appears well-developed and well-nourished.  HENT:  Head: Normocephalic and atraumatic.  Eyes: Pupils are equal, round, and reactive to light. Conjunctivae are normal.  Neck:  Neck supple. No tracheal deviation present. No thyromegaly present.  Cardiovascular: Normal rate and regular rhythm.  No murmur heard. Pulmonary/Chest: Effort normal and breath sounds normal.  Chest there are no palpable breast masses. Nipples appear normal. No discharge noted.  Abdominal: Soft. Bowel sounds are normal. He exhibits no distension. There is no tenderness.  Musculoskeletal: Normal range of motion. He exhibits no edema or tenderness.  Bilateral lower extremity is mildly tender calves. No redness no swelling no warmth. Skin appears normal. Femoral pulses 2+ bilaterally. Popliteal pulses 2+ bilaterally and DP pulses and PT pulses 2+ bilaterally. Toes with good capillary refill.left upper extremity with dialysis fistula with good thrill.appears otherwise normal Right upper extremity without redness swelling or tenderness neurovascular intact  Neurological: He is alert and oriented to person, place, and time. No cranial nerve deficit. Coordination normal.  gait normal  Skin: Skin is warm and dry. No rash noted.  Psychiatric:  He has a normal mood and affect.  Nursing note and vitals reviewed.    ED Treatments / Results  Labs (all labs ordered are listed, but only abnormal results are displayed) Labs Reviewed  D-DIMER, QUANTITATIVE (NOT AT [facility])    EKG None  Radiology No results found.  Procedures Procedures (including critical care time)  Medications Ordered in ED Medications - No data to display  Results for orders placed or performed during the hospital encounter of 03/03/18  D-dimer, quantitative (not at [facility])  Result Value Ref Range   D-Dimer, Quant 3.53 (H) 0.00 - 0.50 ug/mL-FEU   No results found. Initial Impression / Assessment and Plan / ED Course  I have reviewed the triage vital signs and the nursing notes.  Pertinent labs & imaging results that were available during my care of the patient were reviewed by me and considered in my medical decision making  (see chart for details).     Pretest clinical suspicion for DVT is low.. D-dimer elevated. He received Lovenox 1 mg/kg ordered by me. Outpatient DVT study ordered for tomorrow. He reports pain improved after treatment with intravenous morphine. Suggest Tylenol or Advil for pain in light of fact the patient gets his opioids by illegal means  Final Clinical Impressions(s) / ED Diagnoses  Diagnosis bilateral leg pain Final diagnoses:  None    ED Discharge Orders    None       Doug Sou, MD 03/04/18 [phone]

## 2018-03-03 NOTE — Discharge Instructions (Signed)
We are concerned that you may have a blood clot in her legs. Call the number tomorrow to arrange to get studies done to check for blood clots.

## 2018-03-06 ENCOUNTER — Ambulatory Visit (HOSPITAL_COMMUNITY): Admission: RE | Admit: 2018-03-06 | Payer: Medicare Other | Source: Ambulatory Visit

## 2018-09-12 IMAGING — DX DG CHEST 2V
2 series · 2 of 2 positions shown · non-contrast
Comparison: 07/17/2016

CLINICAL DATA: Cough, fever, and wheezing.  History of HIV.

EXAM:
CHEST  2 VIEW

[chest pa]
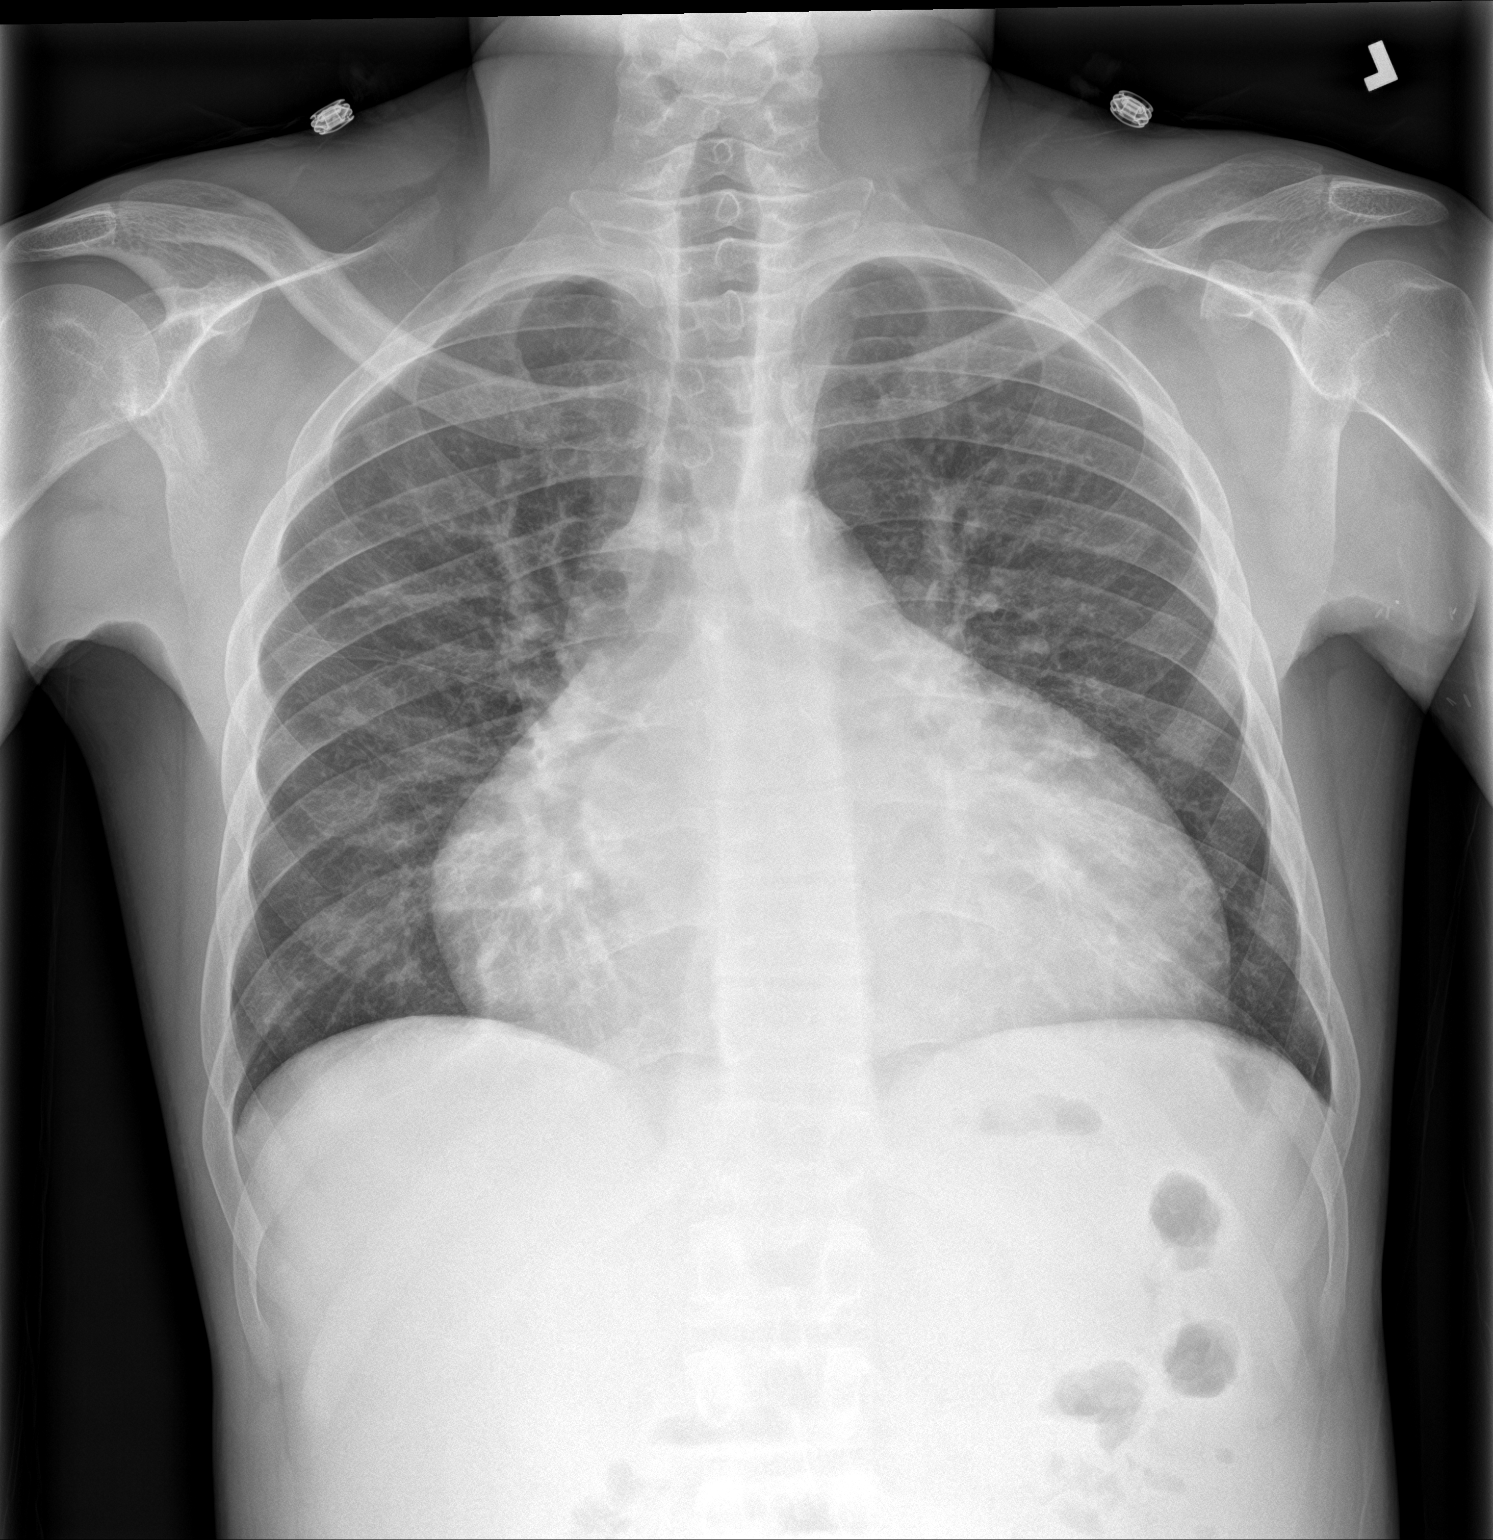

[chest lat]
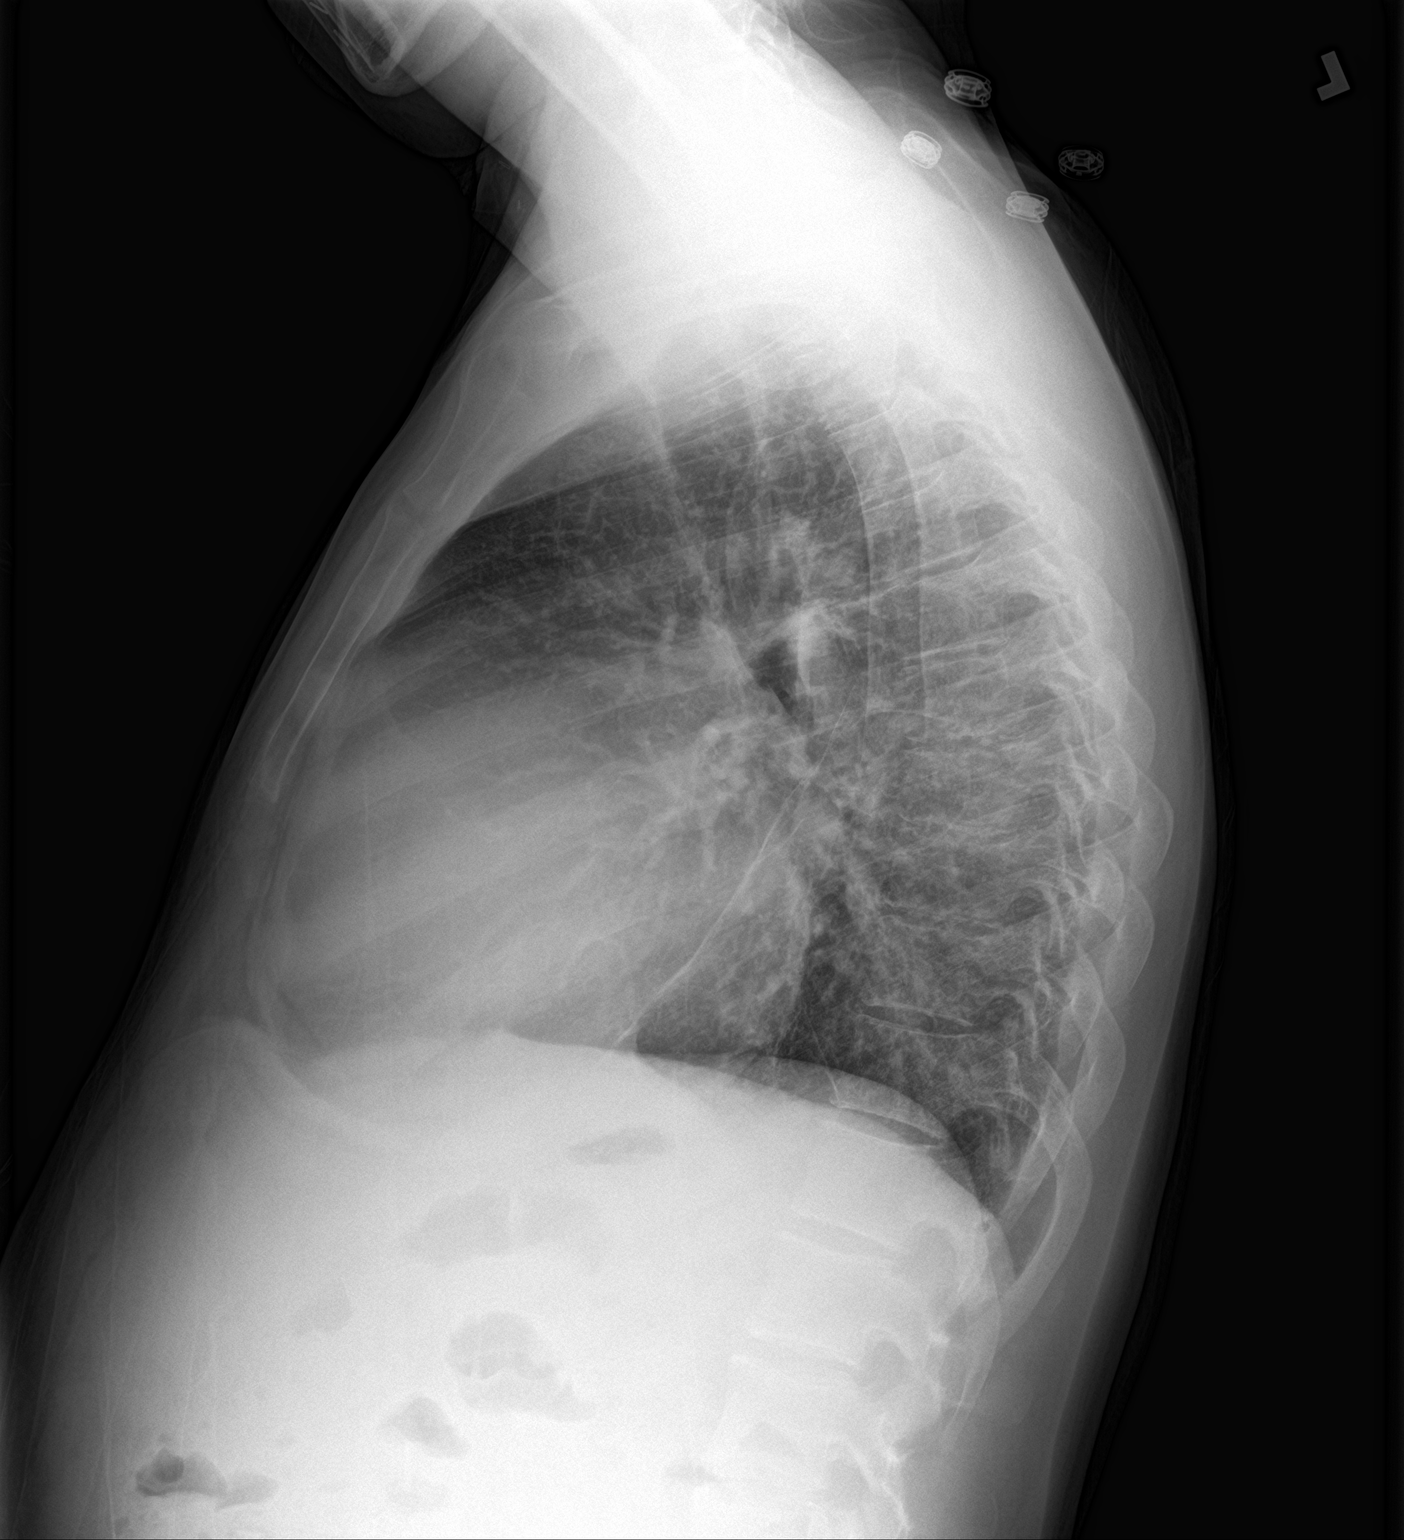

[2 of 2 positions shown; findings below may reference images not displayed]

FINDINGS: Cardiomegaly, progressed from prior exam. Mild vascular congestion.
Central peribronchial thickening. No confluent airspace disease.
Minimal fluid in the fissures without subpulmonic effusion. No
pneumothorax. No acute osseous abnormalities.
IMPRESSION: Progressive cardiomegaly with vascular congestion. Peribronchial
thickening may be pulmonary edema or bronchial inflammation. Minimal
fluid in the fissures. Findings can be seen with volume overload.

## 2018-10-10 IMAGING — DX DG CHEST 2V
2 series · 2 of 2 positions shown · non-contrast
Comparison: 10/20/2017.  Abdomen and pelvis CT dated 01/24/2017.

CLINICAL DATA: Chest pain and shortness of breath radiating to the
right since yesterday.

EXAM:
CHEST  2 VIEW

[chest pa]
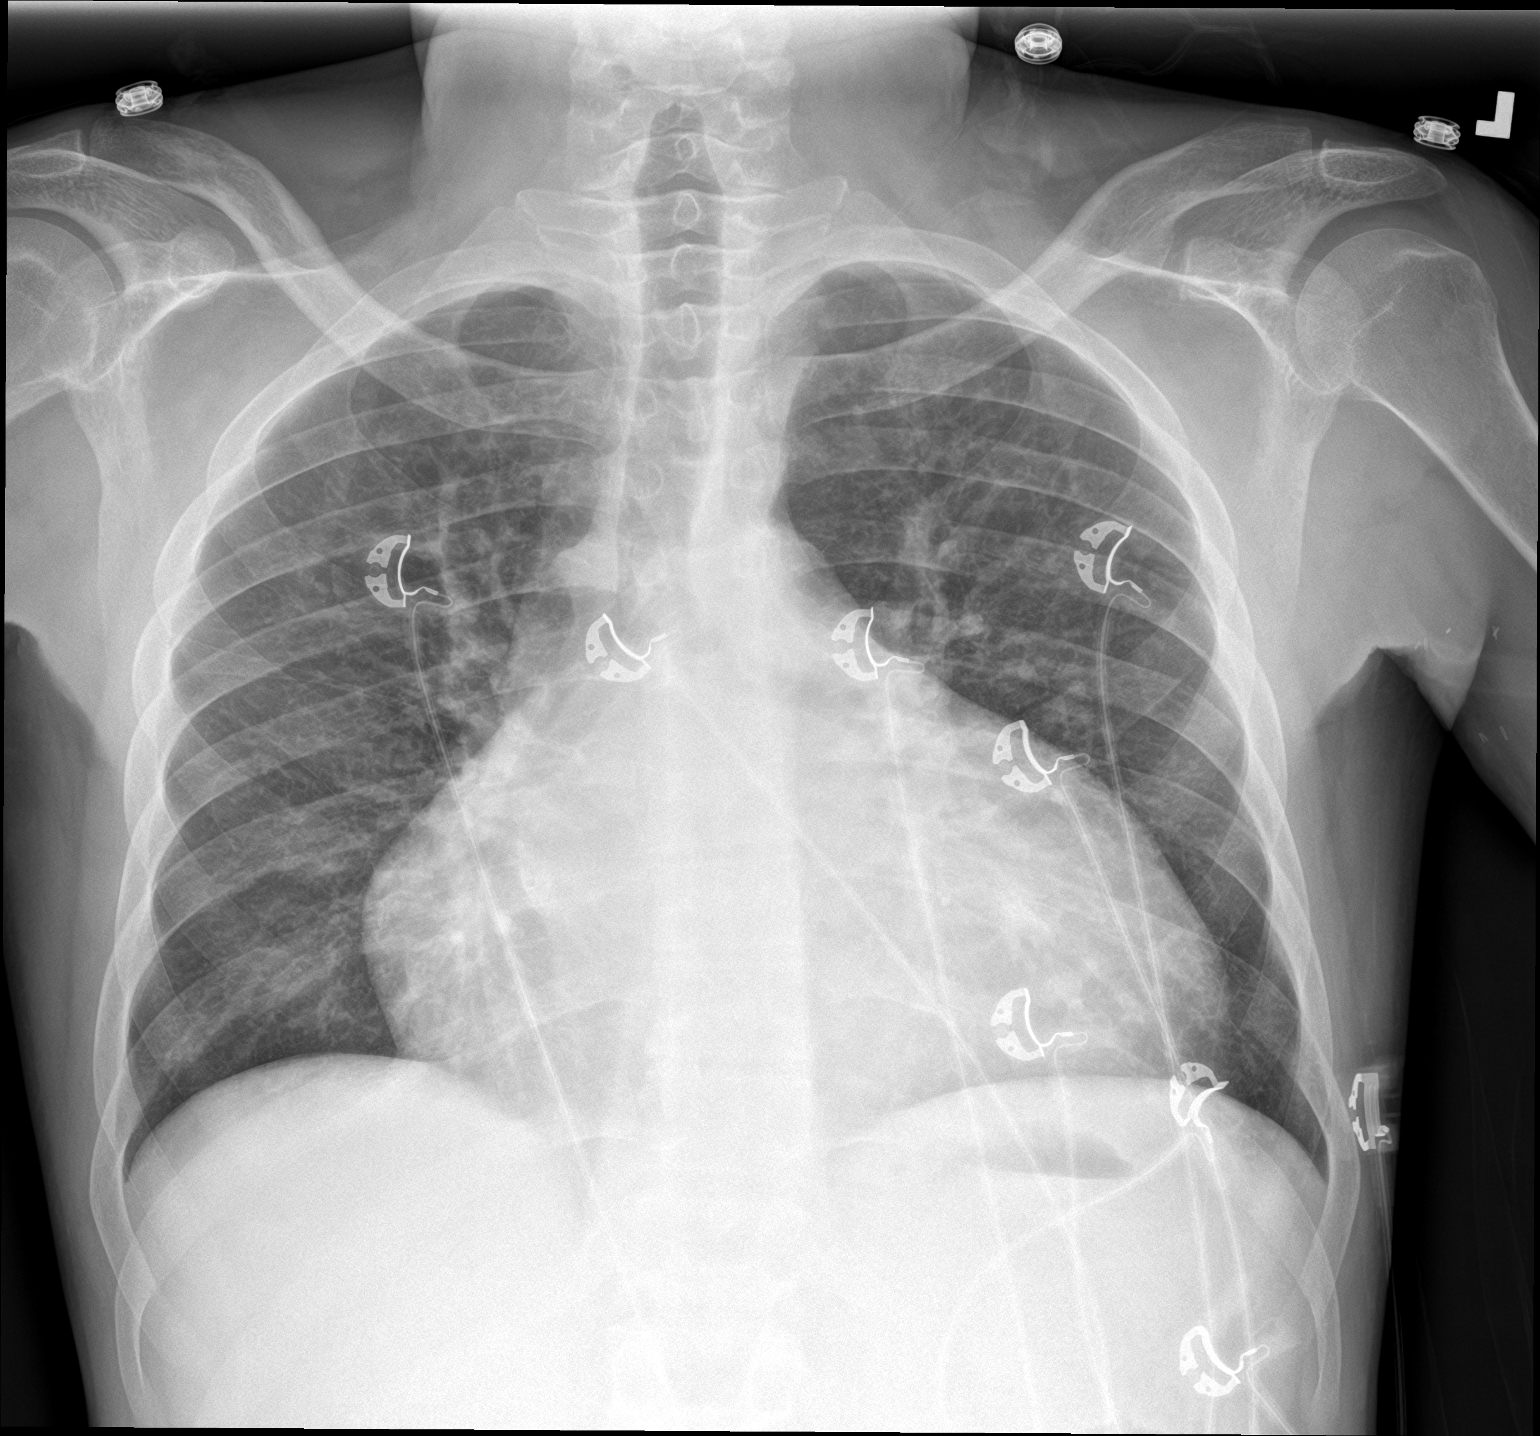

[chest lat]
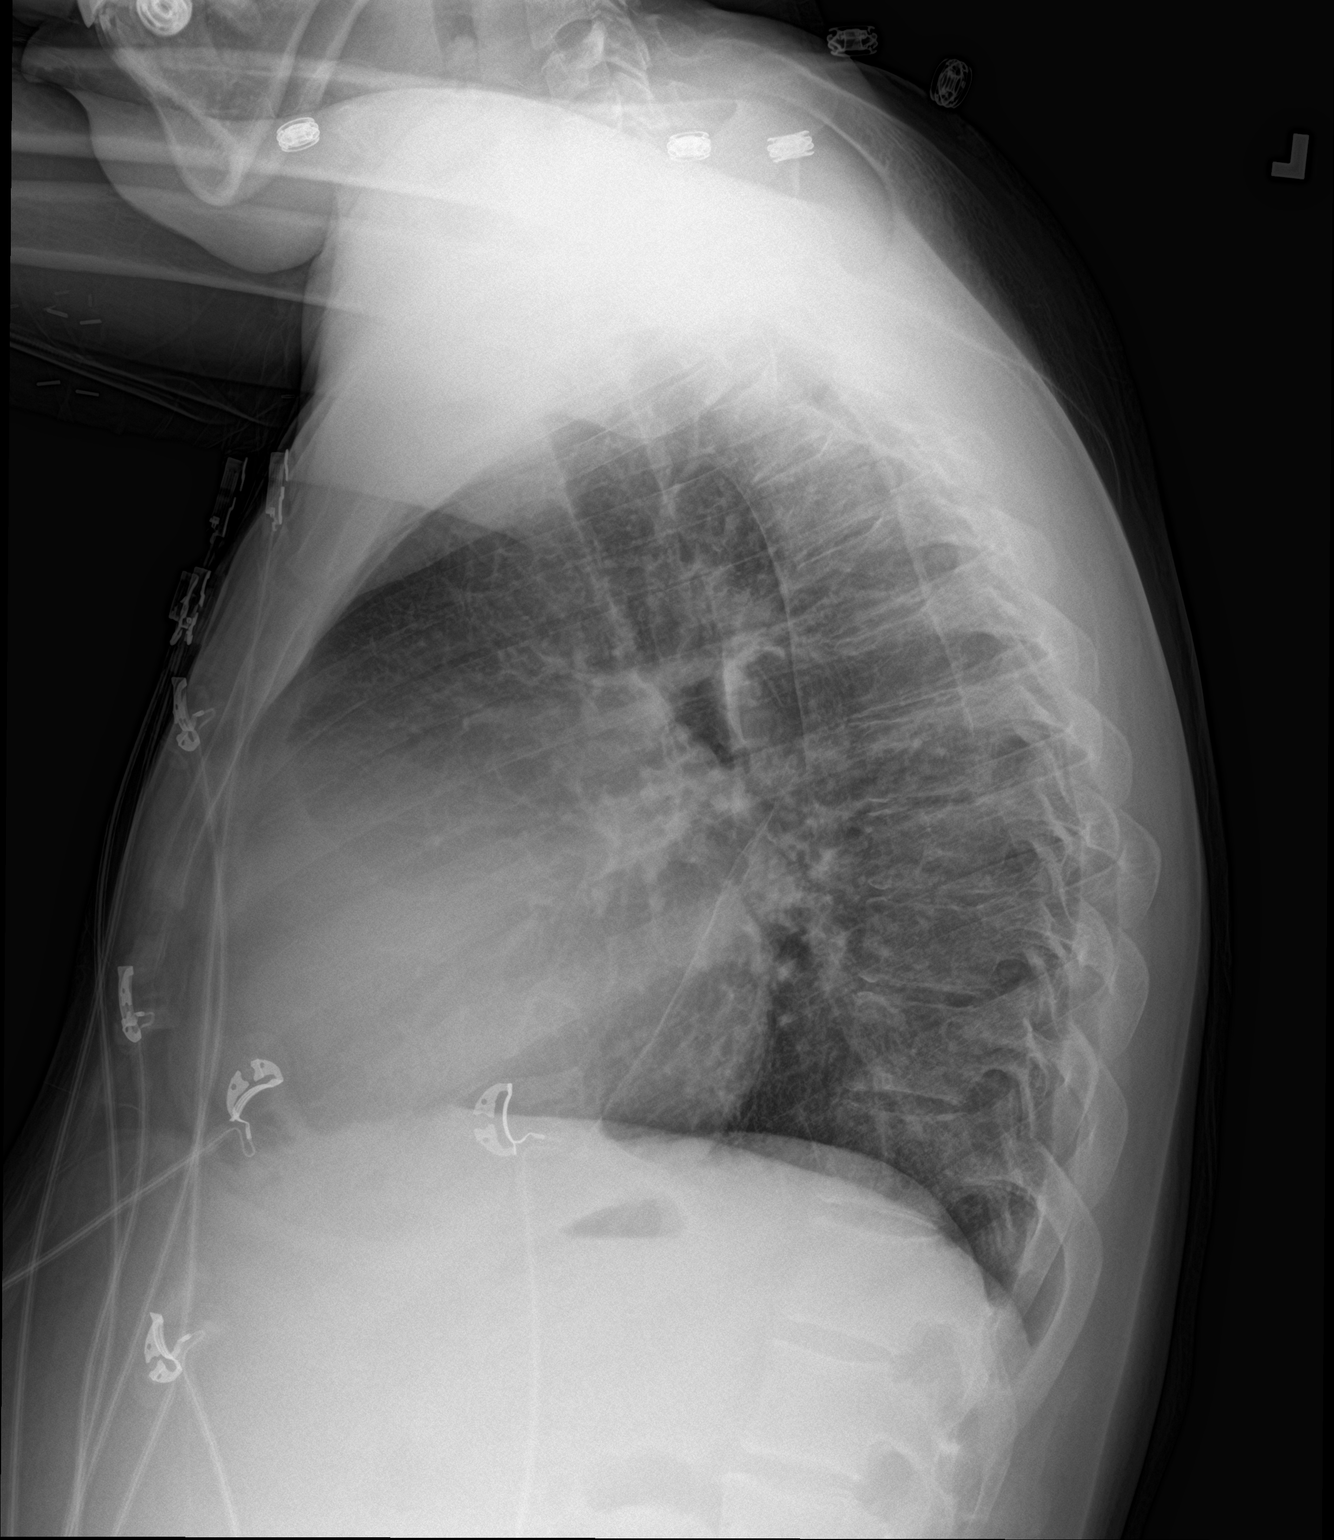

[2 of 2 positions shown; findings below may reference images not displayed]

FINDINGS: Stable enlarged cardiac silhouette with a globular configuration.
Mild prominence of the pulmonary vasculature and mild diffuse
peribronchial thickening are stable. No pleural fluid. Unremarkable
bones.
IMPRESSION: 1. Stable cardiomegaly with a globular configuration. This
appearance can be seen with a pericardial effusion. The patient had
a pericardial effusion on 01/24/2017.
2. Stable mild pulmonary vascular congestion and mild chronic
bronchitic changes.

## 2021-04-15 DEATH — deceased
